# Patient Record
Sex: Female | Born: 1965 | Race: White | Hispanic: No | Smoking: Never smoker
Health system: Southern US, Community
[De-identification: ages and names within clinical notes are randomized; demographics above are authoritative.]

## PROBLEM LIST (undated history)

## (undated) DIAGNOSIS — D509 Iron deficiency anemia, unspecified: Secondary | ICD-10-CM

## (undated) DIAGNOSIS — N184 Chronic kidney disease, stage 4 (severe): Secondary | ICD-10-CM

## (undated) DIAGNOSIS — T8859XA Other complications of anesthesia, initial encounter: Secondary | ICD-10-CM

## (undated) DIAGNOSIS — Z79899 Other long term (current) drug therapy: Secondary | ICD-10-CM

## (undated) DIAGNOSIS — E1169 Type 2 diabetes mellitus with other specified complication: Secondary | ICD-10-CM

## (undated) DIAGNOSIS — IMO0002 Reserved for concepts with insufficient information to code with codable children: Secondary | ICD-10-CM

## (undated) DIAGNOSIS — Z9289 Personal history of other medical treatment: Secondary | ICD-10-CM

## (undated) DIAGNOSIS — I501 Left ventricular failure: Secondary | ICD-10-CM

## (undated) DIAGNOSIS — K589 Irritable bowel syndrome without diarrhea: Secondary | ICD-10-CM

## (undated) DIAGNOSIS — R509 Fever, unspecified: Secondary | ICD-10-CM

## (undated) DIAGNOSIS — L03119 Cellulitis of unspecified part of limb: Secondary | ICD-10-CM

## (undated) DIAGNOSIS — N3000 Acute cystitis without hematuria: Secondary | ICD-10-CM

## (undated) DIAGNOSIS — R011 Cardiac murmur, unspecified: Secondary | ICD-10-CM

## (undated) DIAGNOSIS — G909 Disorder of the autonomic nervous system, unspecified: Secondary | ICD-10-CM

## (undated) DIAGNOSIS — F411 Generalized anxiety disorder: Secondary | ICD-10-CM

## (undated) DIAGNOSIS — R609 Edema, unspecified: Secondary | ICD-10-CM

## (undated) DIAGNOSIS — L02619 Cutaneous abscess of unspecified foot: Secondary | ICD-10-CM

## (undated) DIAGNOSIS — E119 Type 2 diabetes mellitus without complications: Secondary | ICD-10-CM

## (undated) DIAGNOSIS — G6181 Chronic inflammatory demyelinating polyneuritis: Secondary | ICD-10-CM

## (undated) DIAGNOSIS — K219 Gastro-esophageal reflux disease without esophagitis: Secondary | ICD-10-CM

## (undated) DIAGNOSIS — F502 Bulimia nervosa, unspecified: Secondary | ICD-10-CM

## (undated) DIAGNOSIS — E785 Hyperlipidemia, unspecified: Secondary | ICD-10-CM

## (undated) DIAGNOSIS — T4145XA Adverse effect of unspecified anesthetic, initial encounter: Secondary | ICD-10-CM

## (undated) DIAGNOSIS — R197 Diarrhea, unspecified: Secondary | ICD-10-CM

## (undated) DIAGNOSIS — I270 Primary pulmonary hypertension: Secondary | ICD-10-CM

## (undated) DIAGNOSIS — J189 Pneumonia, unspecified organism: Secondary | ICD-10-CM

## (undated) DIAGNOSIS — R300 Dysuria: Secondary | ICD-10-CM

## (undated) DIAGNOSIS — G2581 Restless legs syndrome: Secondary | ICD-10-CM

## (undated) DIAGNOSIS — M6281 Muscle weakness (generalized): Secondary | ICD-10-CM

## (undated) DIAGNOSIS — F341 Dysthymic disorder: Secondary | ICD-10-CM

## (undated) DIAGNOSIS — E11319 Type 2 diabetes mellitus with unspecified diabetic retinopathy without macular edema: Secondary | ICD-10-CM

## (undated) DIAGNOSIS — N200 Calculus of kidney: Secondary | ICD-10-CM

## (undated) DIAGNOSIS — R002 Palpitations: Secondary | ICD-10-CM

## (undated) DIAGNOSIS — I10 Essential (primary) hypertension: Secondary | ICD-10-CM

## (undated) HISTORY — DX: Left ventricular failure, unspecified: I50.1

## (undated) HISTORY — DX: Primary pulmonary hypertension: I27.0

## (undated) HISTORY — DX: Gastro-esophageal reflux disease without esophagitis: K21.9

## (undated) HISTORY — DX: Restless legs syndrome: G25.81

## (undated) HISTORY — DX: Type 2 diabetes mellitus with other specified complication: E11.69

## (undated) HISTORY — DX: Dysuria: R30.0

## (undated) HISTORY — DX: Reserved for concepts with insufficient information to code with codable children: IMO0002

## (undated) HISTORY — DX: Dysthymic disorder: F34.1

## (undated) HISTORY — DX: Hyperlipidemia, unspecified: E78.5

## (undated) HISTORY — DX: Disorders of magnesium metabolism, unspecified: E83.40

## (undated) HISTORY — DX: Other long term (current) drug therapy: Z79.899

## (undated) HISTORY — DX: Muscle weakness (generalized): M62.81

## (undated) HISTORY — DX: Disorder of the autonomic nervous system, unspecified: G90.9

## (undated) HISTORY — DX: Type 2 diabetes mellitus without complications: E11.9

## (undated) HISTORY — DX: Palpitations: R00.2

## (undated) HISTORY — PX: DILATION AND CURETTAGE OF UTERUS: SHX78

## (undated) HISTORY — DX: Irritable bowel syndrome, unspecified: K58.9

## (undated) HISTORY — PX: TONSILLECTOMY AND ADENOIDECTOMY: SUR1326

## (undated) HISTORY — DX: Bulimia nervosa, unspecified: F50.20

## (undated) HISTORY — DX: Generalized anxiety disorder: F41.1

## (undated) HISTORY — PX: APPENDECTOMY: SHX54

## (undated) HISTORY — PX: EYE SURGERY: SHX253

## (undated) HISTORY — DX: Calculus of kidney: N20.0

## (undated) HISTORY — DX: Edema, unspecified: R60.9

## (undated) HISTORY — DX: Diarrhea, unspecified: R19.7

## (undated) HISTORY — DX: Acute cystitis without hematuria: N30.00

## (undated) HISTORY — DX: Type 2 diabetes mellitus with unspecified diabetic retinopathy without macular edema: E11.319

## (undated) HISTORY — DX: Essential (primary) hypertension: I10

## (undated) HISTORY — DX: Chronic inflammatory demyelinating polyneuritis: G61.81

## (undated) HISTORY — DX: Bulimia nervosa: F50.2

---
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
## 1997-04-09 ENCOUNTER — Inpatient Hospital Stay (HOSPITAL_COMMUNITY): Admission: AD | Admit: 1997-04-09 | Discharge: 1997-04-13 | Payer: Self-pay | Admitting: Obstetrics and Gynecology

## 1997-04-13 ENCOUNTER — Encounter (HOSPITAL_COMMUNITY): Admission: RE | Admit: 1997-04-13 | Discharge: 1997-07-12 | Payer: Self-pay | Admitting: Obstetrics and Gynecology

## 1997-08-20 ENCOUNTER — Inpatient Hospital Stay (HOSPITAL_COMMUNITY): Admission: AD | Admit: 1997-08-20 | Discharge: 1997-08-20 | Payer: Self-pay | Admitting: Obstetrics and Gynecology

## 1997-09-01 ENCOUNTER — Inpatient Hospital Stay (HOSPITAL_COMMUNITY): Admission: AD | Admit: 1997-09-01 | Discharge: 1997-09-01 | Payer: Self-pay | Admitting: *Deleted

## 1997-12-01 ENCOUNTER — Ambulatory Visit (HOSPITAL_COMMUNITY): Admission: RE | Admit: 1997-12-01 | Discharge: 1997-12-01 | Payer: Self-pay | Admitting: Obstetrics & Gynecology

## 1998-01-25 ENCOUNTER — Emergency Department (HOSPITAL_COMMUNITY): Admission: EM | Admit: 1998-01-25 | Discharge: 1998-01-25 | Payer: Self-pay | Admitting: Emergency Medicine

## 1998-07-22 ENCOUNTER — Observation Stay (HOSPITAL_COMMUNITY): Admission: AD | Admit: 1998-07-22 | Discharge: 1998-07-23 | Payer: Self-pay | Admitting: Family Medicine

## 1998-11-08 ENCOUNTER — Ambulatory Visit (HOSPITAL_COMMUNITY): Admission: AD | Admit: 1998-11-08 | Discharge: 1998-11-08 | Payer: Self-pay | Admitting: *Deleted

## 1998-11-08 ENCOUNTER — Encounter: Payer: Self-pay | Admitting: Obstetrics and Gynecology

## 1998-11-08 ENCOUNTER — Encounter (INDEPENDENT_AMBULATORY_CARE_PROVIDER_SITE_OTHER): Payer: Self-pay

## 1999-05-28 ENCOUNTER — Other Ambulatory Visit: Admission: RE | Admit: 1999-05-28 | Discharge: 1999-05-28 | Payer: Self-pay | Admitting: Obstetrics and Gynecology

## 1999-06-09 ENCOUNTER — Ambulatory Visit (HOSPITAL_COMMUNITY): Admission: RE | Admit: 1999-06-09 | Discharge: 1999-06-09 | Payer: Self-pay | Admitting: Obstetrics and Gynecology

## 1999-06-26 ENCOUNTER — Ambulatory Visit (HOSPITAL_COMMUNITY): Admission: RE | Admit: 1999-06-26 | Discharge: 1999-06-26 | Payer: Self-pay | Admitting: *Deleted

## 1999-06-26 ENCOUNTER — Encounter: Payer: Self-pay | Admitting: *Deleted

## 1999-08-13 ENCOUNTER — Encounter: Payer: Self-pay | Admitting: Obstetrics and Gynecology

## 1999-08-13 ENCOUNTER — Ambulatory Visit (HOSPITAL_COMMUNITY): Admission: RE | Admit: 1999-08-13 | Discharge: 1999-08-13 | Payer: Self-pay | Admitting: Obstetrics and Gynecology

## 1999-09-18 ENCOUNTER — Ambulatory Visit (HOSPITAL_COMMUNITY): Admission: RE | Admit: 1999-09-18 | Discharge: 1999-09-18 | Payer: Self-pay | Admitting: *Deleted

## 1999-09-18 ENCOUNTER — Encounter: Admission: RE | Admit: 1999-09-18 | Discharge: 1999-09-18 | Payer: Self-pay | Admitting: Obstetrics

## 1999-09-19 ENCOUNTER — Encounter: Admission: RE | Admit: 1999-09-19 | Discharge: 1999-12-18 | Payer: Self-pay | Admitting: Obstetrics

## 1999-09-23 ENCOUNTER — Inpatient Hospital Stay (HOSPITAL_COMMUNITY): Admission: RE | Admit: 1999-09-23 | Discharge: 1999-09-23 | Payer: Self-pay | Admitting: *Deleted

## 1999-09-24 ENCOUNTER — Inpatient Hospital Stay (HOSPITAL_COMMUNITY): Admission: AD | Admit: 1999-09-24 | Discharge: 1999-09-24 | Payer: Self-pay | Admitting: Obstetrics

## 1999-10-07 ENCOUNTER — Encounter (HOSPITAL_COMMUNITY): Admission: RE | Admit: 1999-10-07 | Discharge: 1999-11-10 | Payer: Self-pay | Admitting: *Deleted

## 1999-10-10 ENCOUNTER — Encounter: Payer: Self-pay | Admitting: *Deleted

## 1999-10-31 ENCOUNTER — Inpatient Hospital Stay (HOSPITAL_COMMUNITY): Admission: AD | Admit: 1999-10-31 | Discharge: 1999-11-03 | Payer: Self-pay | Admitting: *Deleted

## 1999-10-31 ENCOUNTER — Encounter: Payer: Self-pay | Admitting: *Deleted

## 1999-11-03 ENCOUNTER — Encounter: Payer: Self-pay | Admitting: *Deleted

## 1999-11-08 ENCOUNTER — Encounter (INDEPENDENT_AMBULATORY_CARE_PROVIDER_SITE_OTHER): Payer: Self-pay | Admitting: Specialist

## 1999-11-08 ENCOUNTER — Inpatient Hospital Stay (HOSPITAL_COMMUNITY): Admission: AD | Admit: 1999-11-08 | Discharge: 1999-11-12 | Payer: Self-pay | Admitting: *Deleted

## 1999-11-13 ENCOUNTER — Encounter (HOSPITAL_COMMUNITY): Admission: RE | Admit: 1999-11-13 | Discharge: 1999-12-09 | Payer: Self-pay | Admitting: *Deleted

## 1999-11-17 ENCOUNTER — Inpatient Hospital Stay (HOSPITAL_COMMUNITY): Admission: AD | Admit: 1999-11-17 | Discharge: 1999-11-17 | Payer: Self-pay | Admitting: Obstetrics & Gynecology

## 1999-12-30 ENCOUNTER — Encounter (INDEPENDENT_AMBULATORY_CARE_PROVIDER_SITE_OTHER): Payer: Self-pay

## 1999-12-30 ENCOUNTER — Inpatient Hospital Stay (HOSPITAL_COMMUNITY): Admission: AD | Admit: 1999-12-30 | Discharge: 1999-12-30 | Payer: Self-pay | Admitting: *Deleted

## 2000-03-09 ENCOUNTER — Encounter (INDEPENDENT_AMBULATORY_CARE_PROVIDER_SITE_OTHER): Payer: Self-pay | Admitting: Specialist

## 2000-03-09 ENCOUNTER — Inpatient Hospital Stay (HOSPITAL_COMMUNITY): Admission: AD | Admit: 2000-03-09 | Discharge: 2000-03-09 | Payer: Self-pay | Admitting: *Deleted

## 2000-03-11 ENCOUNTER — Inpatient Hospital Stay (HOSPITAL_COMMUNITY): Admission: AD | Admit: 2000-03-11 | Discharge: 2000-03-11 | Payer: Self-pay | Admitting: Obstetrics & Gynecology

## 2000-03-11 ENCOUNTER — Encounter: Payer: Self-pay | Admitting: Obstetrics & Gynecology

## 2002-11-02 ENCOUNTER — Encounter: Payer: Self-pay | Admitting: Emergency Medicine

## 2002-11-02 ENCOUNTER — Emergency Department (HOSPITAL_COMMUNITY): Admission: EM | Admit: 2002-11-02 | Discharge: 2002-11-02 | Payer: Self-pay | Admitting: Emergency Medicine

## 2003-03-16 ENCOUNTER — Ambulatory Visit (HOSPITAL_COMMUNITY): Admission: RE | Admit: 2003-03-16 | Discharge: 2003-03-16 | Payer: Self-pay | Admitting: Ophthalmology

## 2003-03-16 ENCOUNTER — Ambulatory Visit (HOSPITAL_BASED_OUTPATIENT_CLINIC_OR_DEPARTMENT_OTHER): Admission: RE | Admit: 2003-03-16 | Discharge: 2003-03-16 | Payer: Self-pay | Admitting: Ophthalmology

## 2003-05-18 ENCOUNTER — Ambulatory Visit (HOSPITAL_BASED_OUTPATIENT_CLINIC_OR_DEPARTMENT_OTHER): Admission: RE | Admit: 2003-05-18 | Discharge: 2003-05-18 | Payer: Self-pay | Admitting: Ophthalmology

## 2003-08-14 ENCOUNTER — Other Ambulatory Visit: Admission: RE | Admit: 2003-08-14 | Discharge: 2003-08-14 | Payer: Self-pay | Admitting: Family Medicine

## 2003-08-27 ENCOUNTER — Encounter: Admission: RE | Admit: 2003-08-27 | Discharge: 2003-08-27 | Payer: Self-pay | Admitting: Family Medicine

## 2003-11-22 ENCOUNTER — Encounter (INDEPENDENT_AMBULATORY_CARE_PROVIDER_SITE_OTHER): Payer: Self-pay | Admitting: Specialist

## 2003-11-22 ENCOUNTER — Inpatient Hospital Stay (HOSPITAL_COMMUNITY): Admission: AD | Admit: 2003-11-22 | Discharge: 2003-11-24 | Payer: Self-pay | Admitting: Obstetrics & Gynecology

## 2004-04-02 ENCOUNTER — Emergency Department (HOSPITAL_COMMUNITY): Admission: EM | Admit: 2004-04-02 | Discharge: 2004-04-02 | Payer: Self-pay | Admitting: Family Medicine

## 2005-05-21 ENCOUNTER — Ambulatory Visit: Payer: Self-pay | Admitting: Internal Medicine

## 2005-05-22 ENCOUNTER — Ambulatory Visit: Payer: Self-pay | Admitting: Endocrinology

## 2005-06-01 ENCOUNTER — Ambulatory Visit: Payer: Self-pay | Admitting: Internal Medicine

## 2005-06-10 ENCOUNTER — Ambulatory Visit: Payer: Self-pay | Admitting: Internal Medicine

## 2005-06-12 ENCOUNTER — Ambulatory Visit: Payer: Self-pay | Admitting: Cardiology

## 2005-09-03 ENCOUNTER — Ambulatory Visit (HOSPITAL_COMMUNITY): Admission: RE | Admit: 2005-09-03 | Discharge: 2005-09-03 | Payer: Self-pay | Admitting: Family Medicine

## 2005-09-09 ENCOUNTER — Ambulatory Visit: Payer: Self-pay | Admitting: Endocrinology

## 2005-09-09 ENCOUNTER — Ambulatory Visit: Payer: Self-pay | Admitting: Internal Medicine

## 2005-10-06 ENCOUNTER — Ambulatory Visit: Payer: Self-pay | Admitting: Internal Medicine

## 2005-10-08 ENCOUNTER — Ambulatory Visit: Payer: Self-pay | Admitting: Internal Medicine

## 2005-12-16 ENCOUNTER — Emergency Department (HOSPITAL_COMMUNITY): Admission: EM | Admit: 2005-12-16 | Discharge: 2005-12-16 | Payer: Self-pay | Admitting: Family Medicine

## 2005-12-17 ENCOUNTER — Emergency Department (HOSPITAL_COMMUNITY): Admission: EM | Admit: 2005-12-17 | Discharge: 2005-12-17 | Payer: Self-pay | Admitting: Family Medicine

## 2006-08-08 ENCOUNTER — Emergency Department (HOSPITAL_COMMUNITY): Admission: EM | Admit: 2006-08-08 | Discharge: 2006-08-08 | Payer: Self-pay | Admitting: Emergency Medicine

## 2006-09-08 ENCOUNTER — Encounter: Payer: Self-pay | Admitting: *Deleted

## 2006-09-08 DIAGNOSIS — F3289 Other specified depressive episodes: Secondary | ICD-10-CM | POA: Insufficient documentation

## 2006-09-08 DIAGNOSIS — F32A Depression, unspecified: Secondary | ICD-10-CM | POA: Insufficient documentation

## 2006-09-08 DIAGNOSIS — K8020 Calculus of gallbladder without cholecystitis without obstruction: Secondary | ICD-10-CM | POA: Insufficient documentation

## 2006-09-08 DIAGNOSIS — F329 Major depressive disorder, single episode, unspecified: Secondary | ICD-10-CM | POA: Insufficient documentation

## 2006-09-08 DIAGNOSIS — G629 Polyneuropathy, unspecified: Secondary | ICD-10-CM | POA: Insufficient documentation

## 2006-12-02 ENCOUNTER — Ambulatory Visit: Payer: Self-pay | Admitting: Vascular Surgery

## 2006-12-02 ENCOUNTER — Encounter (INDEPENDENT_AMBULATORY_CARE_PROVIDER_SITE_OTHER): Payer: Self-pay | Admitting: Family Medicine

## 2006-12-02 ENCOUNTER — Ambulatory Visit (HOSPITAL_COMMUNITY): Admission: RE | Admit: 2006-12-02 | Discharge: 2006-12-02 | Payer: Self-pay | Admitting: Family Medicine

## 2007-04-04 ENCOUNTER — Ambulatory Visit: Payer: Self-pay | Admitting: Internal Medicine

## 2007-04-04 ENCOUNTER — Telehealth: Payer: Self-pay | Admitting: Internal Medicine

## 2007-04-04 DIAGNOSIS — R609 Edema, unspecified: Secondary | ICD-10-CM | POA: Insufficient documentation

## 2007-04-04 DIAGNOSIS — E1165 Type 2 diabetes mellitus with hyperglycemia: Secondary | ICD-10-CM | POA: Insufficient documentation

## 2007-04-04 DIAGNOSIS — IMO0002 Reserved for concepts with insufficient information to code with codable children: Secondary | ICD-10-CM | POA: Insufficient documentation

## 2007-04-04 DIAGNOSIS — I10 Essential (primary) hypertension: Secondary | ICD-10-CM | POA: Insufficient documentation

## 2007-04-12 LAB — CONVERTED CEMR LAB
ALT: 24 units/L (ref 0–35)
Albumin: 3.7 g/dL (ref 3.5–5.2)
Alkaline Phosphatase: 86 units/L (ref 39–117)
Basophils Absolute: 0.1 10*3/uL (ref 0.0–0.1)
Basophils Relative: 1.1 % — ABNORMAL HIGH (ref 0.0–1.0)
Calcium: 9 mg/dL (ref 8.4–10.5)
Chloride: 95 meq/L — ABNORMAL LOW (ref 96–112)
GFR calc Af Amer: 173 mL/min
Glucose, Bld: 410 mg/dL — ABNORMAL HIGH (ref 70–99)
HCT: 38.6 % (ref 36.0–46.0)
Hemoglobin: 12.2 g/dL (ref 12.0–15.0)
Hgb A1c MFr Bld: 17.4 % — ABNORMAL HIGH (ref 4.6–6.0)
MCHC: 31.7 g/dL (ref 30.0–36.0)
MCV: 79.5 fL (ref 78.0–100.0)
Microalb Creat Ratio: 240.2 mg/g — ABNORMAL HIGH (ref 0.0–30.0)
Monocytes Absolute: 0.4 10*3/uL (ref 0.2–0.7)
Neutro Abs: 4.6 10*3/uL (ref 1.4–7.7)
Neutrophils Relative %: 64.8 % (ref 43.0–77.0)
Platelets: 210 10*3/uL (ref 150–400)
Potassium: 4.6 meq/L (ref 3.5–5.1)
RDW: 16.4 % — ABNORMAL HIGH (ref 11.5–14.6)
Sodium: 133 meq/L — ABNORMAL LOW (ref 135–145)
WBC: 7 10*3/uL (ref 4.5–10.5)

## 2007-07-05 ENCOUNTER — Other Ambulatory Visit: Admission: RE | Admit: 2007-07-05 | Discharge: 2007-07-05 | Payer: Self-pay | Admitting: Gynecology

## 2007-07-25 ENCOUNTER — Encounter: Admission: RE | Admit: 2007-07-25 | Discharge: 2007-07-25 | Payer: Self-pay | Admitting: Gynecology

## 2007-09-22 ENCOUNTER — Emergency Department (HOSPITAL_COMMUNITY): Admission: EM | Admit: 2007-09-22 | Discharge: 2007-09-22 | Payer: Self-pay | Admitting: Family Medicine

## 2007-12-24 ENCOUNTER — Emergency Department (HOSPITAL_COMMUNITY): Admission: EM | Admit: 2007-12-24 | Discharge: 2007-12-24 | Payer: Self-pay | Admitting: Emergency Medicine

## 2008-01-27 DIAGNOSIS — J189 Pneumonia, unspecified organism: Secondary | ICD-10-CM

## 2008-01-27 HISTORY — DX: Pneumonia, unspecified organism: J18.9

## 2008-01-27 HISTORY — PX: PARS PLANA VITRECTOMY: SHX2166

## 2008-01-27 HISTORY — PX: INCISION AND DRAINAGE OF WOUND: SHX1803

## 2008-02-03 ENCOUNTER — Encounter (HOSPITAL_BASED_OUTPATIENT_CLINIC_OR_DEPARTMENT_OTHER): Admission: RE | Admit: 2008-02-03 | Discharge: 2008-04-30 | Payer: Self-pay | Admitting: Internal Medicine

## 2008-02-11 ENCOUNTER — Ambulatory Visit (HOSPITAL_COMMUNITY): Admission: RE | Admit: 2008-02-11 | Discharge: 2008-02-11 | Payer: Self-pay | Admitting: Internal Medicine

## 2008-03-27 ENCOUNTER — Ambulatory Visit: Payer: Self-pay | Admitting: Infectious Disease

## 2008-03-27 ENCOUNTER — Inpatient Hospital Stay (HOSPITAL_COMMUNITY): Admission: EM | Admit: 2008-03-27 | Discharge: 2008-03-29 | Payer: Self-pay | Admitting: Emergency Medicine

## 2008-04-04 ENCOUNTER — Emergency Department (HOSPITAL_COMMUNITY): Admission: EM | Admit: 2008-04-04 | Discharge: 2008-04-04 | Payer: Self-pay | Admitting: Emergency Medicine

## 2008-04-14 ENCOUNTER — Emergency Department (HOSPITAL_COMMUNITY): Admission: EM | Admit: 2008-04-14 | Discharge: 2008-04-15 | Payer: Self-pay | Admitting: Emergency Medicine

## 2008-04-15 ENCOUNTER — Emergency Department (HOSPITAL_COMMUNITY): Admission: EM | Admit: 2008-04-15 | Discharge: 2008-04-15 | Payer: Self-pay | Admitting: Emergency Medicine

## 2008-05-01 ENCOUNTER — Emergency Department (HOSPITAL_COMMUNITY): Admission: EM | Admit: 2008-05-01 | Discharge: 2008-05-01 | Payer: Self-pay | Admitting: *Deleted

## 2008-05-04 ENCOUNTER — Emergency Department (HOSPITAL_COMMUNITY): Admission: EM | Admit: 2008-05-04 | Discharge: 2008-05-04 | Payer: Self-pay | Admitting: Emergency Medicine

## 2008-06-06 ENCOUNTER — Encounter: Admission: RE | Admit: 2008-06-06 | Discharge: 2008-06-06 | Payer: Self-pay | Admitting: Orthopedic Surgery

## 2008-10-30 ENCOUNTER — Emergency Department (HOSPITAL_COMMUNITY): Admission: EM | Admit: 2008-10-30 | Discharge: 2008-10-30 | Payer: Self-pay | Admitting: Emergency Medicine

## 2009-01-28 ENCOUNTER — Inpatient Hospital Stay (HOSPITAL_COMMUNITY): Admission: EM | Admit: 2009-01-28 | Discharge: 2009-01-30 | Payer: Self-pay | Admitting: Emergency Medicine

## 2009-03-26 ENCOUNTER — Emergency Department (HOSPITAL_COMMUNITY): Admission: EM | Admit: 2009-03-26 | Discharge: 2009-03-26 | Payer: Self-pay | Admitting: Emergency Medicine

## 2009-05-26 IMAGING — CT CT ANGIO CHEST
2 of 6 series · 18 of 36 positions shown · IV contrast (agent unspecified)
Comparison: Chest radiograph 05/04/2008

CLINICAL DATA: Shortness of breath

CT ANGIOGRAPHY CHEST
TECHNIQUE: Multidetector CT imaging of the chest was performed
using the standard protocol during bolus administration of
intravenous contrast. Multiplanar CT image reconstructions
including MIPs were obtained to evaluate the vascular anatomy.
Contrast: 80 ml [id number]

[Series 2: pe · axial · 0.64mm/px · z∈[-251,-51]mm · 17 of 181 slices shown]
[im 11/181  lung]
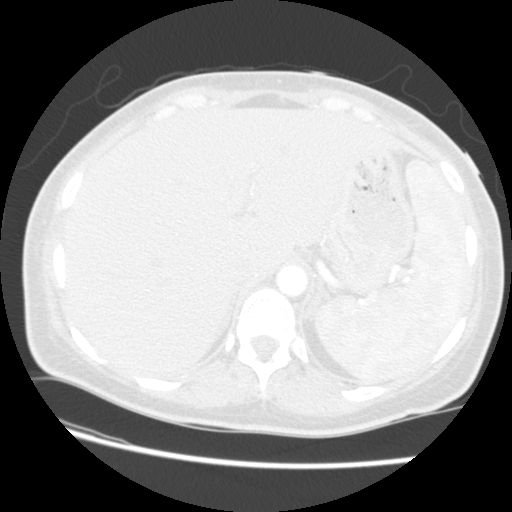
[im 21/181  mediastinal]
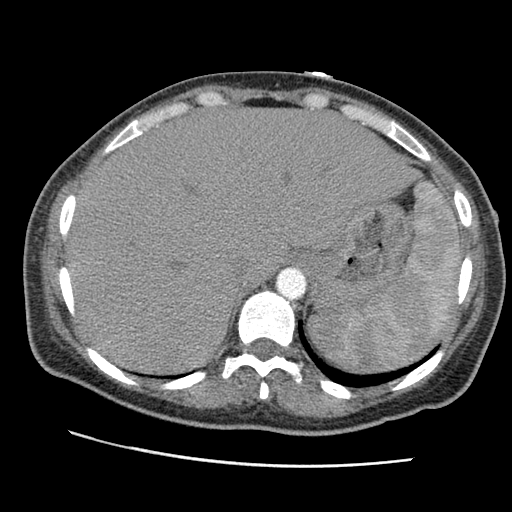
[im 31/181  lung]
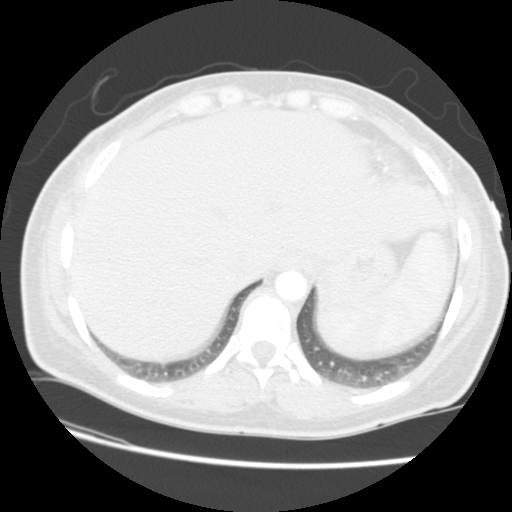
[im 41/181  mediastinal]
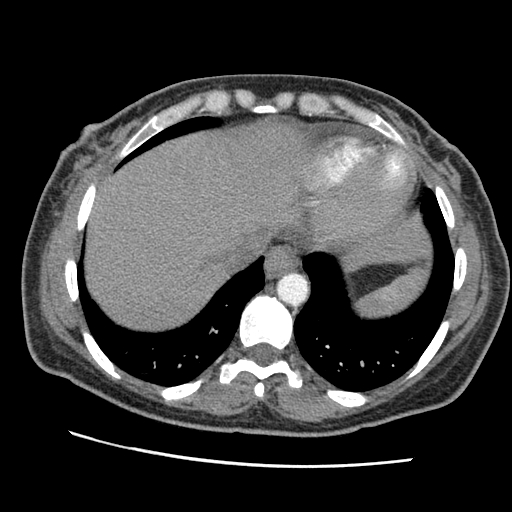
[im 51/181  lung]
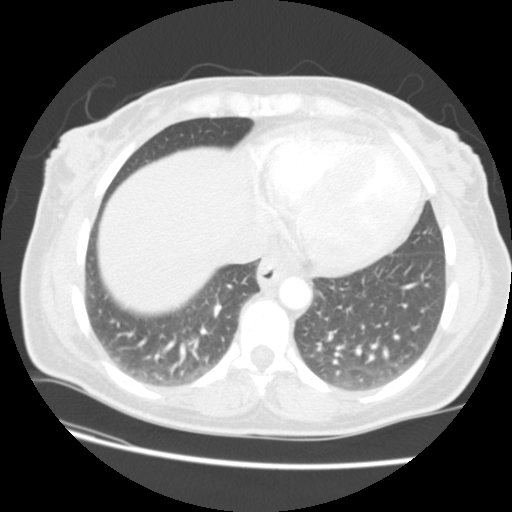
[im 61/181  mediastinal]
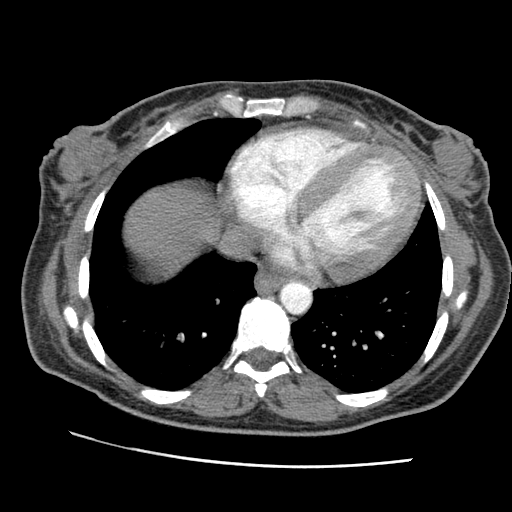
[im 71/181  lung]
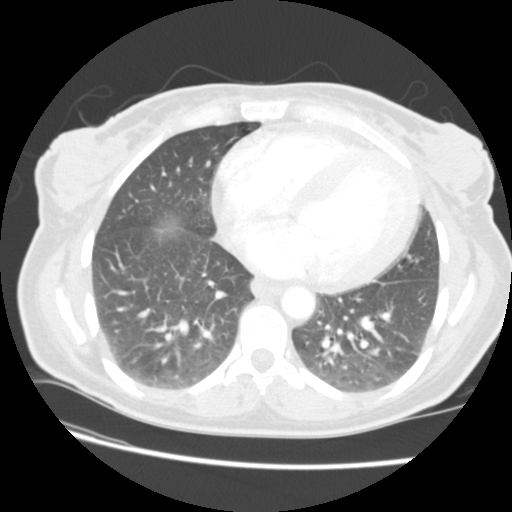
[im 81/181  mediastinal]
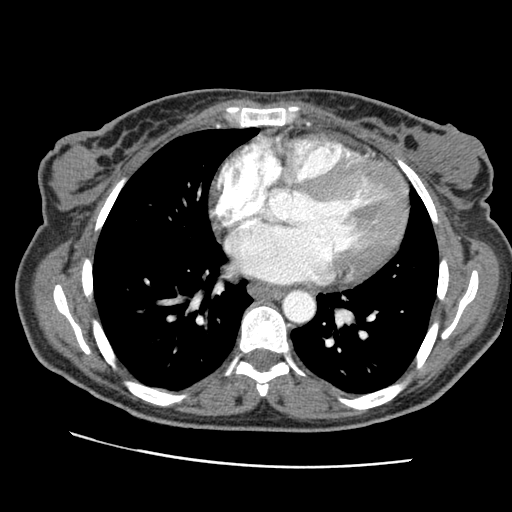
[im 91/181  lung]
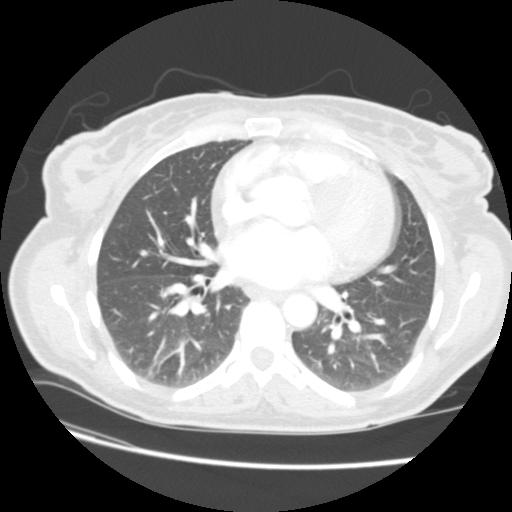
[im 101/181  mediastinal]
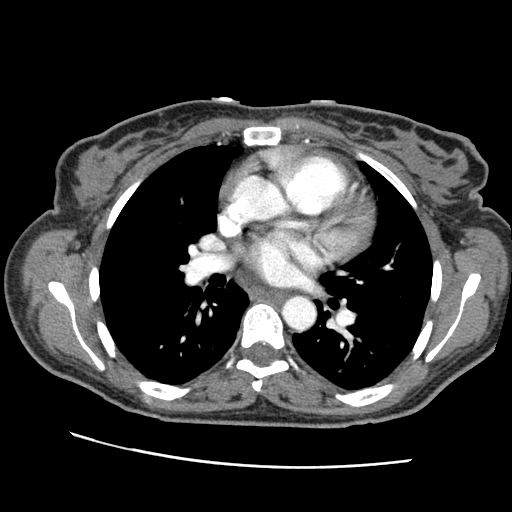
[im 111/181  lung]
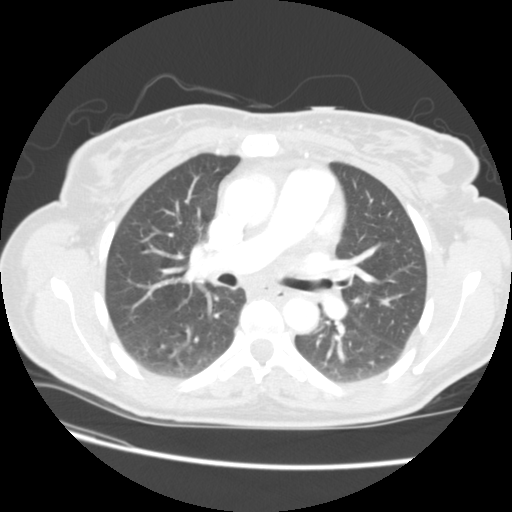
[im 121/181  mediastinal]
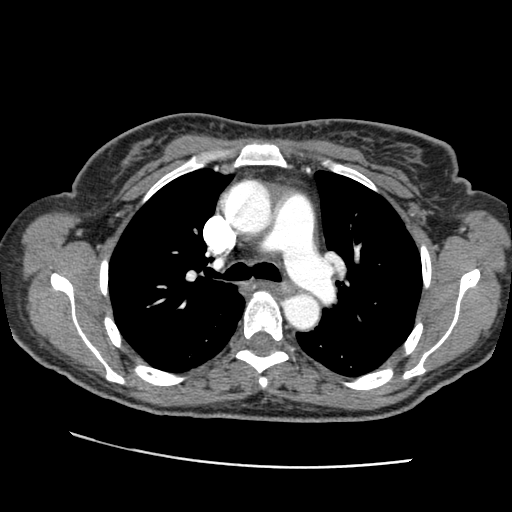
[im 131/181  lung]
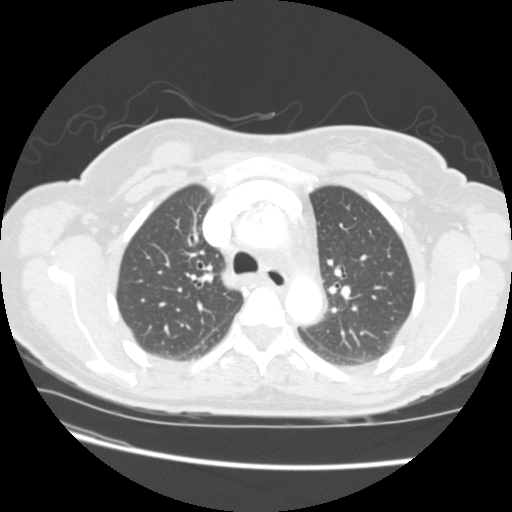
[im 141/181  mediastinal]
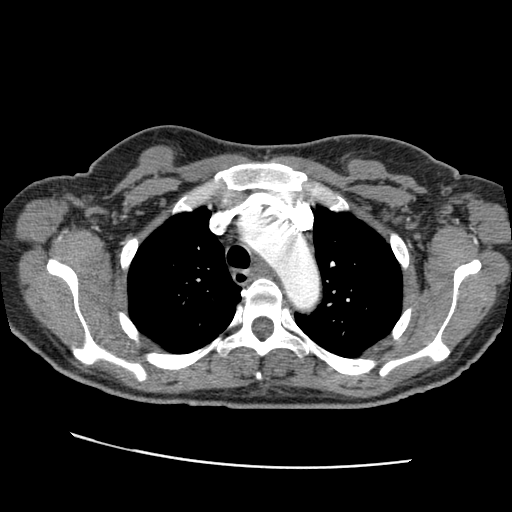
[im 151/181  lung]
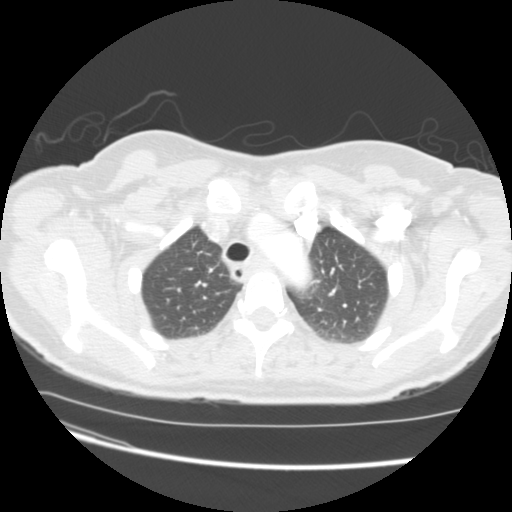
[im 161/181  mediastinal]
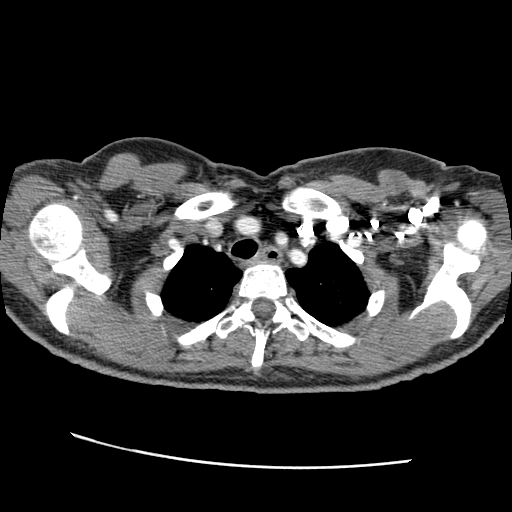
[im 171/181  lung]
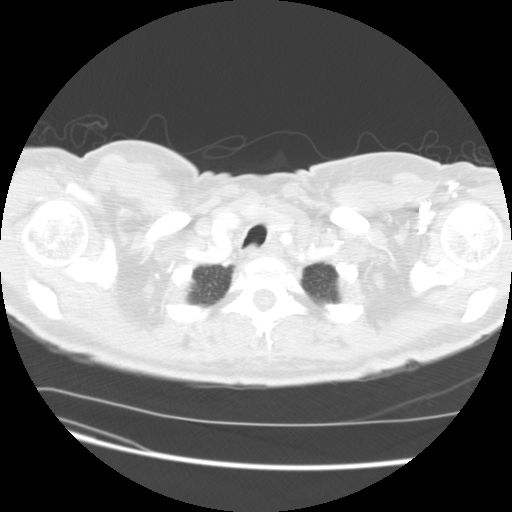

[Series 202: cor · coronal · 0.64mm/px · 1 of 111 slices shown]
[im 56/111  mediastinal]
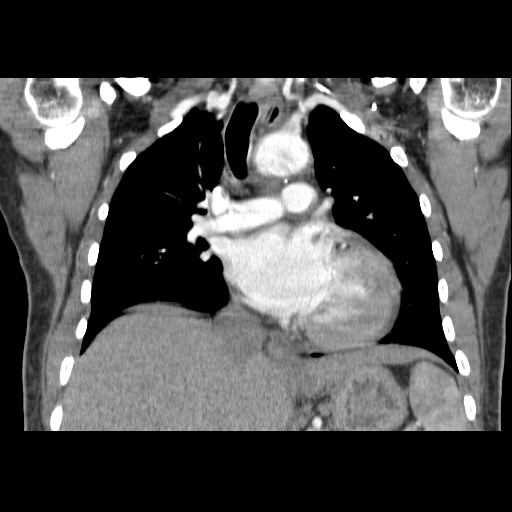

[18 of 36 positions shown; findings below may reference images not displayed]

FINDINGS: Dependent basilar subsegmental atelectasis.  Negative
for edema, infiltrates or effusions.  No pericardial effusions
identified.  The coronary arteries are patent.

The pulmonary vasculature enhances normally without filling defects
or truncation to suggest embolus.  The thoracic aorta has a normal
appearance without dissection.

 Review of the MIP images confirms the above findings.
IMPRESSION: 1.  Negative for pulmonary embolus or aortic dissection.
2.  No acute pulmonary process.

## 2009-12-16 ENCOUNTER — Emergency Department (HOSPITAL_COMMUNITY): Admission: EM | Admit: 2009-12-16 | Discharge: 2009-12-16 | Payer: Self-pay | Admitting: Emergency Medicine

## 2009-12-29 ENCOUNTER — Inpatient Hospital Stay (HOSPITAL_COMMUNITY)
Admission: EM | Admit: 2009-12-29 | Discharge: 2009-12-30 | Payer: Self-pay | Source: Home / Self Care | Attending: Internal Medicine | Admitting: Internal Medicine

## 2009-12-29 ENCOUNTER — Encounter (INDEPENDENT_AMBULATORY_CARE_PROVIDER_SITE_OTHER): Payer: Self-pay | Admitting: Internal Medicine

## 2010-02-16 ENCOUNTER — Encounter: Payer: Self-pay | Admitting: Gynecology

## 2010-02-16 ENCOUNTER — Encounter: Payer: Self-pay | Admitting: Orthopedic Surgery

## 2010-02-17 ENCOUNTER — Encounter: Payer: Self-pay | Admitting: Orthopedic Surgery

## 2010-03-29 ENCOUNTER — Encounter: Payer: Self-pay | Admitting: Cardiovascular Disease

## 2010-04-07 LAB — COMPREHENSIVE METABOLIC PANEL
ALT: 16 U/L (ref 0–35)
AST: 16 U/L (ref 0–37)
Albumin: 2.9 g/dL — ABNORMAL LOW (ref 3.5–5.2)
BUN: 32 mg/dL — ABNORMAL HIGH (ref 6–23)
CO2: 26 mEq/L (ref 19–32)
CO2: 29 mEq/L (ref 19–32)
Calcium: 8.6 mg/dL (ref 8.4–10.5)
Calcium: 8.6 mg/dL (ref 8.4–10.5)
Chloride: 101 mEq/L (ref 96–112)
Creatinine, Ser: 1 mg/dL (ref 0.4–1.2)
Creatinine, Ser: 1.08 mg/dL (ref 0.4–1.2)
GFR calc Af Amer: >60 mL/min (ref >60)
GFR calc Af Amer: >60 mL/min (ref >60)
GFR calc non Af Amer: 55 mL/min — ABNORMAL LOW (ref >60)
GFR calc non Af Amer: 60 mL/min — ABNORMAL LOW (ref >60)
Glucose, Bld: 155 mg/dL — ABNORMAL HIGH (ref 70–99)
Sodium: 137 mEq/L (ref 135–145)
Total Bilirubin: 0.3 mg/dL (ref 0.3–1.2)

## 2010-04-07 LAB — CROSSMATCH
ABO/RH(D): O POS
Donor AG Type: NEGATIVE
PT AG Type: NEGATIVE
Unit division: 0

## 2010-04-07 LAB — GLUCOSE, CAPILLARY
Glucose-Capillary: 168 mg/dL — ABNORMAL HIGH (ref 70–99)
Glucose-Capillary: 176 mg/dL — ABNORMAL HIGH (ref 70–99)
Glucose-Capillary: 211 mg/dL — ABNORMAL HIGH (ref 70–99)
Glucose-Capillary: 82 mg/dL (ref 70–99)

## 2010-04-07 LAB — HEPATIC FUNCTION PANEL
Alkaline Phosphatase: 93 U/L (ref 39–117)
Total Protein: 6.3 g/dL (ref 6.0–8.3)

## 2010-04-07 LAB — CBC
HCT: 21.6 % — ABNORMAL LOW (ref 36.0–46.0)
Hemoglobin: 6.6 g/dL — CL (ref 12.0–15.0)
Hemoglobin: 8.4 g/dL — ABNORMAL LOW (ref 12.0–15.0)
MCH: 24.9 pg — ABNORMAL LOW (ref 26.0–34.0)
MCHC: 30.6 g/dL (ref 30.0–36.0)
MCHC: 31.7 g/dL (ref 30.0–36.0)
MCV: 77.7 fL — ABNORMAL LOW (ref 78.0–100.0)
Platelets: 183 10*3/uL (ref 150–400)
RBC: 2.78 MIL/uL — ABNORMAL LOW (ref 3.87–5.11)
RBC: 3.38 MIL/uL — ABNORMAL LOW (ref 3.87–5.11)
RDW: 13.9 % (ref 11.5–15.5)

## 2010-04-07 LAB — RETICULOCYTES
Retic Count, Absolute: 45.4 10*3/uL (ref 19.0–186.0)
Retic Ct Pct: 1.6 % (ref 0.4–3.1)

## 2010-04-07 LAB — DIFFERENTIAL
Basophils Relative: 0 % (ref 0–1)
Eosinophils Absolute: 0.4 10*3/uL (ref 0.0–0.7)
Eosinophils Absolute: 0.5 10*3/uL (ref 0.0–0.7)
Eosinophils Relative: 6 % — ABNORMAL HIGH (ref 0–5)
Lymphocytes Relative: 25 % (ref 12–46)
Lymphs Abs: 1.9 10*3/uL (ref 0.7–4.0)
Monocytes Absolute: 0.5 10*3/uL (ref 0.1–1.0)
Monocytes Absolute: 0.7 10*3/uL (ref 0.1–1.0)
Neutro Abs: 4.2 10*3/uL (ref 1.7–7.7)

## 2010-04-07 LAB — HEMOGLOBIN A1C
Hgb A1c MFr Bld: 6.7 % — ABNORMAL HIGH (ref <5.7)
Mean Plasma Glucose: 146 mg/dL — ABNORMAL HIGH (ref <117)

## 2010-04-07 LAB — CK TOTAL AND CKMB (NOT AT ARMC): Total CK: 159 U/L (ref 7–177)

## 2010-04-07 LAB — FOLATE: Folate: >20 ng/mL

## 2010-04-07 LAB — MRSA PCR SCREENING: MRSA by PCR: POSITIVE — AB

## 2010-04-07 LAB — TSH: TSH: 4.207 u[IU]/mL (ref 0.350–4.500)

## 2010-04-07 LAB — LIPID PANEL
Cholesterol: 213 mg/dL — ABNORMAL HIGH (ref 0–200)
LDL Cholesterol: 140 mg/dL — ABNORMAL HIGH (ref 0–99)

## 2010-04-07 LAB — POCT I-STAT, CHEM 8
BUN: 37 mg/dL — ABNORMAL HIGH (ref 6–23)
Calcium, Ion: 1.09 mmol/L — ABNORMAL LOW (ref 1.12–1.32)
Creatinine, Ser: 0.9 mg/dL (ref 0.4–1.2)
TCO2: 23 mmol/L (ref 0–100)

## 2010-04-07 LAB — CARDIAC PANEL(CRET KIN+CKTOT+MB+TROPI): Troponin I: 0.02 ng/mL (ref 0.00–0.06)

## 2010-04-07 LAB — IRON AND TIBC
Saturation Ratios: 2 % — ABNORMAL LOW (ref 20–55)
UIBC: 398 ug/dL

## 2010-04-13 LAB — GLUCOSE, CAPILLARY
Glucose-Capillary: 115 mg/dL — ABNORMAL HIGH (ref 70–99)
Glucose-Capillary: 131 mg/dL — ABNORMAL HIGH (ref 70–99)
Glucose-Capillary: 155 mg/dL — ABNORMAL HIGH (ref 70–99)
Glucose-Capillary: 173 mg/dL — ABNORMAL HIGH (ref 70–99)
Glucose-Capillary: 233 mg/dL — ABNORMAL HIGH (ref 70–99)
Glucose-Capillary: 262 mg/dL — ABNORMAL HIGH (ref 70–99)
Glucose-Capillary: 293 mg/dL — ABNORMAL HIGH (ref 70–99)
Glucose-Capillary: 39 mg/dL — CL (ref 70–99)

## 2010-04-13 LAB — BASIC METABOLIC PANEL
CO2: 28 mEq/L (ref 19–32)
Calcium: 8.8 mg/dL (ref 8.4–10.5)
Chloride: 103 mEq/L (ref 96–112)
Creatinine, Ser: 1.07 mg/dL (ref 0.4–1.2)
Creatinine, Ser: 1.11 mg/dL (ref 0.4–1.2)
GFR calc Af Amer: >60 mL/min (ref >60)
GFR calc Af Amer: >60 mL/min (ref >60)
GFR calc non Af Amer: 55 mL/min — ABNORMAL LOW (ref >60)
Potassium: 3.4 mEq/L — ABNORMAL LOW (ref 3.5–5.1)
Sodium: 137 mEq/L (ref 135–145)

## 2010-04-13 LAB — CBC
HCT: 25.5 % — ABNORMAL LOW (ref 36.0–46.0)
MCHC: 31.5 g/dL (ref 30.0–36.0)
MCV: 78.7 fL (ref 78.0–100.0)
RBC: 3.24 MIL/uL — ABNORMAL LOW (ref 3.87–5.11)
RBC: 3.82 MIL/uL — ABNORMAL LOW (ref 3.87–5.11)
WBC: 10.1 10*3/uL (ref 4.0–10.5)

## 2010-04-13 LAB — RAPID URINE DRUG SCREEN, HOSP PERFORMED
Amphetamines: NOT DETECTED
Barbiturates: NOT DETECTED
Benzodiazepines: NOT DETECTED
Tetrahydrocannabinol: NOT DETECTED

## 2010-04-13 LAB — SALICYLATE LEVEL: Salicylate Lvl: <4 mg/dL (ref 2.8–20.0)

## 2010-04-13 LAB — DIFFERENTIAL
Basophils Absolute: 0.1 10*3/uL (ref 0.0–0.1)
Basophils Relative: 1 % (ref 0–1)
Eosinophils Absolute: 0.1 10*3/uL (ref 0.0–0.7)
Lymphocytes Relative: 8 % — ABNORMAL LOW (ref 12–46)
Monocytes Relative: 4 % (ref 3–12)
Neutrophils Relative %: 86 % — ABNORMAL HIGH (ref 43–77)

## 2010-04-13 LAB — ACETAMINOPHEN LEVEL: Acetaminophen (Tylenol), Serum: <10 ug/mL — ABNORMAL LOW (ref 10–30)

## 2010-04-13 LAB — URINALYSIS, ROUTINE W REFLEX MICROSCOPIC
Bilirubin Urine: NEGATIVE
Hgb urine dipstick: NEGATIVE
Nitrite: NEGATIVE
Protein, ur: 30 mg/dL — AB
Urobilinogen, UA: 0.2 mg/dL (ref 0.0–1.0)

## 2010-04-13 LAB — CULTURE, BLOOD (ROUTINE X 2)
Culture: NO GROWTH
Culture: NO GROWTH

## 2010-04-21 LAB — DIFFERENTIAL
Eosinophils Absolute: 0.2 10*3/uL (ref 0.0–0.7)
Eosinophils Relative: 3 % (ref 0–5)
Lymphocytes Relative: 14 % (ref 12–46)
Lymphs Abs: 1.1 10*3/uL (ref 0.7–4.0)
Monocytes Absolute: 0.4 10*3/uL (ref 0.1–1.0)
Monocytes Relative: 5 % (ref 3–12)

## 2010-04-21 LAB — POCT I-STAT, CHEM 8
BUN: 24 mg/dL — ABNORMAL HIGH (ref 6–23)
Creatinine, Ser: 0.4 mg/dL (ref 0.4–1.2)
Glucose, Bld: 80 mg/dL (ref 70–99)
Hemoglobin: 11.2 g/dL — ABNORMAL LOW (ref 12.0–15.0)
TCO2: 26 mmol/L (ref 0–100)

## 2010-04-21 LAB — URINALYSIS, ROUTINE W REFLEX MICROSCOPIC
Ketones, ur: NEGATIVE mg/dL
Leukocytes, UA: NEGATIVE
Nitrite: NEGATIVE
Protein, ur: 100 mg/dL — AB
Urobilinogen, UA: 0.2 mg/dL (ref 0.0–1.0)
pH: 7 (ref 5.0–8.0)

## 2010-04-21 LAB — GLUCOSE, CAPILLARY

## 2010-04-21 LAB — CBC
HCT: 32.9 % — ABNORMAL LOW (ref 36.0–46.0)
Hemoglobin: 11.2 g/dL — ABNORMAL LOW (ref 12.0–15.0)
MCV: 82.9 fL (ref 78.0–100.0)
RBC: 3.97 MIL/uL (ref 3.87–5.11)
WBC: 7.7 10*3/uL (ref 4.0–10.5)

## 2010-04-21 LAB — URINE MICROSCOPIC-ADD ON

## 2010-04-21 LAB — POCT PREGNANCY, URINE: Preg Test, Ur: NEGATIVE

## 2010-05-01 LAB — POCT CARDIAC MARKERS
CKMB, poc: 1.2 ng/mL (ref 1.0–8.0)
Myoglobin, poc: 34.7 ng/mL (ref 12–200)
Troponin i, poc: <0.05 ng/mL (ref 0.00–0.09)
Troponin i, poc: <0.05 ng/mL (ref 0.00–0.09)

## 2010-05-01 LAB — POCT I-STAT, CHEM 8
BUN: 22 mg/dL (ref 6–23)
Calcium, Ion: 1.15 mmol/L (ref 1.12–1.32)
Chloride: 97 mEq/L (ref 96–112)
Creatinine, Ser: 0.3 mg/dL — ABNORMAL LOW (ref 0.4–1.2)
Glucose, Bld: 271 mg/dL — ABNORMAL HIGH (ref 70–99)
HCT: 28 % — ABNORMAL LOW (ref 36.0–46.0)
Hemoglobin: 9.5 g/dL — ABNORMAL LOW (ref 12.0–15.0)
Potassium: 4.6 mEq/L (ref 3.5–5.1)
Sodium: 136 mEq/L (ref 135–145)
TCO2: 29 mmol/L (ref 0–100)

## 2010-05-07 LAB — DIFFERENTIAL
Basophils Absolute: 0.1 10*3/uL (ref 0.0–0.1)
Eosinophils Relative: 4 % (ref 0–5)
Eosinophils Relative: 5 % (ref 0–5)
Lymphocytes Relative: 18 % (ref 12–46)
Lymphs Abs: 1 10*3/uL (ref 0.7–4.0)
Lymphs Abs: 1.4 10*3/uL (ref 0.7–4.0)
Monocytes Absolute: 0.4 10*3/uL (ref 0.1–1.0)
Neutro Abs: 4.4 10*3/uL (ref 1.7–7.7)

## 2010-05-07 LAB — URINALYSIS, ROUTINE W REFLEX MICROSCOPIC
Ketones, ur: NEGATIVE mg/dL
Leukocytes, UA: NEGATIVE
Nitrite: NEGATIVE
Protein, ur: 100 mg/dL — AB
pH: 7 (ref 5.0–8.0)

## 2010-05-07 LAB — POCT I-STAT, CHEM 8
BUN: 12 mg/dL (ref 6–23)
BUN: 18 mg/dL (ref 6–23)
Calcium, Ion: 1.05 mmol/L — ABNORMAL LOW (ref 1.12–1.32)
Chloride: 87 mEq/L — ABNORMAL LOW (ref 96–112)
Chloride: 89 mEq/L — ABNORMAL LOW (ref 96–112)
Creatinine, Ser: 0.6 mg/dL (ref 0.4–1.2)
Creatinine, Ser: 0.6 mg/dL (ref 0.4–1.2)
Glucose, Bld: 481 mg/dL — ABNORMAL HIGH (ref 70–99)
Sodium: 132 mEq/L — ABNORMAL LOW (ref 135–145)
TCO2: 35 mmol/L (ref 0–100)

## 2010-05-07 LAB — POCT CARDIAC MARKERS
CKMB, poc: 1.4 ng/mL (ref 1.0–8.0)
Myoglobin, poc: 40.3 ng/mL (ref 12–200)
Troponin i, poc: <0.05 ng/mL (ref 0.00–0.09)
Troponin i, poc: <0.05 ng/mL (ref 0.00–0.09)
Troponin i, poc: <0.05 ng/mL (ref 0.00–0.09)

## 2010-05-07 LAB — COMPREHENSIVE METABOLIC PANEL
AST: 25 U/L (ref 0–37)
Albumin: 3.2 g/dL — ABNORMAL LOW (ref 3.5–5.2)
CO2: 32 mEq/L (ref 19–32)
Calcium: 9 mg/dL (ref 8.4–10.5)
Creatinine, Ser: 0.55 mg/dL (ref 0.4–1.2)
GFR calc Af Amer: >60 mL/min (ref >60)
GFR calc non Af Amer: >60 mL/min (ref >60)
Total Protein: 6.4 g/dL (ref 6.0–8.3)

## 2010-05-07 LAB — CBC
HCT: 32.3 % — ABNORMAL LOW (ref 36.0–46.0)
MCHC: 32 g/dL (ref 30.0–36.0)
MCV: 69.2 fL — ABNORMAL LOW (ref 78.0–100.0)
MCV: 69.3 fL — ABNORMAL LOW (ref 78.0–100.0)
Platelets: 210 10*3/uL (ref 150–400)
Platelets: 268 10*3/uL (ref 150–400)
RDW: 19.6 % — ABNORMAL HIGH (ref 11.5–15.5)
RDW: 19.7 % — ABNORMAL HIGH (ref 11.5–15.5)

## 2010-05-07 LAB — LIPASE, BLOOD: Lipase: 24 U/L (ref 11–59)

## 2010-05-07 LAB — PROTIME-INR: Prothrombin Time: 14 seconds (ref 11.6–15.2)

## 2010-05-07 LAB — APTT: aPTT: 29 seconds (ref 24–37)

## 2010-05-08 LAB — CROSSMATCH
ABO/RH(D): O POS
Antibody Screen: POSITIVE

## 2010-05-08 LAB — CBC
HCT: 23.4 % — ABNORMAL LOW (ref 36.0–46.0)
HCT: 23.6 % — ABNORMAL LOW (ref 36.0–46.0)
Hemoglobin: 7.5 g/dL — CL (ref 12.0–15.0)
MCHC: 30.9 g/dL (ref 30.0–36.0)
MCHC: 32.3 g/dL (ref 30.0–36.0)
MCV: 69.3 fL — ABNORMAL LOW (ref 78.0–100.0)
MCV: 70.1 fL — ABNORMAL LOW (ref 78.0–100.0)
Platelets: 176 10*3/uL (ref 150–400)
RBC: 4.03 MIL/uL (ref 3.87–5.11)
RBC: 3.37 MIL/uL — ABNORMAL LOW (ref 3.87–5.11)
RBC: 3.7 MIL/uL — ABNORMAL LOW (ref 3.87–5.11)
RDW: 18 % — ABNORMAL HIGH (ref 11.5–15.5)
RDW: 18 % — ABNORMAL HIGH (ref 11.5–15.5)
RDW: 18 % — ABNORMAL HIGH (ref 11.5–15.5)
WBC: 5.1 10*3/uL (ref 4.0–10.5)

## 2010-05-08 LAB — URINE CULTURE
Colony Count: >=100000
Colony Count: NO GROWTH
Culture: NO GROWTH

## 2010-05-08 LAB — URINE MICROSCOPIC-ADD ON

## 2010-05-08 LAB — URINALYSIS, ROUTINE W REFLEX MICROSCOPIC
Bilirubin Urine: NEGATIVE
Glucose, UA: 250 mg/dL — AB
Glucose, UA: 100 mg/dL — AB
Hgb urine dipstick: NEGATIVE
Ketones, ur: NEGATIVE mg/dL
Ketones, ur: NEGATIVE mg/dL
Ketones, ur: NEGATIVE mg/dL
Nitrite: POSITIVE — AB
Nitrite: NEGATIVE
Protein, ur: 100 mg/dL — AB
Protein, ur: 30 mg/dL — AB
Protein, ur: >300 mg/dL — AB
Protein, ur: NEGATIVE mg/dL
Specific Gravity, Urine: 1.013 (ref 1.005–1.030)
Urobilinogen, UA: 0.2 mg/dL (ref 0.0–1.0)
Urobilinogen, UA: 0.2 mg/dL (ref 0.0–1.0)

## 2010-05-08 LAB — LIPASE, BLOOD
Lipase: 13 U/L (ref 11–59)
Lipase: 16 U/L (ref 11–59)

## 2010-05-08 LAB — POCT CARDIAC MARKERS
Troponin i, poc: 0.11 ng/mL — ABNORMAL HIGH (ref 0.00–0.09)
Troponin i, poc: <0.05 ng/mL (ref 0.00–0.09)

## 2010-05-08 LAB — COMPREHENSIVE METABOLIC PANEL
ALT: 11 U/L (ref 0–35)
ALT: 24 U/L (ref 0–35)
AST: 20 U/L (ref 0–37)
AST: 40 U/L — ABNORMAL HIGH (ref 0–37)
Alkaline Phosphatase: 128 U/L — ABNORMAL HIGH (ref 39–117)
BUN: 6 mg/dL (ref 6–23)
CO2: 34 mEq/L — ABNORMAL HIGH (ref 19–32)
Calcium: 9.1 mg/dL (ref 8.4–10.5)
Calcium: 9.4 mg/dL (ref 8.4–10.5)
Chloride: 96 mEq/L (ref 96–112)
Creatinine, Ser: 0.52 mg/dL (ref 0.4–1.2)
GFR calc Af Amer: >60 mL/min (ref >60)
GFR calc Af Amer: >60 mL/min (ref >60)
GFR calc non Af Amer: >60 mL/min (ref >60)
Glucose, Bld: 191 mg/dL — ABNORMAL HIGH (ref 70–99)
Potassium: 4 mEq/L (ref 3.5–5.1)
Potassium: 4.2 mEq/L (ref 3.5–5.1)
Sodium: 136 mEq/L (ref 135–145)
Sodium: 134 mEq/L — ABNORMAL LOW (ref 135–145)
Total Bilirubin: 0.4 mg/dL (ref 0.3–1.2)
Total Protein: 6.5 g/dL (ref 6.0–8.3)
Total Protein: 5.8 g/dL — ABNORMAL LOW (ref 6.0–8.3)

## 2010-05-08 LAB — TSH: TSH: 2.165 u[IU]/mL (ref 0.350–4.500)

## 2010-05-08 LAB — FERRITIN: Ferritin: 13 ng/mL (ref 10–291)

## 2010-05-08 LAB — GLUCOSE, CAPILLARY
Glucose-Capillary: 115 mg/dL — ABNORMAL HIGH (ref 70–99)
Glucose-Capillary: 120 mg/dL — ABNORMAL HIGH (ref 70–99)
Glucose-Capillary: 156 mg/dL — ABNORMAL HIGH (ref 70–99)
Glucose-Capillary: 160 mg/dL — ABNORMAL HIGH (ref 70–99)
Glucose-Capillary: 166 mg/dL — ABNORMAL HIGH (ref 70–99)
Glucose-Capillary: 196 mg/dL — ABNORMAL HIGH (ref 70–99)
Glucose-Capillary: 358 mg/dL — ABNORMAL HIGH (ref 70–99)
Glucose-Capillary: 48 mg/dL — ABNORMAL LOW (ref 70–99)

## 2010-05-08 LAB — CARDIAC PANEL(CRET KIN+CKTOT+MB+TROPI)
CK, MB: 1.3 ng/mL (ref 0.3–4.0)
Relative Index: INVALID (ref 0.0–2.5)
Relative Index: INVALID (ref 0.0–2.5)
Total CK: 24 U/L (ref 7–177)
Troponin I: 0.01 ng/mL (ref 0.00–0.06)
Troponin I: <0.01 ng/mL (ref 0.00–0.06)

## 2010-05-08 LAB — DIFFERENTIAL
Basophils Relative: 0 % (ref 0–1)
Eosinophils Relative: 3 % (ref 0–5)
Eosinophils Relative: 6 % — ABNORMAL HIGH (ref 0–5)
Lymphocytes Relative: 17 % (ref 12–46)
Lymphs Abs: 1.2 10*3/uL (ref 0.7–4.0)
Monocytes Absolute: 0.6 10*3/uL (ref 0.1–1.0)
Monocytes Relative: 10 % (ref 3–12)
Neutro Abs: 4.5 10*3/uL (ref 1.7–7.7)
Neutrophils Relative %: 66 % (ref 43–77)

## 2010-05-08 LAB — CK TOTAL AND CKMB (NOT AT ARMC)
CK, MB: 1.3 ng/mL (ref 0.3–4.0)
Relative Index: INVALID (ref 0.0–2.5)
Total CK: 25 U/L (ref 7–177)

## 2010-05-08 LAB — POCT I-STAT, CHEM 8
BUN: 12 mg/dL (ref 6–23)
Calcium, Ion: 1.12 mmol/L (ref 1.12–1.32)
Creatinine, Ser: 0.7 mg/dL (ref 0.4–1.2)
HCT: 31 % — ABNORMAL LOW (ref 36.0–46.0)
Hemoglobin: 10.5 g/dL — ABNORMAL LOW (ref 12.0–15.0)
Potassium: 4 mEq/L (ref 3.5–5.1)
Sodium: 135 mEq/L (ref 135–145)
Sodium: 137 mEq/L (ref 135–145)
TCO2: 35 mmol/L (ref 0–100)

## 2010-05-08 LAB — MAGNESIUM: Magnesium: 1.8 mg/dL (ref 1.5–2.5)

## 2010-05-08 LAB — HEMOCCULT GUIAC POC 1CARD (OFFICE)
Fecal Occult Bld: NEGATIVE
Fecal Occult Bld: NEGATIVE

## 2010-05-08 LAB — FOLATE: Folate: 12 ng/mL

## 2010-05-08 LAB — POCT PREGNANCY, URINE
Preg Test, Ur: NEGATIVE
Preg Test, Ur: NEGATIVE

## 2010-05-08 LAB — CULTURE, BLOOD (ROUTINE X 2)
Culture: NO GROWTH
Culture: NO GROWTH

## 2010-05-08 LAB — IRON AND TIBC
Iron: 21 ug/dL — ABNORMAL LOW (ref 42–135)
Saturation Ratios: 5 % — ABNORMAL LOW (ref 20–55)
TIBC: 446 ug/dL (ref 250–470)

## 2010-05-08 LAB — PROTIME-INR
INR: 1 (ref 0.00–1.49)
Prothrombin Time: 13.3 seconds (ref 11.6–15.2)

## 2010-05-08 LAB — HEPATIC FUNCTION PANEL
AST: 25 U/L (ref 0–37)
Albumin: 3.2 g/dL — ABNORMAL LOW (ref 3.5–5.2)
Total Protein: 7.8 g/dL (ref 6.0–8.3)

## 2010-05-08 LAB — RETICULOCYTES
RBC.: 3.74 MIL/uL — ABNORMAL LOW (ref 3.87–5.11)
Retic Ct Pct: 1.3 % (ref 0.4–3.1)

## 2010-05-08 LAB — KETONES, QUALITATIVE: Acetone, Bld: NEGATIVE

## 2010-05-08 LAB — LDL CHOLESTEROL, DIRECT: Direct LDL: 96 mg/dL

## 2010-06-10 NOTE — Assessment & Plan Note (Signed)
[facility]   NAME:  Holly [name], Holly [name]               ACCOUNT NO.:  [id number]   MEDICAL RECORD NO.:  [id number]      DATE OF BIRTH:  1965-06-09   PHYSICIAN:  Orlando Penner. [name], M.D.  VISIT DATE:  02/15/2008                                   OFFICE VISIT   HISTORY:  This 45 year old white female with type 2 diabetes is being  followed for a diabetic foot ulcer through-and-through on the right  foot, beginning on the plantar aspect at approximately between the  second and third metatarsal heads.   This has been surgically drained, both dorsally and on the plantar  aspect of the foot, and she has been because of MRSA infection on  treatment with Invanz and then to this vancomycin was added because of  her development C. difficile.   When she was seen here for initial visit last week, it was my feeling  that she probably needed HBO therapy with continuation of wound VAC  therapy as an adjunct.   In addition, she was certainly to have continued her antibiotics.   The patient was reluctant to undertake HBO therapy and had also been  less enthusiastic about her wound VACs because of the noise they made  when she was at work.   Not surprisingly, the patient discontinued her wound VACs 3 days ago  saying that they were not working properly.   At the time she was seen last week, she was sent for an MRI of that  foot, which she had not previously had.  She was also given a request to  her referring physician to send all the appropriate lab work that he  had, as well as a copy of recent EKG, chest x-ray if those were  available and if not, we asked him to have those done in anticipation of  hyperbaric oxygen therapy.  Again to no surprise, those things have not  happened.   The MRI has returned showing changes compatible with early osteo in the  proximal phalanges of the third and fourth toes and in the heads of the  third and fourth metatarsals.  There is no abscess  apparent, but there  is still draining from both of these cavities, that was apparent by x-  ray.  There was also a possibility of septic arthritis of the third and  formatted metatarsophalangeal joints.  There was considerable  surrounding cellulitis and myositis of the plantar musculature of the  foot.   The patient returns today for further evaluation.   EXAMINATION:  Blood pressure is 190/119, pulse 103, respirations 18,  temperature 98.3, blood glucose 175 mg/dL, last done several days ago.   The wound on the plantar aspect of the foot measures 5.5 x 2.4 cm and is  0.5 cm in depth penetrating down to periosteum.   The wound on the dorsal aspect of the foot is 2.2 x 0.2 x 0.3 cm, which  is slightly smaller than before.   Both wounds are still actively draining.  There is considerable slough  in the dorsal wound, and there is considerable callus rim on the plantar  wound.   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   DISPOSITION:  The results of the MRI were reviewed with the patient and  again she understands that our position is uncompromising about the need  for hyperbaric oxygen therapy to try to get this foot healed before more  aggressive surgery and possible foot loss becomes necessary.   The wounds are debrided of the slough and callus as described above, and  this is accomplished without incident.   The wounds were redressed with an application of wound VAC, and we have  made arrangements for [facility] following the patient at home to change  these VACs  initially on a 3 times weekly basis.  They will use the  silver sponge for its antimicrobial effect.   The patient is once again encouraged to proceed with getting the  necessary lab work, so that we can began hyperbaric oxygen as soon as  possible.   Followup visit will be here in 1 week.           ______________________________  Orlando Penner. London Pepper, M.D.     RES/MEDQ  D:  02/15/2008  T:  02/15/2008  Job:  [id number]

## 2010-06-10 NOTE — Consult Note (Signed)
Holly [name], HEFLIN               ACCOUNT NO.:  [id number]   MEDICAL RECORD NO.:  [id number]           PATIENT TYPE:   LOCATION:                                 FACILITY:   PHYSICIAN:  Holly [name]. [name], M.D. DATE OF BIRTH:  01-Sep-1965   DATE OF CONSULTATION:  02/08/2008  DATE OF DISCHARGE:                                 CONSULTATION   This 45 year old white female is seen at the courtesy of Dr. Ferrel [name] for  assistance with management of a chronic wound of the right foot.   The patient has had diabetes mellitus for 6 years and is treated both  with metformin and Levemir at this point.  Her control has been less  than satisfactory with fasting sugars running in the 170 range and with  her having no awareness of recent hemoglobin A1c.   She has not had prior difficulties with her foot but apparently  developed an ulceration underlying the third and fourth toe interspace  (question some initial traumatic basis for this), and this quickly  developed into a deep foot ulcer.  This led to her having surgery with  drainage on December 30, 2007.  This required incision through-and-  through, leaving her with surgical wounds on both the dorsum of the foot  and also on the plantar aspect.  She was returned to surgery several  weeks later for cauterization to stop excess bleeding.   She was found to have staph and MRSA at that time and was treated with  Invanz intravenously, eventually on home therapy.  This unfortunately  had induced C. difficile infection and so vancomycin has been added to  that.  She now uses both with in-home therapy.   She has also been placed on a wound VAC, and this has been of limited  satisfaction because with her return to work, she does not use it for  the 4-day hours a week when she is at work but uses only in the off-duty  hours.  It has created some degree of maceration and so forth.   The patient does not recall whether she had any bone removed at the time  of  surgery, and she does not recall having had a CT scan or other scan  to determine whether or not her foot is free of any further deep  infection at this point.   Apparently, she was sent here primarily to have [place] assist in wound VAC  changes.   PAST MEDICAL HISTORY:  Operations include 3 previous C-sections, a  tonsillectomy as a child, eye surgery for amblyopia as a child, and 2  surgeries mentioned in the present illness.  Other hospitalizations have  been several in connection with this problem and her diabetes.   She has no known medicinal allergies.   Her regular medications include:  1. Levemir 20 units nightly.  2. Metformin 1000 mg b.i.d.  3. Benazepril 10 mg daily.  4. Fluoxetine 20 mg daily.  5. Furosemide 20-40 mg daily.  6. Vancomycin, uncertain dose intravenously b.i.d.  7. Invanz 1 g daily IV.  8. Metronidazole 250  mg p.o. t.i.d.  9. Nu-Iron 150 mg b.i.d.   Her family history is not obtained in any detail at this point.   Her review of systems shows that she has no known difficulties with her  ears.  She has never had known heart disease.  Her edema has been  attributed to poor venous circulation.  She has had some diabetic  neuropathy but denies any awareness of diabetic troubles or diabetic  kidney troubles.  She is hypertensive and that is controlled on  medication.  She had no chronic gastrointestinal problems but obviously  has a C.  difficile as previously indicated.  She is unaware of any  renal disease.  She has never had the seizure disorder.  She has been  depressed and is on fluoxetine 20 mg nightly at bedtime.   PHYSICAL EXAMINATION:  VITAL SIGNS:  Blood pressure is 166/102, pulse is  99, respirations 16, temperature 98.0.  Her blood glucose this morning,  self determined, was 170 mg/dL.  HEENT:  The patient has amblyopia, and this is reflected in her  appearance.  She has clear tympanic membranes.  Her oral cavity is  unremarkable.  NECK:  She has  no obvious thyroid enlargement.  CHEST:  Grossly clear.  HEART:  Rapid but regular with an S4 but no evidence of S3.  EXTREMITIES:  A 2-3+ plus edema bilateral.  Her pulses are palpable and  seem essentially normal.  Detailed neurologic exam of the feet is not  carried out at this time.  On the right foot, there are surgical wounds  unhealed, on the dorsum of the foot overlying the third and fourth  metatarsal interspace, wound measuring 3 cm x 0.2 cm x 0.3 cm in depth.  On the plantar aspect of the foot, there is reflection of the same  original surgical wound, which measures there 4 x 2.3 cm x approximately  1.5 cm in depth down to beefy tissue with similar rolled callus at the  wound margins.  There is evidence of maceration and probable wound VAC  and first-degree skin injury surrounding the third and fourth toes.   IMPRESSION:  1. Diabetic foot ulcer secondary to drained abscess and with question      of persisting deep infection in the foot.  2. Hypertension, poorly controlled.  3. Diabetes mellitus, type 2, poorly controlled.  4. Depression, on treatment.   DISPOSITION:  The wounds are debrided of the dorsal wound of a small  amount of fibrinous, almost membranous, slough throughout the length of  the wound.  This is done with selectively using scalpel and forceps with  no difficulty with no bleeding.  The some of the callus and some slough  in the plantar wound is also selectively debrided using a scalpel with  minimal bleeding there and with silver nitrate used for hemostasis.  The  wound, otherwise, looks reasonably clean.   The plan today is:  1. To move in the direction of hyperbaric oxygen therapy to try to      hasten the healing of this wound.  In preparation for that, we will      obtain an MRI to see the extent of what we are dealing with in      terms of any osteomyelitis, etc.  We will also obtain the      appropriate lab work and chest x-ray and EKG.  2. We will  in the meanwhile continue her on wound VAC but recommended  to be 2 separate applications of the pads, 1 on the plantar aspect      of the foot, 1 on the dorsal aspect of the foot, connected with a      wide tube.  We have strongly recommended that she continue this 24      hours a day rather than omitting it when she is at work as we have      indicated.  3. She is to continue on her current antibiotics.  4. We will attempt to assemble appropriate lab work from what has been      done previously or to obtain at ourselves as we begin clearance and      move toward hyperbaric therapy.  A Followup visit here will be in 1      week.           ______________________________  Holly [name]. London Pepper, M.D.     RES/MEDQ  D:  02/08/2008  T:  02/08/2008  Job:  [id number]   cc:   Dr. Jeralene Huff  Dr. Ferrel [name]

## 2010-06-10 NOTE — Assessment & Plan Note (Signed)
[facility]   NAME:  Holly [name], Holly [name]               ACCOUNT NO.:  [id number]   MEDICAL RECORD NO.:  [id number]      DATE OF BIRTH:  Jun 23, 1965   PHYSICIAN:  Kathrin Penner, M.D.    VISIT DATE:  02/29/2008                                   OFFICE VISIT   PROBLEM:  The patient is a 45 year old female with type 2 diabetes being  treated for a Wagner 3 diabetic ulcer from the plantar surfaces of the  foot onto the dorsum of the foot emerging between the third and fourth  interdigital spaces.  The patient has been on a wound VAC treatment for  this, but has not been particularly compliant in that.  She takes the  wound VAC off leaving the vacuum pump off because while she is at work,  the sound is embarrassing and disturbing to her coworkers.  The patient  has been treated with Invanz for an MRSA infection.  Her recent MRIs  suggested some early osteomyelitis with some edema in the region of the  interdigital spaces and around the joint surfaces, but without any  actual evidence of cortical bony destruction.  There is also a question  of whether or not she had some septic arthritis within the third and  fourth MP joints.  The patient is considered a candidate for hyperbaric  oxygen therapy given her diabetes and the possibility of her  osteomyelitis, however, her laboratory results in working up and  qualifying her for hyperbaric oxygen is not yet returned.  There is some  question as to whether or not she has any significant renal  insufficiency.   On examination today, the patient is afebrile with a pulse of 106, blood  pressure is mildly elevated at 161/95.  The plantar foot wound measured  2.7 x 2.3 x 0.2 cm.  I do not see a sinus tract leading from this to the  dorsum of the foot today.  The posterior foot wound, which measures 2.0  by approximately 0.2 cm is almost completely closed showing only some  minimal amount of slough.   I went ahead and debrided the  plantar surface of the foot and I debrided  some of the callus surrounding the ulcer.  I have put her back on a  wound VAC and we will go ahead and await the results of her laboratory  workup with respect to her HBO therapy.  I had a long talk with her  asking her to please keep the [facility] in place at all times despite the  apparent embarrassment, but to be important for [place] to get this wound  closed as soon as possible.  We will see her back again in approximately  1 week.      Kathrin Penner, M.D.  Electronically Signed     PB/MEDQ  D:  02/29/2008  T:  03/01/2008  Job:  [id number]

## 2010-06-10 NOTE — Assessment & Plan Note (Signed)
[facility]   NAME:  Holly [name], Holly [name]               ACCOUNT NO.:  [id number]   MEDICAL RECORD NO.:  [id number]      DATE OF BIRTH:  1965/03/08   PHYSICIAN:  Orlando Penner. [name], M.D.  VISIT DATE:  02/22/2008                                   OFFICE VISIT   This 45 year old white female with type 2 diabetes is seen for a  diabetic foot ulcer, Wagner 3, running through and through on her right  foot in the 3 and 4 digital interspace.  She has been on 2 separate  wound VACs for the dorsal and plantar wounds and also is on Invanz for  MRSA infection.  A recent MRI has suggested an early osteomyelitis in  the proximal phalanges of the third and fourth toes and the heads of the  third and fourth metatarsals.  There is also possibility of septic  arthritis of the third and fourth MP joints.   We have wanted to get her into hyperbaric oxygen therapy but because of  her employment and difficulty in gathering the necessary records from  the appropriate physicians, this has been delayed.   She is here today with a complaint of some increase in her generalized  edema but beyond that feels she is doing satisfactorily.   EXAMINATION:  Blood pressure is 163/112, pulse 98, respirations  unrecorded.  Capillary blood glucose 183 mg/dL.  The wound on the  plantar aspect of her foot measures 2.4 x 6.4 x 0.5 cm and has  considerable callus at its margin and some slough in its base.  The  dorsal wound measures 2.2 x 0.2 x 0.4 cm and again has a considerable  slough within the wound itself.   IMPRESSION:  Diabetic foot ulcer, Wagner 3, status quo on heating,  ventilating, and air conditioning therapy and antibiotics; hyperbaric  oxygen pending.   DISPOSITION:  Both wounds are today debrided to get rid of as much of  the slough as I can possibly do and also to reduce some of the callus at  the periphery of the plantar wound.  Both wounds bled vigorously and  silver nitrate and  compression wrap are used to obtain hemostasis.   Once these are completed, she is returned to her wound VACs.  We will  proceed with obtaining the appropriate lab work since it is not  available from her primary physician, but we will request from him that  will be given a cardiac clearance for this patient based on the fact  that she is grossly edematous.  It is unclear to [place] whether she has had  nephrotic syndrome and if diabetic base is excluded, etc., and we will  ask these questions as well.   To me, there is some urgency in getting her into hyperbaric therapy if  we are to expect a satisfactory long-range outcome.  Meanwhile, followup  visit will be here in 2 days for a change of the wound VACs again.          ______________________________  Orlando Penner. London Pepper, M.D.    RES/MEDQ  D:  02/22/2008  T:  02/23/2008  Job:  [id number]   cc:   Dr. Jefm Petty
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
## 2010-06-11 ENCOUNTER — Encounter: Payer: Self-pay | Admitting: Cardiovascular Disease

## 2010-06-13 NOTE — Discharge Summary (Signed)
[facility]  Patient:    DEVONYA, NEES                     MRN: [id number] Adm. Date:  [id number] Disc. Date: 11/12/99 Attending:  Edmonia Caprio Dictator:   Charlcie Cradle, M.D. CC:         Mamie Laurel, M.D.   Discharge Summary  DISCHARGE DIAGNOSES:          1. Type 2 insulin-dependent diabetes.                               2. Status post low transverse cesarean section                                  for scheduled repeat.                               3. Question of pregnancy-induced hypertension.                               4. Depression.  DISCHARGE MEDICATIONS:        1. Ibuprofen 600 mg q.6h. p.r.n.                               2. Percocet one to two q.6h. p.r.n.                               3. Prozac 20 mg p.o. q.d.                               4. Ortho-Cyclen one p.o. q.d. beginning on                                  November 30, 1999.                               5. Prenatal vitamins one p.o. q.d.  BRIEF ADMISSION HISTORY AND PHYSICAL:                 This patient is a 45 year old, G9, P2-2-5-4, admitted at 37 weeks, complaining of right-sided and mid abdominal pain.  The patient reported that she was having contractions, however, they were irregular.  Her blood pressure was checked that day and was found to be in the 160s/100s, which concerned her and caused her to come into the emergency room at maternity admissions.  She denied any headache, dizziness, visual changes, shortness of breath, nausea, or vomiting.  She reported that her pregnancy was complicated by poorly controlled diabetes and possibly PIH.  PAST OBSTETRICAL HISTORY:     Significant for four SABs and one TAB.  She had a term spontaneous vaginal delivery in 1994.  She had a C-section for breech at 37 weeks in 1999.  She had a repeat C-section at approximately 36 weeks and a pregnancy complicated by gestational diabetes and PIH.  LABORATORY DATA:  On admission, her prenatal labs were significant for positive group B streptococcus.  The patients Christie labs on admission were negative, including a uric acid of 4.0, an AST of 15, an ALT of 9, and an HDL of 176.  Her UA was negative for protein.  Her platelet count was 151.  PHYSICAL EXAMINATION:         Afebrile with a blood pressure of 149/103 and 130/86 on repeat.  The fetal heart rate was 150s-160s with positive acceleration and mild variable deceleration.  Irregular uterine contractions, approximately one to two an hour.  The patients cervix was found to be 1, thick, and high.  The baby was found to be vertex.  IMPRESSION:                   The patient was admitted secondary to abdominal pain and an increased blood pressure, but with normal PIH labs, and nonreassuring fetal heart tones secondary to mild variable decelerations.  She was monitored overnight.  HOSPITAL COURSE: #1 - STATUS POST LOW TRANSVERSE CESAREAN SECTION FOR SCHEDULED REPEAT:  On November 09, 1999, the patient continued to have moderate recurrent variables. Therefore, the patient was taken for a low transverse cesarean section as scheduled repeat.  The patient had previously been scheduled for a repeat C-section on November 11, 1999, but secondary to nonreassuring fetal heart tones, this was done on November 09, 1999.  Please see the operative note for complete dictation.  Postoperatively, the patient had routine postoperative care.  She did, however, have a facial and truncal rash which was erythematous, maculopapular, and pruritic, which was thought to be possibly secondary to the patients pain medications and her PCA.  The patient was therefore given Narcan and switched to Percocet p.o. for pain relief.  The rash resolved without any further incidents.  The patient received routine postoperative care.  She will return on November 14, 1999, to have her staples removed.  #2 - QUESTIONABLE  PREGNANCY-INDUCED HYPERTENSION:  The patients vitals remained normal throughout her hospitalization.  Upon discharge, her blood pressure was in the 130s-140s/70s-80s.  The patient was asymptomatic.  The patient will likely need to be followed long term for possible chronic hypertension.  #3 - TYPE 2 DIABETES:  The patient was on insulin throughout the hospitalization, however, postpartum the patients sugars remained in the high normal range.  The patients sugars prior to discharge were no higher than 151.  Therefore, she was noted started on any insulin or any hyperglycemic agent.  The patient was instructed to check for sugars every morning before each meal and to keep a log of these sugars.  She will be following up with Mamie Laurel, M.D., in her office where she may need to be started on an oral agent versus insulin in the future.  #4 - DEPRESSION:  The patient has a history of depression in the past and was previously on Prozac.  During her postoperative course, the patient was questioned about this.  She stated that she is currently not depressed, however, was interested on going back on Prozac as she felt that she may have a component of postpartum depression.  The patient was started on Prozac 20 mg a day prior to discharge, which she will continue on and be seen in follow-up with Mamie Laurel, M.D.  DISPOSITION:                  The patient is discharged in stable condition. DD:  11/12/99 TD:  11/12/99 Job: [id number]

## 2010-06-13 NOTE — Discharge Summary (Signed)
[facility]  Patient:    Holly [name], Holly [name]                     MRN: [id number] Adm. Date:  [id number] Disc. Date: [id number] Attending:  Edmonia Caprio Dictator:   Charlcie Cradle, M.D.                           Discharge Summary  DISCHARGE DIAGNOSES:          1. Intrauterine pregnancy at 36-2/7 weeks.                               2. Insulin-dependent diabetes mellitus.                               3. Questionable evolving preeclampsia.  DISCHARGE MEDICATIONS:        1. NPH insulin 20 units in the morning and 15                                  units at night.                               2. Humulin insulin 10 units in the morning and                                  10 units at dinner.                               Of note, these insulin doses are almost half                               of what the patient was taking prior to hospital                               admission.  BRIEF ADMISSION H&P:          The patient is a 45 year old G9, P1-2-5-3 admitted at 41 and 0 weeks by LMP, confirmed by an eight week scan.  The patient had been followed for insulin dependent diabetes and evolving preeclampsia and came to maternity admission for prenatal visit and nonstress test.  The patient reported on admission that she was having decreased fetal movement and new onset hand edema.  The patient also reported history of headache in the past but no headache on admission.  The patient also reported that her CBGs had improved to a desirable range over the past few days.  The patient had been on modified bed rest and an ADA diet and reported that she had been fairly compliant with this.  Of note, the patient is an insulin dependent diabetic and her glycohemoglobin was greater than 10 at 13 weeks, although a fetal echo was within normal limits.  The patient also possibly has chronic hypertension but no evidence of severe preeclamptic disease.   The patient also had two previous C-sections in the past and desires a repeat  C-section and tubal ligation.  Of note, the patient had a 24-hour urine collection done on October 30, 1999, which showed 1400 cc of urine and 264 mg of protein.  Previously the patient had 2400 cc of urine was 294 mg of protein.  The patient was admitted for following of her insulin dependent diabetes and following of her possible evolving preeclampsia and for possible amniocentesis.  HOSPITAL COURSE BY DISCHARGE DIAGNOSES:                                #1 - INSULIN DEPENDENT DIABETES:  The patient was admitted and placed on an ADA diet.  Throughout the hospital admission the patients blood sugars were in excellent control with the highest blood sugar during the admission being 122.  The patient was routinely receiving approximately half of her outpatient insulin requirements while in the hospital.  Therefore, before discharge the patients insulin requirements were changed to that of which she required in the hospital.  It is unclear as to whether she had just had better diet control in this controlled setting in the hospital or whether the patient was possibly having decreased placental functioning; however, the patients placenta on ultrasound looks within normal limits so suspect it is largely compliance issues and the controlled environment in the hospital.                                #2 - POSSIBLE EVOLVING PREECLAMPSIA:  The patients blood pressure before discharge was 120/80.  She had one blood pressure that was 150/102, but in general the patients blood pressures remained within reasonable range.  LABS ON ADMISSION:            UA with 30 mg/dl of protein.  The patients prior preeclampsia labs which were done on October 17, 1999, showed a uric acid of 3.7, an LDH of 174, LFTs which were within normal limits and a platelet count of 172.                                #3 - INTRAUTERINE PREGNANCY  AT 36-2/7 WEEKS ON DISCHARGE:  The patient had NST testing done throughout the hospitalization. The patients strips remained reactive.  The patient otherwise received routine prenatal care.  The patient was initially scheduled for an amniocentesis on November 03, 1999; however, given the fact that the patient was only 36-2/7 weeks and the patient was highly concerned about the amniocentesis, it was decided that the patient would wait for the amniocentesis until November 10, 1999, at which point the patient will be 37-2/7 and have a much higher likelihood of fetal lung maturity. At that time if the patient is found to have fetal lung maturity, then she may undergo repeat elective C-section at that time.  In the meantime, the patient will be discharged home for biweekly NSTs and is instructed to continue taking her blood sugars, to continue to strive for tight control of her diabetes and to return if she has any signs of labor or headache, right upper quadrant pain or visual changes.  CONDITION ON DISCHARGE:       The patient is discharged home in stable condition. DD:  11/03/99 TD:  11/04/99 Job: [id number]

## 2010-06-13 NOTE — Op Note (Signed)
[facility]  Patient:    Holly [name], Holly [name]                     MRN: [id number] Proc. Date: 11/09/99 Adm. Date:  [id number] Attending:  Edmonia Caprio                           Operative Report  PREOPERATIVE DIAGNOSES:       1. Intrauterine pregnancy at 37+ weeks                                  gestation.                               2. Insulin-dependent diabetes mellitus.                               3. Hypertension.                               4. - component.                               5. Probable fetal tachycardia.  POSTOPERATIVE DIAGNOSES:      1. Intrauterine pregnancy at 37+ weeks                                  gestation.                               2. Insulin-dependent diabetes mellitus.                               3. Hypertension.                               4. - component.                               5. Probable fetal tachycardia.  OPERATION/PROCEDURE:          Low transverse cesarean section.  SURGEON:                      Cranston Neighbor, M.D.  ASSISTANT:                    Verdell Carmine, M.D.  ANESTHESIA:                   Spinal.  OPERATIVE FINDINGS:           Infant weighing 7 pounds 11 ounces, Apgar scores of 8 at one minute and 9 at five minutes.  Cord pH 7.23.  Placenta sent to pathology.  Significant scar tissue both in the fascial as well as the peritoneal tissue.  DESCRIPTION OF PROCEDURE:     After spinal anesthesia was obtained the patient was placed in the supine position  with left tilt.  The patient was receiving oxygen.  The abdomen was prepped and draped in usual sterile fashion and a low transverse Pfannenstiel incision made and taken down through the skin and subcutaneous tissue to the fascia.  The peritoneal cavity was entered and a bladder flap created.  A low transverse uterine incision was made and the infant delivered from vertex presentation with assistance of a vacuum.   The remainder of the body of the infant was delivered and the cord clamped and cut and the infant handed off to the neonatologist in attendance.  The placenta did not deliver spontaneously and it felt there was some degree of accreta upon manual removal.  The uterus, bladder flap, peritoneum were inspected. The incision was closed in routine fashion.  Estimated blood loss was less than 800 cc.  Sponge, needle, and instrument counts were correct. DD:  11/09/99 TD:  11/10/99 Job: [id number]

## 2010-06-19 ENCOUNTER — Encounter: Payer: Self-pay | Admitting: Cardiovascular Disease

## 2010-06-26 ENCOUNTER — Ambulatory Visit: Payer: Self-pay | Admitting: Cardiovascular Disease

## 2010-07-09 ENCOUNTER — Encounter: Payer: Self-pay | Admitting: Cardiovascular Disease

## 2010-07-10 ENCOUNTER — Encounter: Payer: Self-pay | Admitting: *Deleted

## 2010-07-10 ENCOUNTER — Encounter: Payer: Self-pay | Admitting: Cardiovascular Disease

## 2010-07-10 ENCOUNTER — Ambulatory Visit (INDEPENDENT_AMBULATORY_CARE_PROVIDER_SITE_OTHER): Payer: Self-pay | Admitting: Cardiovascular Disease

## 2010-07-10 DIAGNOSIS — I5032 Chronic diastolic (congestive) heart failure: Secondary | ICD-10-CM | POA: Insufficient documentation

## 2010-07-10 DIAGNOSIS — I10 Essential (primary) hypertension: Secondary | ICD-10-CM

## 2010-07-10 DIAGNOSIS — Z79899 Other long term (current) drug therapy: Secondary | ICD-10-CM

## 2010-07-10 DIAGNOSIS — I509 Heart failure, unspecified: Secondary | ICD-10-CM

## 2010-07-10 DIAGNOSIS — IMO0001 Reserved for inherently not codable concepts without codable children: Secondary | ICD-10-CM

## 2010-07-10 DIAGNOSIS — I5030 Unspecified diastolic (congestive) heart failure: Secondary | ICD-10-CM

## 2010-07-10 LAB — BASIC METABOLIC PANEL
BUN: 31 mg/dL — ABNORMAL HIGH (ref 6–23)
Creatinine, Ser: 1 mg/dL (ref 0.4–1.2)
GFR: 63.85 mL/min (ref >60.00)

## 2010-07-10 LAB — BRAIN NATRIURETIC PEPTIDE: Pro B Natriuretic peptide (BNP): 167 pg/mL — ABNORMAL HIGH (ref 0.0–100.0)

## 2010-07-10 NOTE — Assessment & Plan Note (Signed)
Improved Target A1c 6.5  F/U opthamologist for retinopathy

## 2010-07-10 NOTE — Assessment & Plan Note (Signed)
Well controlled.  Continue current medications and low sodium Dash type diet.    

## 2010-07-10 NOTE — Progress Notes (Signed)
Complicated 45 yo referred by Dr Jeralene Huff.  She has extensive cardiac history at [facility] and I do not have these records.  Long standing DM only recently compliant with meds and better control with A1c gong from 9 to 6.5 range.  She has had significant retinopathy which got her to take her DM seriously.  No SSCP.  ? History of CHF.  Has had myovue and cath at [facility] in last two years and she indicates they didn't have any blockages.  Hosp. At [facility] 12/11 for ? Diastolic CHF. Reviewed records.  Echo showed EF 65% with significant TR and estimated PA pressure of 59 mmHg.  Currently on 3 different diuretics and has chronic LE edema.  Was on hydralazine in addition to current meds but felt fatigued and thought her BP was too low.  She is confused about the diagnosis of CHF and I explained the difference between diastolic and systolic.  I will try to get her records from [facility] as the right sided findings are more worrisome to me.  Encouraged her to continue to have tight BS control.  Will check BMET and try to simplify her diuretics.  Mild exertional dyspnea, no SSCP, edema at baseline.  Now compliant with meds.  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  All other systems reviewed and negative  History of significant anemia ? From [place] with Hb 9.7   General: Affect appropriate Healthy:  appears stated age HEENT: normal Neck supple with no adenopathy JVP normal no bruits no thyromegaly Lungs clear with no wheezing and good diaphragmatic motion Heart:  S1/S2 SEM  murmur,rub, gallop or click PMI normal Abdomen: benighn, BS positve, no tenderness, no AAA no bruit.  No HSM or HJR Distal pulses intact with no bruits Trace LLE edema Neuro non-focal Skin warm  and dry No muscular weakness  Medications Current Outpatient Prescriptions  Medication Sig Dispense Refill  . benazepril (LOTENSIN) 40 MG tablet Take 20 mg by mouth daily.        . carvedilol (COREG) 12.5 MG tablet Take 12.5 mg by mouth 2 (two) times daily with a meal.        . FLUoxetine (PROZAC) 20 MG capsule Take 20 mg by mouth daily.        . furosemide (LASIX) 40 MG tablet Take 20 mg by mouth as needed.       . hydrochlorothiazide (HYDRODIURIL) 12.5 MG tablet Take 12.5 mg by mouth daily.        . insulin detemir (LEVEMIR) 100 UNIT/ML injection Inject into the skin as directed.        . metFORMIN (GLUCOPHAGE) 1000 MG tablet Take 1,000 mg by mouth 2 (two) times daily with a meal.        . spironolactone (ALDACTONE) 25 MG tablet Take 25 mg by mouth daily.        Marland Kitchen zolpidem (AMBIEN) 10 MG tablet Take 10 mg by mouth at bedtime as needed.        Marland Kitchen DISCONTD: benazepril (LOTENSIN) 5 MG tablet Take 5 mg by mouth daily.        Marland Kitchen DISCONTD: metFORMIN (GLUCOPHAGE-XR) 500 MG 24 hr tablet Take 500 mg by mouth 2 (two) times daily.          Allergies Bactrim and Tekturna  Family History: Family History  Problem Relation Age of Onset  . Hypertension Mother   . Myelodysplastic syndrome Father   . Pancreatic cancer      Social History: History   Social History  . Marital Status: Single    Spouse Name: N/A    Number of Children: 4  . Years of Education: N/A   Occupational History  . collections    Social History Main Topics  . Smoking status: Never Smoker   . Smokeless tobacco: Never Used  . Alcohol Use: No  . Drug Use: No  . Sexually Active: Not on file   Other Topics Concern  . Not on file   Social History Narrative  . No narrative on file    Electrocardiogram:  [facility] normal ECG 12/29/09  Assessment and Plan

## 2010-07-10 NOTE — Patient Instructions (Signed)
Your physician wants you to follow-up in: 6 MONTHS You will receive a reminder letter in the mail two months in advance. If you don't receive a letter, please call our office to schedule the follow-up appointment. 

## 2010-07-10 NOTE — Assessment & Plan Note (Signed)
Not clear that she has had this or not.  On plenty of diuretics..  Check BMET and BNP today.  Review records from [facility].  Depending on labs will try to simplify diuretics  At some point will repeat echo and may need right heart cath in future if estimated PA pressures still high.

## 2010-07-11 ENCOUNTER — Telehealth: Payer: Self-pay | Admitting: Cardiovascular Disease

## 2010-07-11 NOTE — Telephone Encounter (Addendum)
ROi faxed to [facility]) @ [phone]  07/11/10/km  Records received from [facility] gave to [facility]  07/11/10/km

## 2010-07-21 ENCOUNTER — Telehealth: Payer: Self-pay | Admitting: Cardiovascular Disease

## 2010-07-21 NOTE — Telephone Encounter (Signed)
Returning call back from last week.

## 2010-07-21 NOTE — Telephone Encounter (Signed)
Pt rtn call requesting call back today re meds

## 2010-07-22 ENCOUNTER — Ambulatory Visit: Payer: Self-pay | Admitting: Gastroenterology

## 2010-07-24 ENCOUNTER — Encounter: Payer: Self-pay | Admitting: *Deleted

## 2010-07-24 NOTE — Telephone Encounter (Signed)
Unable to reach pt or leave a message, Letter of results sent to pt with instructions Holly [name]

## 2010-07-29 ENCOUNTER — Telehealth: Payer: Self-pay | Admitting: Cardiovascular Disease

## 2010-07-29 NOTE — Telephone Encounter (Signed)
Pt calling complaining high blood pressure after Dr. Johnsie Cancel took her off spironolactone and hydrochlorothiazide. Pt said Dr. Johnsie Cancel took her off this medications about 1 week ago.   Pt took BP yesterday 180/ 98.  Between 160-190/high 90's  Pt expressed ringing in "head"   Pt would like to be advised what to do.

## 2010-08-01 MED ORDER — HYDROCHLOROTHIAZIDE 12.5 MG PO TABS: 12.5000 mg | ORAL_TABLET | Freq: Every day | ORAL | Status: DC

## 2010-08-01 NOTE — Telephone Encounter (Signed)
Discussed with dr Malon Kindle), left message for pt to restart HCTZ as before. She will track her bp and let us know how it is running [employer]

## 2010-08-01 NOTE — Telephone Encounter (Signed)
You can reach pt after 2pm at number listed above she is rtning call from Tuesday and she is very concerned because her pressure is really high and she wants to talk to someone today

## 2010-08-01 NOTE — Telephone Encounter (Signed)
Spoke with pt, since her aldactone and HCTZ was stopped her bp is running 200-180/90's. She also reports the lasix made her dizzy and she has stopped taking it. She wants to know what to do. Will forward for dr Johnsie Cancel review Fredia Beets

## 2010-08-19 ENCOUNTER — Telehealth: Payer: Self-pay | Admitting: Cardiovascular Disease

## 2010-08-19 NOTE — Telephone Encounter (Signed)
Pt needs refill of 12.5 mg carvedilol, [facility]

## 2010-08-20 ENCOUNTER — Other Ambulatory Visit: Payer: Self-pay | Admitting: *Deleted

## 2010-08-20 ENCOUNTER — Encounter: Payer: Self-pay | Admitting: Physician Assistant

## 2010-08-20 ENCOUNTER — Telehealth: Payer: Self-pay | Admitting: Gastroenterology

## 2010-08-20 ENCOUNTER — Ambulatory Visit (INDEPENDENT_AMBULATORY_CARE_PROVIDER_SITE_OTHER): Payer: 59 | Admitting: Physician Assistant

## 2010-08-20 ENCOUNTER — Telehealth: Payer: Self-pay | Admitting: *Deleted

## 2010-08-20 VITALS — BP 119/72 | HR 84 | Ht 67.0 in | Wt 166.0 lb

## 2010-08-20 DIAGNOSIS — G909 Disorder of the autonomic nervous system, unspecified: Secondary | ICD-10-CM

## 2010-08-20 DIAGNOSIS — G901 Familial dysautonomia [Riley-Day]: Secondary | ICD-10-CM | POA: Insufficient documentation

## 2010-08-20 DIAGNOSIS — I951 Orthostatic hypotension: Secondary | ICD-10-CM

## 2010-08-20 LAB — CBC WITH DIFFERENTIAL/PLATELET
Basophils Absolute: 0.1 10*3/uL (ref 0.0–0.1)
Eosinophils Absolute: 0.4 10*3/uL (ref 0.0–0.7)
HCT: 23.1 % — CL (ref 36.0–46.0)
Lymphs Abs: 1.5 10*3/uL (ref 0.7–4.0)
MCHC: 33.9 g/dL (ref 30.0–36.0)
Monocytes Relative: 5.8 % (ref 3.0–12.0)
Platelets: 184 10*3/uL (ref 150.0–400.0)
RDW: 14.6 % (ref 11.5–14.6)

## 2010-08-20 LAB — BASIC METABOLIC PANEL
Calcium: 8.8 mg/dL (ref 8.4–10.5)
Creatinine, Ser: 1.5 mg/dL — ABNORMAL HIGH (ref 0.4–1.2)
GFR: 40.12 mL/min — ABNORMAL LOW (ref >60.00)
Sodium: 142 mEq/L (ref 135–145)

## 2010-08-20 LAB — HEPATIC FUNCTION PANEL
AST: 12 U/L (ref 0–37)
Total Bilirubin: 0.5 mg/dL (ref 0.3–1.2)

## 2010-08-20 LAB — TSH: TSH: 1.56 u[IU]/mL (ref 0.35–5.50)

## 2010-08-20 MED ORDER — CARVEDILOL 12.5 MG PO TABS: 12.5000 mg | ORAL_TABLET | Freq: Two times a day (BID) | ORAL | Status: DC

## 2010-08-20 MED ORDER — MIDODRINE HCL 10 MG PO TABS: 10.0000 mg | ORAL_TABLET | Freq: Three times a day (TID) | ORAL | Status: AC

## 2010-08-20 NOTE — Patient Instructions (Addendum)
You have been referred to Dr Caryl Comes 2-4 weeks  Your physician has recommended you make the following change in your medication: start Midodrine 10mg  one three times daily  Your physician recommends that you return for lab work today  BMP/CBC/Liver Panel/TSH

## 2010-08-20 NOTE — Progress Notes (Signed)
History of Present Illness: Primary Cardiologist:  Dr. Kennedy Bucker Naeem is a 45 y.o. female seen back for dizziness.  She saw Dr. Johnsie Cancel for the first time in June 2012.  There was a question of whether or not the patient has diastolic heart failure.  Echocardiogram done 12/11: EF 60-65%, severe LVH, mild BAE, grade 3 diastolic dysfunction, severe tricuspid regurgitation, PASP 59, small pericardial effusion, moderate pulmonary hypertension.  She has diabetes with diabetic retinopathy and hypertension as well as anemia secondary to menorrhagia.  She has chronic lower extremity edema.  Records were obtained from [facility] in [facility].  She had a cardiac catheterization 07/26/08 that demonstrated normal coronary arteries.  She also had a right heart catheterization (PA 35/18, wedge pressure mean 9, cardiac output 6.2).  LV function was normal.    She has had problems with dizziness with standing for a month or two.  She feels lightheaded with standing and near syncopal.  She denies syncope.  When she saw Dr. Johnsie Cancel, her Spironolactone was discontinued.  She was also asked to take Lasix as needed.  She has not really been taking it that much.  She is still on HCTZ.  She denies chest pain or dyspnea.  She denies any recent illnesses. She does have some nausea and vomiting.  Also notes some abdominal pain.  She sees gastroenterology next week.    Past Medical History  Diagnosis Date  . DM2 (diabetes mellitus, type 2)   . Hypercholesteremia   . Chest pain   . Bulimia   . ADD (attention deficit disorder)   . Acute cystitis   . Anemia, unspecified   . Anxiety state, unspecified   . Essential hypertension, benign   . Chronic ulcer of unspecified site   . Chronic inflammatory demyelinating polyneuritis   . Dysthymic disorder   . Peripheral autonomic neuropathy in disorders classified elsewhere   . Type II or unspecified type diabetes mellitus with other specified manifestations, not  stated as uncontrolled   . Background diabetic retinopathy   . Diarrhea     symptomatic  . Edema     moderate  . Esophageal reflux   . HLD (hyperlipidemia)     HDL goal >50, LDL goal <100  . Unspecified essential hypertension   . Disorders of magnesium metabolism   . Insomnia, unspecified   . Irritable bowel syndrome   . Left heart failure   . Muscle weakness (generalized)   . Calculus of kidney   . Dysuria   . Palpitations   . Primary pulmonary hypertension     not seen at [facility]  (55mm Hg)  . Neuralgia, neuritis, and radiculitis, unspecified   . Restless legs syndrome (RLS)   . Encounter for long-term (current) use of other medications     Current Outpatient Prescriptions  Medication Sig Dispense Refill  . benazepril (LOTENSIN) 40 MG tablet Take 40 mg by mouth daily.       . calcium carbonate (OS-CAL) 600 MG TABS Take 600 mg by mouth 2 (two) times daily with a meal.        . carvedilol (COREG) 12.5 MG tablet Take 12.5 mg by mouth 2 (two) times daily with a meal.        . ferrous sulfate 325 (65 FE) MG tablet Take 325 mg by mouth daily with breakfast.        . FLUoxetine (PROZAC) 20 MG capsule Take 20 mg by mouth daily.        Marland Kitchen  hydrochlorothiazide (HYDRODIURIL) 12.5 MG tablet Take 1 tablet (12.5 mg total) by mouth daily.  30 tablet  12  . insulin detemir (LEVEMIR) 100 UNIT/ML injection Inject into the skin as directed.       . metFORMIN (GLUCOPHAGE) 1000 MG tablet Take 1,000 mg by mouth 2 (two) times daily with a meal.        . zolpidem (AMBIEN) 10 MG tablet Take 10 mg by mouth at bedtime as needed.          Allergies: Allergies  Allergen Reactions  . Bactrim   . Tekturna (Aliskiren Fumarate)     Social Hx:  Non smoker  ROS:  See HPI.  No diarrhea.  No melena.   No hematochezia.  She has lost about 8 pounds in the last few months.  All other systems reviewed and negative.  Vital Signs: BP 178/102  Pulse 82  Ht 5\' 7"  (1.702 m)  Wt 166 lb (75.297 kg)  BMI 26.00  kg/m2   Filed Vitals:   08/20/10 1205 08/20/10 1206 08/20/10 1207 08/20/10 1208  BP: 153/89 111/67 109/73 119/72  Pulse: 81 82 83 84  Height:      Weight:       PHYSICAL EXAM: Well nourished, well developed, in no acute distress HEENT: normal Neck: no JVD Endocrine:  No thyromegaly Cardiac:  normal S1, S2; RRR; no murmur Lungs:  clear to auscultation bilaterally, no wheezing, rhonchi or rales Abd: soft, nontender, no hepatomegaly Ext: no edema Skin: warm and dry Neuro:  CNs 2-12 intact, no focal abnormalities noted Psych: normal affect  EKG:  Normal sinus rhythm, heart rate 82, normal axis, poor R-wave progression, nonspecific ST-T wave changes, prolonged QT with a QTC of 493 ms  ASSESSMENT AND PLAN:

## 2010-08-20 NOTE — Assessment & Plan Note (Addendum)
I suspect she has autonomic insufficiency from her diabetes.  I discussed her case with Dr. Johnsie Cancel.  He suggested we put her on Midodrine 10 mg TID and have her see Dr. Caryl Comes for further recommendations.  I will also get labs:  BMET, LFTs, TSH, CBC.  I also spoke with Dr. Caryl Comes after the patient left the office.  After further discussion with Dr. Caryl Comes, we will start her on 5 mg TID and make sure she takes it when she is up and every 4 hours.  So, I will contact her and suggest she take the Midodrine at 7A, 11A and 3 P so she does not have significant supine HTN.  Dr. Caryl Comes also suggested she use compression stockings to her groin on bilateral legs and that she raise the head of her bed 6 inches.  I discussed with the patient hygiene when she was in the office.  She was advised to stand slowly, etc.  Of note, her Hgb came back at 7.8.  I contacted her PCP and the patient.  I advised the patient to not take Midodrine for now as she needs her anemia treated first.  I will get her a prescription for compression stockings.  Her creatinine was also 1.5.  I advised her to hold her HCTZ for 2 days and to drink plenty of fluids.  We will have her repeat her BMET in a week.  She will keep the appt with Dr. Caryl Comes.  If her orthostasis remains as significant at that time after her anemia is addressed, she could certainly try the Midodrine.

## 2010-08-20 NOTE — Telephone Encounter (Signed)
Left message for pt to call back re critical labs from today hgb 7.8 and hct 23.1    PT AWARE OF LAB RESULTS SEE NOTES ON LAB REPORT.  ALSO NEEDS REPEAT BMET  IN 1 WEEK PT TO CALL BACK LATER  TO SCHEDULE .Adonis Housekeeper

## 2010-08-20 NOTE — Telephone Encounter (Signed)
Holly [name] ar Dr Royal Piedra ofc stated they sent pt to a cardiologist because when she stood up, she got dizzy. Reason: hgb 7/1. Dr Jeralene Huff wants her seen immediately. Holly [name] reports pt has always been anemic, just not this low. In May she was 9.5. She recently started her period again after several months of no menses. She is supposed to be on Vit C and Iron, but sometimes she in non compliant. Pt has a hx with Dr Mitchell Heir in [place]. ECL 2010 that was normal.  Explained to Holly [name] that we have no openings tomorrow, pt is established with a GI md. Once her hgb is restored, if she wants to change her GI md to [place], we will see her. Holly [name] will inform the pt.

## 2010-08-21 MED ORDER — CARVEDILOL 12.5 MG PO TABS: 12.5000 mg | ORAL_TABLET | Freq: Two times a day (BID) | ORAL | Status: DC

## 2010-08-28 ENCOUNTER — Ambulatory Visit: Payer: Self-pay | Admitting: Gastroenterology

## 2010-09-03 ENCOUNTER — Encounter: Payer: Self-pay | Admitting: Physician Assistant

## 2010-09-11 ENCOUNTER — Telehealth: Payer: Self-pay | Admitting: Internal Medicine

## 2010-09-11 NOTE — Telephone Encounter (Signed)
unsuccessfully attempts to reach patient through phone number provided. To make appt to see Dr. Caryl Comes.  Call 8/3, 8/8. 8/16. Nurse Alvis Lemmings  is aware .

## 2010-09-22 ENCOUNTER — Telehealth: Payer: Self-pay | Admitting: Internal Medicine

## 2010-10-30 ENCOUNTER — Ambulatory Visit (INDEPENDENT_AMBULATORY_CARE_PROVIDER_SITE_OTHER): Payer: 59 | Admitting: Internal Medicine

## 2010-10-30 ENCOUNTER — Encounter: Payer: Self-pay | Admitting: Internal Medicine

## 2010-10-30 DIAGNOSIS — I071 Rheumatic tricuspid insufficiency: Secondary | ICD-10-CM

## 2010-10-30 DIAGNOSIS — I951 Orthostatic hypotension: Secondary | ICD-10-CM

## 2010-10-30 DIAGNOSIS — D649 Anemia, unspecified: Secondary | ICD-10-CM

## 2010-10-30 DIAGNOSIS — D631 Anemia in chronic kidney disease: Secondary | ICD-10-CM | POA: Insufficient documentation

## 2010-10-30 DIAGNOSIS — I517 Cardiomegaly: Secondary | ICD-10-CM | POA: Insufficient documentation

## 2010-10-30 DIAGNOSIS — I10 Essential (primary) hypertension: Secondary | ICD-10-CM

## 2010-10-30 DIAGNOSIS — I079 Rheumatic tricuspid valve disease, unspecified: Secondary | ICD-10-CM

## 2010-10-30 MED ORDER — PYRIDOSTIGMINE BROMIDE 60 MG PO TABS: ORAL_TABLET | ORAL | Status: DC

## 2010-10-30 MED ORDER — CLONIDINE HCL 0.1 MG PO TABS: 0.1000 mg | ORAL_TABLET | Freq: Once | ORAL | Status: DC

## 2010-10-30 MED ORDER — CARVEDILOL 25 MG PO TABS: 25.0000 mg | ORAL_TABLET | Freq: Two times a day (BID) | ORAL | Status: DC

## 2010-10-30 NOTE — Assessment & Plan Note (Addendum)
His severe and uncontrolled hypertension. We gave her clonidine today to decrease it in the office. I have discussed this with Dr. Lauris Poag doing with [facility]. We'll plan to discontinue her hydrochlorothiazide. Will increase her carvedilol from 12.5-25 mg twice daily. She is to get a 24-hour urine catecholamine collection and will undertake renal Dopplers or vascular hypertension.  Her creatinine was 1.5 in July. We will repeat today

## 2010-10-30 NOTE — Assessment & Plan Note (Signed)
The cause of this is unclear. I would recommend referral to hematology as this is a persistent issue without explanation and a negative GI evaluation.

## 2010-10-30 NOTE — Progress Notes (Signed)
History of Present Illness: Primary Cardiologist:  Holly [name] is a 45 y.o. female seen back for dizziness.  She saw Dr. Johnsie Cancel for the first time in June 2012.  There was a question of whether or not the patient has diastolic heart failure.  Echocardiogram done 12/11:   She has had problems with dizziness with standing for a month or two.  She feels lightheaded with standing and near syncopal.  She denies syncope.  When she saw Dr. Johnsie Cancel, her Spironolactone was discontinued.  She was also asked to take Lasix as needed.  She has not really been taking it that much.  She is still on HCTZ.  She denies chest pain or dyspnea.  She denies any recent illnesses. She does have some nausea and vomiting.  Also notes some abdominal pain.  She sees gastroenterology next week.    HPI: Holly [name] is a 45 y.o. female At the request of Dr. Mariana Single because of dizziness in the setting of severe hypertension and long-standing diabetes.  Her diabetes history goes back about 10 years. She is taken care of at [facility] but in [place] was discharged   for her noncompliance. A couple of years ago she was noted to have significant hypertension and this has been a progressive problem. She has been having problems with fluid overload and limitations of activity which she ascribes to weakness but not dyspnea. She has significant peripheral edema management with diuretics has been complicated by orthostatic intolerance. She did wear support stockings. She is also significant anemia and has been iron deficient. Her GI evaluation was negative the patient is not aware of an alternative explanation. She has a history of exertional neck discomfort  Cardiac evaluation thus far  demonstrates by echo December 2011 EF 60-65%, severe LVH, mild BAE, grade 3 diastolic dysfunction, severe tricuspid regurgitation, PASP 59, small pericardial effusion, moderate pulmonary hypertension.   Records were obtained from  [facility] in [facility].  She had a cardiac catheterization 07/26/08 that demonstrated normal coronary arteries.  She also had a right heart catheterization (PA 35/18, wedge pressure mean 9, cardiac output 6.2).  LV function was normal.    He denies a history of snoring. He has not had any evaluation of which she is aware looking for secondary causes of hypertension.  Blood work from July demonstrated a hemoglobin of 7.8 prompting.transfusion; creatinine of 1.5   Current Outpatient Prescriptions  Medication Sig Dispense Refill  . benazepril (LOTENSIN) 40 MG tablet Take 40 mg by mouth daily.       . calcium carbonate (OS-CAL) 600 MG TABS Take 600 mg by mouth 2 (two) times daily with a meal.        . ferrous sulfate 325 (65 FE) MG tablet Take 325 mg by mouth daily with breakfast.        . FLUoxetine (PROZAC) 20 MG capsule Take 20 mg by mouth daily.        . furosemide (LASIX) 20 MG tablet Take 10 mg by mouth daily.        . insulin detemir (LEVEMIR) 100 UNIT/ML injection Inject into the skin as directed.       . metFORMIN (GLUCOPHAGE) 1000 MG tablet Take 1,000 mg by mouth 2 (two) times daily with a meal.        . zolpidem (AMBIEN) 10 MG tablet Take 10 mg by mouth at bedtime as needed.        . carvedilol (COREG)  25 MG tablet Take 1 tablet (25 mg total) by mouth 2 (two) times daily with a meal.  60 tablet  11  . pyridostigmine (MESTINON) 60 MG tablet Take 1/2 tablet by mouth twice daily.  30 tablet  6   Current Facility-Administered Medications  Medication Dose Route Frequency Provider Last Rate Last Dose  . cloNIDine (CATAPRES) tablet 0.1 mg  0.1 mg Oral Once Deboraha Sprang, MD        Allergies  Allergen Reactions  . Bactrim   . Tekturna (Aliskiren Fumarate)     Past Medical History  Diagnosis Date  . DM2 (diabetes mellitus, type 2)   . Hypercholesteremia   . Chest pain   . Bulimia   . ADD (attention deficit disorder)   . Acute cystitis   . Anemia, unspecified   .  Anxiety state, unspecified   . Essential hypertension, benign   . Chronic ulcer of unspecified site   . Chronic inflammatory demyelinating polyneuritis   . Dysthymic disorder   . Peripheral autonomic neuropathy in disorders classified elsewhere   . Type II or unspecified type diabetes mellitus with other specified manifestations, not stated as uncontrolled     Cardiac catheterization 7/10 at [facility] cardiology in [facility]: Normal coronary arteries  . Background diabetic retinopathy   . Diarrhea     symptomatic  . Edema     moderate  . Esophageal reflux   . HLD (hyperlipidemia)     HDL goal >50, LDL goal <100  . Unspecified essential hypertension     Renal Dopplers 12/23/09 at [facility]ology in [facility]: No significant renal artery stenosis bilaterally  . Disorders of magnesium metabolism   . Insomnia, unspecified   . Irritable bowel syndrome   . Left heart failure   . Muscle weakness (generalized)   . Calculus of kidney   . Dysuria   . Palpitations   . Primary pulmonary hypertension     not seen at [facility]  (19mm Hg)  . Neuralgia, neuritis, and radiculitis, unspecified   . Restless legs syndrome (RLS)   . Encounter for long-term (current) use of other medications     Past Surgical History  Procedure Date  . Tonsillectomy and adenoidectomy   . Cesarean section     Family History  Problem Relation Age of Onset  . Hypertension Mother   . Myelodysplastic syndrome Father   . Pancreatic cancer      History   Social History  . Marital Status: Single    Spouse Name: N/A    Number of Children: 4  . Years of Education: N/A   Occupational History  . collections    Social History Main Topics  . Smoking status: Never Smoker   . Smokeless tobacco: Never Used  . Alcohol Use: No  . Drug Use: No  . Sexually Active: Not on file   Other Topics Concern  . Not on file   Social History Narrative  . No narrative on file    Fourteen point review of systems was  negative except as noted in HPI and PMH   PHYSICAL EXAMINATION  Blood pressure 175/94, pulse 84, height 5\' 7"  (1.702 m), weight 164 lb (74.39 kg).   Well developed and nourished in no acute distress HENT normal except for abnormal oral structures Neck supple with JVP-flat Carotids brisk and full without bruits Back without scoliosis or kyphosis Clear Regular rate and rhythm, no murmurs or gallops Abd-soft with active BS without hepatomegaly or midline  pulsation Femoral pulses 2+ distal pulses intact No Clubbing cyanosisthe necrosis 2+ peripheral edema and her legs are wrapped Skin-warm and dry LN-neg submandibular and supraclavicular A & Oriented CN 3-12 normal  Grossly normal sensory and motor function Affect engaging . Electrocardiogram demonstrates sinus rhythm at 83 Intervals 0.15 5.08/24 2 The axis is 12 Atrial enlargement

## 2010-10-30 NOTE — Patient Instructions (Addendum)
Your physician has recommended you make the following change in your medication:  1) Stop hydrochlorothiazide (HCTZ). 2) Start mestinon 30mg  one tablet by mouth twice daily. 3) Increase coreg (carvedilol) to 25mg  one tablet by mouth twice daily.  Your physician recommends that you have lab work: 24 hour urine for catecholamines./bmp/cbc  Your physician has requested that you have a renal artery duplex. During this test, an ultrasound is used to evaluate blood flow to the kidneys. Allow one hour for this exam. Do not eat after midnight the day before and avoid carbonated beverages. Take your medications as you usually do.  We will refer you to Dr. Jeneen Rinks Deterding (kidney doctor) for hypertension.  Your physician wants you to follow-up in: 4 months with Dr. Johnsie Cancel. You will receive a reminder letter in the mail two months in advance. If you don't receive a letter, please call our office to schedule the follow-up appointment.  Talk to your primary care doctor about setting you up for a referral to hematology.

## 2010-10-30 NOTE — Assessment & Plan Note (Signed)
She is severe left ventricular hypertrophy by echo but does not have also on electrocardiogram consistent with that. This is raises the possibility that the hypertrophy is pseudo-hypertrophy and biatrial larger raises the possibility of a restrictive pattern having said that however her blood pressures are so high that likely is reactive hypertrophy. This is something that disease be kept in mind. I'm not sure what why she has asuch significant tricuspid regurgitation given the normal size of her right ventricle.

## 2010-10-30 NOTE — Assessment & Plan Note (Addendum)
Will follow for right now. This may be partly why she has problems with significant peripheral edema.  I don't know whether the tricuspid regurgitation is sufficient to explain her exercise intolerance independent of her hypertrophic heart disease  With her pulmonary hypertension as suggested by Dr. Mariana Single, right heart catheterization may be in order.

## 2010-10-30 NOTE — Assessment & Plan Note (Signed)
She has ongoing problems with orthostatic hypotension. Even today her blood pressure dropped 50 points from .210-160 associated with some dizziness. However given her anemia, I would treat this first before we had another medication like Mestinon. I've asked her to follow up with her primary care physician and request referral to hematology for evaluation of her anemia. A barely her GI evaluation was negative

## 2010-10-30 NOTE — Assessment & Plan Note (Signed)
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

## 2010-10-31 LAB — CBC WITH DIFFERENTIAL/PLATELET
Basophils Absolute: 0.1 10*3/uL (ref 0.0–0.1)
Eosinophils Absolute: 0.8 10*3/uL — ABNORMAL HIGH (ref 0.0–0.7)
Hemoglobin: 9.7 g/dL — ABNORMAL LOW (ref 12.0–15.0)
Lymphocytes Relative: 20.1 % (ref 12.0–46.0)
MCHC: 33.8 g/dL (ref 30.0–36.0)
MCV: 88.7 fl (ref 78.0–100.0)
Monocytes Absolute: 0.3 10*3/uL (ref 0.1–1.0)
Neutro Abs: 4.4 10*3/uL (ref 1.4–7.7)
RDW: 13 % (ref 11.5–14.6)

## 2010-10-31 LAB — BASIC METABOLIC PANEL
CO2: 25 mEq/L (ref 19–32)
Calcium: 8.9 mg/dL (ref 8.4–10.5)
Chloride: 104 mEq/L (ref 96–112)
Creatinine, Ser: 1.5 mg/dL — ABNORMAL HIGH (ref 0.4–1.2)
Glucose, Bld: 80 mg/dL (ref 70–99)
Sodium: 137 mEq/L (ref 135–145)

## 2010-11-02 ENCOUNTER — Emergency Department (HOSPITAL_COMMUNITY): Payer: 59

## 2010-11-02 ENCOUNTER — Emergency Department (HOSPITAL_COMMUNITY)
Admission: EM | Admit: 2010-11-02 | Discharge: 2010-11-03 | Disposition: A | Discharge status: Home / Self Care | Payer: 59 | Attending: Emergency Medicine | Admitting: Emergency Medicine

## 2010-11-02 DIAGNOSIS — H8110 Benign paroxysmal vertigo, unspecified ear: Secondary | ICD-10-CM | POA: Insufficient documentation

## 2010-11-02 DIAGNOSIS — R9431 Abnormal electrocardiogram [ECG] [EKG]: Secondary | ICD-10-CM | POA: Insufficient documentation

## 2010-11-02 LAB — CBC
HCT: 31.6 % — ABNORMAL LOW (ref 36.0–46.0)
MCH: 29.5 pg (ref 26.0–34.0)
MCHC: 35.1 g/dL (ref 30.0–36.0)
RDW: 12.7 % (ref 11.5–15.5)

## 2010-11-02 LAB — URINALYSIS, ROUTINE W REFLEX MICROSCOPIC
Glucose, UA: NEGATIVE mg/dL
Ketones, ur: NEGATIVE mg/dL
Protein, ur: 100 mg/dL — AB
Urobilinogen, UA: 0.2 mg/dL (ref 0.0–1.0)

## 2010-11-02 LAB — COMPREHENSIVE METABOLIC PANEL
ALT: 9 U/L (ref 0–35)
AST: 12 U/L (ref 0–37)
Albumin: 3.9 g/dL (ref 3.5–5.2)
Alkaline Phosphatase: 52 U/L (ref 39–117)
Calcium: 10 mg/dL (ref 8.4–10.5)
Glucose, Bld: 88 mg/dL (ref 70–99)
Potassium: 4.3 mEq/L (ref 3.5–5.1)
Sodium: 141 mEq/L (ref 135–145)
Total Protein: 7.4 g/dL (ref 6.0–8.3)

## 2010-11-02 LAB — DIFFERENTIAL
Basophils Absolute: 0 10*3/uL (ref 0.0–0.1)
Basophils Relative: 1 % (ref 0–1)
Eosinophils Relative: 12 % — ABNORMAL HIGH (ref 0–5)
Monocytes Absolute: 0.5 10*3/uL (ref 0.1–1.0)
Monocytes Relative: 6 % (ref 3–12)

## 2010-11-02 LAB — CK TOTAL AND CKMB (NOT AT ARMC)
CK, MB: 2.8 ng/mL (ref 0.3–4.0)
Relative Index: INVALID (ref 0.0–2.5)
Total CK: 71 U/L (ref 7–177)

## 2010-11-02 LAB — POCT I-STAT TROPONIN I: Troponin i, poc: 0 ng/mL (ref 0.00–0.08)

## 2010-11-02 LAB — URINE MICROSCOPIC-ADD ON

## 2010-11-02 LAB — PREGNANCY, URINE: Preg Test, Ur: NEGATIVE

## 2010-11-03 LAB — URINE CULTURE
Colony Count: >=100000
Culture  Setup Time: 201210080222

## 2010-11-05 ENCOUNTER — Encounter: Payer: 59 | Admitting: Cardiology

## 2010-11-05 ENCOUNTER — Other Ambulatory Visit: Payer: Self-pay | Admitting: *Deleted

## 2010-11-05 ENCOUNTER — Encounter (INDEPENDENT_AMBULATORY_CARE_PROVIDER_SITE_OTHER): Payer: 59 | Admitting: Cardiology

## 2010-11-05 ENCOUNTER — Telehealth: Payer: Self-pay | Admitting: Internal Medicine

## 2010-11-05 ENCOUNTER — Other Ambulatory Visit: Payer: Self-pay | Admitting: Internal Medicine

## 2010-11-05 DIAGNOSIS — R109 Unspecified abdominal pain: Secondary | ICD-10-CM

## 2010-11-05 DIAGNOSIS — I10 Essential (primary) hypertension: Secondary | ICD-10-CM

## 2010-11-05 NOTE — Telephone Encounter (Signed)
Pt calling regarding referral for blood pressure specialists. Pt was told that Dr. Caryl Comes would refer her and set her up an appt, yet pt has not heard back. Please return pt call to discuss further.

## 2010-11-05 NOTE — Telephone Encounter (Signed)
I talked with pt. Dr Caryl Comes referred pt to Dr Deterding and pt has not heard about this appt yet. I will send a message to [facility] to follow-up on this for pt.

## 2010-11-07 ENCOUNTER — Telehealth: Payer: Self-pay | Admitting: Internal Medicine

## 2010-11-07 NOTE — Telephone Encounter (Signed)
Appt with Dr. Jimmy Footman on 11/10/10. The patient is aware per [facility] )

## 2010-11-07 NOTE — Telephone Encounter (Signed)
Pt needs to know if referral has been made to kidney doctor.  Please call back with info.  Ok to leave info with son if she is not at the number.

## 2010-11-10 LAB — CATECHOLAMINES, FRACTIONATED, URINE, 24 HOUR
Calculated Total (E+NE): 11 mcg/24 h — ABNORMAL LOW (ref 26–121)
Total Volume - CF 24Hr U: 2000 mL

## 2010-11-11 LAB — POCT RAPID STREP A: Streptococcus, Group A Screen (Direct): NEGATIVE

## 2010-11-25 ENCOUNTER — Telehealth: Payer: Self-pay | Admitting: Cardiovascular Disease

## 2010-11-25 NOTE — Telephone Encounter (Signed)
Spoke with pt, she would like to see a primary care dr at [address] location. Will give them a call tomorrow [name]

## 2010-11-25 NOTE — Telephone Encounter (Signed)
Pt calling wanting to know if we can talk to someone in [facility] to get her an appt, not necessarily with Dr. Shawna Orleans. Please return pt call to discuss.

## 2010-11-26 NOTE — Telephone Encounter (Signed)
Spoke with scheduler at the [place] office, according to the pt electronic chart the pt was discharged from that practice in oct, therefore they will not schedule any appt for the pt. Left message of above for pt and told her to call me back if she wanted to go to a different location [facility]

## 2010-11-28 ENCOUNTER — Encounter: Payer: Self-pay | Admitting: *Deleted

## 2010-11-28 ENCOUNTER — Other Ambulatory Visit: Payer: Self-pay | Admitting: Cardiovascular Disease

## 2010-11-28 MED ORDER — FUROSEMIDE 20 MG PO TABS: 10.0000 mg | ORAL_TABLET | Freq: Every day | ORAL | Status: DC

## 2010-11-28 MED ORDER — BENAZEPRIL HCL 40 MG PO TABS: 40.0000 mg | ORAL_TABLET | Freq: Every day | ORAL | Status: DC

## 2010-12-15 ENCOUNTER — Telehealth: Payer: Self-pay | Admitting: Cardiovascular Disease

## 2010-12-15 DIAGNOSIS — I10 Essential (primary) hypertension: Secondary | ICD-10-CM

## 2010-12-15 NOTE — Telephone Encounter (Signed)
New problem Pt said furosemide not working at this dose. She doesn't have number to call her back. She will call back in morning

## 2010-12-16 NOTE — Telephone Encounter (Signed)
Fu call Pt has phone now please call about furosemide dosage

## 2010-12-16 NOTE — Telephone Encounter (Signed)
Patient called no answer,lmtc

## 2010-12-16 NOTE — Telephone Encounter (Signed)
No answer lmtc

## 2010-12-16 NOTE — Telephone Encounter (Signed)
Patient called ok per Dr. Johnsie Cancel to take lasix 20 mgs twice daily and have bmet in 3 weeks.

## 2010-12-16 NOTE — Telephone Encounter (Signed)
That's ok  Check bmet in 3 weeks

## 2010-12-16 NOTE — Telephone Encounter (Signed)
Patient called, stated she increased her lasix back to 20mg  twice daily, due to increase swelling on 10mg s.She wanted to make sure this ok with Dr. Johnsie Cancel.Call her back on phone number [phone].

## 2011-06-11 ENCOUNTER — Other Ambulatory Visit: Payer: Self-pay | Admitting: Family Medicine

## 2011-06-11 DIAGNOSIS — N6459 Other signs and symptoms in breast: Secondary | ICD-10-CM

## 2011-06-11 DIAGNOSIS — N6452 Nipple discharge: Secondary | ICD-10-CM

## 2011-07-19 ENCOUNTER — Emergency Department (INDEPENDENT_AMBULATORY_CARE_PROVIDER_SITE_OTHER)
Admission: EM | Admit: 2011-07-19 | Discharge: 2011-07-19 | Disposition: A | Discharge status: Nursing Home (Includes ICF) | Payer: BC Managed Care – PPO | Source: Home / Self Care

## 2011-07-19 ENCOUNTER — Encounter (HOSPITAL_COMMUNITY): Payer: Self-pay | Admitting: Emergency Medicine

## 2011-07-19 ENCOUNTER — Encounter (HOSPITAL_COMMUNITY): Payer: Self-pay | Admitting: *Deleted

## 2011-07-19 ENCOUNTER — Inpatient Hospital Stay (HOSPITAL_COMMUNITY)
Admission: EM | Admit: 2011-07-19 | Discharge: 2011-07-20 | DRG: 369 | Disposition: A | Discharge status: Home / Self Care | Payer: BC Managed Care – PPO | Attending: Obstetrics & Gynecology | Admitting: Obstetrics & Gynecology

## 2011-07-19 DIAGNOSIS — N179 Acute kidney failure, unspecified: Secondary | ICD-10-CM

## 2011-07-19 DIAGNOSIS — E7800 Pure hypercholesterolemia, unspecified: Secondary | ICD-10-CM | POA: Diagnosis present

## 2011-07-19 DIAGNOSIS — N920 Excessive and frequent menstruation with regular cycle: Principal | ICD-10-CM | POA: Diagnosis present

## 2011-07-19 DIAGNOSIS — D649 Anemia, unspecified: Secondary | ICD-10-CM

## 2011-07-19 DIAGNOSIS — N289 Disorder of kidney and ureter, unspecified: Secondary | ICD-10-CM

## 2011-07-19 DIAGNOSIS — I951 Orthostatic hypotension: Secondary | ICD-10-CM

## 2011-07-19 DIAGNOSIS — N939 Abnormal uterine and vaginal bleeding, unspecified: Secondary | ICD-10-CM

## 2011-07-19 DIAGNOSIS — E119 Type 2 diabetes mellitus without complications: Secondary | ICD-10-CM | POA: Diagnosis present

## 2011-07-19 DIAGNOSIS — I10 Essential (primary) hypertension: Secondary | ICD-10-CM

## 2011-07-19 LAB — DIFFERENTIAL
Basophils Absolute: 0 10*3/uL (ref 0.0–0.1)
Lymphocytes Relative: 15 % (ref 12–46)
Lymphs Abs: 1.2 10*3/uL (ref 0.7–4.0)
Monocytes Absolute: 0.4 10*3/uL (ref 0.1–1.0)
Neutro Abs: 5.6 10*3/uL (ref 1.7–7.7)

## 2011-07-19 LAB — CBC
HCT: 18.1 % — ABNORMAL LOW (ref 36.0–46.0)
MCV: 84.2 fL (ref 78.0–100.0)
Platelets: 176 10*3/uL (ref 150–400)
RBC: 2.15 MIL/uL — ABNORMAL LOW (ref 3.87–5.11)
RDW: 13.6 % (ref 11.5–15.5)
WBC: 7.8 10*3/uL (ref 4.0–10.5)

## 2011-07-19 LAB — POCT I-STAT, CHEM 8
BUN: 66 mg/dL — ABNORMAL HIGH (ref 6–23)
Creatinine, Ser: 1.9 mg/dL — ABNORMAL HIGH (ref 0.50–1.10)
Potassium: 4.8 mEq/L (ref 3.5–5.1)
Sodium: 138 mEq/L (ref 135–145)
TCO2: 22 mmol/L (ref 0–100)

## 2011-07-19 LAB — URINE MICROSCOPIC-ADD ON

## 2011-07-19 LAB — BASIC METABOLIC PANEL
BUN: 60 mg/dL — ABNORMAL HIGH (ref 6–23)
CO2: 24 mEq/L (ref 19–32)
Calcium: 8.5 mg/dL (ref 8.4–10.5)
Creatinine, Ser: 1.95 mg/dL — ABNORMAL HIGH (ref 0.50–1.10)
GFR calc Af Amer: 34 mL/min — ABNORMAL LOW (ref >90)

## 2011-07-19 LAB — URINALYSIS, ROUTINE W REFLEX MICROSCOPIC
Bilirubin Urine: NEGATIVE
Ketones, ur: NEGATIVE mg/dL
Nitrite: NEGATIVE
pH: 6.5 (ref 5.0–8.0)

## 2011-07-19 LAB — PREPARE RBC (CROSSMATCH)

## 2011-07-19 MED ORDER — CIPROFLOXACIN IN D5W 400 MG/200ML IV SOLN
400.0000 mg | Freq: Once | INTRAVENOUS | Status: AC
  Administered 2011-07-19: 400 mg via INTRAVENOUS
  Filled 2011-07-19: qty 200

## 2011-07-19 MED ORDER — FLUOXETINE HCL 20 MG PO CAPS
20.0000 mg | ORAL_CAPSULE | Freq: Every morning | ORAL | Status: DC
  Administered 2011-07-20: 20 mg via ORAL
  Filled 2011-07-19 (×2): qty 1

## 2011-07-19 MED ORDER — NORETHINDRONE ACETATE 5 MG PO TABS
5.0000 mg | ORAL_TABLET | Freq: Two times a day (BID) | ORAL | Status: DC
  Administered 2011-07-19 – 2011-07-20 (×2): 5 mg via ORAL
  Filled 2011-07-19 (×4): qty 1

## 2011-07-19 MED ORDER — DIPHENHYDRAMINE HCL 25 MG PO CAPS
25.0000 mg | ORAL_CAPSULE | Freq: Once | ORAL | Status: AC
  Administered 2011-07-19: 25 mg via ORAL
  Filled 2011-07-19: qty 1

## 2011-07-19 MED ORDER — FUROSEMIDE 20 MG PO TABS
10.0000 mg | ORAL_TABLET | Freq: Every day | ORAL | Status: DC
  Administered 2011-07-20: 12:00:00 via ORAL
  Filled 2011-07-19 (×2): qty 0.5

## 2011-07-19 MED ORDER — BENAZEPRIL HCL 10 MG PO TABS
40.0000 mg | ORAL_TABLET | Freq: Every day | ORAL | Status: DC
Start: 2011-07-20 — End: 2011-07-20
  Administered 2011-07-20: 40 mg via ORAL
  Filled 2011-07-19 (×2): qty 4

## 2011-07-19 MED ORDER — METFORMIN HCL 500 MG PO TABS
1000.0000 mg | ORAL_TABLET | Freq: Two times a day (BID) | ORAL | Status: DC
  Filled 2011-07-19 (×3): qty 2

## 2011-07-19 MED ORDER — ZOLPIDEM TARTRATE 5 MG PO TABS
5.0000 mg | ORAL_TABLET | Freq: Every evening | ORAL | Status: DC | PRN
  Filled 2011-07-19: qty 1

## 2011-07-19 MED ORDER — SODIUM CHLORIDE 0.9 % IV SOLN: Freq: Once | INTRAVENOUS | Status: DC

## 2011-07-19 MED ORDER — BENAZEPRIL HCL 40 MG PO TABS: 40.0000 mg | ORAL_TABLET | Freq: Every day | ORAL | Status: DC

## 2011-07-19 MED ORDER — CARVEDILOL 25 MG PO TABS
25.0000 mg | ORAL_TABLET | Freq: Two times a day (BID) | ORAL | Status: DC
  Filled 2011-07-19 (×3): qty 1

## 2011-07-19 MED ORDER — CLONIDINE HCL 0.1 MG PO TABS
0.1000 mg | ORAL_TABLET | Freq: Once | ORAL | Status: DC
  Filled 2011-07-19: qty 1

## 2011-07-19 MED ORDER — CARVEDILOL 25 MG PO TABS
25.0000 mg | ORAL_TABLET | Freq: Once | ORAL | Status: AC
  Administered 2011-07-20: 25 mg via ORAL
  Filled 2011-07-19: qty 1

## 2011-07-19 MED ORDER — METFORMIN HCL 500 MG PO TABS
1000.0000 mg | ORAL_TABLET | Freq: Once | ORAL | Status: AC
  Administered 2011-07-20: 1000 mg via ORAL
  Filled 2011-07-19: qty 2

## 2011-07-19 MED ORDER — INSULIN DETEMIR 100 UNIT/ML ~~LOC~~ SOLN
10.0000 [IU] | Freq: Two times a day (BID) | SUBCUTANEOUS | Status: DC
  Filled 2011-07-19: qty 10

## 2011-07-19 MED ORDER — SODIUM CHLORIDE 0.9 % IV SOLN
INTRAVENOUS | Status: DC
  Administered 2011-07-19: via INTRAVENOUS

## 2011-07-19 MED ORDER — SODIUM CHLORIDE 0.9 % IV BOLUS (SEPSIS)
1000.0000 mL | Freq: Once | INTRAVENOUS | Status: AC
  Administered 2011-07-19 (×2): 1000 mL via INTRAVENOUS

## 2011-07-19 MED ORDER — AMLODIPINE BESYLATE 5 MG PO TABS
5.0000 mg | ORAL_TABLET | Freq: Every day | ORAL | Status: DC
  Filled 2011-07-19 (×2): qty 1

## 2011-07-19 MED ORDER — ACETAMINOPHEN 325 MG PO TABS
650.0000 mg | ORAL_TABLET | Freq: Once | ORAL | Status: AC
  Administered 2011-07-19: 650 mg via ORAL
  Filled 2011-07-19: qty 2

## 2011-07-19 NOTE — ED Notes (Signed)
Pt with onset of vaginal bleeding Friday increased bleeding Saturday with large clots - pt with history of anemia prior to the vaginal bleeding - per pt periods had stopped x 8 months - period in may heavy lasted approx 6 days - this episode much worse - per pt has used approx 4 - 5 pads today - per pt has appt hematologist at [facility] to investigate anemia cause - per pt last hgb 8 - takes otc iron

## 2011-07-19 NOTE — ED Notes (Signed)
Patient advised that when she stands up when she gets up she gets dizzy and feels like she is going to black out.

## 2011-07-19 NOTE — ED Notes (Signed)
[facility] called and truck was 30 minutes out.  [facility] called instead

## 2011-07-19 NOTE — ED Notes (Signed)
1000 cc of NS has infused and the second 1000cc bag of NS was hung.

## 2011-07-19 NOTE — ED Notes (Signed)
Patient has has a total of 4 attempts for a second IV. The IV team attempted times two without success. A second IV tech was call to attempt.

## 2011-07-19 NOTE — ED Provider Notes (Signed)
Pt seen with [facility] for vaginal bleeding and anemia She does not think she is pregnant As soon as urine preg is resulted will need urgent gyn consultation  Sharyon Cable, MD 07/19/11 1644

## 2011-07-19 NOTE — ED Notes (Signed)
Pt placed on 02 at 2lpm nasal canula  Per RN Santiago Glad

## 2011-07-19 NOTE — ED Notes (Signed)
Patient brought in via [facility] with complaints of vaginal bleeding since Friday. She was seen at the urgent care facility and transferred here for further evaluation and treatment. EMS reports a HGB of 6.1 and patient has a 20 gauge in the right [facility], placed by Urgent Care staff.

## 2011-07-19 NOTE — ED Notes (Signed)
[facility] [place] [facility] called for transport with reports of being 2 hours for pt to be transported. Charge RN made aware and [facility] with be called for pt transfer.

## 2011-07-19 NOTE — ED Notes (Signed)
Pt presented [facility] with c/o dizziness/weakness /vaginal bleeding - per pt has history of anemia last hgb 8 - per pt had not had a period 8 mos - had one last month and again this month started on Friday - heavy bleeding with clots - denies pain - used 4 pads today - skin pale - orthostatic - cm nsr without ectopy skin warm dry pale

## 2011-07-19 NOTE — ED Notes (Signed)
Placed Pt on heart monitor

## 2011-07-19 NOTE — ED Notes (Signed)
Pt resting quietly no s/s of any pain or distress observed. Pt denies any pain, shortness of breath, itching or any s/s of allergic reaction. Pt is receiving blood with no s/s of allergic reaction observed, infusing with no s/s of any infiltration. Plan of care is updated with verbal understanding and will continue to monitor pt.

## 2011-07-19 NOTE — ED Provider Notes (Signed)
History     CSN: [id number]  Arrival date & time 07/19/11  1538   First MD Initiated Contact with Patient 07/19/11 1542      Chief Complaint  Patient presents with  . Vaginal Bleeding    (Consider location/radiation/quality/duration/timing/severity/associated sxs/prior treatment) HPI  Patient who has known anemia that has required transfusion in the past, approximately a year ago, and who has had initial anemia workup started by her primary care provider and at prior hospitalizations without any clear cause of her anemia presents to emergency department complaining of a few day onset of dizziness, and return of vaginal bleeding. Patient states she's scheduled to see a hematologist at [facility] on July 7 for further workup of her anemia of unclear origin. Patient states that she went 8-9 months without having any menstruation but last month had a week long of a heavy cycle. 3 days ago patient states she began bleeding once again with heavy menstruation requiring 4 soaked through pads today. Patient went to the urgent care for symptoms of gradual onset but worsening lightheadedness especially with changing of positions and heavy vaginal bleeding. Urgent care found patient to be orthostatic with a hemoglobin of 6 and transported her to the emergency department for further evaluation and management. Patient states that at baseline she's had a hemoglobin of 8 for many months. Patient denies fevers, chills, chest pain, shortness of breath, rectal bleeding, or bleeding of any other origin other than vaginal. Patient states that her during her last hospitalization she did have a gastroenterology workup and had GI bleed ruled out.  Past Medical History  Diagnosis Date  . DM2 (diabetes mellitus, type 2)   . Hypercholesteremia   . Chest pain   . Bulimia   . ADD (attention deficit disorder)   . Acute cystitis   . Anemia, unspecified   . Anxiety state, unspecified   . Essential hypertension,  benign   . Chronic ulcer of unspecified site   . Chronic inflammatory demyelinating polyneuritis   . Dysthymic disorder   . Peripheral autonomic neuropathy in disorders classified elsewhere   . Type II or unspecified type diabetes mellitus with other specified manifestations, not stated as uncontrolled     Cardiac catheterization 7/10 at [facility]ology in [facility]: Normal coronary arteries  . Background diabetic retinopathy   . Diarrhea     symptomatic  . Edema     moderate  . Esophageal reflux   . HLD (hyperlipidemia)     HDL goal >50, LDL goal <100  . Unspecified essential hypertension     Renal Dopplers 12/23/09 at [facility] cardiology in [facility]: No significant renal artery stenosis bilaterally  . Disorders of magnesium metabolism   . Insomnia, unspecified   . Irritable bowel syndrome   . Left heart failure   . Muscle weakness (generalized)   . Calculus of kidney   . Dysuria   . Palpitations   . Primary pulmonary hypertension     not seen at CATH  (45mm Hg)  . Neuralgia, neuritis, and radiculitis, unspecified   . Restless legs syndrome (RLS)   . Encounter for long-term (current) use of other medications     Past Surgical History  Procedure Date  . Tonsillectomy and adenoidectomy   . Cesarean section     Family History  Problem Relation Age of Onset  . Hypertension Mother   . Myelodysplastic syndrome Father   . Pancreatic cancer      History  Substance Use Topics  .  Smoking status: Never Smoker   . Smokeless tobacco: Never Used  . Alcohol Use: No    OB History    Grav Para Term Preterm Abortions TAB SAB Ect Mult Living                  Review of Systems  All other systems reviewed and are negative.    Allergies  Bactrim and Tekturna  Home Medications   Current Outpatient Rx  Name Route Sig Dispense Refill  . BENAZEPRIL HCL 40 MG PO TABS Oral Take 1 tablet (40 mg total) by mouth daily. 30 tablet 12  . CALCIUM CARBONATE 600 MG PO TABS  Oral Take 600 mg by mouth 2 (two) times daily with a meal.      . CARVEDILOL 25 MG PO TABS Oral Take 1 tablet (25 mg total) by mouth 2 (two) times daily with a meal. 60 tablet 11  . FERROUS SULFATE 325 (65 FE) MG PO TABS Oral Take 325 mg by mouth daily with breakfast.      . FLUOXETINE HCL 20 MG PO CAPS Oral Take 20 mg by mouth daily.      . FUROSEMIDE 20 MG PO TABS Oral Take 0.5 tablets (10 mg total) by mouth daily. 30 tablet 12  . INSULIN DETEMIR 100 UNIT/ML [place] SOLN Subcutaneous Inject into the skin as directed.     Marland Kitchen METFORMIN HCL 1000 MG PO TABS Oral Take 1,000 mg by mouth 2 (two) times daily with a meal.      . PRESCRIPTION MEDICATION  5 mg 1 day or 1 dose. Amlodipine 5mg     . PYRIDOSTIGMINE BROMIDE 60 MG PO TABS  Take 1/2 tablet by mouth twice daily. 30 tablet 6  . ZOLPIDEM TARTRATE 10 MG PO TABS Oral Take 10 mg by mouth at bedtime as needed.        BP 149/79  Pulse 71  Temp 97.8 F (36.6 C) (Oral)  Resp 16  SpO2 100%  LMP 07/17/2011  Physical Exam  Nursing note and vitals reviewed. Constitutional: She is oriented to person, place, and time. She appears well-developed and well-nourished. No distress.       Pale appearing.   HENT:  Head: Normocephalic and atraumatic.  Eyes:       Pale conjunctiva  Neck: Normal range of motion. Neck supple.  Cardiovascular: Normal rate, regular rhythm, normal heart sounds and intact distal pulses.  Exam reveals no gallop and no friction rub.   No murmur heard. Pulmonary/Chest: Effort normal and breath sounds normal. No respiratory distress. She has no wheezes. She has no rales. She exhibits no tenderness.  Abdominal: Soft. Bowel sounds are normal. She exhibits no distension and no mass. There is no tenderness. There is no rebound and no guarding.  Genitourinary:       Copious thin bright red blood in vaginal vault obstructing view of cervix even after attempts to remove blood from vault. Cervix palpated with os closed. No CMT or adnexal TTP.     Musculoskeletal: Normal range of motion. She exhibits no edema and no tenderness.  Neurological: She is alert and oriented to person, place, and time.  Skin: Skin is warm and dry. No rash noted. She is not diaphoretic. No erythema.  Psychiatric: She has a normal mood and affect.    ED Course  Procedures (including critical care time)  Patient evaluated by Dr. Christy Gentles. 2 large bore IVs to be started with bolus fluids while waiting on 2 units of  blood from blood bank who state there will be an hour delay due to antibodies in patient's blood. VSS while lying in bed.   Labs Reviewed  CBC - Abnormal; Notable for the following:    RBC 2.15 (*)     Hemoglobin 6.3 (*)     HCT 18.1 (*)     All other components within normal limits  DIFFERENTIAL - Abnormal; Notable for the following:    Eosinophils Relative 8 (*)     All other components within normal limits  BASIC METABOLIC PANEL - Abnormal; Notable for the following:    Glucose, Bld 189 (*)     BUN 60 (*)     Creatinine, Ser 1.95 (*)     GFR calc non Af Amer 29 (*)     GFR calc Af Amer 34 (*)     All other components within normal limits  URINALYSIS, ROUTINE W REFLEX MICROSCOPIC - Abnormal; Notable for the following:    Color, Urine AMBER (*)  BIOCHEMICALS MAY BE AFFECTED BY COLOR   APPearance CLOUDY (*)     Glucose, UA 100 (*)     Hgb urine dipstick LARGE (*)     Protein, ur >300 (*)     Leukocytes, UA LARGE (*)     All other components within normal limits  URINE MICROSCOPIC-ADD ON - Abnormal; Notable for the following:    Squamous Epithelial / LPF FEW (*)     All other components within normal limits  TYPE AND SCREEN  PREPARE RBC (CROSSMATCH)  PREGNANCY, URINE  URINE CULTURE   No results found.   1. Anemia   2. Vaginal bleeding   3. Renal insufficiency     MDM  Patient is to be transferred to [facility] hospital with the excepting physician, Dr. Benjie Karvonen. Dr. Christy Gentles is agreeable to assessment and plan. Patient has heavy  vaginal bleeding with a worsening of her baseline anemia. Transfusion has been started in ER and vital signs have been monitored. Patient's vital signs are stable. She is stable for transfer.        Anne Ng, [place] 07/19/11 [place], [place] 07/19/11 [place], [place] 07/19/11 2122

## 2011-07-19 NOTE — ED Notes (Signed)
Evaluation of patient requested by registration staff. Female pt reports dizziness x2 days along with heavy menstrual bleeding with clots. Pt vitals obtained, and she is hypotensive compared to vitals hx.  Bringing pt to exam room for expedited triage by RN.

## 2011-07-19 NOTE — ED Provider Notes (Signed)
History     CSN: [id number]  Arrival date & time 07/19/11  1351   First MD Initiated Contact with Patient 07/19/11 1418      Chief Complaint  Patient presents with  . Vaginal Bleeding  . Dizziness    (Consider location/radiation/quality/duration/timing/severity/associated sxs/prior treatment) HPI 46 year old Caucasian female with history of hypertension, diabetes type 2, hypercholesterolemia, anemia who presents with the above complaints.  She reported that for about 8 months she has not had her menstrual cycle.  About a month ago she had her menstrual cycle.  Which we started 2 days ago.  Since her menstrual cycle started again she has been feeling dizzy and lightheaded.  She felt like she was pass out at times.  As a result she presented to the urgent care for further evaluation.  In the urgent care she was found to be orthostatic with a blood pressure of 81/50 while standing, laying blood pressure was 113/63.  Hemoglobin was checked and was 6.1.  Past Medical History  Diagnosis Date  . DM2 (diabetes mellitus, type 2)   . Hypercholesteremia   . Chest pain   . Bulimia   . ADD (attention deficit disorder)   . Acute cystitis   . Anemia, unspecified   . Anxiety state, unspecified   . Essential hypertension, benign   . Chronic ulcer of unspecified site   . Chronic inflammatory demyelinating polyneuritis   . Dysthymic disorder   . Peripheral autonomic neuropathy in disorders classified elsewhere   . Type II or unspecified type diabetes mellitus with other specified manifestations, not stated as uncontrolled     Cardiac catheterization 7/10 at [facility] cardiology in [facility]: Normal coronary arteries  . Background diabetic retinopathy   . Diarrhea     symptomatic  . Edema     moderate  . Esophageal reflux   . HLD (hyperlipidemia)     HDL goal >50, LDL goal <100  . Unspecified essential hypertension     Renal Dopplers 12/23/09 at [facility] cardiology in [facility]: No  significant renal artery stenosis bilaterally  . Disorders of magnesium metabolism   . Insomnia, unspecified   . Irritable bowel syndrome   . Left heart failure   . Muscle weakness (generalized)   . Calculus of kidney   . Dysuria   . Palpitations   . Primary pulmonary hypertension     not seen at CATH  (64mm Hg)  . Neuralgia, neuritis, and radiculitis, unspecified   . Restless legs syndrome (RLS)   . Encounter for long-term (current) use of other medications     Past Surgical History  Procedure Date  . Tonsillectomy and adenoidectomy   . Cesarean section     Family History  Problem Relation Age of Onset  . Hypertension Mother   . Myelodysplastic syndrome Father   . Pancreatic cancer      History  Substance Use Topics  . Smoking status: Never Smoker   . Smokeless tobacco: Never Used  . Alcohol Use: No    OB History    Grav Para Term Preterm Abortions TAB SAB Ect Mult Living                  Review of Systems  Constitutional: Positive for fatigue.  HENT: Negative.   Eyes: Negative.   Respiratory: Negative.   Cardiovascular:       Reported having chest palpitations last night which she initially denied to me.  Gastrointestinal: Negative.   Genitourinary: Negative.   Musculoskeletal:  Negative.   Skin: Positive for pallor. Negative for color change, rash and wound.  Neurological: Positive for dizziness and light-headedness.  Hematological: Negative.   Psychiatric/Behavioral: Negative.     Allergies  Bactrim and Tekturna  Home Medications   Current Outpatient Rx  Name Route Sig Dispense Refill  . BENAZEPRIL HCL 40 MG PO TABS Oral Take 1 tablet (40 mg total) by mouth daily. 30 tablet 12  . CARVEDILOL 25 MG PO TABS Oral Take 1 tablet (25 mg total) by mouth 2 (two) times daily with a meal. 60 tablet 11  . FERROUS SULFATE 325 (65 FE) MG PO TABS Oral Take 325 mg by mouth daily with breakfast.      . FLUOXETINE HCL 20 MG PO CAPS Oral Take 20 mg by mouth daily.       . FUROSEMIDE 20 MG PO TABS Oral Take 0.5 tablets (10 mg total) by mouth daily. 30 tablet 12  . INSULIN DETEMIR 100 UNIT/ML [place] SOLN Subcutaneous Inject into the skin as directed.     Marland Kitchen METFORMIN HCL 1000 MG PO TABS Oral Take 1,000 mg by mouth 2 (two) times daily with a meal.      . PRESCRIPTION MEDICATION  5 mg 1 day or 1 dose. Amlodipine 5mg     . CALCIUM CARBONATE 600 MG PO TABS Oral Take 600 mg by mouth 2 (two) times daily with a meal.      . PYRIDOSTIGMINE BROMIDE 60 MG PO TABS  Take 1/2 tablet by mouth twice daily. 30 tablet 6  . ZOLPIDEM TARTRATE 10 MG PO TABS Oral Take 10 mg by mouth at bedtime as needed.        BP 81/50  Pulse 73  Temp 97.9 F (36.6 C) (Oral)  Resp 16  SpO2 100%  LMP 07/17/2011  Physical Exam  Vitals reviewed. Constitutional: She is oriented to person, place, and time. She appears well-developed and well-nourished.  HENT:  Head: Normocephalic and atraumatic.  Eyes: Conjunctivae are normal. Pupils are equal, round, and reactive to light. No scleral icterus.  Neck: Normal range of motion.  Cardiovascular: Normal rate and regular rhythm.   Pulmonary/Chest: Effort normal.  Abdominal: Soft. Bowel sounds are normal.  Musculoskeletal: Normal range of motion.  Neurological: She is alert and oriented to person, place, and time.  Skin: Skin is warm.  Psychiatric: She has a normal mood and affect.    ED Course  Procedures (including critical care time)  Labs Reviewed  POCT I-STAT, CHEM 8 - Abnormal; Notable for the following:    BUN 66 (*)     Creatinine, Ser 1.90 (*)     Glucose, Bld 180 (*)     Hemoglobin 6.1 (*)     HCT 18.0 (*)     All other components within normal limits   No results found.   1. Anemia   2. Orthostatic hypotension   3. ARF (acute renal failure)     MDM  46 year old Caucasian female with history of anemia with a recent referral to [facility] hematology for anemia workup.  Presents with heavy menses with anemia  with a hemoglobin of 6.1 with orthostatic hypotension.  Patient also has acute renal failure on chronic kidney disease stage III. Started the patient on fluids wide open.  We'll transfer the patient to emergency department by [facility].  Suspect patient will need blood transfusion for symptomatic anemia.  Patient has been taking ferrous sulfate for anemia for the last 2 days, consider checking anemia  panel before blood transfusion.  Further management at the emergency department.  Patient and son notified of the plan.  Bynum Bellows, MD 07/19/11 [phone]

## 2011-07-19 NOTE — H&P (Signed)
Holly [name] is an 46 y.o. female. Admitted to Gyn floor for menorrhagia following transfer from [facility] ED where she presented for menorrhagia, dizziness and was noted to be severely anemic.  Menses generally heavy but no periods in 8 months, got normal period last month, but heavy this cycle since 07/17/11, passing large clots and is dizzy.  G4P4 (1 SVD, 3 c/s- last 46 yo), currently sexually active with BF, no contraception (UPT neg in ED) Not seen a Gyn in several yrs due to financial reasons. No known fibroids. No abnormal Paps per her.  Normal mammograms, left breast inverted nipple, was advised f/up imaging in 2010, but has not returned.  Known Anemia, sees FP at [facility], Colonoscopy neg 2 yrs back, is awaiting Heme consult at [facility]. No Gynec work up for anemia since no menses in 8 months.  DM II, CHTN, DM retinopathy (saw Ophtho recently), DM kidney disease (sees Nephrology at [facility]) Medical hx reviewed (below)  Past Medical History  Diagnosis Date  . DM2 (diabetes mellitus, type 2)   . Hypercholesteremia   . Chest pain   . Bulimia   . ADD (attention deficit disorder)   . Acute cystitis   . Anemia, unspecified   . Anxiety state, unspecified   . Essential hypertension, benign   . Chronic ulcer of unspecified site   . Chronic inflammatory demyelinating polyneuritis   . Dysthymic disorder   . Peripheral autonomic neuropathy in disorders classified elsewhere   . Type II or unspecified type diabetes mellitus with other specified manifestations, not stated as uncontrolled     Cardiac catheterization 7/10 at [facility] cardiology in [facility]: Normal coronary arteries  . Background diabetic retinopathy   . Diarrhea     symptomatic  . Edema     moderate  . Esophageal reflux   . HLD (hyperlipidemia)     HDL goal >50, LDL goal <100  . Unspecified essential hypertension     Renal Dopplers 12/23/09 at [facility] cardiology in [facility]: No significant renal artery stenosis bilaterally    . Disorders of magnesium metabolism   . Insomnia, unspecified   . Irritable bowel syndrome   . Left heart failure   . Muscle weakness (generalized)   . Calculus of kidney   . Dysuria   . Palpitations   . Primary pulmonary hypertension     not seen at CATH  (56mm Hg)  . Neuralgia, neuritis, and radiculitis, unspecified   . Restless legs syndrome (RLS)   . Encounter for long-term (current) use of other medications     Past Surgical History  Procedure Date  . Tonsillectomy and adenoidectomy   . Cesarean section     Family History  Problem Relation Age of Onset  . Hypertension Mother   . Myelodysplastic syndrome Father   . Pancreatic cancer      Social History:  reports that she has never smoked. She has never used smokeless tobacco. She reports that she does not drink alcohol or use illicit drugs.  Allergies:  Allergies  Allergen Reactions  . Bactrim Nausea And Vomiting  . Tekturna (Aliskiren Fumarate) Rash    Prescriptions prior to admission  Medication Sig Dispense Refill  . amLODipine (NORVASC) 5 MG tablet Take 5 mg by mouth daily.      . benazepril (LOTENSIN) 40 MG tablet Take 40 mg by mouth daily.      Marland Kitchen Carbonyl Iron (PERFECT IRON PO) Take 1 tablet by mouth daily.      Marland Kitchen  carvedilol (COREG) 25 MG tablet Take 25 mg by mouth 2 (two) times daily with a meal.      . FLUoxetine (PROZAC) 20 MG capsule Take 20 mg by mouth every morning.       . furosemide (LASIX) 20 MG tablet Take 40 mg by mouth daily.      . insulin detemir (LEVEMIR) 100 UNIT/ML injection Inject 10 Units into the skin 2 (two) times daily.       . metFORMIN (GLUCOPHAGE) 1000 MG tablet Take 1,000 mg by mouth 2 (two) times daily with a meal.          Review of Systems  Constitutional: Negative for fever.  Respiratory: Negative for cough and shortness of breath.   Cardiovascular: Negative for chest pain.  Gastrointestinal: Negative for heartburn.  Neurological: Positive for dizziness and weakness.  Negative for headaches.  Endo/Heme/Allergies: Does not bruise/bleed easily.    Physical Exam Blood pressure 165/93, pulse 75, temperature 98.1 F (36.7 C), temperature source Oral, resp. rate 18, height 5\' 7"  (1.702 m), weight 77.111 kg (170 lb), last menstrual period 07/17/2011, SpO2 99.00% A&O x 3, no acute distress. Pleasant HEENT neg, no thyromegaly Lungs CTA bilat CV RRR, S1S2 normal Abdo soft, non tender, non acute Extr no edema/ tenderness Pelvic Deferred to AM after bleeding somewhat controlled.   Results for orders placed during the hospital encounter of 07/19/11 (from the past 24 hour(s))  CBC     Status: Abnormal   Collection Time   07/19/11  3:49 PM      Component Value Range   WBC 7.8  4.0 - 10.5 K/uL   RBC 2.15 (*) 3.87 - 5.11 MIL/uL   Hemoglobin 6.3 (*) 12.0 - 15.0 g/dL   HCT 18.1 (*) 36.0 - 46.0 %   MCV 84.2  78.0 - 100.0 fL   MCH 29.3  26.0 - 34.0 pg   MCHC 34.8  30.0 - 36.0 g/dL   RDW 13.6  11.5 - 15.5 %   Platelets 176  150 - 400 K/uL  DIFFERENTIAL     Status: Abnormal   Collection Time   07/19/11  3:49 PM      Component Value Range   Neutrophils Relative 72  43 - 77 %   Neutro Abs 5.6  1.7 - 7.7 K/uL   Lymphocytes Relative 15  12 - 46 %   Lymphs Abs 1.2  0.7 - 4.0 K/uL   Monocytes Relative 5  3 - 12 %   Monocytes Absolute 0.4  0.1 - 1.0 K/uL   Eosinophils Relative 8 (*) 0 - 5 %   Eosinophils Absolute 0.6  0.0 - 0.7 K/uL   Basophils Relative 0  0 - 1 %   Basophils Absolute 0.0  0.0 - 0.1 K/uL  BASIC METABOLIC PANEL     Status: Abnormal   Collection Time   07/19/11  3:49 PM      Component Value Range   Sodium 135  135 - 145 mEq/L   Potassium 5.1  3.5 - 5.1 mEq/L   Chloride 103  96 - 112 mEq/L   CO2 24  19 - 32 mEq/L   Glucose, Bld 189 (*) 70 - 99 mg/dL   BUN 60 (*) 6 - 23 mg/dL   Creatinine, Ser 1.95 (*) 0.50 - 1.10 mg/dL   Calcium 8.5  8.4 - 10.5 mg/dL   GFR calc non Af Amer 29 (*) >90 mL/min   GFR calc Af Amer 34 (*) >90 mL/min  TYPE AND  SCREEN     Status: Normal (Preliminary result)   Collection Time   07/19/11  3:57 PM      Component Value Range   ABO/RH(D) O POS     Antibody Screen POS     Sample Expiration 07/22/2011     DAT, IgG NEG     Antibody Identification ANTI-E     Unit Number [id number]     Blood Component Type RED CELLS,LR     Unit division 00     Status of Unit ISSUED     Donor AG Type NEGATIVE FOR E ANTIGEN NEGATIVE FOR KELL ANTIGEN     Transfusion Status OK TO TRANSFUSE     Crossmatch Result COMPATIBLE    PREPARE RBC (CROSSMATCH)     Status: Normal   Collection Time   07/19/11  3:57 PM      Component Value Range   Order Confirmation ORDER PROCESSED BY BLOOD BANK    URINALYSIS, ROUTINE W REFLEX MICROSCOPIC     Status: Abnormal   Collection Time   07/19/11  4:54 PM      Component Value Range   Color, Urine AMBER (*) YELLOW   APPearance CLOUDY (*) CLEAR   Specific Gravity, Urine 1.017  1.005 - 1.030   pH 6.5  5.0 - 8.0   Glucose, UA 100 (*) NEGATIVE mg/dL   Hgb urine dipstick LARGE (*) NEGATIVE   Bilirubin Urine NEGATIVE  NEGATIVE   Ketones, ur NEGATIVE  NEGATIVE mg/dL   Protein, ur >300 (*) NEGATIVE mg/dL   Urobilinogen, UA 0.2  0.0 - 1.0 mg/dL   Nitrite NEGATIVE  NEGATIVE   Leukocytes, UA LARGE (*) NEGATIVE  URINE MICROSCOPIC-ADD ON     Status: Abnormal   Collection Time   07/19/11  4:54 PM      Component Value Range   Squamous Epithelial / LPF FEW (*) RARE   WBC, UA 21-50  <3 WBC/hpf   RBC / HPF TOO NUMEROUS TO COUNT  <3 RBC/hpf  PREGNANCY, URINE     Status: Normal   Collection Time   07/19/11  5:30 PM      Component Value Range   Preg Test, Ur NEGATIVE  NEGATIVE   Assessment/Plan: 46 yo, non smoker, multiple medical problems with menorrhagia and anemia.  D/D- perimenopausal bleeding from hormonal changes/ anatomical problems incl fibroids/ hyperplasia/ cancer/ adenomyosis.  Transfuse total 3 units pRBCs overnight and stabilize pt, keep NPO pending evaluation in AM in case D&C needed.    Plan: Outpatient Pelvic sono and endometrial biopsy if bleeding better o/w plan pelvic sono and D&C while in hospital.  Will notify PCP in AM of her admission.  Patient counseled, understands and agrees to follow with plan.    Caniyah Murley R 07/19/2011, 10:32 PM

## 2011-07-20 ENCOUNTER — Inpatient Hospital Stay (HOSPITAL_COMMUNITY): Payer: BC Managed Care – PPO

## 2011-07-20 LAB — CBC
MCH: 28.1 pg (ref 26.0–34.0)
MCHC: 34.3 g/dL (ref 30.0–36.0)
MCV: 81.9 fL (ref 78.0–100.0)
Platelets: 141 10*3/uL — ABNORMAL LOW (ref 150–400)

## 2011-07-20 LAB — TYPE AND SCREEN
ABO/RH(D): O POS
Unit division: 0

## 2011-07-20 LAB — OCCULT BLOOD X 1 CARD TO LAB, STOOL: Fecal Occult Bld: POSITIVE

## 2011-07-20 MED ORDER — NORETHINDRONE ACETATE 5 MG PO TABS: 5.0000 mg | ORAL_TABLET | Freq: Two times a day (BID) | ORAL | Status: DC

## 2011-07-20 NOTE — Progress Notes (Signed)
Patient had a formed stool that was floating in bright red blood.Urine pan has only a haze of blood noted.Patient has changed her pads twice once pad had only a small amt of blood on it 2nd pad had nothing on it.Patient was asked did she have a stool that felt like diarrhea and she said no.? Large amt of blood in stool or vaginal?

## 2011-07-20 NOTE — Progress Notes (Signed)
Pt ambulated out  Teaching complete

## 2011-07-20 NOTE — ED Provider Notes (Signed)
Medical screening examination/treatment/procedure(s) were conducted as a shared visit with non-physician practitioner(s) and myself.  I personally evaluated the patient during the encounter  Pt with heavy vag bleeding, anemic, orthostatic hypotension, stabilized in ED, blood products ordered and transferred to [facility] Performed by: Sharyon Cable   Total critical care time: 40  Critical care time was exclusive of separately billable procedures and treating other patients.  Critical care was necessary to treat or prevent imminent or life-threatening deterioration.  Critical care was time spent personally by me on the following activities: development of treatment plan with patient and/or surrogate as well as nursing, discussions with consultants, evaluation of patient's response to treatment, examination of patient, obtaining history from patient or surrogate, ordering and performing treatments and interventions, ordering and review of laboratory studies, ordering and review of radiographic studies, pulse oximetry and re-evaluation of patient's condition.   Sharyon Cable, MD 07/20/11 [phone]

## 2011-07-20 NOTE — Progress Notes (Signed)
Ur chart review completed.  

## 2011-07-20 NOTE — Progress Notes (Signed)
Patient ID: Holly [name], female   DOB: February 26, 1965, 46 y.o.   MRN: [id number] Subjective: Dizziness better, s/p 3 units pRBCs. No CP/SOB. Bleeding improved a lot, but not entirely stopped. No pelvic pain. Feels a lot better and agrees to get discharged with follow-up in 1 wk in office.   Objective: Vital signs in last 24 hours: Temp:  [97.8 F (36.6 C)-98.7 F (37.1 C)] 98.4 F (36.9 C) (06/24 1000) Pulse Rate:  [70-79] 78  (06/24 1000) Resp:  [8-20] 18  (06/24 1000) BP: (81-185)/(50-106) 181/90 mmHg (06/24 1000) SpO2:  [96 %-100 %] 96 % (06/24 1000) Weight:  [77.111 kg (170 lb)] 77.111 kg (170 lb) (06/23 2126) Weight change:  Last BM Date: 07/20/11  Intake/Output from previous day: 06/23 0701 - 06/24 0700 In: 2400.8 [P.O.:220; I.V.:1268.3; Blood:912.5] Out: 600 [Urine:600] I  Physical exam:  A&O x 3, no acute distress. Pleasant HEENT neg, no LAD Lungs CTA bilat CV RRR, S1S2 normal Abdo soft, non tender, non acute Extr no edema/ tenderness Pelvic Uterus AV, 8-10 wks, mobile, smooth. Cervix appears normal, no lesions. Some bleeding from os but no active bleeding or pooling of blood.    Lab Results:  [facility] 07/20/11 1054 07/19/11 1549  WBC 10.5 7.8  HGB 9.6* 6.3*  HCT 28.0* 18.1*  PLT 141* 176   BMET  Basename 07/19/11 1549 07/19/11 1511  NA 135 138  K 5.1 4.8  CL 103 104  CO2 24 --  GLUCOSE 189* 180*  BUN 60* 66*  CREATININE 1.95* 1.90*  CALCIUM 8.5 --    Studies/Results: US Transvaginal Non-ob  07/20/2011  *RADIOLOGY REPORT*  Clinical Data: Vaginal bleeding.  Anemia. LMP 07/17/2011.  TRANSABDOMINAL AND TRANSVAGINAL ULTRASOUND OF PELVIS  Technique:  Both transabdominal and transvaginal ultrasound examinations of the pelvis were performed.  Transabdominal technique was performed for global imaging of the pelvis including uterus, ovaries, adnexal regions, and pelvic cul-de-sac.  It was necessary to proceed with endovaginal exam following the transabdominal exam  to visualize the endometrium and cystic lesion in left adnexa.  Comparison:  CT on 06/06/2008  Findings: Uterus:  10.9 x 5.8 x 7.3 cm.  Heterogeneous echogenicity of the uterine myometrium noted.  Prior C-section scar noted in lower uterine segment.  No distinct fibroids identified.  Endometrium: A focal fluid collection is visualized in the fundal portion of the endometrial cavity.  Double layer endometrial thickness measures 10 mm transvaginally, and with a possible focal polypoid lesion in the lower uterine segment.  Note that the patient is having active vaginal bleeding at time of this exam.  Right ovary: Normal appearance/no adnexal mass  Left ovary: Not well visualized.  A tubular appearing cystic lesion is seen in the left adnexa which measures 2.1 x 4.9 cm.  This is suspicious for hydrosalpinx although a complex cyst of the left ovary cannot be excluded as the ovary is not visualized separate from this lesion.  Other Findings:  A small amount of free fluid in the cul-de-sac appear  IMPRESSION:  1.  Abnormal appearance of endometrium with focal fluid collection in the fundal region and possible polypoid lesion in the lower uterine segment.  Note this patient is having active vaginal bleeding at the time of this exam, which may explain these findings. Suggest follow-up transvaginal pelvic ultrasound in 6-12 weeks, during the first week following menses.  Sonohysterogram should also be considered if the bleeding remains unresponsive to hormonal or medical therapy.  2. Indeterminate but probably benign cystic lesion in  the left adnexa, suspicious for hydrosalpinx although a cystic ovarian lesion cannot be excluded. This should also be reevaluated by ultrasound in 6-12 weeks, during the 1st week immediately following menses.  This recommendation follows the consensus statement:  Management of Asymptomatic Ovarian and Other Adnexal Cysts Imaged at [place]:  Society of Radiologists in [place]. Radiology 2010; [phone].  Original Report Authenticated By: Marlaine Hind, M.D.   US Pelvis Complete  07/20/2011  *RADIOLOGY REPORT*  Clinical Data: Vaginal bleeding.  Anemia. LMP 07/17/2011.  TRANSABDOMINAL AND TRANSVAGINAL ULTRASOUND OF PELVIS  Technique:  Both transabdominal and transvaginal ultrasound examinations of the pelvis were performed.  Transabdominal technique was performed for global imaging of the pelvis including uterus, ovaries, adnexal regions, and pelvic cul-de-sac.  It was necessary to proceed with endovaginal exam following the transabdominal exam to visualize the endometrium and cystic lesion in left adnexa.  Comparison:  CT on 06/06/2008  Findings: Uterus:  10.9 x 5.8 x 7.3 cm.  Heterogeneous echogenicity of the uterine myometrium noted.  Prior C-section scar noted in lower uterine segment.  No distinct fibroids identified.  Endometrium: A focal fluid collection is visualized in the fundal portion of the endometrial cavity.  Double layer endometrial thickness measures 10 mm transvaginally, and with a possible focal polypoid lesion in the lower uterine segment.  Note that the patient is having active vaginal bleeding at time of this exam.  Right ovary: Normal appearance/no adnexal mass  Left ovary: Not well visualized.  A tubular appearing cystic lesion is seen in the left adnexa which measures 2.1 x 4.9 cm.  This is suspicious for hydrosalpinx although a complex cyst of the left ovary cannot be excluded as the ovary is not visualized separate from this lesion.  Other Findings:  A small amount of free fluid in the cul-de-sac appear  IMPRESSION:  1.  Abnormal appearance of endometrium with focal fluid collection in the fundal region and possible polypoid lesion in the lower uterine segment.  Note this patient is having active vaginal bleeding at the time of this exam, which may explain these findings. Suggest follow-up transvaginal pelvic ultrasound in 6-12 weeks, during the  first week following menses.  Sonohysterogram should also be considered if the bleeding remains unresponsive to hormonal or medical therapy.  2. Indeterminate but probably benign cystic lesion in the left adnexa, suspicious for hydrosalpinx although a cystic ovarian lesion cannot be excluded. This should also be reevaluated by ultrasound in 6-12 weeks, during the 1st week immediately following menses.  This recommendation follows the consensus statement:  Management of Asymptomatic Ovarian and Other Adnexal Cysts Imaged at [place]:  Society of Radiologists in [place]. Radiology 2010; [phone].  Original Report Authenticated By: Marlaine Hind, M.D.     Assessment/Plan: Perimenopausal menorrhagia, symptomatic anemia, improved with blood transfusion, H.H at 9.6. VS stable and dizziness resolved. NO active bleeding. Sono -no fibroids, but possible small polyp and some blood, ET 7mm.  Plan D/c home. F/up in office in 1 wk, will also do Pap, office Endometrial biopsy. Compliance reviewed including need for pathologic evaluation to r/o uterus cancer/ hyperplasia. Patient voiced understands and agrees to follow up.  D/c home with Norethindrone 5 mg bid until bleeding stops and then 1 daily. Continue Iron, continue all other medications.    Tori Cupps R 07/20/2011, 11:20 AM

## 2011-07-20 NOTE — Discharge Instructions (Signed)
Abnormal Uterine Bleeding Abnormal uterine bleeding can have many causes. Some cases are simply treated, while others are more serious. There are several kinds of bleeding that is considered abnormal, including:  Bleeding between periods.   Bleeding after sexual intercourse.   Spotting anytime in the menstrual cycle.   Bleeding heavier or more than normal.   Bleeding after menopause.  CAUSES  There are many causes of abnormal uterine bleeding. It can be present in teenagers, pregnant women, women during their reproductive years, and women who have reached menopause. Your caregiver will look for the more common causes depending on your age, signs, symptoms and your particular circumstance. Most cases are not serious and can be treated. Even the more serious causes, like cancer of the female organs, can be treated adequately if found in the early stages. That is why all types of bleeding should be evaluated and treated as soon as possible. DIAGNOSIS  Diagnosing the cause may take several kinds of tests. Your caregiver may:  Take a complete history of the type of bleeding.   Perform a complete physical exam and Pap smear.   Take an ultrasound on the abdomen showing a picture of the female organs and the pelvis.   Inject dye into the uterus and Fallopian tubes and X-ray them (hysterosalpingogram).   Place fluid in the uterus and do an ultrasound (sonohysterogrqphy).   Take a CT scan to examine the female organs and pelvis.   Take an MRI to examine the female organs and pelvis. There is no X-ray involved with this procedure.   Look inside the uterus with a telescope that has a light at the end (hysteroscopy).   Scrap the inside of the uterus to get tissue to examine (Dilatation and Curettage, D&C).   Look into the pelvis with a telescope that has a light at the end (laparoscopy). This is done through a very small cut (incision) in the abdomen.  TREATMENT  Treatment will depend on the  cause of the abnormal bleeding. It can include:  Doing nothing to allow the problem to take care of itself over time.   Hormone treatment.   Birth control pills.   Treating the medical condition causing the problem.   Laparoscopy.   Major or minor surgery   Destroying the lining of the uterus with electrical currant, laser, freezing or heat (uterine ablation).  HOME CARE INSTRUCTIONS   Follow your caregiver's recommendation on how to treat your problem.   See your caregiver if you missed a menstrual period and think you may be pregnant.   If you are bleeding heavily, count the number of pads/tampons you use and how often you have to change them. Tell this to your caregiver.   Avoid sexual intercourse until the problem is controlled.  SEEK MEDICAL CARE IF:   You have any kind of abnormal bleeding mentioned above.   You feel dizzy at times.   You are 46 years old and have not had a menstrual period yet.  SEEK IMMEDIATE MEDICAL CARE IF:   You pass out.   You are changing pads/tampons every 15 to 30 minutes.   You have belly (abdominal) pain.   You have a temperature of 100 F (37.8 C) or higher.   You become sweaty or weak.   You are passing large blood clots from the vagina.   You start to feel sick to your stomach (nauseous) and throw up (vomit).  Document Released: 01/12/2005 Document Revised: 01/01/2011 Document Reviewed: 06/07/2008 [employer]   Patient Information 2012 [employer]. 

## 2011-07-20 NOTE — Discharge Summary (Signed)
Physician Discharge Summary  Patient ID: Holly [name] MRN: [id number] DOB/AGE: 03-Dec-1965 46 y.o.  Admit date: 07/19/2011 Discharge date: 07/20/2011  Admission Diagnoses:  Menorrhagia, symptomatic anemia. Discharge Diagnoses: Same, improved, s/p 3 units packed RBC transfusion. Discharged Condition: Improved. Hospital Course: 46 yo, perimenopausal woman, transferred from [facility] ED, admitted to [facility] service for symptomatic anemia with fatigue, dizziness and was orthostatic in ED.  No menses for 8 months, normal period last month but very heavy with clots starting 07/17/11. VS signs stable (with elevated but stable BP) and after blood transfusion (3 units), Hgb improved to 9.6 from 6.1 in ED.  Pelvic ultrasound noted slightly enlarged uterus with no specific fibroids, a possible endometrial polyp and small ovarian cysts, likely physiologic.   Discharge Exam: Blood pressure 181/90, pulse 78, temperature 98.4 F (36.9 C), temperature source Oral, resp. rate 18, height 5\' 7"  (1.702 m), weight 77.111 kg (170 lb), last menstrual period 07/17/2011, SpO2 96.00%. Improved bleeding, stable exam.   Disposition: Home with family  Discharge Orders    Future Orders Please Complete By Expires   Diet - low sodium heart healthy      Increase activity slowly      Call MD for:  temperature >100.4      Call MD for:  persistant nausea and vomiting      Call MD for:  extreme fatigue      Call MD for:  persistant dizziness or light-headedness      Call MD for:      Comments:   Heavy vaginal bleeding changing a pad every 2-3 hours     Medication List  As of 07/20/2011 11:34 AM   TAKE these medications         amLODipine 5 MG tablet   Commonly known as: NORVASC   Take 5 mg by mouth daily.      benazepril 40 MG tablet   Commonly known as: LOTENSIN   Take 40 mg by mouth daily.      carvedilol 25 MG tablet   Commonly known as: COREG   Take 25 mg by mouth 2 (two) times daily with a meal.     FLUoxetine 20 MG capsule   Commonly known as: PROZAC   Take 20 mg by mouth every morning.      furosemide 20 MG tablet   Commonly known as: LASIX   Take 40 mg by mouth daily.      insulin detemir 100 UNIT/ML injection   Commonly known as: LEVEMIR   Inject 10 Units into the skin 2 (two) times daily.      metFORMIN 1000 MG tablet   Commonly known as: GLUCOPHAGE   Take 1,000 mg by mouth 2 (two) times daily with a meal.      norethindrone 5 MG tablet   Commonly known as: AYGESTIN   Take 1 tablet (5 mg total) by mouth 2 (two) times daily.      PERFECT IRON PO   Take 1 tablet by mouth daily.           Follow-up Information    Follow up with Wasil Wolke R, MD. Call in 1 week.   Contact information:   [place] [phone]        Patient advised to see PCP if BP or DM not well controlled. She also has appointment to see Urologist at [facility] in 1 wk and Nephrologist in [place].  Signed: Orlinda Slomski R 07/20/2011, 11:34 AM

## 2011-07-21 LAB — URINE CULTURE

## 2011-07-21 LAB — TYPE AND SCREEN
ABO/RH(D): O POS
DAT, IgG: NEGATIVE
Donor AG Type: NEGATIVE
PT AG Type: NEGATIVE
Unit division: 0
Unit division: 0

## 2011-12-21 ENCOUNTER — Ambulatory Visit: Payer: BC Managed Care – PPO | Admitting: Internal Medicine

## 2011-12-22 ENCOUNTER — Ambulatory Visit: Payer: BC Managed Care – PPO | Admitting: Internal Medicine

## 2012-01-07 ENCOUNTER — Other Ambulatory Visit: Payer: Self-pay | Admitting: *Deleted

## 2012-01-07 MED ORDER — BENAZEPRIL HCL 40 MG PO TABS: 40.0000 mg | ORAL_TABLET | Freq: Every day | ORAL | Status: DC

## 2012-05-03 ENCOUNTER — Other Ambulatory Visit: Payer: Self-pay | Admitting: Family Medicine

## 2012-05-03 DIAGNOSIS — R319 Hematuria, unspecified: Secondary | ICD-10-CM

## 2012-05-03 DIAGNOSIS — R109 Unspecified abdominal pain: Secondary | ICD-10-CM

## 2012-05-09 ENCOUNTER — Ambulatory Visit
Admission: RE | Admit: 2012-05-09 | Discharge: 2012-05-09 | Disposition: A | Discharge status: Home / Self Care | Payer: BC Managed Care – PPO | Source: Ambulatory Visit | Attending: Family Medicine | Admitting: Family Medicine

## 2012-05-09 DIAGNOSIS — R109 Unspecified abdominal pain: Secondary | ICD-10-CM

## 2012-05-09 DIAGNOSIS — R319 Hematuria, unspecified: Secondary | ICD-10-CM

## 2012-05-11 ENCOUNTER — Other Ambulatory Visit: Payer: Self-pay | Admitting: Family Medicine

## 2012-05-11 DIAGNOSIS — N6320 Unspecified lump in the left breast, unspecified quadrant: Secondary | ICD-10-CM

## 2012-05-18 ENCOUNTER — Ambulatory Visit
Admission: RE | Admit: 2012-05-18 | Discharge: 2012-05-18 | Disposition: A | Discharge status: Home / Self Care | Payer: BC Managed Care – PPO | Source: Ambulatory Visit | Attending: Family Medicine | Admitting: Family Medicine

## 2012-05-18 DIAGNOSIS — N6320 Unspecified lump in the left breast, unspecified quadrant: Secondary | ICD-10-CM

## 2012-05-19 ENCOUNTER — Telehealth: Payer: Self-pay | Admitting: Oncology

## 2012-05-19 ENCOUNTER — Ambulatory Visit: Payer: BC Managed Care – PPO | Admitting: Endocrinology

## 2012-05-19 NOTE — Telephone Encounter (Signed)
Called pt was not available will try back.

## 2012-05-23 ENCOUNTER — Telehealth: Payer: Self-pay | Admitting: Oncology

## 2012-05-23 NOTE — Telephone Encounter (Signed)
Called pt not available will try back

## 2012-05-25 ENCOUNTER — Encounter: Payer: Self-pay | Admitting: Endocrinology

## 2012-05-25 ENCOUNTER — Ambulatory Visit (INDEPENDENT_AMBULATORY_CARE_PROVIDER_SITE_OTHER): Payer: BC Managed Care – PPO | Admitting: Endocrinology

## 2012-05-25 VITALS — BP 122/70 | HR 64 | Wt 167.0 lb

## 2012-05-25 DIAGNOSIS — IMO0001 Reserved for inherently not codable concepts without codable children: Secondary | ICD-10-CM

## 2012-05-25 MED ORDER — GLUCOSE BLOOD VI STRP: 1.0000 | ORAL_STRIP | Freq: Two times a day (BID) | Status: DC

## 2012-05-25 MED ORDER — NATEGLINIDE 120 MG PO TABS: 120.0000 mg | ORAL_TABLET | Freq: Three times a day (TID) | ORAL | Status: DC

## 2012-05-25 NOTE — Progress Notes (Signed)
Subjective:    Patient ID: Holly [name], female    DOB: 11/04/1965, 47 y.o.   MRN: [id number]  HPI pt states 11 years h/o dm; complicated by proliferative retinopathy, peripheral sensory neuropathy, nephropathy, and autonomic neuropathy.  he has been on insulin since dx.  pt says her diet is good, but exercise is limited by health probs. She reports of severe numbness of the feet, but no assoc pain She does not check cbg's. Pt says a1c was 5 a few mos ago, but metformin was stopped due to renal insufficiency. Past Medical History  Diagnosis Date  . DM2 (diabetes mellitus, type 2)   . Hypercholesteremia   . Chest pain   . Bulimia   . ADD (attention deficit disorder)   . Acute cystitis   . Anemia, unspecified   . Anxiety state, unspecified   . Essential hypertension, benign   . Chronic ulcer of unspecified site   . Chronic inflammatory demyelinating polyneuritis   . Dysthymic disorder   . Peripheral autonomic neuropathy in disorders classified elsewhere   . Type II or unspecified type diabetes mellitus with other specified manifestations, not stated as uncontrolled     Cardiac catheterization 7/10 at [facility] cardiology in [facility]: Normal coronary arteries  . Background diabetic retinopathy(362.01)   . Diarrhea     symptomatic  . Edema     moderate  . Esophageal reflux   . HLD (hyperlipidemia)     HDL goal >50, LDL goal <100  . Unspecified essential hypertension     Renal Dopplers 12/23/09 at [facility] cardiology in [facility]: No significant renal artery stenosis bilaterally  . Disorders of magnesium metabolism   . Insomnia, unspecified   . Irritable bowel syndrome   . Left heart failure   . Muscle weakness (generalized)   . Calculus of kidney   . Dysuria   . Palpitations   . Primary pulmonary hypertension     not seen at [facility]  (2mm Hg)  . Neuralgia, neuritis, and radiculitis, unspecified   . Restless legs syndrome (RLS)   . Encounter for long-term (current) use of  other medications     Past Surgical History  Procedure Laterality Date  . Tonsillectomy and adenoidectomy    . Cesarean section      History   Social History  . Marital Status: Single    Spouse Name: N/A    Number of Children: 4  . Years of Education: N/A   Occupational History  . collections    Social History Main Topics  . Smoking status: Never Smoker   . Smokeless tobacco: Never Used  . Alcohol Use: No  . Drug Use: No  . Sexually Active: Not on file   Other Topics Concern  . Not on file   Social History Narrative  . No narrative on file    Current Outpatient Prescriptions on File Prior to Visit  Medication Sig Dispense Refill  . amLODipine (NORVASC) 5 MG tablet Take 5 mg by mouth daily.      . benazepril (LOTENSIN) 40 MG tablet Take 1 tablet (40 mg total) by mouth daily.  30 tablet  12  . Carbonyl Iron (PERFECT IRON PO) Take 1 tablet by mouth daily.      . carvedilol (COREG) 25 MG tablet Take 25 mg by mouth 2 (two) times daily with a meal.      . FLUoxetine (PROZAC) 20 MG capsule Take 20 mg by mouth every morning.       Marland Kitchen  furosemide (LASIX) 20 MG tablet Take 40 mg by mouth daily.      . insulin detemir (LEVEMIR) 100 UNIT/ML injection Inject 10 Units into the skin 2 (two) times daily.       . norethindrone (AYGESTIN) 5 MG tablet Take 1 tablet (5 mg total) by mouth 2 (two) times daily.  30 tablet  1   Current Facility-Administered Medications on File Prior to Visit  Medication Dose Route Frequency Provider Last Rate Last Dose  . cloNIDine (CATAPRES) tablet 0.1 mg  0.1 mg Oral Once Deboraha Sprang, MD        Allergies  Allergen Reactions  . Bactrim Nausea And Vomiting  . Tekturna (Aliskiren Fumarate) Rash    Family History  Problem Relation Age of Onset  . Hypertension Mother   . Myelodysplastic syndrome Father   . Pancreatic cancer      BP 122/70  Pulse 64  Wt 167 lb (75.751 kg)  BMI 26.15 kg/m2  SpO2 98%   Review of Systems denies blurry vision,  headache, chest pain, sob, n/v, urinary frequency, cramps, excessive diaphoresis, memory loss, rhinorrhea, and easy bruising.  She has lost weight.  She has depression.  She has no menses due to norethindrone.    Objective:   Physical Exam VS: see vs page GEN: no distress HEAD: head: no deformity eyes: no periorbital swelling, no proptosis external nose and ears are normal mouth: no lesion seen NECK: supple, thyroid is not enlarged CHEST WALL: no deformity LUNGS:  Clear to auscultation CV: reg rate and rhythm, no murmur ABD: abdomen is soft, nontender.  no hepatosplenomegaly.  not distended.  no hernia MUSCULOSKELETAL: muscle bulk and strength are grossly normal.  no obvious joint swelling.  gait is normal and steady.   PULSES: no carotid bruit.   NEURO:  cn 2-12 grossly intact.   readily moves all 4's.  SKIN:  Normal texture and temperature.  No rash or suspicious lesion is visible.   NODES:  None palpable at the neck PSYCH: alert, oriented x3.  Does not appear anxious nor depressed.  (pt says a1c was 7.5, 1 month ago)    Assessment & Plan:  DM: therapy limited by noncompliance with cbg checking.  i'll do the best i can.  pt says she can be managed with orals, so i'll see if that can be done.  If so, we'll need to do so in stages, for her safety. Renal insufficiency.  In view of this, she can't take metformin. Neuropathy, prob due to DM Depression.  This complicates the rx of DM

## 2012-05-25 NOTE — Patient Instructions (Addendum)
good diet and exercise habits significanly improve the control of your diabetes.  please let me know if you wish to be referred to a dietician.  high blood sugar is very risky to your health.  you should see an eye doctor every year.  You are at higher than average risk for pneumonia and hepatitis-B.  You should be vaccinated against both.   controlling your blood pressure and cholesterol drastically reduces the damage diabetes does to your body.  this also applies to quitting smoking.  please discuss these with your doctor.  you should take an aspirin every day, unless you have been advised by a doctor not to.   check your blood sugar 2 times a day.  vary the time of day when you check, between before the 3 meals, and at bedtime.  also check if you have symptoms of your blood sugar being too high or too low.  please keep a record of the readings and bring it to your next appointment here.  please call us sooner if your blood sugar goes below 70, or if you have a lot of readings over 200.   Please continue the tradjenta.  Please add "nateglinide."  i have sent a prescription to your pharmacy. Reduce the levemir to 10 units twice a day.  Please come back for a follow-up appointment in 2 weeks.

## 2012-05-26 ENCOUNTER — Telehealth: Payer: Self-pay | Admitting: Oncology

## 2012-05-26 NOTE — Telephone Encounter (Signed)
LVOM SON TO RETURN CALL IN RE TO NP APPT FOR MOTHER.

## 2012-06-08 ENCOUNTER — Ambulatory Visit: Payer: BC Managed Care – PPO | Admitting: Endocrinology

## 2012-06-08 DIAGNOSIS — Z0289 Encounter for other administrative examinations: Secondary | ICD-10-CM

## 2013-01-31 ENCOUNTER — Ambulatory Visit: Payer: BC Managed Care – PPO | Admitting: Podiatry

## 2013-04-27 ENCOUNTER — Ambulatory Visit: Payer: BC Managed Care – PPO | Admitting: Internal Medicine

## 2013-05-02 ENCOUNTER — Encounter: Payer: Self-pay | Admitting: Internal Medicine

## 2013-06-29 ENCOUNTER — Other Ambulatory Visit (HOSPITAL_COMMUNITY): Payer: Self-pay | Admitting: *Deleted

## 2013-06-30 ENCOUNTER — Inpatient Hospital Stay (HOSPITAL_COMMUNITY): Admission: RE | Admit: 2013-06-30 | Payer: BC Managed Care – PPO | Source: Ambulatory Visit

## 2013-07-03 ENCOUNTER — Encounter (HOSPITAL_COMMUNITY)
Admission: RE | Admit: 2013-07-03 | Discharge: 2013-07-03 | Disposition: A | Discharge status: Home / Self Care | Payer: Commercial Managed Care - PPO | Source: Ambulatory Visit | Attending: Nephrology | Admitting: Nephrology

## 2013-07-03 DIAGNOSIS — N184 Chronic kidney disease, stage 4 (severe): Secondary | ICD-10-CM | POA: Insufficient documentation

## 2013-07-03 DIAGNOSIS — D638 Anemia in other chronic diseases classified elsewhere: Secondary | ICD-10-CM | POA: Diagnosis present

## 2013-07-03 LAB — POCT HEMOGLOBIN-HEMACUE: Hemoglobin: 9.2 g/dL — ABNORMAL LOW (ref 12.0–15.0)

## 2013-07-03 MED ORDER — EPOETIN ALFA 40000 UNIT/ML IJ SOLN: 30000.0000 [IU] | INTRAMUSCULAR | Status: DC

## 2013-07-03 MED ORDER — EPOETIN ALFA 10000 UNIT/ML IJ SOLN
INTRAMUSCULAR | Status: AC
  Administered 2013-07-03: 10000 [IU] via SUBCUTANEOUS
  Filled 2013-07-03: qty 1

## 2013-07-03 MED ORDER — EPOETIN ALFA 20000 UNIT/ML IJ SOLN
INTRAMUSCULAR | Status: AC
  Administered 2013-07-03: 20000 [IU] via SUBCUTANEOUS
  Filled 2013-07-03: qty 1

## 2013-07-03 NOTE — Discharge Instructions (Signed)
Epoetin Alfa injection What is this medicine? EPOETIN ALFA (e POE e tin AL fa) helps your body make more red blood cells. This medicine is used to treat anemia caused by chronic kidney failure, cancer chemotherapy, or HIV-therapy. It may also be used before surgery if you have anemia. This medicine may be used for other purposes; ask your health care provider or pharmacist if you have questions. COMMON BRAND NAME(S): Epogen, Procrit What should I tell my health care provider before I take this medicine? They need to know if you have any of these conditions: -blood clotting disorders -cancer patient not on chemotherapy -cystic fibrosis -heart disease, such as angina or heart failure -hemoglobin level of 12 g/dL or greater -high blood pressure -low levels of folate, iron, or vitamin B12 -seizures -an unusual or allergic reaction to erythropoietin, albumin, benzyl alcohol, hamster proteins, other medicines, foods, dyes, or preservatives -pregnant or trying to get pregnant -breast-feeding How should I use this medicine? This medicine is for injection into a vein or under the skin. It is usually given by a health care professional in a hospital or clinic setting. If you get this medicine at home, you will be taught how to prepare and give this medicine. Use exactly as directed. Take your medicine at regular intervals. Do not take your medicine more often than directed. It is important that you put your used needles and syringes in a special sharps container. Do not put them in a trash can. If you do not have a sharps container, call your pharmacist or healthcare provider to get one. Talk to your pediatrician regarding the use of this medicine in children. While this drug may be prescribed for selected conditions, precautions do apply. Overdosage: If you think you have taken too much of this medicine contact a poison control center or emergency room at once. NOTE: This medicine is only for you. Do  not share this medicine with others. What if I miss a dose? If you miss a dose, take it as soon as you can. If it is almost time for your next dose, take only that dose. Do not take double or extra doses. What may interact with this medicine? Do not take this medicine with any of the following medications: -darbepoetin alfa This list may not describe all possible interactions. Give your health care provider a list of all the medicines, herbs, non-prescription drugs, or dietary supplements you use. Also tell them if you smoke, drink alcohol, or use illegal drugs. Some items may interact with your medicine. What should I watch for while using this medicine? Visit your prescriber or health care professional for regular checks on your progress and for the needed blood tests and blood pressure measurements. It is especially important for the doctor to make sure your hemoglobin level is in the desired range, to limit the risk of potential side effects and to give you the best benefit. Keep all appointments for any recommended tests. Check your blood pressure as directed. Ask your doctor what your blood pressure should be and when you should contact him or her. As your body makes more red blood cells, you may need to take iron, folic acid, or vitamin B supplements. Ask your doctor or health care provider which products are right for you. If you have kidney disease continue dietary restrictions, even though this medication can make you feel better. Talk with your doctor or health care professional about the foods you eat and the vitamins that you take. What   side effects may I notice from receiving this medicine? Side effects that you should report to your doctor or health care professional as soon as possible: -allergic reactions like skin rash, itching or hives, swelling of the face, lips, or tongue -breathing problems -changes in vision -chest pain -confusion, trouble speaking or understanding -feeling  faint or lightheaded, falls -high blood pressure -muscle aches or pains -pain, swelling, warmth in the leg -rapid weight gain -severe headaches -sudden numbness or weakness of the face, arm or leg -trouble walking, dizziness, loss of balance or coordination -seizures (convulsions) -swelling of the ankles, feet, hands -unusually weak or tired Side effects that usually do not require medical attention (report to your doctor or health care professional if they continue or are bothersome): -diarrhea -fever, chills (flu-like symptoms) -headaches -nausea, vomiting -redness, stinging, or swelling at site where injected This list may not describe all possible side effects. Call your doctor for medical advice about side effects. You may report side effects to FDA at 1-800-FDA-1088. Where should I keep my medicine? Keep out of the reach of children. Store in a refrigerator between 2 and 8 degrees C (36 and 46 degrees F). Do not freeze or shake. Throw away any unused portion if using a single-dose vial. Multi-dose vials can be kept in the refrigerator for up to 21 days after the initial dose. Throw away unused medicine. NOTE: This sheet is a summary. It may not cover all possible information. If you have questions about this medicine, talk to your doctor, pharmacist, or health care provider.  2014, Elsevier/Gold Standard. (2007-12-27 10:25:44)  

## 2013-07-19 ENCOUNTER — Encounter: Payer: Self-pay | Admitting: Cardiology

## 2013-07-23 ENCOUNTER — Ambulatory Visit (INDEPENDENT_AMBULATORY_CARE_PROVIDER_SITE_OTHER): Payer: Commercial Managed Care - PPO | Admitting: Emergency Medicine

## 2013-07-23 VITALS — BP 130/82 | HR 87 | Temp 97.9°F | Resp 16 | Ht 66.0 in | Wt 163.0 lb

## 2013-07-23 DIAGNOSIS — Z0000 Encounter for general adult medical examination without abnormal findings: Secondary | ICD-10-CM

## 2013-07-23 LAB — COMPREHENSIVE METABOLIC PANEL
ALT: 11 U/L (ref 0–35)
AST: 12 U/L (ref 0–37)
Albumin: 3.9 g/dL (ref 3.5–5.2)
Alkaline Phosphatase: 56 U/L (ref 39–117)
BILIRUBIN TOTAL: 0.4 mg/dL (ref 0.2–1.2)
BUN: 56 mg/dL — AB (ref 6–23)
CALCIUM: 9.3 mg/dL (ref 8.4–10.5)
CHLORIDE: 104 meq/L (ref 96–112)
CO2: 24 meq/L (ref 19–32)
CREATININE: 2.89 mg/dL — AB (ref 0.50–1.10)
GLUCOSE: 178 mg/dL — AB (ref 70–99)
Potassium: 4.9 mEq/L (ref 3.5–5.3)
Sodium: 138 mEq/L (ref 135–145)
Total Protein: 6.8 g/dL (ref 6.0–8.3)

## 2013-07-23 LAB — CBC WITH DIFFERENTIAL/PLATELET
Basophils Absolute: 0.1 10*3/uL (ref 0.0–0.1)
Basophils Relative: 1 % (ref 0–1)
EOS ABS: 0.4 10*3/uL (ref 0.0–0.7)
EOS PCT: 6 % — AB (ref 0–5)
HEMATOCRIT: 34.7 % — AB (ref 36.0–46.0)
Hemoglobin: 11.2 g/dL — ABNORMAL LOW (ref 12.0–15.0)
LYMPHS ABS: 1.2 10*3/uL (ref 0.7–4.0)
LYMPHS PCT: 20 % (ref 12–46)
MCH: 28.4 pg (ref 26.0–34.0)
MCHC: 32.3 g/dL (ref 30.0–36.0)
MCV: 88.1 fL (ref 78.0–100.0)
MONO ABS: 0.4 10*3/uL (ref 0.1–1.0)
MONOS PCT: 7 % (ref 3–12)
Neutro Abs: 4.1 10*3/uL (ref 1.7–7.7)
Neutrophils Relative %: 66 % (ref 43–77)
PLATELETS: 249 10*3/uL (ref 150–400)
RBC: 3.94 MIL/uL (ref 3.87–5.11)
RDW: 14.2 % (ref 11.5–15.5)
WBC: 6.2 10*3/uL (ref 4.0–10.5)

## 2013-07-23 LAB — LIPID PANEL
CHOL/HDL RATIO: 7 ratio
CHOLESTEROL: 238 mg/dL — AB (ref 0–200)
HDL: 34 mg/dL — AB (ref >39)
LDL Cholesterol: 170 mg/dL — ABNORMAL HIGH (ref 0–99)
TRIGLYCERIDES: 169 mg/dL — AB (ref <150)
VLDL: 34 mg/dL (ref 0–40)

## 2013-07-23 LAB — TSH: TSH: 3.022 u[IU]/mL (ref 0.350–4.500)

## 2013-07-23 NOTE — Patient Instructions (Signed)
Type 2 Diabetes Mellitus, Adult Type 2 diabetes mellitus, often simply referred to as type 2 diabetes, is a long-lasting (chronic) disease. In type 2 diabetes, the pancreas does not make enough insulin (a hormone), the cells are less responsive to the insulin that is made (insulin resistance), or both. Normally, insulin moves sugars from food into the tissue cells. The tissue cells use the sugars for energy. The lack of insulin or the lack of normal response to insulin causes excess sugars to build up in the blood instead of going into the tissue cells. As a result, high blood sugar (hyperglycemia) develops. The effect of high sugar (glucose) levels can cause many complications. Type 2 diabetes was also previously called adult-onset diabetes but it can occur at any age.  RISK FACTORS  A person is predisposed to developing type 2 diabetes if someone in the family has the disease and also has one or more of the following primary risk factors:  Overweight.  An inactive lifestyle.  A history of consistently eating high-calorie foods. Maintaining a normal weight and regular physical activity can reduce the chance of developing type 2 diabetes. SYMPTOMS  A person with type 2 diabetes may not show symptoms initially. The symptoms of type 2 diabetes appear slowly. The symptoms include:  Increased thirst (polydipsia).  Increased urination (polyuria).  Increased urination during the night (nocturia).  Weight loss. This weight loss may be rapid.  Frequent, recurring infections.  Tiredness (fatigue).  Weakness.  Vision changes, such as blurred vision.  Fruity smell to your breath.  Abdominal pain.  Nausea or vomiting.  Cuts or bruises which are slow to heal.  Tingling or numbness in the hands or feet. DIAGNOSIS Type 2 diabetes is frequently not diagnosed until complications of diabetes are present. Type 2 diabetes is diagnosed when symptoms or complications are present and when blood  glucose levels are increased. Your blood glucose level may be checked by one or more of the following blood tests:  A fasting blood glucose test. You will not be allowed to eat for at least 8 hours before a blood sample is taken.  A random blood glucose test. Your blood glucose is checked at any time of the day regardless of when you ate.  A hemoglobin A1c blood glucose test. A hemoglobin A1c test provides information about blood glucose control over the previous 3 months.  An oral glucose tolerance test (OGTT). Your blood glucose is measured after you have not eaten (fasted) for 2 hours and then after you drink a glucose-containing beverage. TREATMENT   You may need to take insulin or diabetes medicine daily to keep blood glucose levels in the desired range.  If you use insulin, you may need to adjust the dosage depending on the carbohydrates that you eat with each meal or snack. The treatment goal is to maintain the before meal blood sugar (preprandial glucose) level at 70-130 mg/dL. HOME CARE INSTRUCTIONS   Have your hemoglobin A1c level checked twice a year.  Perform daily blood glucose monitoring as directed by your health care provider.  Monitor urine ketones when you are ill and as directed by your health care provider.  Take your diabetes medicine or insulin as directed by your health care provider to maintain your blood glucose levels in the desired range.  Never run out of diabetes medicine or insulin. It is needed every day.  If you are using insulin, you may need to adjust the amount of insulin given based on your intake   of carbohydrates. Carbohydrates can raise blood glucose levels but need to be included in your diet. Carbohydrates provide vitamins, minerals, and fiber which are an essential part of a healthy diet. Carbohydrates are found in fruits, vegetables, whole grains, dairy products, legumes, and foods containing added sugars.  Eat healthy foods. You should make an  appointment to see a registered dietitian to help you create an eating plan that is right for you.  Lose weight if overweight.  Carry a medical alert card or wear your medical alert jewelry.  Carry a 15 gram carbohydrate snack with you at all times to treat low blood glucose (hypoglycemia). Some examples of 15 gram carbohydrate snacks include:  Glucose tablets, 3 or 4  Raisins, 2 tablespoons (24 grams)  Jelly beans, 6  Animal crackers, 8  Regular pop, 4 ounces (120 mL)  Gummy treats, 9  Recognize hypoglycemia. Hypoglycemia occurs with blood glucose levels of 70 mg/dL and below. The risk for hypoglycemia increases when fasting or skipping meals, during or after intense exercise, and during sleep. Hypoglycemia symptoms can include:  Tremors or shakes.  Decreased ability to concentrate.  Sweating.  Increased heart rate.  Headache.  Dry mouth.  Hunger.  Irritability.  Anxiety.  Restless sleep.  Altered speech or coordination.  Confusion.  Treat hypoglycemia promptly. If you are alert and able to safely swallow, follow the 15:15 rule:  Take 15-20 grams of rapid-acting glucose or carbohydrate. Rapid-acting options include glucose gel, glucose tablets, or 4 ounces (120 mL) of fruit juice, regular soda, or low fat milk.  Check your blood glucose level 15 minutes after taking the glucose.  Take 15-20 grams more of glucose if the repeat blood glucose level is still 70 mg/dL or below.  Eat a meal or snack within 1 hour once blood glucose levels return to normal.  Be alert to feeling very thirsty and urinating more frequently than usual, which are early signs of hyperglycemia. An early awareness of hyperglycemia allows for prompt treatment. Treat hyperglycemia as directed by your health care provider.  Engage in at least 150 minutes of moderate-intensity physical activity a week, spread over at least 3 days of the week or as directed by your health care provider. In  addition, you should engage in resistance exercise at least 2 times a week or as directed by your health care provider.  Adjust your medicine and food intake as needed if you start a new exercise or sport.  Follow your sick day plan at any time you are unable to eat or drink as usual.  Avoid tobacco use.  Limit alcohol intake to no more than 1 drink per day for nonpregnant women and 2 drinks per day for men. You should drink alcohol only when you are also eating food. Talk with your health care provider whether alcohol is safe for you. Tell your health care provider if you drink alcohol several times a week.  Follow up with your health care provider regularly.  Schedule an eye exam soon after the diagnosis of type 2 diabetes and then annually.  Perform daily skin and foot care. Examine your skin and feet daily for cuts, bruises, redness, nail problems, bleeding, blisters, or sores. A foot exam by a health care provider should be done annually.  Brush your teeth and gums at least twice a day and floss at least once a day. Follow up with your dentist regularly.  Share your diabetes management plan with your workplace or school.  Stay up-to-date with   immunizations.  Learn to manage stress.  Obtain ongoing diabetes education and support as needed.  Participate in, or seek rehabilitation as needed to maintain or improve independence and quality of life. Request a physical or occupational therapy referral if you are having foot or hand numbness or difficulties with grooming, dressing, eating, or physical activity. SEEK MEDICAL CARE IF:   You are unable to eat food or drink fluids for more than 6 hours.  You have nausea and vomiting for more than 6 hours.  Your blood glucose level is over 240 mg/dL.  There is a change in mental status.  You develop an additional serious illness.  You have diarrhea for more than 6 hours.  You have been sick or have had a fever for a couple of days  and are not getting better.  You have pain during any physical activity.  SEEK IMMEDIATE MEDICAL CARE IF:  You have difficulty breathing.  You have moderate to large ketone levels. MAKE SURE YOU:  Understand these instructions.  Will watch your condition.  Will get help right away if you are not doing well or get worse. Document Released: 01/12/2005 Document Revised: 01/17/2013 Document Reviewed: 08/11/2011 [employer] Patient Information 2015 [employer]. This information is not intended to replace advice given to you by your health care provider. Make sure you discuss any questions you have with your health care provider.  

## 2013-07-23 NOTE — Progress Notes (Signed)
[facility] [address], [place] [address] [phone]  Date:  07/23/2013   Name:  Holly [name]   DOB:  July 31, 1965   MRN:  [id number]  PCP:  Hulen Shouts, MD    Chief Complaint: CPE   History of Present Illness:  Holly [name] is a 48 y.o. very pleasant female patient who presents with the following:  Wellness examination.  Under treatment for stage 4 kidney disease and NIDDM.  Denies other complaint or health concern today.   Patient Active Problem List   Diagnosis Date Noted  . Menorrhagia 07/19/2011  . Tricuspid regurgitation 10/30/2010  . Anemia 10/30/2010  . Left ventricular hypertrophy 10/30/2010  . Orthostatic hypotension 08/20/2010  . Dysautonomia 08/20/2010  . Diastolic congestive heart failure 07/10/2010  . DIABETES MELLITUS, TYPE II, UNCONTROLLED 04/04/2007  . Severe uncontrolled hypertension 04/04/2007  . EDEMA 04/04/2007  . DEPRESSION 09/08/2006  . PERIPHERAL NEUROPATHY 09/08/2006  . CHOLELITHIASIS 09/08/2006    Past Medical History  Diagnosis Date  . DM2 (diabetes mellitus, type 2)   . Hypercholesteremia   . Chest pain   . Bulimia   . ADD (attention deficit disorder)   . Acute cystitis   . Anemia, unspecified   . Anxiety state, unspecified   . Essential hypertension, benign   . Chronic ulcer of unspecified site   . Chronic inflammatory demyelinating polyneuritis   . Dysthymic disorder   . Peripheral autonomic neuropathy in disorders classified elsewhere(337.1)   . Type II or unspecified type diabetes mellitus with other specified manifestations, not stated as uncontrolled     Cardiac catheterization 7/10 at [facility] cardiology in [facility]: Normal coronary arteries  . Background diabetic retinopathy(362.01)   . Diarrhea     symptomatic  . Edema     moderate  . Esophageal reflux   . HLD (hyperlipidemia)     HDL goal >50, LDL goal <100  . Unspecified essential hypertension     Renal Dopplers 12/23/09 at  [facility] cardiology in [facility]: No significant renal artery stenosis bilaterally  . Disorders of magnesium metabolism   . Insomnia, unspecified   . Irritable bowel syndrome   . Left heart failure   . Muscle weakness (generalized)   . Calculus of kidney   . Dysuria   . Palpitations   . Primary pulmonary hypertension     not seen at CATH  (20mm Hg)  . Neuralgia, neuritis, and radiculitis, unspecified   . Restless legs syndrome (RLS)   . Encounter for long-term (current) use of other medications     Past Surgical History  Procedure Laterality Date  . Tonsillectomy and adenoidectomy    . Cesarean section      History  Substance Use Topics  . Smoking status: Never Smoker   . Smokeless tobacco: Never Used  . Alcohol Use: No    Family History  Problem Relation Age of Onset  . Hypertension Mother   . Myelodysplastic syndrome Father   . Pancreatic cancer      Allergies  Allergen Reactions  . Bactrim Nausea And Vomiting  . Byetta 10 Mcg Pen [Exenatide] Nausea And Vomiting    N and V  . Hctz [Hydrochlorothiazide]     dizziness  . Spironolactone     Dizziness   . Sulfa Antibiotics   . Tekturna [Aliskiren Fumarate] Rash    Patient has no recollection of taking this medicine or of a medication giving her a rash    Medication  list has been reviewed and updated.  Current Outpatient Prescriptions on File Prior to Visit  Medication Sig Dispense Refill  . amLODipine (NORVASC) 5 MG tablet Take 5 mg by mouth daily.      . benazepril (LOTENSIN) 40 MG tablet Take 1 tablet (40 mg total) by mouth daily.  30 tablet  12  . FLUoxetine (PROZAC) 20 MG capsule Take 20 mg by mouth every morning.       . furosemide (LASIX) 20 MG tablet Take 20 mg by mouth daily.       Marland Kitchen glucose blood (ONETOUCH VERIO) test strip 1 each by Other route 2 (two) times daily. And lancets 2/day 250/.61  100 each  12  . insulin detemir (LEVEMIR) 100 UNIT/ML injection Inject 13 Units into the skin 2 (two) times  daily.       Marland Kitchen Carbonyl Iron (PERFECT IRON PO) Take 1 tablet by mouth daily.      . carvedilol (COREG) 25 MG tablet Take 25 mg by mouth 2 (two) times daily with a meal.      . linagliptin (TRADJENTA) 5 MG TABS tablet Take 5 mg by mouth daily.      . nateglinide (STARLIX) 120 MG tablet Take 1 tablet (120 mg total) by mouth 3 (three) times daily before meals.  90 tablet  11  . norethindrone (AYGESTIN) 5 MG tablet Take 1 tablet (5 mg total) by mouth 2 (two) times daily.  30 tablet  1   Current Facility-Administered Medications on File Prior to Visit  Medication Dose Route Frequency Provider Last Rate Last Dose  . cloNIDine (CATAPRES) tablet 0.1 mg  0.1 mg Oral Once Deboraha Sprang, MD        Review of Systems:  As per HPI, otherwise negative.    Physical Examination: Filed Vitals:   07/23/13 0825  BP: 130/82  Pulse: 87  Temp: 97.9 F (36.6 C)  Resp: 16   Filed Vitals:   07/23/13 0825  Height: 5\' 6"  (1.676 m)  Weight: 163 lb (73.936 kg)   Body mass index is 26.32 kg/(m^2). Ideal Body Weight: Weight in (lb) to have BMI = 25: 154.6  GEN: WDWN, NAD, Non-toxic, A & O x 3 HEENT: Atraumatic, Normocephalic. Neck supple. No masses, No LAD. Ears and Nose: No external deformity. CV: RRR, No M/G/R. No JVD. No thrill. No extra heart sounds. PULM: CTA B, no wheezes, crackles, rhonchi. No retractions. No resp. distress. No accessory muscle use. ABD: S, NT, ND, +BS. No rebound. No HSM. EXTR: No c/c/e NEURO Normal gait.  PSYCH: Normally interactive. Conversant. Not depressed or anxious appearing.  Calm demeanor.    Assessment and Plan: Wellness examination Labs pending  Signed,  Ellison Carwin, MD

## 2013-07-25 ENCOUNTER — Encounter (HOSPITAL_COMMUNITY): Payer: BC Managed Care – PPO

## 2013-07-26 ENCOUNTER — Encounter (HOSPITAL_COMMUNITY)
Admission: RE | Admit: 2013-07-26 | Discharge: 2013-07-26 | Disposition: A | Discharge status: Home / Self Care | Payer: Commercial Managed Care - PPO | Source: Ambulatory Visit | Attending: Nephrology | Admitting: Nephrology

## 2013-07-26 DIAGNOSIS — D638 Anemia in other chronic diseases classified elsewhere: Secondary | ICD-10-CM | POA: Insufficient documentation

## 2013-07-26 DIAGNOSIS — N184 Chronic kidney disease, stage 4 (severe): Secondary | ICD-10-CM | POA: Insufficient documentation

## 2013-07-26 NOTE — Progress Notes (Signed)
Patient was here on the wrong day. Her appointment was yesterday. She forgot to take her bp meds today so she opted to go home and take her meds and reschedule appointment

## 2013-08-03 ENCOUNTER — Encounter (HOSPITAL_COMMUNITY)
Admission: RE | Admit: 2013-08-03 | Discharge: 2013-08-03 | Disposition: A | Discharge status: Home / Self Care | Payer: Commercial Managed Care - PPO | Source: Ambulatory Visit | Attending: Nephrology | Admitting: Nephrology

## 2013-08-03 DIAGNOSIS — D638 Anemia in other chronic diseases classified elsewhere: Secondary | ICD-10-CM | POA: Diagnosis not present

## 2013-08-03 DIAGNOSIS — N184 Chronic kidney disease, stage 4 (severe): Secondary | ICD-10-CM | POA: Diagnosis not present

## 2013-08-03 LAB — IRON AND TIBC
IRON: 75 ug/dL (ref 42–135)
Saturation Ratios: 27 % (ref 20–55)
TIBC: 279 ug/dL (ref 250–470)
UIBC: 204 ug/dL (ref 125–400)

## 2013-08-03 LAB — FERRITIN: FERRITIN: 182 ng/mL (ref 10–291)

## 2013-08-03 LAB — POCT HEMOGLOBIN-HEMACUE: Hemoglobin: 10.2 g/dL — ABNORMAL LOW (ref 12.0–15.0)

## 2013-08-03 MED ORDER — EPOETIN ALFA 20000 UNIT/ML IJ SOLN
INTRAMUSCULAR | Status: AC
  Administered 2013-08-03: 20000 [IU] via SUBCUTANEOUS
  Filled 2013-08-03: qty 1

## 2013-08-03 MED ORDER — EPOETIN ALFA 40000 UNIT/ML IJ SOLN: 30000.0000 [IU] | INTRAMUSCULAR | Status: DC

## 2013-08-03 MED ORDER — EPOETIN ALFA 10000 UNIT/ML IJ SOLN
INTRAMUSCULAR | Status: AC
  Administered 2013-08-03: 10000 [IU] via SUBCUTANEOUS
  Filled 2013-08-03: qty 1

## 2013-08-10 NOTE — Telephone Encounter (Signed)
Close Encounter 

## 2013-08-24 ENCOUNTER — Encounter (HOSPITAL_COMMUNITY): Payer: BC Managed Care – PPO

## 2013-08-29 ENCOUNTER — Encounter (HOSPITAL_COMMUNITY)
Admission: RE | Admit: 2013-08-29 | Discharge: 2013-08-29 | Disposition: A | Discharge status: Home / Self Care | Payer: Commercial Managed Care - PPO | Source: Ambulatory Visit | Attending: Nephrology | Admitting: Nephrology

## 2013-08-29 DIAGNOSIS — N184 Chronic kidney disease, stage 4 (severe): Secondary | ICD-10-CM | POA: Insufficient documentation

## 2013-08-29 DIAGNOSIS — D638 Anemia in other chronic diseases classified elsewhere: Secondary | ICD-10-CM | POA: Diagnosis present

## 2013-08-29 LAB — FERRITIN: FERRITIN: 157 ng/mL (ref 10–291)

## 2013-08-29 LAB — IRON AND TIBC
IRON: 86 ug/dL (ref 42–135)
SATURATION RATIOS: 28 % (ref 20–55)
TIBC: 310 ug/dL (ref 250–470)
UIBC: 224 ug/dL (ref 125–400)

## 2013-08-29 LAB — POCT HEMOGLOBIN-HEMACUE: Hemoglobin: 11.6 g/dL — ABNORMAL LOW (ref 12.0–15.0)

## 2013-08-29 MED ORDER — EPOETIN ALFA 10000 UNIT/ML IJ SOLN
INTRAMUSCULAR | Status: AC
  Administered 2013-08-29: 10000 [IU] via SUBCUTANEOUS
  Filled 2013-08-29: qty 1

## 2013-08-29 MED ORDER — EPOETIN ALFA 20000 UNIT/ML IJ SOLN
INTRAMUSCULAR | Status: AC
  Administered 2013-08-29: 20000 [IU] via SUBCUTANEOUS
  Filled 2013-08-29: qty 1

## 2013-08-29 MED ORDER — EPOETIN ALFA 40000 UNIT/ML IJ SOLN: 30000.0000 [IU] | INTRAMUSCULAR | Status: DC

## 2013-09-19 ENCOUNTER — Encounter (HOSPITAL_COMMUNITY): Payer: BC Managed Care – PPO

## 2013-10-05 ENCOUNTER — Encounter (HOSPITAL_COMMUNITY)
Admission: RE | Admit: 2013-10-05 | Discharge: 2013-10-05 | Disposition: A | Discharge status: Home / Self Care | Payer: Commercial Managed Care - PPO | Source: Ambulatory Visit | Attending: Nephrology | Admitting: Nephrology

## 2013-10-05 DIAGNOSIS — D638 Anemia in other chronic diseases classified elsewhere: Secondary | ICD-10-CM | POA: Insufficient documentation

## 2013-10-05 DIAGNOSIS — N184 Chronic kidney disease, stage 4 (severe): Secondary | ICD-10-CM | POA: Insufficient documentation

## 2013-10-05 LAB — IRON AND TIBC
Iron: 104 ug/dL (ref 42–135)
SATURATION RATIOS: 36 % (ref 20–55)
TIBC: 286 ug/dL (ref 250–470)
UIBC: 182 ug/dL (ref 125–400)

## 2013-10-05 LAB — POCT HEMOGLOBIN-HEMACUE: HEMOGLOBIN: 11.3 g/dL — AB (ref 12.0–15.0)

## 2013-10-05 LAB — FERRITIN: Ferritin: 179 ng/mL (ref 10–291)

## 2013-10-05 MED ORDER — EPOETIN ALFA 10000 UNIT/ML IJ SOLN
INTRAMUSCULAR | Status: AC
  Filled 2013-10-05: qty 1

## 2013-10-05 MED ORDER — EPOETIN ALFA 40000 UNIT/ML IJ SOLN: 30000.0000 [IU] | INTRAMUSCULAR | Status: DC

## 2013-10-05 MED ORDER — EPOETIN ALFA 20000 UNIT/ML IJ SOLN
INTRAMUSCULAR | Status: AC
  Administered 2013-10-05: 20000 [IU] via SUBCUTANEOUS
  Filled 2013-10-05: qty 1

## 2013-10-26 ENCOUNTER — Encounter (HOSPITAL_COMMUNITY): Payer: BC Managed Care – PPO

## 2013-11-09 ENCOUNTER — Encounter (HOSPITAL_COMMUNITY): Payer: BC Managed Care – PPO

## 2013-11-22 ENCOUNTER — Encounter (HOSPITAL_COMMUNITY)
Admission: RE | Admit: 2013-11-22 | Discharge: 2013-11-22 | Disposition: A | Discharge status: Home / Self Care | Payer: Commercial Managed Care - PPO | Source: Ambulatory Visit | Attending: Nephrology | Admitting: Nephrology

## 2013-11-22 DIAGNOSIS — N184 Chronic kidney disease, stage 4 (severe): Secondary | ICD-10-CM | POA: Insufficient documentation

## 2013-11-22 DIAGNOSIS — D631 Anemia in chronic kidney disease: Secondary | ICD-10-CM | POA: Insufficient documentation

## 2013-11-22 LAB — FERRITIN: Ferritin: 250 ng/mL (ref 10–291)

## 2013-11-22 LAB — IRON AND TIBC
IRON: 103 ug/dL (ref 42–135)
Saturation Ratios: 35 % (ref 20–55)
TIBC: 294 ug/dL (ref 250–470)
UIBC: 191 ug/dL (ref 125–400)

## 2013-11-22 MED ORDER — EPOETIN ALFA 40000 UNIT/ML IJ SOLN: 30000.0000 [IU] | INTRAMUSCULAR | Status: DC

## 2013-11-22 MED ORDER — EPOETIN ALFA 10000 UNIT/ML IJ SOLN
INTRAMUSCULAR | Status: AC
  Administered 2013-11-22: 10000 [IU] via SUBCUTANEOUS
  Filled 2013-11-22: qty 1

## 2013-11-22 MED ORDER — EPOETIN ALFA 20000 UNIT/ML IJ SOLN
INTRAMUSCULAR | Status: AC
  Administered 2013-11-22: 20000 [IU] via SUBCUTANEOUS
  Filled 2013-11-22: qty 1

## 2013-11-23 LAB — POCT HEMOGLOBIN-HEMACUE: Hemoglobin: 10 g/dL — ABNORMAL LOW (ref 12.0–15.0)

## 2013-12-13 ENCOUNTER — Encounter (HOSPITAL_COMMUNITY)
Admission: RE | Admit: 2013-12-13 | Discharge: 2013-12-13 | Disposition: A | Discharge status: Home / Self Care | Payer: Commercial Managed Care - PPO | Source: Ambulatory Visit | Attending: Nephrology | Admitting: Nephrology

## 2013-12-13 DIAGNOSIS — D638 Anemia in other chronic diseases classified elsewhere: Secondary | ICD-10-CM | POA: Insufficient documentation

## 2013-12-13 DIAGNOSIS — N184 Chronic kidney disease, stage 4 (severe): Secondary | ICD-10-CM | POA: Diagnosis not present

## 2013-12-13 LAB — IRON AND TIBC
IRON: 97 ug/dL (ref 42–135)
Saturation Ratios: 32 % (ref 20–55)
TIBC: 300 ug/dL (ref 250–470)
UIBC: 203 ug/dL (ref 125–400)

## 2013-12-13 LAB — POCT HEMOGLOBIN-HEMACUE: HEMOGLOBIN: 11.3 g/dL — AB (ref 12.0–15.0)

## 2013-12-13 LAB — FERRITIN: Ferritin: 189 ng/mL (ref 10–291)

## 2013-12-13 MED ORDER — EPOETIN ALFA 10000 UNIT/ML IJ SOLN
INTRAMUSCULAR | Status: AC
  Administered 2013-12-13: 10000 [IU] via SUBCUTANEOUS
  Filled 2013-12-13: qty 1

## 2013-12-13 MED ORDER — EPOETIN ALFA 40000 UNIT/ML IJ SOLN: 30000.0000 [IU] | INTRAMUSCULAR | Status: DC

## 2013-12-13 MED ORDER — EPOETIN ALFA 20000 UNIT/ML IJ SOLN
INTRAMUSCULAR | Status: AC
  Administered 2013-12-13: 20000 [IU] via SUBCUTANEOUS
  Filled 2013-12-13: qty 1

## 2014-01-03 ENCOUNTER — Inpatient Hospital Stay (HOSPITAL_COMMUNITY): Admission: RE | Admit: 2014-01-03 | Payer: BC Managed Care – PPO | Source: Ambulatory Visit

## 2014-02-22 ENCOUNTER — Encounter (HOSPITAL_COMMUNITY)
Admission: RE | Admit: 2014-02-22 | Discharge: 2014-02-22 | Disposition: A | Discharge status: Home / Self Care | Payer: Commercial Managed Care - PPO | Source: Ambulatory Visit | Attending: Nephrology | Admitting: Nephrology

## 2014-02-22 DIAGNOSIS — N184 Chronic kidney disease, stage 4 (severe): Secondary | ICD-10-CM | POA: Diagnosis not present

## 2014-02-22 DIAGNOSIS — D631 Anemia in chronic kidney disease: Secondary | ICD-10-CM | POA: Insufficient documentation

## 2014-02-22 LAB — IRON AND TIBC
Iron: 70 ug/dL (ref 42–145)
SATURATION RATIOS: 25 % (ref 20–55)
TIBC: 276 ug/dL (ref 250–470)
UIBC: 206 ug/dL (ref 125–400)

## 2014-02-22 LAB — FERRITIN: Ferritin: 193 ng/mL (ref 10–291)

## 2014-02-22 MED ORDER — EPOETIN ALFA 20000 UNIT/ML IJ SOLN
INTRAMUSCULAR | Status: AC
  Administered 2014-02-22: 20000 [IU]
  Filled 2014-02-22: qty 1

## 2014-02-22 MED ORDER — EPOETIN ALFA 40000 UNIT/ML IJ SOLN: 30000.0000 [IU] | INTRAMUSCULAR | Status: DC

## 2014-02-22 MED ORDER — EPOETIN ALFA 10000 UNIT/ML IJ SOLN
INTRAMUSCULAR | Status: AC
  Administered 2014-02-22: 10000 [IU]
  Filled 2014-02-22: qty 1

## 2014-02-23 LAB — POCT HEMOGLOBIN-HEMACUE: Hemoglobin: 10.6 g/dL — ABNORMAL LOW (ref 12.0–15.0)

## 2014-03-15 ENCOUNTER — Encounter (HOSPITAL_COMMUNITY): Payer: Commercial Managed Care - PPO

## 2014-03-29 ENCOUNTER — Inpatient Hospital Stay (HOSPITAL_COMMUNITY)
Admission: EM | Admit: 2014-03-29 | Discharge: 2014-03-31 | DRG: 690 | Disposition: A | Discharge status: Home / Self Care | Payer: Commercial Managed Care - PPO | Attending: Internal Medicine | Admitting: Internal Medicine

## 2014-03-29 ENCOUNTER — Emergency Department (HOSPITAL_COMMUNITY): Payer: Commercial Managed Care - PPO

## 2014-03-29 ENCOUNTER — Encounter (HOSPITAL_COMMUNITY): Payer: Self-pay

## 2014-03-29 DIAGNOSIS — G6181 Chronic inflammatory demyelinating polyneuritis: Secondary | ICD-10-CM | POA: Diagnosis present

## 2014-03-29 DIAGNOSIS — Z882 Allergy status to sulfonamides status: Secondary | ICD-10-CM

## 2014-03-29 DIAGNOSIS — F341 Dysthymic disorder: Secondary | ICD-10-CM | POA: Diagnosis present

## 2014-03-29 DIAGNOSIS — Z794 Long term (current) use of insulin: Secondary | ICD-10-CM | POA: Diagnosis not present

## 2014-03-29 DIAGNOSIS — D649 Anemia, unspecified: Secondary | ICD-10-CM | POA: Diagnosis present

## 2014-03-29 DIAGNOSIS — I152 Hypertension secondary to endocrine disorders: Secondary | ICD-10-CM | POA: Diagnosis present

## 2014-03-29 DIAGNOSIS — N179 Acute kidney failure, unspecified: Secondary | ICD-10-CM | POA: Diagnosis present

## 2014-03-29 DIAGNOSIS — G2581 Restless legs syndrome: Secondary | ICD-10-CM | POA: Diagnosis present

## 2014-03-29 DIAGNOSIS — N183 Chronic kidney disease, stage 3 (moderate): Secondary | ICD-10-CM | POA: Diagnosis present

## 2014-03-29 DIAGNOSIS — L97529 Non-pressure chronic ulcer of other part of left foot with unspecified severity: Secondary | ICD-10-CM | POA: Diagnosis present

## 2014-03-29 DIAGNOSIS — R109 Unspecified abdominal pain: Secondary | ICD-10-CM

## 2014-03-29 DIAGNOSIS — E780 Pure hypercholesterolemia: Secondary | ICD-10-CM | POA: Diagnosis present

## 2014-03-29 DIAGNOSIS — G629 Polyneuropathy, unspecified: Secondary | ICD-10-CM

## 2014-03-29 DIAGNOSIS — D631 Anemia in chronic kidney disease: Secondary | ICD-10-CM | POA: Diagnosis present

## 2014-03-29 DIAGNOSIS — F411 Generalized anxiety disorder: Secondary | ICD-10-CM | POA: Diagnosis present

## 2014-03-29 DIAGNOSIS — E1159 Type 2 diabetes mellitus with other circulatory complications: Secondary | ICD-10-CM | POA: Diagnosis present

## 2014-03-29 DIAGNOSIS — Z888 Allergy status to other drugs, medicaments and biological substances status: Secondary | ICD-10-CM | POA: Diagnosis not present

## 2014-03-29 DIAGNOSIS — I517 Cardiomegaly: Secondary | ICD-10-CM | POA: Diagnosis present

## 2014-03-29 DIAGNOSIS — E611 Iron deficiency: Secondary | ICD-10-CM | POA: Diagnosis present

## 2014-03-29 DIAGNOSIS — N189 Chronic kidney disease, unspecified: Secondary | ICD-10-CM

## 2014-03-29 DIAGNOSIS — Z881 Allergy status to other antibiotic agents status: Secondary | ICD-10-CM

## 2014-03-29 DIAGNOSIS — E785 Hyperlipidemia, unspecified: Secondary | ICD-10-CM | POA: Diagnosis present

## 2014-03-29 DIAGNOSIS — E11621 Type 2 diabetes mellitus with foot ulcer: Secondary | ICD-10-CM | POA: Diagnosis present

## 2014-03-29 DIAGNOSIS — I5032 Chronic diastolic (congestive) heart failure: Secondary | ICD-10-CM | POA: Diagnosis present

## 2014-03-29 DIAGNOSIS — K219 Gastro-esophageal reflux disease without esophagitis: Secondary | ICD-10-CM | POA: Diagnosis present

## 2014-03-29 DIAGNOSIS — N3080 Other cystitis without hematuria: Principal | ICD-10-CM | POA: Diagnosis present

## 2014-03-29 DIAGNOSIS — E1165 Type 2 diabetes mellitus with hyperglycemia: Secondary | ICD-10-CM | POA: Diagnosis present

## 2014-03-29 DIAGNOSIS — I129 Hypertensive chronic kidney disease with stage 1 through stage 4 chronic kidney disease, or unspecified chronic kidney disease: Secondary | ICD-10-CM | POA: Diagnosis present

## 2014-03-29 DIAGNOSIS — E11319 Type 2 diabetes mellitus with unspecified diabetic retinopathy without macular edema: Secondary | ICD-10-CM | POA: Diagnosis present

## 2014-03-29 DIAGNOSIS — N319 Neuromuscular dysfunction of bladder, unspecified: Secondary | ICD-10-CM | POA: Diagnosis present

## 2014-03-29 DIAGNOSIS — I10 Essential (primary) hypertension: Secondary | ICD-10-CM | POA: Diagnosis present

## 2014-03-29 DIAGNOSIS — IMO0002 Reserved for concepts with insufficient information to code with codable children: Secondary | ICD-10-CM | POA: Diagnosis present

## 2014-03-29 HISTORY — DX: Personal history of other medical treatment: Z92.89

## 2014-03-29 HISTORY — DX: Iron deficiency anemia, unspecified: D50.9

## 2014-03-29 HISTORY — DX: Pneumonia, unspecified organism: J18.9

## 2014-03-29 HISTORY — DX: Chronic kidney disease, stage 4 (severe): N18.4

## 2014-03-29 LAB — CBC WITH DIFFERENTIAL/PLATELET
Basophils Absolute: 0 10*3/uL (ref 0.0–0.1)
Basophils Relative: 0 % (ref 0–1)
Eosinophils Absolute: 0.3 10*3/uL (ref 0.0–0.7)
Eosinophils Relative: 3 % (ref 0–5)
HCT: 31.4 % — ABNORMAL LOW (ref 36.0–46.0)
HEMOGLOBIN: 10.6 g/dL — AB (ref 12.0–15.0)
LYMPHS ABS: 1.5 10*3/uL (ref 0.7–4.0)
Lymphocytes Relative: 12 % (ref 12–46)
MCH: 28.4 pg (ref 26.0–34.0)
MCHC: 33.8 g/dL (ref 30.0–36.0)
MCV: 84.2 fL (ref 78.0–100.0)
MONO ABS: 0.4 10*3/uL (ref 0.1–1.0)
MONOS PCT: 4 % (ref 3–12)
NEUTROS ABS: 9.7 10*3/uL — AB (ref 1.7–7.7)
Neutrophils Relative %: 81 % — ABNORMAL HIGH (ref 43–77)
Platelets: 301 10*3/uL (ref 150–400)
RBC: 3.73 MIL/uL — AB (ref 3.87–5.11)
RDW: 12.9 % (ref 11.5–15.5)
WBC: 12 10*3/uL — AB (ref 4.0–10.5)

## 2014-03-29 LAB — URINALYSIS, ROUTINE W REFLEX MICROSCOPIC
Bilirubin Urine: NEGATIVE
Bilirubin Urine: NEGATIVE
Glucose, UA: >1000 mg/dL — AB
Glucose, UA: >1000 mg/dL — AB
KETONES UR: NEGATIVE mg/dL
Ketones, ur: NEGATIVE mg/dL
NITRITE: NEGATIVE
NITRITE: POSITIVE — AB
PH: 5.5 (ref 5.0–8.0)
PH: 6 (ref 5.0–8.0)
Protein, ur: >300 mg/dL — AB
SPECIFIC GRAVITY, URINE: 1.015 (ref 1.005–1.030)
SPECIFIC GRAVITY, URINE: 1.016 (ref 1.005–1.030)
Urobilinogen, UA: 0.2 mg/dL (ref 0.0–1.0)
Urobilinogen, UA: 0.2 mg/dL (ref 0.0–1.0)

## 2014-03-29 LAB — BASIC METABOLIC PANEL
ANION GAP: 12 (ref 5–15)
BUN: 79 mg/dL — AB (ref 6–23)
CHLORIDE: 98 mmol/L (ref 96–112)
CO2: 22 mmol/L (ref 19–32)
CREATININE: 3.5 mg/dL — AB (ref 0.50–1.10)
Calcium: 9.4 mg/dL (ref 8.4–10.5)
GFR, EST AFRICAN AMERICAN: 16 mL/min — AB (ref >90)
GFR, EST NON AFRICAN AMERICAN: 14 mL/min — AB (ref >90)
Glucose, Bld: 413 mg/dL — ABNORMAL HIGH (ref 70–99)
Potassium: 4.4 mmol/L (ref 3.5–5.1)
Sodium: 132 mmol/L — ABNORMAL LOW (ref 135–145)

## 2014-03-29 LAB — GLUCOSE, CAPILLARY
GLUCOSE-CAPILLARY: 165 mg/dL — AB (ref 70–99)
Glucose-Capillary: 136 mg/dL — ABNORMAL HIGH (ref 70–99)
Glucose-Capillary: 148 mg/dL — ABNORMAL HIGH (ref 70–99)

## 2014-03-29 LAB — I-STAT CG4 LACTIC ACID, ED
LACTIC ACID, VENOUS: 0.51 mmol/L (ref 0.5–2.0)
Lactic Acid, Venous: 0.62 mmol/L (ref 0.5–2.0)

## 2014-03-29 LAB — URINE MICROSCOPIC-ADD ON

## 2014-03-29 LAB — MRSA PCR SCREENING: MRSA BY PCR: NEGATIVE

## 2014-03-29 LAB — CBG MONITORING, ED: GLUCOSE-CAPILLARY: 327 mg/dL — AB (ref 70–99)

## 2014-03-29 LAB — POC URINE PREG, ED: Preg Test, Ur: NEGATIVE

## 2014-03-29 MED ORDER — CLONIDINE HCL 0.1 MG PO TABS
0.1000 mg | ORAL_TABLET | Freq: Once | ORAL | Status: AC
  Administered 2014-03-29: 0.1 mg via ORAL
  Filled 2014-03-29: qty 1

## 2014-03-29 MED ORDER — HEPARIN SODIUM (PORCINE) 5000 UNIT/ML IJ SOLN
5000.0000 [IU] | Freq: Three times a day (TID) | INTRAMUSCULAR | Status: DC
  Administered 2014-03-29 – 2014-03-30 (×2): 5000 [IU] via SUBCUTANEOUS
  Filled 2014-03-29 (×7): qty 1

## 2014-03-29 MED ORDER — OXYCODONE HCL 5 MG PO TABS: 5.0000 mg | ORAL_TABLET | ORAL | Status: DC | PRN

## 2014-03-29 MED ORDER — LINAGLIPTIN 5 MG PO TABS
5.0000 mg | ORAL_TABLET | Freq: Every day | ORAL | Status: DC
  Administered 2014-03-29 – 2014-03-31 (×3): 5 mg via ORAL
  Filled 2014-03-29 (×4): qty 1

## 2014-03-29 MED ORDER — CEFEPIME HCL 2 G IJ SOLR
2.0000 g | Freq: Two times a day (BID) | INTRAMUSCULAR | Status: DC
  Administered 2014-03-29 – 2014-03-31 (×4): 2 g via INTRAVENOUS
  Filled 2014-03-29 (×5): qty 2

## 2014-03-29 MED ORDER — ACETAMINOPHEN 325 MG PO TABS
650.0000 mg | ORAL_TABLET | Freq: Four times a day (QID) | ORAL | Status: DC | PRN
Start: 2014-03-29 — End: 2014-03-31

## 2014-03-29 MED ORDER — CALCITRIOL 0.25 MCG PO CAPS
0.2500 ug | ORAL_CAPSULE | ORAL | Status: DC
  Administered 2014-03-30: 0.25 ug via ORAL
  Filled 2014-03-29: qty 1

## 2014-03-29 MED ORDER — POLYETHYLENE GLYCOL 3350 17 G PO PACK
17.0000 g | PACK | Freq: Every day | ORAL | Status: DC | PRN
  Filled 2014-03-29: qty 1

## 2014-03-29 MED ORDER — ONDANSETRON HCL 4 MG/2ML IJ SOLN: 4.0000 mg | Freq: Four times a day (QID) | INTRAMUSCULAR | Status: DC | PRN

## 2014-03-29 MED ORDER — ONDANSETRON HCL 4 MG PO TABS: 4.0000 mg | ORAL_TABLET | Freq: Four times a day (QID) | ORAL | Status: DC | PRN

## 2014-03-29 MED ORDER — CARVEDILOL 25 MG PO TABS
25.0000 mg | ORAL_TABLET | Freq: Two times a day (BID) | ORAL | Status: DC
  Filled 2014-03-29 (×6): qty 1

## 2014-03-29 MED ORDER — MORPHINE SULFATE 2 MG/ML IJ SOLN
1.0000 mg | INTRAMUSCULAR | Status: DC | PRN
  Administered 2014-03-29: 2 mg via INTRAVENOUS
  Filled 2014-03-29: qty 1

## 2014-03-29 MED ORDER — INSULIN ASPART 100 UNIT/ML ~~LOC~~ SOLN
0.0000 [IU] | Freq: Every day | SUBCUTANEOUS | Status: DC
  Administered 2014-03-30: 2 [IU] via SUBCUTANEOUS

## 2014-03-29 MED ORDER — BENAZEPRIL HCL 40 MG PO TABS
40.0000 mg | ORAL_TABLET | Freq: Every day | ORAL | Status: DC
  Administered 2014-03-29 – 2014-03-31 (×3): 40 mg via ORAL
  Filled 2014-03-29 (×3): qty 1

## 2014-03-29 MED ORDER — ACETAMINOPHEN 650 MG RE SUPP: 650.0000 mg | Freq: Four times a day (QID) | RECTAL | Status: DC | PRN

## 2014-03-29 MED ORDER — FLUOXETINE HCL 20 MG PO CAPS
20.0000 mg | ORAL_CAPSULE | Freq: Every morning | ORAL | Status: DC
  Administered 2014-03-30 – 2014-03-31 (×2): 20 mg via ORAL
  Filled 2014-03-29 (×4): qty 1

## 2014-03-29 MED ORDER — CALCIUM ACETATE (PHOS BINDER) 667 MG PO CAPS
667.0000 mg | ORAL_CAPSULE | Freq: Two times a day (BID) | ORAL | Status: DC
  Administered 2014-03-30 – 2014-03-31 (×3): 667 mg via ORAL
  Filled 2014-03-29 (×7): qty 1

## 2014-03-29 MED ORDER — DEXTROSE 5 % IV SOLN
1.0000 g | Freq: Once | INTRAVENOUS | Status: AC
  Administered 2014-03-29: 1 g via INTRAVENOUS
  Filled 2014-03-29: qty 10

## 2014-03-29 MED ORDER — ZOLPIDEM TARTRATE 5 MG PO TABS
5.0000 mg | ORAL_TABLET | Freq: Every evening | ORAL | Status: DC | PRN
  Administered 2014-03-31: 5 mg via ORAL
  Filled 2014-03-29: qty 1

## 2014-03-29 MED ORDER — INSULIN ASPART 100 UNIT/ML ~~LOC~~ SOLN
0.0000 [IU] | Freq: Three times a day (TID) | SUBCUTANEOUS | Status: DC
  Administered 2014-03-29: 7 [IU] via SUBCUTANEOUS
  Administered 2014-03-30: 2 [IU] via SUBCUTANEOUS
  Administered 2014-03-30: 1 [IU] via SUBCUTANEOUS
  Administered 2014-03-30: 5 [IU] via SUBCUTANEOUS
  Filled 2014-03-29: qty 1

## 2014-03-29 MED ORDER — SODIUM BICARBONATE 650 MG PO TABS
1300.0000 mg | ORAL_TABLET | Freq: Two times a day (BID) | ORAL | Status: DC
  Administered 2014-03-29 – 2014-03-31 (×5): 1300 mg via ORAL
  Filled 2014-03-29 (×8): qty 2

## 2014-03-29 MED ORDER — SODIUM CHLORIDE 0.9 % IV SOLN
INTRAVENOUS | Status: DC
  Administered 2014-03-29 – 2014-03-30 (×3): via INTRAVENOUS

## 2014-03-29 MED ORDER — IOHEXOL 300 MG/ML  SOLN
25.0000 mL | INTRAMUSCULAR | Status: AC
  Administered 2014-03-29: 25 mL via ORAL

## 2014-03-29 MED ORDER — INSULIN DETEMIR 100 UNIT/ML ~~LOC~~ SOLN
13.0000 [IU] | Freq: Two times a day (BID) | SUBCUTANEOUS | Status: DC
  Administered 2014-03-29 – 2014-03-31 (×4): 13 [IU] via SUBCUTANEOUS
  Filled 2014-03-29 (×7): qty 0.13

## 2014-03-29 MED ORDER — MORPHINE SULFATE 4 MG/ML IJ SOLN
4.0000 mg | Freq: Once | INTRAMUSCULAR | Status: AC
  Administered 2014-03-29: 4 mg via INTRAVENOUS
  Filled 2014-03-29: qty 1

## 2014-03-29 MED ORDER — AMLODIPINE BESYLATE 5 MG PO TABS
5.0000 mg | ORAL_TABLET | Freq: Every day | ORAL | Status: DC
  Administered 2014-03-29: 5 mg via ORAL
  Filled 2014-03-29 (×3): qty 1

## 2014-03-29 MED ORDER — DOCUSATE SODIUM 100 MG PO CAPS
100.0000 mg | ORAL_CAPSULE | Freq: Two times a day (BID) | ORAL | Status: DC
  Administered 2014-03-29 – 2014-03-30 (×3): 100 mg via ORAL
  Filled 2014-03-29 (×4): qty 1

## 2014-03-29 NOTE — ED Notes (Signed)
Per MD Pfeiffer, wants to unwrap left foot. Wound/ulcer present on left foot pad, draining yellow/green drainage. Pt states she was seen at PCP this passed Tuesday and had wound assessed and wrapped with gauze bandage, was supposed to be seen this coming Tuesday March 8 for re-assessment and "dressing change." Per pt, dressing has not been changed since Tuesday.

## 2014-03-29 NOTE — ED Notes (Signed)
First set of [facility] done by this RN. Philippa Chester with phlebotomy to get second set then antibiotics to be given.

## 2014-03-29 NOTE — ED Provider Notes (Signed)
CSN: [id number]     Arrival date & time 03/29/14  [id number] History   First MD Initiated Contact with Patient 03/29/14 [phone]     Chief Complaint  Patient presents with  . Abdominal Pain    Patient is a 49 y.o. female presenting with abdominal pain. The history is provided by the patient.  Abdominal Pain Pain location:  LLQ Pain quality: aching   Pain radiates to:  Back Pain severity:  Moderate Onset quality:  Gradual Duration:  1 week Timing:  Intermittent Progression:  Worsening Chronicity:  New Relieved by:  Nothing Worsened by:  Palpation Associated symptoms: cough, nausea and vomiting   Associated symptoms: no chest pain, no constipation, no diarrhea, no dysuria, no fever and no vaginal bleeding   Patient reports she has had LLQ pain for one week It worsened tonight She reports recent evaluation by her nephrologist and had negative urine studies (did not have pain at the time) She reports nonbloody vomiting She has never had this pain  She has h/o chronic kidney disease, not on dialysis  Also - pt reports she has wound to left foot that is bandaged but reports recently completed antibiotics and reports followed by wound center with recent evaluation.   Past Medical History  Diagnosis Date  . DM2 (diabetes mellitus, type 2)   . Hypercholesteremia   . Chest pain   . Bulimia   . ADD (attention deficit disorder)   . Acute cystitis   . Anemia, unspecified   . Anxiety state, unspecified   . Essential hypertension, benign   . Chronic ulcer of unspecified site   . Chronic inflammatory demyelinating polyneuritis   . Dysthymic disorder   . Peripheral autonomic neuropathy in disorders classified elsewhere(337.1)   . Type II or unspecified type diabetes mellitus with other specified manifestations, not stated as uncontrolled     Cardiac catheterization 7/10 at [facility] cardiology in [facility]: Normal coronary arteries  . Background diabetic retinopathy(362.01)   . Diarrhea    symptomatic  . Edema     moderate  . Esophageal reflux   . HLD (hyperlipidemia)     HDL goal >50, LDL goal <100  . Unspecified essential hypertension     Renal Dopplers 12/23/09 at [facility] cardiology in [facility]: No significant renal artery stenosis bilaterally  . Disorders of magnesium metabolism   . Insomnia, unspecified   . Irritable bowel syndrome   . Left heart failure   . Muscle weakness (generalized)   . Calculus of kidney   . Dysuria   . Palpitations   . Primary pulmonary hypertension     not seen at [facility]  (61mm Hg)  . Neuralgia, neuritis, and radiculitis, unspecified   . Restless legs syndrome (RLS)   . Encounter for long-term (current) use of other medications    Past Surgical History  Procedure Laterality Date  . Tonsillectomy and adenoidectomy    . Cesarean section     Family History  Problem Relation Age of Onset  . Hypertension Mother   . Myelodysplastic syndrome Father   . Pancreatic cancer     History  Substance Use Topics  . Smoking status: Never Smoker   . Smokeless tobacco: Never Used  . Alcohol Use: No   OB History    No data available     Review of Systems  Constitutional: Negative for fever.  Respiratory: Positive for cough.   Cardiovascular: Negative for chest pain.  Gastrointestinal: Positive for nausea, vomiting and abdominal pain. Negative  for diarrhea and constipation.  Genitourinary: Negative for dysuria and vaginal bleeding.  All other systems reviewed and are negative.     Allergies  Bactrim; Byetta 10 mcg pen; Hctz; Spironolactone; Sulfa antibiotics; and Tekturna  Home Medications   Prior to Admission medications   Medication Sig Start Date End Date Taking? Authorizing Provider  benazepril (LOTENSIN) 40 MG tablet Take 1 tablet (40 mg total) by mouth daily. 01/07/12  Yes Josue Hector, MD  calcitRIOL (ROCALTROL) 0.25 MCG capsule Take 0.25 mcg by mouth every other day.   Yes Historical Provider, MD  calcium acetate  (PHOSLO) 667 MG capsule Take 667 mg by mouth 2 (two) times daily.   Yes Historical Provider, MD  FLUoxetine (PROZAC) 20 MG capsule Take 20 mg by mouth every morning.    Yes Historical Provider, MD  furosemide (LASIX) 20 MG tablet Take 40 mg by mouth daily.  11/28/10  Yes Josue Hector, MD  glucose blood (ONETOUCH VERIO) test strip 1 each by Other route 2 (two) times daily. And lancets 2/day 250/.61 05/25/12  Yes Renato Shin, MD  insulin detemir (LEVEMIR) 100 UNIT/ML injection Inject 13 Units into the skin 2 (two) times daily.    Yes Historical Provider, MD  linagliptin (TRADJENTA) 5 MG TABS tablet Take 5 mg by mouth daily.   Yes Historical Provider, MD  sodium bicarbonate 650 MG tablet Take 1,300 mg by mouth 2 (two) times daily.   Yes Historical Provider, MD  amLODipine (NORVASC) 5 MG tablet Take 5 mg by mouth daily.    Historical Provider, MD  Carbonyl Iron (PERFECT IRON PO) Take 1 tablet by mouth daily.    Historical Provider, MD  carvedilol (COREG) 25 MG tablet Take 25 mg by mouth 2 (two) times daily with a meal.    Historical Provider, MD  nateglinide (STARLIX) 120 MG tablet Take 1 tablet (120 mg total) by mouth 3 (three) times daily before meals. Patient not taking: Reported on 03/29/2014 05/25/12   Renato Shin, MD  norethindrone (AYGESTIN) 5 MG tablet Take 1 tablet (5 mg total) by mouth 2 (two) times daily. Patient not taking: Reported on 03/29/2014 07/20/11 07/19/12  Elveria Royals, MD   BP 193/97 mmHg  Pulse 92  Temp(Src) 98 F (36.7 C) (Oral)  Resp 16  Ht 5\' 7"  (1.702 m)  Wt 172 lb (78.019 kg)  BMI 26.93 kg/m2  SpO2 100% Physical Exam CONSTITUTIONAL: Well developed/well nourished HEAD: Normocephalic/atraumatic EYES: EOMI/PERRL ENMT: Mucous membranes moist NECK: supple no meningeal signs SPINE/BACK:entire spine nontender CV: S1/S2 noted, no murmurs/rubs/gallops noted LUNGS: Lungs are clear to auscultation bilaterally, no apparent distress ABDOMEN: soft, moderate LLQ tenderness, no  hernia noted, no rebound or guarding, bowel sounds noted throughout abdomen GU:no cva tenderness NEURO: Pt is awake/alert/appropriate, moves all extremitiesx4.  No facial droop.   EXTREMITIES: pulses normal/equal, full ROM.  Left foot is bandaged  SKIN: warm, color normal PSYCH: no abnormalities of mood noted, alert and oriented to situation  ED Course  Procedures   7:10 AM Pt with h/o chronic kidney disease who presents with worsening LLQ pain for 1 week Will obtain CT imaging without IV contrast Labs/imaging pending at this time Signed out to Dr. Vallery Ridge to f/u on imaging/labs  Labs Review Labs Reviewed  BASIC METABOLIC PANEL  CBC WITH DIFFERENTIAL/PLATELET  URINALYSIS, ROUTINE W REFLEX MICROSCOPIC  POC URINE PREG, ED     Medications  iohexol (OMNIPAQUE) 300 MG/ML solution 25 mL (25 mLs Oral Contrast Given 03/29/14 0642)  morphine 4 MG/ML injection 4 mg (4 mg Intravenous Given 03/29/14 [id number])    MDM   Final diagnoses:  None    Nursing notes including past medical history and social history reviewed and considered in documentation Labs/vital reviewed myself and considered during evaluation     Sharyon Cable, MD 03/29/14 [id number]

## 2014-03-29 NOTE — H&P (Signed)
[facility] History and Physical                                                                                    Holly [name], is a 49 y.o. female  MRN: [id number]   DOB - 02/16/1965  Admit Date - 03/29/2014  Outpatient Primary MD for the patient is [name], MD  With History of -  Past Medical History  Diagnosis Date  . DM2 (diabetes mellitus, type 2)   . Hypercholesteremia   . Chest pain   . Bulimia   . ADD (attention deficit disorder)   . Acute cystitis   . Anemia, unspecified   . Anxiety state, unspecified   . Essential hypertension, benign   . Chronic ulcer of unspecified site   . Chronic inflammatory demyelinating polyneuritis   . Dysthymic disorder   . Peripheral autonomic neuropathy in disorders classified elsewhere(337.1)   . Type II or unspecified type diabetes mellitus with other specified manifestations, not stated as uncontrolled     Cardiac catheterization 7/10 at [facility] cardiology in [facility]: Normal coronary arteries  . Background diabetic retinopathy(362.01)   . Diarrhea     symptomatic  . Edema     moderate  . Esophageal reflux   . HLD (hyperlipidemia)     HDL goal >50, LDL goal <100  . Unspecified essential hypertension     Renal Dopplers 12/23/09 at [facility] cardiology in [facility]: No significant renal artery stenosis bilaterally  . Disorders of magnesium metabolism   . Insomnia, unspecified   . Irritable bowel syndrome   . Left heart failure   . Muscle weakness (generalized)   . Calculus of kidney   . Dysuria   . Palpitations   . Primary pulmonary hypertension     not seen at CATH  (72mm Hg)  . Neuralgia, neuritis, and radiculitis, unspecified   . Restless legs syndrome (RLS)   . Encounter for long-term (current) use of other medications       Past Surgical History  Procedure Laterality Date  . Tonsillectomy and adenoidectomy    . Cesarean section      in for   Chief Complaint  Patient presents with  .  Abdominal Pain     HPI Holly [name]  is a 49 y.o. female, underlying history of diabetes mellitus on insulin, dyslipidemia, attention deficit disorder, recurrent cystitis and reported neurogenic bladder, chronic anemia, , hypertension, and peripheral neuropathy.she presents to the hospital after awakening at 1 AM with severe left lower quadrant abdominal pain. For several days prior to this she had been having intermittent vague left lower quadrant abdominal pain. She has not noticed any specific urinary changes and may of had some vague burning and malodorous urine.  In the ER she was found to be afebrile and hypertensive with O2 saturations 100% on room air. She has had worsening of her chronic kidney disease with a BUN of 79 and a creatinine of 3.5with most recent renal function from June 2015 BUN 56 and creatinine 2.89. Her lactic acid was normal at 0.62. She did have leukocytosis of 12,000 with a mild left shift neutrophils 81. Her sugar was  elevated at 413 with concurrent pseudohyponatremia sodium 132. CT of the abdomen and pelvis without contrast obtained in the ER revealed gas in the anterior urinary bladder with a distended urinary bladder and mild hydroureteronephrosis insetting of bladder distention and findings consistent with emphysematous cystitis in a diabetic patient. There was also noted to be a large amount of stool burden present in the colon.  Review of Systems   In addition to the HPI above,  No Fever-chills, myalgias or other constitutional symptoms No Headache, changes with Vision or hearing, new weakness, tingling, numbness in any extremity, No problems swallowing food or Liquids, indigestion/reflux No Chest pain, Cough or Shortness of Breath, palpitations, orthopnea or DOE No nausea vomiting, melena, or hematochezia Mild tissue area and malodor sure but denies frank hematuria No new skin rashes, lesions, masses or bruises, No new joints pains-aches No recent weight gain or  loss No polyuria, polydypsia or polyphagia,  *A full 10 point Review of Systems was done, except as stated above, all other Review of Systems were negative.  Social History History  Substance Use Topics  . Smoking status: Never Smoker   . Smokeless tobacco: Never Used  . Alcohol Use: No    Family History Family History  Problem Relation Age of Onset  . Hypertension Mother   . Myelodysplastic syndrome Father   . Pancreatic cancer      Prior to Admission medications   Medication Sig Start Date End Date Taking? Authorizing Provider  benazepril (LOTENSIN) 40 MG tablet Take 1 tablet (40 mg total) by mouth daily. 01/07/12  Yes Josue Hector, MD  calcitRIOL (ROCALTROL) 0.25 MCG capsule Take 0.25 mcg by mouth every other day.   Yes Historical Provider, MD  calcium acetate (PHOSLO) 667 MG capsule Take 667 mg by mouth 2 (two) times daily.   Yes Historical Provider, MD  FLUoxetine (PROZAC) 20 MG capsule Take 20 mg by mouth every morning.    Yes Historical Provider, MD  furosemide (LASIX) 20 MG tablet Take 40 mg by mouth daily.  11/28/10  Yes Josue Hector, MD  glucose blood (ONETOUCH VERIO) test strip 1 each by Other route 2 (two) times daily. And lancets 2/day 250/.61 05/25/12  Yes Renato Shin, MD  insulin detemir (LEVEMIR) 100 UNIT/ML injection Inject 13 Units into the skin 2 (two) times daily.    Yes Historical Provider, MD  linagliptin (TRADJENTA) 5 MG TABS tablet Take 5 mg by mouth daily.   Yes Historical Provider, MD  sodium bicarbonate 650 MG tablet Take 1,300 mg by mouth 2 (two) times daily.   Yes Historical Provider, MD  amLODipine (NORVASC) 5 MG tablet Take 5 mg by mouth daily.    Historical Provider, MD  Carbonyl Iron (PERFECT IRON PO) Take 1 tablet by mouth daily.    Historical Provider, MD  carvedilol (COREG) 25 MG tablet Take 25 mg by mouth 2 (two) times daily with a meal.    Historical Provider, MD  nateglinide (STARLIX) 120 MG tablet Take 1 tablet (120 mg total) by mouth 3  (three) times daily before meals. Patient not taking: Reported on 03/29/2014 05/25/12   Renato Shin, MD  norethindrone (AYGESTIN) 5 MG tablet Take 1 tablet (5 mg total) by mouth 2 (two) times daily. Patient not taking: Reported on 03/29/2014 07/20/11 07/19/12  Elveria Royals, MD    Allergies  Allergen Reactions  . Bactrim Nausea And Vomiting  . Byetta 10 Mcg Pen [Exenatide] Nausea And Vomiting    N and V  .  Hctz [Hydrochlorothiazide]     dizziness  . Spironolactone     Dizziness   . Sulfa Antibiotics Nausea And Vomiting  . Tekturna [Aliskiren Fumarate] Rash    Patient has no recollection of taking this medicine or of a medication giving her a rash    Physical Exam  Vitals  Blood pressure 178/94, pulse 79, temperature 98 F (36.7 C), temperature source Oral, resp. rate 16, height 5\' 7"  (1.702 m), weight 172 lb (78.019 kg), SpO2 100 %.   General:  In no acute distress, appears healthy and well nourished  Psych:  Normal affect, Denies Suicidal or Homicidal ideations, Awake Alert, Oriented X 3. Speech and thought patterns are clear and appropriate, no apparent short term memory deficits  Neuro:   No focal neurological deficits, CN II through XII intact, Strength 5/5 all 4 extremities, Sensation intact all 4 extremities.  ENT:  Ears and Eyes appear Normal, Conjunctivae clear, PER. Moist oral mucosa without erythema or exudates.  Neck:  Supple, No lymphadenopathy appreciated  Respiratory:  Symmetrical chest wall movement, Good air movement bilaterally, CTAB. Room Air  Cardiac:  RRR, No Murmurs, no LE edema noted, no JVD, No carotid bruits, peripheral pulses palpable at 2+  Abdomen:  Positive bowel sounds, Soft, focally tender over left lower quadrant without guarding or rebounding, Non distended,  No masses appreciated, no obvious hepatosplenomegaly  Skin:  No Cyanosis, Normal Skin Turgor, No Skin Rash or Bruise.  Extremities: Symmetrical without obvious trauma or injury,  no  effusions.  Data Review  CBC  Recent Labs Lab 03/29/14 0630  WBC 12.0*  HGB 10.6*  HCT 31.4*  PLT 301  MCV 84.2  MCH 28.4  MCHC 33.8  RDW 12.9  LYMPHSABS 1.5  MONOABS 0.4  EOSABS 0.3  BASOSABS 0.0    Chemistries   Recent Labs Lab 03/29/14 0630  NA 132*  K 4.4  CL 98  CO2 22  GLUCOSE 413*  BUN 79*  CREATININE 3.50*  CALCIUM 9.4    estimated creatinine clearance is 20.7 mL/min (by C-G formula based on Cr of 3.5).  No results for input(s): TSH, T4TOTAL, T3FREE, THYROIDAB in the last 72 hours.  Invalid input(s): FREET3  Coagulation profile No results for input(s): INR, PROTIME in the last 168 hours.  No results for input(s): DDIMER in the last 72 hours.  Cardiac Enzymes No results for input(s): CKMB, TROPONINI, MYOGLOBIN in the last 168 hours.  Invalid input(s): CK  Invalid input(s): POCBNP  Urinalysis    Component Value Date/Time   COLORURINE YELLOW 03/29/2014 0637   APPEARANCEUR TURBID* 03/29/2014 0637   LABSPEC 1.015 03/29/2014 0637   PHURINE 5.5 03/29/2014 0637   GLUCOSEU >1000* 03/29/2014 0637   HGBUR SMALL* 03/29/2014 0637   BILIRUBINUR NEGATIVE 03/29/2014 0637   KETONESUR NEGATIVE 03/29/2014 0637   PROTEINUR >300* 03/29/2014 0637   UROBILINOGEN 0.2 03/29/2014 0637   NITRITE NEGATIVE 03/29/2014 0637   LEUKOCYTESUR SMALL* 03/29/2014 0637    Imaging results:   Ct Abdomen Pelvis Wo Contrast  03/29/2014   CLINICAL DATA:  LEFT lower quadrant pain and LEFT pelvic pain for 1 week. Difficulty urinating. Urinary tract infection. History of appendectomy. Renal calculus. Stage IV kidney failure.  EXAM: CT ABDOMEN AND PELVIS WITHOUT CONTRAST  TECHNIQUE: Multidetector CT imaging of the abdomen and pelvis was performed following the standard protocol without IV contrast.  COMPARISON:  05/09/2012.  FINDINGS: Musculoskeletal: No aggressive osseous lesions. Negative for fracture no AVN.  Lung Bases: Clear.  Dense mitral annular  calcification noted.  Liver:  Unenhanced CT was performed per clinician order. Lack of IV contrast limits sensitivity and specificity, especially for evaluation of abdominal/pelvic solid viscera. Grossly normal.  Spleen:  Normal.  Gallbladder: Phrygian cap configuration with calcifications in the cap, probably representing small calcified stones.  Common bile duct:  Within normal limits.  Pancreas:  Normal.  Adrenal glands:  Normal.  Kidneys: Punctate nonobstructing LEFT renal collecting system calculi are present. Mild hydroureteronephrosis is present, greater on the LEFT when compared to the RIGHT. LEFT ureter is ectatic extending into the anatomic pelvis. No obstructing ureteral calculi.  Stomach:  Within normal limits.  Small bowel: Duodenum is collapsed. No small bowel obstruction. No small bowel inflammatory changes are identified.  Colon:   Normal appendix.  Large stool burden is present.  Pelvic Genitourinary: Small amount of free fluid in the anatomic pelvis. Edema is present anterior to the sacrum, likely reflecting high volume status. This stranding extends to the region of the aortic bifurcation. There is gas in the anterior bladder wall compatible with emphysematous cystitis. There is also gas within the pelvic veins, likely tracking from emphysematous cystitis.  Peritoneum: No intraperitoneal free air. The emphysematous cystitis and gas tracking in the pelvic veins gives an artifactual appearance of intra-abdominal free air anterior to the bladder. When viewed on sagittal images, this is most compatible with vascular gas. Free air would be expected to extend more extensively along the anterior abdomen.  Vasculature: Atherosclerosis. No gross acute noncontrast vascular abnormality.  Body Wall: Small fat containing periumbilical hernia.  No anasarca.  IMPRESSION: 1. Gas in the anterior urinary bladder wall. The patient has not been catheterized recently and the appearance is compatible with emphysematous cystitis in this diabetic  patient. A gas in the pelvic vasculature is probably venous and due to cystitis. 2. Distended urinary bladder with mild hydroureteronephrosis, probably secondary to bladder distention. 3. Punctate nonobstructing LEFT inferior pole renal collecting system calculus. 4. These results were called by telephone at the time of interpretation on 03/29/2014 at 9:57 am to Dr. Johnney Killian , who verbally acknowledged these results.   Electronically Signed   By: Dereck Ligas M.D.   On: 03/29/2014 09:58     Assessment & Plan  Active Problems:    Emphysematous cystitis/neurogenic bladder -Admit to floor -empiric Cefepime -supportive care with IV fluids and narcotic analgesics -Unable to utilize Toradol in setting of acute on chronic kidney disease -Insert Foley catheter since seems to have a degree of urinary retention and documented hydroureter -Patient previously told it was suspected she had neurogenic bladder but was unable to afford the follow-up testing in the outpatient setting; may benefit from urological consultation this admission    Acute renal failure superimposed on stage 3 chronic kidney disease -Baseline renal function slightly worsened since June 2015 but also had noticed trend of progressive dysfunction over the several years -Unclear if acute component this time related to current acute illness or is more reflective of progressive chronic kidney disease -continue home medications including PhosLo and sodium bicarbonate -appears dehydrated and patient reports poor oral intake so we'll hold Lasix and give IV fluids for now    Diabetes mellitus type 2, uncontrolled -blood glucose at presentation 413 -Continue home Levemir and linagliptin -Add sliding scale insulin -Suspect hyperglycemia related to acute illness -Check hemoglobin A1c    Chronic diastolic heart failure -Appears compensated at the present time    HTN (hypertension) -Blood pressure poorly controlled in setting of ongoing  abdominal pain -  Continue Lotensin, Norvasc, Coreg and Catapres    Peripheral neuropathy    Anemia -current hemoglobin stable and greater than 9.0 -Was on iron replacement prior to admission    DVT Prophylaxis: subcutaneous heparin  Family Communication:   Son at bedside with patient's permission  Code Status: full code    Condition:  stable  Time spent in minutes : 60   ELLIS,ALLISON L. ANP on 03/29/2014 at 12:00 PM  Between 7am to 7pm - Pager - [phone]  After 7pm go to [email or web address] - password [email or web address]  And look for the night coverage person covering me after hours  [facility]

## 2014-03-29 NOTE — Progress Notes (Signed)
Holly [name] [id number] Admitted to 5W 20: 03/29/2014 8:10 PM Attending Provider: Samuella Cota, MD    Holly [name] is a 49 y.o. female patient admitted from ED awake, alert  & orientated  X 3,  Full Code, VSS - Blood pressure 104/61, pulse 75, temperature 98.4 F (36.9 C), temperature source Oral, resp. rate 18, height 5\' 7"  (1.702 m), weight 79.833 kg (176 lb), SpO2 97 %., R/A, no c/o shortness of breath, no c/o chest pain, no distress noted. Non-Tele placed and pt is currently running:normal sinus rhythm.   IV site WDL:  antecubital right, condition patent and no redness with a transparent dsg that's clean dry and intact.  Allergies:   Allergies  Allergen Reactions  . Bactrim Nausea And Vomiting  . Byetta 10 Mcg Pen [Exenatide] Nausea And Vomiting    N and V  . Hctz [Hydrochlorothiazide]     dizziness  . Spironolactone     Dizziness   . Sulfa Antibiotics Nausea And Vomiting  . Tekturna [Aliskiren Fumarate] Rash    Patient has no recollection of taking this medicine or of a medication giving her a rash     Past Medical History  Diagnosis Date  . Bulimia   . Acute cystitis   . Anxiety state, unspecified   . Essential hypertension, benign   . Chronic inflammatory demyelinating polyneuritis   . Dysthymic disorder   . Peripheral autonomic neuropathy in disorders classified elsewhere(337.1)   . Diarrhea     symptomatic  . Edema     moderate  . Esophageal reflux   . HLD (hyperlipidemia)     HDL goal >50, LDL goal <100  . Unspecified essential hypertension     Renal Dopplers 12/23/09 at [facility] cardiology in [facility]: No significant renal artery stenosis bilaterally  . Disorders of magnesium metabolism   . Irritable bowel syndrome   . Left heart failure   . Muscle weakness (generalized)   . Dysuria   . Palpitations   . Primary pulmonary hypertension     not seen at CATH  (9mm Hg)  . Neuralgia, neuritis, and radiculitis, unspecified   . Encounter for  long-term (current) use of other medications   . Pneumonia 2010  . DM2 (diabetes mellitus, type 2)   . Type II or unspecified type diabetes mellitus with other specified manifestations, not stated as uncontrolled     Cardiac catheterization 7/10 at [facility] cardiology in [facility]: Normal coronary arteries  . Background diabetic retinopathy(362.01)   . History of blood transfusion X 6-7; 2010 - present (03/29/2014)    "related to OR; kidney issues"  . Iron deficiency anemia     "suppose to get procrit injections q 3 wks; usually don't" do it" (03/29/2014)  . Calculus of kidney   . Chronic kidney disease, stage IV (severe)     History:  obtained from the patient by admission nurse.  Pt orientation to unit, room and routine. Information packet given to patient/family.  Admission INP armband ID verified with patient/family, and in place. SR up x 2. Pt verbalizes an understanding of how to use the call bell and to call for help before getting out of bed.  Skin, clean-dry- with diabetic ulcer to left sole of foot and under great toe, to be measured by night RN.  Pt. States it was a blister and she pulled the skin off.    Will cont to monitor and assist as needed.  Lindalou Hose, RN 03/29/2014 8:10  PM

## 2014-03-29 NOTE — ED Notes (Signed)
Patient placed in C 29

## 2014-03-29 NOTE — ED Notes (Signed)
Pt bladder scanned pre void 148 ml present, post void scan showed 27 ml urine. Pt states she has to force herself to empty bladder completely.

## 2014-03-29 NOTE — ED Notes (Signed)
Spoke with Dr. Johnney Killian regarding Istat lactic. Will place order for this.

## 2014-03-29 NOTE — ED Notes (Signed)
Ordered carb-modified tray.

## 2014-03-29 NOTE — ED Notes (Signed)
Pt back from CT at this time 

## 2014-03-29 NOTE — ED Notes (Signed)
Pt c/o lower abdominal/groin pain x1 week. Pt reports pain gradually worsening with no relief from Aleve. Pt has some difficulty w/ urination. Hx stage 4 kidney failure - went to urologist last week with no UTI or kidney changes.

## 2014-03-29 NOTE — ED Notes (Signed)
Tray ordered for patient.

## 2014-03-29 NOTE — ED Provider Notes (Signed)
The patient has remained nontoxic and alert throughout her stay in the emergency department. I have reassessed her and she has not required pain control. She does continue to have reproducible left lower and suprapubic pain. CT scan has identified emphysematous cystitis. Urinalysis has many white blood cells and is positive for UTI. The patient is having an exacerbation of chronic kidney failure. At this point Rocephin and fluids have been initiated. The patient will be admitted for ongoing treatment of pyelonephritis, acute on chronic kidney failure with comorbid conditions of diabetes and hypertension.  Charlesetta Shanks, MD 03/29/14 1050

## 2014-03-30 DIAGNOSIS — G629 Polyneuropathy, unspecified: Secondary | ICD-10-CM

## 2014-03-30 DIAGNOSIS — E1165 Type 2 diabetes mellitus with hyperglycemia: Secondary | ICD-10-CM

## 2014-03-30 DIAGNOSIS — I5032 Chronic diastolic (congestive) heart failure: Secondary | ICD-10-CM

## 2014-03-30 DIAGNOSIS — N319 Neuromuscular dysfunction of bladder, unspecified: Secondary | ICD-10-CM

## 2014-03-30 DIAGNOSIS — I10 Essential (primary) hypertension: Secondary | ICD-10-CM

## 2014-03-30 LAB — COMPREHENSIVE METABOLIC PANEL
ALT: 9 U/L (ref 0–35)
AST: 9 U/L (ref 0–37)
Albumin: 2.6 g/dL — ABNORMAL LOW (ref 3.5–5.2)
Alkaline Phosphatase: 50 U/L (ref 39–117)
Anion gap: 10 (ref 5–15)
BILIRUBIN TOTAL: 0.4 mg/dL (ref 0.3–1.2)
BUN: 73 mg/dL — AB (ref 6–23)
CALCIUM: 8.5 mg/dL (ref 8.4–10.5)
CHLORIDE: 108 mmol/L (ref 96–112)
CO2: 23 mmol/L (ref 19–32)
CREATININE: 3.39 mg/dL — AB (ref 0.50–1.10)
GFR, EST AFRICAN AMERICAN: 17 mL/min — AB (ref >90)
GFR, EST NON AFRICAN AMERICAN: 15 mL/min — AB (ref >90)
GLUCOSE: 184 mg/dL — AB (ref 70–99)
Potassium: 4.8 mmol/L (ref 3.5–5.1)
Sodium: 141 mmol/L (ref 135–145)
Total Protein: 6 g/dL (ref 6.0–8.3)

## 2014-03-30 LAB — URINE CULTURE: Colony Count: 35000

## 2014-03-30 LAB — CBC
HCT: 28.4 % — ABNORMAL LOW (ref 36.0–46.0)
Hemoglobin: 9.4 g/dL — ABNORMAL LOW (ref 12.0–15.0)
MCH: 28.3 pg (ref 26.0–34.0)
MCHC: 33.1 g/dL (ref 30.0–36.0)
MCV: 85.5 fL (ref 78.0–100.0)
Platelets: 212 10*3/uL (ref 150–400)
RBC: 3.32 MIL/uL — AB (ref 3.87–5.11)
RDW: 13.1 % (ref 11.5–15.5)
WBC: 9.3 10*3/uL (ref 4.0–10.5)

## 2014-03-30 LAB — GLUCOSE, CAPILLARY
GLUCOSE-CAPILLARY: 144 mg/dL — AB (ref 70–99)
GLUCOSE-CAPILLARY: 225 mg/dL — AB (ref 70–99)
Glucose-Capillary: 161 mg/dL — ABNORMAL HIGH (ref 70–99)
Glucose-Capillary: 173 mg/dL — ABNORMAL HIGH (ref 70–99)
Glucose-Capillary: 260 mg/dL — ABNORMAL HIGH (ref 70–99)

## 2014-03-30 LAB — HEMOGLOBIN A1C
Hgb A1c MFr Bld: 11.4 % — ABNORMAL HIGH (ref 4.8–5.6)
Mean Plasma Glucose: 280 mg/dL

## 2014-03-30 MED ORDER — HYDRALAZINE HCL 20 MG/ML IJ SOLN
10.0000 mg | Freq: Four times a day (QID) | INTRAMUSCULAR | Status: DC | PRN
  Administered 2014-03-31: 10 mg via INTRAVENOUS
  Filled 2014-03-30: qty 1

## 2014-03-30 MED ORDER — LIVING WELL WITH DIABETES BOOK
Freq: Once | Status: DC
Start: 2014-03-30 — End: 2014-03-31
  Filled 2014-03-30: qty 1

## 2014-03-30 NOTE — Plan of Care (Signed)
Problem: Phase I Progression Outcomes Goal: Voiding-avoid urinary catheter unless indicated Outcome: Not Met (add Reason) Pt has urinary catheter for urinary retention

## 2014-03-30 NOTE — Progress Notes (Signed)
[facility] PROGRESS NOTE  Holly [name] [id number] DOB: May 09, 1965 DOA: 03/29/2014 PCP: [name], MD  Assessment/Plan: 1. Emphysematous cystitis/neurogenic bladder 1. Suspected urinary retention on admit 2. Pt is with indwelling foley cath 3. Pt is continued on cefepime with improvement in leukocytosis 4. Will await urine culture 5. Continue IV abx for at least 24hrs, then may consider transition to PO if continues to improve 6. Consider advising timed bathroom visits 2. Acute on CKD3 1. Last Cr of 2.89 on 6/15 2. Cr is improving with hydration 3. Follow renal function 4. Foley cath as tolerated 3. DM2 1. a2c 11.4, uncontrolled 2. Currently on SSI coverage 3. Cont on current insulin for now 4. Chronic diastolic CHF 1. Compensated 2. stable 5. HTN 1. BP had been stable and controlled overnight 2. Continue current regimen as tolerated 6. Peripheral Neuropathy with diabetic foot ulcers 1. Stable 2. Continue dressing changes as tolerated 7. Anemia 1. Hx of iron deficiency 2. hgb stable 3. Continue to monitor  8. DVT prophylaxis 1. Heparin subQ  Code Status: Full Family Communication: Pt in room (indicate person spoken with, relationship, and if by phone, the number) Disposition Plan: Possible home 3/5 if stable and improved   Consultants:    Procedures:    Antibiotics:  Cefepime 3/3>>> (indicate start date, and stop date if known)  HPI/Subjective: Feels better. Wants to go home  Objective: Filed Vitals:   03/29/14 2142 03/30/14 0628 03/30/14 0955 03/30/14 1400  BP: 105/58 142/74 154/79 143/76  Pulse:  74  87  Temp: 98 F (36.7 C) 98.3 F (36.8 C)  98.4 F (36.9 C)  TempSrc: Oral Oral  Oral  Resp: 16 20  18   Height:      Weight:      SpO2: 98% 98%  100%    Intake/Output Summary (Last 24 hours) at 03/30/14 1537 Last data filed at 03/30/14 1428  Gross per 24 hour  Intake 1718.75 ml  Output   2300 ml  Net -581.25 ml   Filed  Weights   03/29/14 0546 03/29/14 1847  Weight: 78.019 kg (172 lb) 79.833 kg (176 lb)    Exam:   General:  Awake, in nad  Cardiovascular: regular, s1, s2  Respiratory: normal resp effort,no wheezing  Abdomen: soft,nondistended  Musculoskeletal: healing ulcer at bottom of L foot with dressings in place over distal foot   Data Reviewed: Basic Metabolic Panel:  Recent Labs Lab 03/29/14 0630 03/30/14 0500  NA 132* 141  K 4.4 4.8  CL 98 108  CO2 22 23  GLUCOSE 413* 184*  BUN 79* 73*  CREATININE 3.50* 3.39*  CALCIUM 9.4 8.5   Liver Function Tests:  Recent Labs Lab 03/30/14 0500  AST 9  ALT 9  ALKPHOS 50  BILITOT 0.4  PROT 6.0  ALBUMIN 2.6*   No results for input(s): LIPASE, AMYLASE in the last 168 hours. No results for input(s): AMMONIA in the last 168 hours. CBC:  Recent Labs Lab 03/29/14 0630 03/30/14 0500  WBC 12.0* 9.3  NEUTROABS 9.7*  --   HGB 10.6* 9.4*  HCT 31.4* 28.4*  MCV 84.2 85.5  PLT 301 212   Cardiac Enzymes: No results for input(s): CKTOTAL, CKMB, CKMBINDEX, TROPONINI in the last 168 hours. BNP (last 3 results) No results for input(s): BNP in the last 8760 hours.  ProBNP (last 3 results) No results for input(s): PROBNP in the last 8760 hours.  CBG:  Recent Labs Lab 03/29/14 2141 03/29/14 2250 03/30/14 0505 03/30/14  [id number] 03/30/14 1154  GLUCAP 148* 165* 161* 144* 173*    Recent Results (from the past 240 hour(s))  Urine culture     Status: None   Collection Time: 03/29/14  6:37 AM  Result Value Ref Range Status   Specimen Description URINE, RANDOM  Final   Special Requests ADDED [id number]  Final   Colony Count   Final    35,000 COLONIES/ML Performed at [facility]    Culture   Final    Multiple bacterial morphotypes present, none predominant. Suggest appropriate recollection if clinically indicated. Performed at [facility]    Report Status 03/30/2014 FINAL  Final  Culture, blood (routine x 2)      Status: None (Preliminary result)   Collection Time: 03/29/14  8:14 AM  Result Value Ref Range Status   Specimen Description BLOOD RIGHT ANTECUBITAL  Final   Special Requests BOTTLES DRAWN AEROBIC AND ANAEROBIC 5CCS  Final   Culture   Final           BLOOD CULTURE RECEIVED NO GROWTH TO DATE CULTURE WILL BE HELD FOR 5 DAYS BEFORE ISSUING A FINAL NEGATIVE REPORT Performed at [facility]    Report Status PENDING  Incomplete  MRSA PCR Screening     Status: None   Collection Time: 03/29/14  7:05 PM  Result Value Ref Range Status   MRSA by PCR NEGATIVE NEGATIVE Final    Comment:        The GeneXpert MRSA Assay (FDA approved for NASAL specimens only), is one component of a comprehensive MRSA colonization surveillance program. It is not intended to diagnose MRSA infection nor to guide or monitor treatment for MRSA infections.      Studies: Ct Abdomen Pelvis Wo Contrast  03/29/2014   CLINICAL DATA:  LEFT lower quadrant pain and LEFT pelvic pain for 1 week. Difficulty urinating. Urinary tract infection. History of appendectomy. Renal calculus. Stage IV kidney failure.  EXAM: CT ABDOMEN AND PELVIS WITHOUT CONTRAST  TECHNIQUE: Multidetector CT imaging of the abdomen and pelvis was performed following the standard protocol without IV contrast.  COMPARISON:  05/09/2012.  FINDINGS: Musculoskeletal: No aggressive osseous lesions. Negative for fracture no AVN.  Lung Bases: Clear.  Dense mitral annular calcification noted.  Liver: Unenhanced CT was performed per clinician order. Lack of IV contrast limits sensitivity and specificity, especially for evaluation of abdominal/pelvic solid viscera. Grossly normal.  Spleen:  Normal.  Gallbladder: Phrygian cap configuration with calcifications in the cap, probably representing small calcified stones.  Common bile duct:  Within normal limits.  Pancreas:  Normal.  Adrenal glands:  Normal.  Kidneys: Punctate nonobstructing LEFT renal collecting system  calculi are present. Mild hydroureteronephrosis is present, greater on the LEFT when compared to the RIGHT. LEFT ureter is ectatic extending into the anatomic pelvis. No obstructing ureteral calculi.  Stomach:  Within normal limits.  Small bowel: Duodenum is collapsed. No small bowel obstruction. No small bowel inflammatory changes are identified.  Colon:   Normal appendix.  Large stool burden is present.  Pelvic Genitourinary: Small amount of free fluid in the anatomic pelvis. Edema is present anterior to the sacrum, likely reflecting high volume status. This stranding extends to the region of the aortic bifurcation. There is gas in the anterior bladder wall compatible with emphysematous cystitis. There is also gas within the pelvic veins, likely tracking from emphysematous cystitis.  Peritoneum: No intraperitoneal free air. The emphysematous cystitis and gas tracking in the pelvic veins gives an  artifactual appearance of intra-abdominal free air anterior to the bladder. When viewed on sagittal images, this is most compatible with vascular gas. Free air would be expected to extend more extensively along the anterior abdomen.  Vasculature: Atherosclerosis. No gross acute noncontrast vascular abnormality.  Body Wall: Small fat containing periumbilical hernia.  No anasarca.  IMPRESSION: 1. Gas in the anterior urinary bladder wall. The patient has not been catheterized recently and the appearance is compatible with emphysematous cystitis in this diabetic patient. A gas in the pelvic vasculature is probably venous and due to cystitis. 2. Distended urinary bladder with mild hydroureteronephrosis, probably secondary to bladder distention. 3. Punctate nonobstructing LEFT inferior pole renal collecting system calculus. 4. These results were called by telephone at the time of interpretation on 03/29/2014 at 9:57 am to Dr. Johnney Killian , who verbally acknowledged these results.   Electronically Signed   By: Dereck Ligas M.D.    On: 03/29/2014 09:58    Scheduled Meds: . amLODipine  5 mg Oral Daily  . benazepril  40 mg Oral Daily  . calcitRIOL  0.25 mcg Oral QODAY  . calcium acetate  667 mg Oral BID WC  . carvedilol  25 mg Oral BID WC  . ceFEPime (MAXIPIME) IV  2 g Intravenous Q12H  . docusate sodium  100 mg Oral BID  . FLUoxetine  20 mg Oral q morning - 10a  . heparin  5,000 Units Subcutaneous 3 times per day  . insulin aspart  0-5 Units Subcutaneous QHS  . insulin aspart  0-9 Units Subcutaneous TID WC  . insulin detemir  13 Units Subcutaneous BID  . linagliptin  5 mg Oral Daily  . living well with diabetes book   Does not apply Once  . sodium bicarbonate  1,300 mg Oral BID   Continuous Infusions: . sodium chloride 125 mL/hr at 03/30/14 1223    Active Problems:   Diabetes mellitus type 2, uncontrolled   Peripheral neuropathy   Chronic diastolic heart failure   Anemia   Acute renal failure superimposed on stage 3 chronic kidney disease   Neurogenic bladder/suspected   Emphysematous cystitis   HTN (hypertension)   [facility], [place] [facility] Pager [phone]. If 7PM-7AM, please contact night-coverage at [email or web address], password [facility] 03/30/2014, 3:37 PM  LOS: 1 day

## 2014-03-30 NOTE — Progress Notes (Signed)
Orthopedic Tech Progress Note Patient Details:  Ann Cranson [facility] Sep 20, 1965 [id number] Unna boot applied to LLE. Patient asked about plan/medication for 1st digit toe. Unsure of what specific plan WOC has. Patient instructed to ask nurse to which she said she would. Ortho Devices Type of Ortho Device: Unna boot Ortho Device/Splint Location: LLE Ortho Device/Splint Interventions: Application   Asia R Thompson 03/30/2014, 3:09 PM

## 2014-03-30 NOTE — Progress Notes (Addendum)
Inpatient Diabetes Program Recommendations  AACE/ADA: New Consensus Statement on Inpatient Glycemic Control (2013)  Target Ranges:  Prepandial:   less than 140 mg/dL      Peak postprandial:   less than 180 mg/dL (1-2 hours)      Critically ill patients:  140 - 180 mg/dL   Results for AHNYAH, SABBATH (MRN [id number]) as of 03/30/2014 13:59  Ref. Range 03/29/2014 21:41 03/29/2014 22:50 03/30/2014 05:05 03/30/2014 07:31 03/30/2014 11:54  Glucose-Capillary Latest Range: 70-99 mg/dL 148 (H) 165 (H) 161 (H) 144 (H) 173 (H)    Reason for Visit:   Diabetes history: Type 2 Outpatient Diabetes medications: Levemir 13 units bid, Tradjenta 5mg /day- is not taking Starlix Current orders for Inpatient glycemic control: Tradjenta 5mg /day, Levemir 13 units 2x/day, Novolog 0-9 units tid with meals, Novolog 0-5 units qhs  Spoke with patient at the bedside.  She was engaged but did not want any resources (dietitian, or outpatient diabetes education)  She was open to receiving the Living Well with diabetes book. Patient tells me it has been over a year since she saw her MD but says she "want to stay healthy to see my children and a grandchild on the way". She says she knows what to do but just hasn't been able to do it.  States Starlix was d/c'd years ago.   Educated on the role of Lantus and how Novolog insulin works- she does understand that Metformin was stopped because of her failing kidney function.  Very surprised her A1C was 11.4%- last time it was checked, the A1C was 10%.   Gentry Fitz, RN, BA, MHA, CDE Diabetes Coordinator Inpatient Diabetes Program  [phone] (Team Pager) [phone] [facility]) 03/30/2014 2:14 PM

## 2014-03-30 NOTE — Consult Note (Signed)
WOC approached by bedside nursing reguarding patients POC for left foot wound.  I have contacted her primary podiatrist Dr. Ermalinda Memos and they have placed her in an Unna's boot with plans to change next Tuesday 04/03/14.  Will have orthopedic tech to replace Unna's boot on the left foot/leg while inpatient and change Q Tuesdays while admitted. Pt to follow up with Dr. Ermalinda Memos at the time of DC.  Supplies ordered and discussed with patient and bedside nurse.  Bedside nurse to page ortho tech when [place] arrives to the unit.   Discussed POC with patient and bedside nurse.  Re consult if needed, will not follow at this time. Thanks  Holly [name], [place] [phone])

## 2014-03-30 NOTE — Progress Notes (Signed)
Patient refusing Coreg and Amlodipine stating "I do not take these at home". Medications not given to patient.

## 2014-03-30 NOTE — Progress Notes (Signed)
Utilization review completed.  

## 2014-03-31 LAB — CBC
HCT: 27.8 % — ABNORMAL LOW (ref 36.0–46.0)
HEMOGLOBIN: 9.2 g/dL — AB (ref 12.0–15.0)
MCH: 28.5 pg (ref 26.0–34.0)
MCHC: 33.1 g/dL (ref 30.0–36.0)
MCV: 86.1 fL (ref 78.0–100.0)
PLATELETS: 228 10*3/uL (ref 150–400)
RBC: 3.23 MIL/uL — ABNORMAL LOW (ref 3.87–5.11)
RDW: 13.1 % (ref 11.5–15.5)
WBC: 7.9 10*3/uL (ref 4.0–10.5)

## 2014-03-31 LAB — BASIC METABOLIC PANEL
Anion gap: 8 (ref 5–15)
BUN: 66 mg/dL — AB (ref 6–23)
CHLORIDE: 107 mmol/L (ref 96–112)
CO2: 21 mmol/L (ref 19–32)
CREATININE: 3.13 mg/dL — AB (ref 0.50–1.10)
Calcium: 8.5 mg/dL (ref 8.4–10.5)
GFR calc Af Amer: 19 mL/min — ABNORMAL LOW (ref >90)
GFR calc non Af Amer: 16 mL/min — ABNORMAL LOW (ref >90)
Glucose, Bld: 107 mg/dL — ABNORMAL HIGH (ref 70–99)
Potassium: 4.4 mmol/L (ref 3.5–5.1)
Sodium: 136 mmol/L (ref 135–145)

## 2014-03-31 LAB — URINE CULTURE: Colony Count: 40000

## 2014-03-31 LAB — GLUCOSE, CAPILLARY: Glucose-Capillary: 77 mg/dL (ref 70–99)

## 2014-03-31 MED ORDER — CEFUROXIME AXETIL 250 MG PO TABS: 250.0000 mg | ORAL_TABLET | Freq: Two times a day (BID) | ORAL | Status: DC

## 2014-03-31 NOTE — Progress Notes (Signed)
Holly [name] to be D/C'd Home per MD order.  Discussed with the patient and all questions fully answered.    Medication List    STOP taking these medications        furosemide 20 MG tablet  Commonly known as:  LASIX     nateglinide 120 MG tablet  Commonly known as:  STARLIX     norethindrone 5 MG tablet  Commonly known as:  AYGESTIN      TAKE these medications        amLODipine 5 MG tablet  Commonly known as:  NORVASC  Take 5 mg by mouth daily.     benazepril 40 MG tablet  Commonly known as:  LOTENSIN  Take 1 tablet (40 mg total) by mouth daily.     calcitRIOL 0.25 MCG capsule  Commonly known as:  ROCALTROL  Take 0.25 mcg by mouth every other day.     calcium acetate 667 MG capsule  Commonly known as:  PHOSLO  Take 667 mg by mouth 2 (two) times daily.     carvedilol 25 MG tablet  Commonly known as:  COREG  Take 25 mg by mouth 2 (two) times daily with a meal.     cefUROXime 250 MG tablet  Commonly known as:  CEFTIN  Take 1 tablet (250 mg total) by mouth 2 (two) times daily with a meal.     FLUoxetine 20 MG capsule  Commonly known as:  PROZAC  Take 20 mg by mouth every morning.     glucose blood test strip  Commonly known as:  ONETOUCH VERIO  1 each by Other route 2 (two) times daily. And lancets 2/day 250/.61     insulin detemir 100 UNIT/ML injection  Commonly known as:  LEVEMIR  Inject 13 Units into the skin 2 (two) times daily.     linagliptin 5 MG Tabs tablet  Commonly known as:  TRADJENTA  Take 5 mg by mouth daily.     PERFECT IRON PO  Take 1 tablet by mouth daily.     sodium bicarbonate 650 MG tablet  Take 1,300 mg by mouth 2 (two) times daily.        VVS, Una boot and wounds with dressing clean dry and intact.  IV catheter discontinued intact. Site without signs and symptoms of complications. Dressing and pressure applied.  An After Visit Summary was printed and given to the patient.  D/c education completed with patient/family  including follow up instructions, medication list, d/c activities limitations if indicated, with other d/c instructions as indicated by MD - patient able to verbalize understanding, all questions fully answered.   Patient instructed to return to ED, call 911, or call MD for any changes in condition.   Patient escorted via [place], and D/C home via private auto.  Delman Cheadle 03/31/2014 11:01 AM

## 2014-03-31 NOTE — Discharge Summary (Signed)
Physician Discharge Summary  Holly [name] [id number] DOB: Oct 07, 1965 DOA: 03/29/2014  PCP: [name], MD  Admit date: 03/29/2014 Discharge date: 03/31/2014  Time spent: 25 minutes  Recommendations for Outpatient Follow-up:  1. Follow up with PCP in 1-2 weeks  Discharge Diagnoses:  Active Problems:   Diabetes mellitus type 2, uncontrolled   Peripheral neuropathy   Chronic diastolic heart failure   Anemia   Acute renal failure superimposed on stage 3 chronic kidney disease   Neurogenic bladder/suspected   Emphysematous cystitis   HTN (hypertension)   Discharge Condition: improved  Diet recommendation: diabetic, heart healthy  Filed Weights   03/29/14 0546 03/29/14 1847  Weight: 78.019 kg (172 lb) 79.833 kg (176 lb)    History of present illness:  Please review h and p from 3/3 for details. Briefly, pt presents with abd painfound to have ARF with UA suggestive of UTI. Abd imaging was notable for emphysematous cystitis. The patient was admitted for further work up.   Hospital Course:  1. Emphysematous cystitis/neurogenic bladder 1. Suspected urinary retention on admit 2. Pt is with indwelling foley cath 3. Pt was continued on cefepime with improvement in leukocytosis and symptoms 4. Urine culture pos for ecoli 5. Advised timed bathroom visits 6. Pt to complete abx course with ceftin on discharge 2. Acute on CKD3 1. Pre-admit Cr of 2.89 on 6/15 2. Cr is improved with hydration 3. DM2 1. a1c 11.4, uncontrolled 2. On SSI coverage while inpatient 3. Cont on current insulin for now 4. Chronic diastolic CHF 1. Compensated 2. stable 5. HTN 1. BP had been stable and controlled 2. Continue current regimen as tolerated 6. Peripheral Neuropathy with diabetic foot ulcers 1. Stable 2. Continue dressing changes as tolerated 7. Anemia 1. Hx of iron deficiency 2. hgb stable 3. Continue to monitor  8. DVT prophylaxis 1. Heparin subQ   Discharge Exam: Filed  Vitals:   03/30/14 2209 03/30/14 2314 03/31/14 0039 03/31/14 0533  BP: 170/87 174/84 127/66 115/54  Pulse: 88 83  80  Temp: 98.6 F (37 C)   98.3 F (36.8 C)  TempSrc: Oral   Oral  Resp: 19     Height:      Weight:      SpO2: 99%   98%    General: Awake, in nad Cardiovascular: regular, s1, s2 Respiratory: normal resp effort,no wheezing  Discharge Instructions     Medication List    STOP taking these medications        nateglinide 120 MG tablet  Commonly known as:  STARLIX     norethindrone 5 MG tablet  Commonly known as:  AYGESTIN      TAKE these medications        amLODipine 5 MG tablet  Commonly known as:  NORVASC  Take 5 mg by mouth daily.     benazepril 40 MG tablet  Commonly known as:  LOTENSIN  Take 1 tablet (40 mg total) by mouth daily.     calcitRIOL 0.25 MCG capsule  Commonly known as:  ROCALTROL  Take 0.25 mcg by mouth every other day.     calcium acetate 667 MG capsule  Commonly known as:  PHOSLO  Take 667 mg by mouth 2 (two) times daily.     carvedilol 25 MG tablet  Commonly known as:  COREG  Take 25 mg by mouth 2 (two) times daily with a meal.     cefUROXime 250 MG tablet  Commonly known as:  CEFTIN  Take  1 tablet (250 mg total) by mouth 2 (two) times daily with a meal.     FLUoxetine 20 MG capsule  Commonly known as:  PROZAC  Take 20 mg by mouth every morning.     glucose blood test strip  Commonly known as:  ONETOUCH VERIO  1 each by Other route 2 (two) times daily. And lancets 2/day 250/.61     insulin detemir 100 UNIT/ML injection  Commonly known as:  LEVEMIR  Inject 13 Units into the skin 2 (two) times daily.     linagliptin 5 MG Tabs tablet  Commonly known as:  TRADJENTA  Take 5 mg by mouth daily.     PERFECT IRON PO  Take 1 tablet by mouth daily.     sodium bicarbonate 650 MG tablet  Take 1,300 mg by mouth 2 (two) times daily.      ASK your doctor about these medications        furosemide 20 MG tablet  Commonly  known as:  LASIX  Take 40 mg by mouth daily.       Allergies  Allergen Reactions  . Bactrim Nausea And Vomiting  . Byetta 10 Mcg Pen [Exenatide] Nausea And Vomiting    N and V  . Hctz [Hydrochlorothiazide]     dizziness  . Spironolactone     Dizziness   . Sulfa Antibiotics Nausea And Vomiting  . Tekturna [Aliskiren Fumarate] Rash    Patient has no recollection of taking this medicine or of a medication giving her a rash      The results of significant diagnostics from this hospitalization (including imaging, microbiology, ancillary and laboratory) are listed below for reference.    Significant Diagnostic Studies: Ct Abdomen Pelvis Wo Contrast  03/29/2014   CLINICAL DATA:  LEFT lower quadrant pain and LEFT pelvic pain for 1 week. Difficulty urinating. Urinary tract infection. History of appendectomy. Renal calculus. Stage IV kidney failure.  EXAM: CT ABDOMEN AND PELVIS WITHOUT CONTRAST  TECHNIQUE: Multidetector CT imaging of the abdomen and pelvis was performed following the standard protocol without IV contrast.  COMPARISON:  05/09/2012.  FINDINGS: Musculoskeletal: No aggressive osseous lesions. Negative for fracture no AVN.  Lung Bases: Clear.  Dense mitral annular calcification noted.  Liver: Unenhanced CT was performed per clinician order. Lack of IV contrast limits sensitivity and specificity, especially for evaluation of abdominal/pelvic solid viscera. Grossly normal.  Spleen:  Normal.  Gallbladder: Phrygian cap configuration with calcifications in the cap, probably representing small calcified stones.  Common bile duct:  Within normal limits.  Pancreas:  Normal.  Adrenal glands:  Normal.  Kidneys: Punctate nonobstructing LEFT renal collecting system calculi are present. Mild hydroureteronephrosis is present, greater on the LEFT when compared to the RIGHT. LEFT ureter is ectatic extending into the anatomic pelvis. No obstructing ureteral calculi.  Stomach:  Within normal limits.  Small  bowel: Duodenum is collapsed. No small bowel obstruction. No small bowel inflammatory changes are identified.  Colon:   Normal appendix.  Large stool burden is present.  Pelvic Genitourinary: Small amount of free fluid in the anatomic pelvis. Edema is present anterior to the sacrum, likely reflecting high volume status. This stranding extends to the region of the aortic bifurcation. There is gas in the anterior bladder wall compatible with emphysematous cystitis. There is also gas within the pelvic veins, likely tracking from emphysematous cystitis.  Peritoneum: No intraperitoneal free air. The emphysematous cystitis and gas tracking in the pelvic veins gives an artifactual appearance of intra-abdominal  free air anterior to the bladder. When viewed on sagittal images, this is most compatible with vascular gas. Free air would be expected to extend more extensively along the anterior abdomen.  Vasculature: Atherosclerosis. No gross acute noncontrast vascular abnormality.  Body Wall: Small fat containing periumbilical hernia.  No anasarca.  IMPRESSION: 1. Gas in the anterior urinary bladder wall. The patient has not been catheterized recently and the appearance is compatible with emphysematous cystitis in this diabetic patient. A gas in the pelvic vasculature is probably venous and due to cystitis. 2. Distended urinary bladder with mild hydroureteronephrosis, probably secondary to bladder distention. 3. Punctate nonobstructing LEFT inferior pole renal collecting system calculus. 4. These results were called by telephone at the time of interpretation on 03/29/2014 at 9:57 am to Dr. Johnney Killian , who verbally acknowledged these results.   Electronically Signed   By: Dereck Ligas M.D.   On: 03/29/2014 09:58    Microbiology: Recent Results (from the past 240 hour(s))  Urine culture     Status: None   Collection Time: 03/29/14  6:37 AM  Result Value Ref Range Status   Specimen Description URINE, RANDOM  Final    Special Requests ADDED [id number]  Final   Colony Count   Final    35,000 COLONIES/ML Performed at [facility]    Culture   Final    Multiple bacterial morphotypes present, none predominant. Suggest appropriate recollection if clinically indicated. Performed at [facility]    Report Status 03/30/2014 FINAL  Final  Culture, blood (routine x 2)     Status: None (Preliminary result)   Collection Time: 03/29/14  8:14 AM  Result Value Ref Range Status   Specimen Description BLOOD RIGHT ANTECUBITAL  Final   Special Requests BOTTLES DRAWN AEROBIC AND ANAEROBIC 5CCS  Final   Culture   Final           BLOOD CULTURE RECEIVED NO GROWTH TO DATE CULTURE WILL BE HELD FOR 5 DAYS BEFORE ISSUING A FINAL NEGATIVE REPORT Performed at [facility]    Report Status PENDING  Incomplete  Culture, blood (routine x 2)     Status: None (Preliminary result)   Collection Time: 03/29/14  8:20 AM  Result Value Ref Range Status   Specimen Description BLOOD RIGHT HAND  Final   Special Requests BOTTLES DRAWN AEROBIC AND ANAEROBIC 10MLS  Final   Culture   Final           BLOOD CULTURE RECEIVED NO GROWTH TO DATE CULTURE WILL BE HELD FOR 5 DAYS BEFORE ISSUING A FINAL NEGATIVE REPORT Performed at [facility]    Report Status PENDING  Incomplete  Urine culture     Status: None (Preliminary result)   Collection Time: 03/29/14  1:31 PM  Result Value Ref Range Status   Specimen Description URINE, RANDOM  Final   Special Requests NONE  Final   Colony Count   Final    40,000 COLONIES/ML Performed at [facility]    Culture   Final    ESCHERICHIA COLI Performed at [facility]    Report Status PENDING  Incomplete  MRSA PCR Screening     Status: None   Collection Time: 03/29/14  7:05 PM  Result Value Ref Range Status   MRSA by PCR NEGATIVE NEGATIVE Final    Comment:        The GeneXpert MRSA Assay (FDA approved for NASAL specimens only), is one component of  a comprehensive  MRSA colonization surveillance program. It is not intended to diagnose MRSA infection nor to guide or monitor treatment for MRSA infections.      Labs: Basic Metabolic Panel:  Recent Labs Lab 03/29/14 0630 03/30/14 0500 03/31/14 0402  NA 132* 141 136  K 4.4 4.8 4.4  CL 98 108 107  CO2 22 23 21   GLUCOSE 413* 184* 107*  BUN 79* 73* 66*  CREATININE 3.50* 3.39* 3.13*  CALCIUM 9.4 8.5 8.5   Liver Function Tests:  Recent Labs Lab 03/30/14 0500  AST 9  ALT 9  ALKPHOS 50  BILITOT 0.4  PROT 6.0  ALBUMIN 2.6*   No results for input(s): LIPASE, AMYLASE in the last 168 hours. No results for input(s): AMMONIA in the last 168 hours. CBC:  Recent Labs Lab 03/29/14 0630 03/30/14 0500 03/31/14 0402  WBC 12.0* 9.3 7.9  NEUTROABS 9.7*  --   --   HGB 10.6* 9.4* 9.2*  HCT 31.4* 28.4* 27.8*  MCV 84.2 85.5 86.1  PLT 301 212 228   Cardiac Enzymes: No results for input(s): CKTOTAL, CKMB, CKMBINDEX, TROPONINI in the last 168 hours. BNP: BNP (last 3 results) No results for input(s): BNP in the last 8760 hours.  ProBNP (last 3 results) No results for input(s): PROBNP in the last 8760 hours.  CBG:  Recent Labs Lab 03/30/14 0731 03/30/14 1154 03/30/14 1711 03/30/14 2209 03/31/14 0756  GLUCAP 144* 173* 260* 225* 77    Signed:  Ardell Aaronson K  [facility] 03/31/2014, 10:48 AM

## 2014-04-04 LAB — CULTURE, BLOOD (ROUTINE X 2): CULTURE: NO GROWTH

## 2014-04-05 LAB — CULTURE, BLOOD (ROUTINE X 2): Culture: NO GROWTH

## 2014-04-24 ENCOUNTER — Encounter (HOSPITAL_COMMUNITY)
Admission: RE | Admit: 2014-04-24 | Discharge: 2014-04-24 | Disposition: A | Discharge status: Home / Self Care | Payer: Commercial Managed Care - PPO | Source: Ambulatory Visit | Attending: Nephrology | Admitting: Nephrology

## 2014-04-24 DIAGNOSIS — N184 Chronic kidney disease, stage 4 (severe): Secondary | ICD-10-CM | POA: Insufficient documentation

## 2014-04-24 DIAGNOSIS — D631 Anemia in chronic kidney disease: Secondary | ICD-10-CM | POA: Diagnosis not present

## 2014-04-24 LAB — POCT HEMOGLOBIN-HEMACUE: Hemoglobin: 9.8 g/dL — ABNORMAL LOW (ref 12.0–15.0)

## 2014-04-24 MED ORDER — EPOETIN ALFA 40000 UNIT/ML IJ SOLN: 30000.0000 [IU] | INTRAMUSCULAR | Status: DC

## 2014-04-24 MED ORDER — EPOETIN ALFA 20000 UNIT/ML IJ SOLN
INTRAMUSCULAR | Status: AC
  Administered 2014-04-24: 20000 [IU] via SUBCUTANEOUS
  Filled 2014-04-24: qty 1

## 2014-04-24 MED ORDER — EPOETIN ALFA 10000 UNIT/ML IJ SOLN
INTRAMUSCULAR | Status: AC
Start: 2014-04-24 — End: 2014-04-24
  Administered 2014-04-24: 10000 [IU] via SUBCUTANEOUS
  Filled 2014-04-24: qty 1

## 2014-04-25 LAB — FERRITIN: FERRITIN: 207 ng/mL (ref 10–291)

## 2014-04-25 LAB — IRON AND TIBC
Iron: 89 ug/dL (ref 42–145)
Saturation Ratios: 34 % (ref 20–55)
TIBC: 262 ug/dL (ref 250–470)
UIBC: 173 ug/dL (ref 125–400)

## 2014-05-15 ENCOUNTER — Inpatient Hospital Stay (HOSPITAL_COMMUNITY): Admission: RE | Admit: 2014-05-15 | Payer: Commercial Managed Care - PPO | Source: Ambulatory Visit

## 2014-06-15 ENCOUNTER — Encounter (HOSPITAL_COMMUNITY)
Admission: RE | Admit: 2014-06-15 | Discharge: 2014-06-15 | Disposition: A | Discharge status: Home / Self Care | Payer: Commercial Managed Care - PPO | Source: Ambulatory Visit | Attending: Nephrology | Admitting: Nephrology

## 2014-06-15 DIAGNOSIS — N184 Chronic kidney disease, stage 4 (severe): Secondary | ICD-10-CM | POA: Insufficient documentation

## 2014-06-15 DIAGNOSIS — D631 Anemia in chronic kidney disease: Secondary | ICD-10-CM | POA: Insufficient documentation

## 2014-06-15 LAB — IRON AND TIBC
Iron: 80 ug/dL (ref 28–170)
SATURATION RATIOS: 28 % (ref 10.4–31.8)
TIBC: 287 ug/dL (ref 250–450)
UIBC: 207 ug/dL

## 2014-06-15 LAB — POCT HEMOGLOBIN-HEMACUE: Hemoglobin: 10.5 g/dL — ABNORMAL LOW (ref 12.0–15.0)

## 2014-06-15 LAB — FERRITIN: FERRITIN: 157 ng/mL (ref 11–307)

## 2014-06-15 MED ORDER — EPOETIN ALFA 40000 UNIT/ML IJ SOLN: 30000.0000 [IU] | INTRAMUSCULAR | Status: DC

## 2014-06-15 MED ORDER — EPOETIN ALFA 10000 UNIT/ML IJ SOLN
INTRAMUSCULAR | Status: AC
  Administered 2014-06-15: 10000 [IU]
  Filled 2014-06-15: qty 1

## 2014-06-15 MED ORDER — EPOETIN ALFA 20000 UNIT/ML IJ SOLN
INTRAMUSCULAR | Status: AC
  Administered 2014-06-15: 20000 [IU]
  Filled 2014-06-15: qty 1

## 2014-06-15 NOTE — Discharge Instructions (Signed)

## 2014-07-05 ENCOUNTER — Other Ambulatory Visit (HOSPITAL_COMMUNITY): Payer: Self-pay | Admitting: *Deleted

## 2014-07-06 ENCOUNTER — Encounter (HOSPITAL_COMMUNITY): Admission: RE | Admit: 2014-07-06 | Payer: Commercial Managed Care - PPO | Source: Ambulatory Visit

## 2014-07-13 ENCOUNTER — Encounter (HOSPITAL_COMMUNITY)
Admission: RE | Admit: 2014-07-13 | Discharge: 2014-07-13 | Disposition: A | Discharge status: Home / Self Care | Payer: Commercial Managed Care - PPO | Source: Ambulatory Visit | Attending: Nephrology | Admitting: Nephrology

## 2014-07-13 DIAGNOSIS — N184 Chronic kidney disease, stage 4 (severe): Secondary | ICD-10-CM | POA: Diagnosis not present

## 2014-07-13 DIAGNOSIS — D631 Anemia in chronic kidney disease: Secondary | ICD-10-CM | POA: Diagnosis not present

## 2014-07-13 LAB — IRON AND TIBC
Iron: 60 ug/dL (ref 28–170)
Saturation Ratios: 19 % (ref 10.4–31.8)
TIBC: 315 ug/dL (ref 250–450)
UIBC: 255 ug/dL

## 2014-07-13 LAB — POCT HEMOGLOBIN-HEMACUE: Hemoglobin: 11.4 g/dL — ABNORMAL LOW (ref 12.0–15.0)

## 2014-07-13 LAB — FERRITIN: Ferritin: 121 ng/mL (ref 11–307)

## 2014-07-13 MED ORDER — EPOETIN ALFA 10000 UNIT/ML IJ SOLN
INTRAMUSCULAR | Status: AC
  Administered 2014-07-13: 10000 [IU] via SUBCUTANEOUS
  Filled 2014-07-13: qty 1

## 2014-07-13 MED ORDER — EPOETIN ALFA 20000 UNIT/ML IJ SOLN
INTRAMUSCULAR | Status: AC
  Administered 2014-07-13: 20000 [IU] via SUBCUTANEOUS
  Filled 2014-07-13: qty 1

## 2014-07-13 MED ORDER — EPOETIN ALFA 40000 UNIT/ML IJ SOLN: 30000.0000 [IU] | INTRAMUSCULAR | Status: DC

## 2014-07-20 ENCOUNTER — Encounter (HOSPITAL_COMMUNITY)
Admission: RE | Admit: 2014-07-20 | Discharge: 2014-07-20 | Disposition: A | Discharge status: Home / Self Care | Payer: Commercial Managed Care - PPO | Source: Ambulatory Visit | Attending: Nephrology | Admitting: Nephrology

## 2014-07-20 DIAGNOSIS — D509 Iron deficiency anemia, unspecified: Secondary | ICD-10-CM | POA: Insufficient documentation

## 2014-07-20 MED ORDER — SODIUM CHLORIDE 0.9 % IV SOLN
510.0000 mg | INTRAVENOUS | Status: DC
  Administered 2014-07-20: 510 mg via INTRAVENOUS
  Filled 2014-07-20: qty 17

## 2014-07-27 ENCOUNTER — Encounter (HOSPITAL_COMMUNITY)
Admission: RE | Admit: 2014-07-27 | Discharge: 2014-07-27 | Disposition: A | Discharge status: Home / Self Care | Payer: Commercial Managed Care - PPO | Source: Ambulatory Visit | Attending: Nephrology | Admitting: Nephrology

## 2014-07-27 DIAGNOSIS — D631 Anemia in chronic kidney disease: Secondary | ICD-10-CM | POA: Diagnosis not present

## 2014-07-27 DIAGNOSIS — N184 Chronic kidney disease, stage 4 (severe): Secondary | ICD-10-CM | POA: Diagnosis not present

## 2014-07-27 MED ORDER — SODIUM CHLORIDE 0.9 % IV SOLN
510.0000 mg | INTRAVENOUS | Status: AC
  Administered 2014-07-27: 510 mg via INTRAVENOUS
  Filled 2014-07-27: qty 17

## 2014-08-03 ENCOUNTER — Encounter (HOSPITAL_COMMUNITY): Admission: RE | Admit: 2014-08-03 | Payer: Commercial Managed Care - PPO | Source: Ambulatory Visit

## 2014-08-09 ENCOUNTER — Other Ambulatory Visit: Payer: Self-pay

## 2014-08-09 DIAGNOSIS — N184 Chronic kidney disease, stage 4 (severe): Secondary | ICD-10-CM

## 2014-08-09 DIAGNOSIS — Z01810 Encounter for preprocedural cardiovascular examination: Secondary | ICD-10-CM

## 2014-08-11 ENCOUNTER — Ambulatory Visit: Payer: Commercial Managed Care - PPO | Admitting: Podiatry

## 2014-08-17 ENCOUNTER — Ambulatory Visit: Payer: Commercial Managed Care - PPO | Admitting: Podiatry

## 2014-08-21 ENCOUNTER — Encounter (HOSPITAL_COMMUNITY)
Admission: RE | Admit: 2014-08-21 | Discharge: 2014-08-21 | Disposition: A | Discharge status: Home / Self Care | Payer: Commercial Managed Care - PPO | Source: Ambulatory Visit | Attending: Nephrology | Admitting: Nephrology

## 2014-08-21 DIAGNOSIS — D631 Anemia in chronic kidney disease: Secondary | ICD-10-CM | POA: Diagnosis not present

## 2014-08-21 LAB — IRON AND TIBC
Iron: 72 ug/dL (ref 28–170)
SATURATION RATIOS: 28 % (ref 10.4–31.8)
TIBC: 260 ug/dL (ref 250–450)
UIBC: 188 ug/dL

## 2014-08-21 LAB — POCT HEMOGLOBIN-HEMACUE: Hemoglobin: 12 g/dL (ref 12.0–15.0)

## 2014-08-21 LAB — FERRITIN: Ferritin: 467 ng/mL — ABNORMAL HIGH (ref 11–307)

## 2014-08-21 MED ORDER — EPOETIN ALFA 40000 UNIT/ML IJ SOLN: 30000.0000 [IU] | INTRAMUSCULAR | Status: DC

## 2014-08-31 ENCOUNTER — Ambulatory Visit: Payer: Commercial Managed Care - PPO | Admitting: Podiatry

## 2014-09-03 ENCOUNTER — Other Ambulatory Visit (HOSPITAL_COMMUNITY): Payer: Self-pay

## 2014-09-04 ENCOUNTER — Encounter (HOSPITAL_COMMUNITY)
Admission: RE | Admit: 2014-09-04 | Discharge: 2014-09-04 | Disposition: A | Discharge status: Home / Self Care | Payer: Commercial Managed Care - PPO | Source: Ambulatory Visit | Attending: Nephrology | Admitting: Nephrology

## 2014-09-04 DIAGNOSIS — N184 Chronic kidney disease, stage 4 (severe): Secondary | ICD-10-CM | POA: Insufficient documentation

## 2014-09-04 DIAGNOSIS — D631 Anemia in chronic kidney disease: Secondary | ICD-10-CM | POA: Insufficient documentation

## 2014-09-04 LAB — POCT HEMOGLOBIN-HEMACUE: Hemoglobin: 11.3 g/dL — ABNORMAL LOW (ref 12.0–15.0)

## 2014-09-04 MED ORDER — EPOETIN ALFA 10000 UNIT/ML IJ SOLN
INTRAMUSCULAR | Status: AC
  Administered 2014-09-04: 10000 [IU]
  Filled 2014-09-04: qty 1

## 2014-09-04 MED ORDER — FERUMOXYTOL INJECTION 510 MG/17 ML
510.0000 mg | Freq: Once | INTRAVENOUS | Status: AC
  Administered 2014-09-04: 510 mg via INTRAVENOUS
  Filled 2014-09-04: qty 17

## 2014-09-04 MED ORDER — EPOETIN ALFA 20000 UNIT/ML IJ SOLN
INTRAMUSCULAR | Status: AC
  Administered 2014-09-04: 20000 [IU]
  Filled 2014-09-04: qty 1

## 2014-09-04 MED ORDER — EPOETIN ALFA 40000 UNIT/ML IJ SOLN: 30000.0000 [IU] | INTRAMUSCULAR | Status: DC

## 2014-09-13 ENCOUNTER — Encounter: Payer: Self-pay | Admitting: Vascular Surgery

## 2014-09-14 ENCOUNTER — Encounter (HOSPITAL_COMMUNITY): Payer: Commercial Managed Care - PPO

## 2014-09-14 ENCOUNTER — Other Ambulatory Visit (HOSPITAL_COMMUNITY): Payer: Commercial Managed Care - PPO

## 2014-09-14 ENCOUNTER — Ambulatory Visit: Payer: Commercial Managed Care - PPO | Admitting: Vascular Surgery

## 2014-09-25 ENCOUNTER — Inpatient Hospital Stay (HOSPITAL_COMMUNITY): Admission: RE | Admit: 2014-09-25 | Payer: Commercial Managed Care - PPO | Source: Ambulatory Visit

## 2014-10-02 ENCOUNTER — Encounter (HOSPITAL_COMMUNITY)
Admission: RE | Admit: 2014-10-02 | Discharge: 2014-10-02 | Disposition: A | Discharge status: Home / Self Care | Payer: Commercial Managed Care - PPO | Source: Ambulatory Visit | Attending: Nephrology | Admitting: Nephrology

## 2014-10-02 DIAGNOSIS — D631 Anemia in chronic kidney disease: Secondary | ICD-10-CM | POA: Diagnosis present

## 2014-10-02 DIAGNOSIS — N184 Chronic kidney disease, stage 4 (severe): Secondary | ICD-10-CM | POA: Insufficient documentation

## 2014-10-02 LAB — IRON AND TIBC
Iron: 50 ug/dL (ref 28–170)
SATURATION RATIOS: 21 % (ref 10.4–31.8)
TIBC: 238 ug/dL — ABNORMAL LOW (ref 250–450)
UIBC: 188 ug/dL

## 2014-10-02 LAB — FERRITIN: Ferritin: 550 ng/mL — ABNORMAL HIGH (ref 11–307)

## 2014-10-02 LAB — POCT HEMOGLOBIN-HEMACUE: HEMOGLOBIN: 11.6 g/dL — AB (ref 12.0–15.0)

## 2014-10-02 MED ORDER — EPOETIN ALFA 40000 UNIT/ML IJ SOLN: 30000.0000 [IU] | INTRAMUSCULAR | Status: DC

## 2014-10-02 MED ORDER — EPOETIN ALFA 20000 UNIT/ML IJ SOLN
INTRAMUSCULAR | Status: AC
  Administered 2014-10-02: 20000 [IU] via SUBCUTANEOUS
  Filled 2014-10-02: qty 1

## 2014-10-02 MED ORDER — EPOETIN ALFA 10000 UNIT/ML IJ SOLN
INTRAMUSCULAR | Status: AC
  Administered 2014-10-02: 10000 [IU] via SUBCUTANEOUS
  Filled 2014-10-02: qty 1

## 2014-10-22 ENCOUNTER — Other Ambulatory Visit (HOSPITAL_COMMUNITY): Payer: Self-pay | Admitting: *Deleted

## 2014-10-23 ENCOUNTER — Encounter (HOSPITAL_COMMUNITY)
Admission: RE | Admit: 2014-10-23 | Discharge: 2014-10-23 | Disposition: A | Discharge status: Home / Self Care | Payer: Commercial Managed Care - PPO | Source: Ambulatory Visit | Attending: Nephrology | Admitting: Nephrology

## 2014-10-23 DIAGNOSIS — D631 Anemia in chronic kidney disease: Secondary | ICD-10-CM | POA: Diagnosis not present

## 2014-10-23 LAB — POCT HEMOGLOBIN-HEMACUE: Hemoglobin: 12.1 g/dL (ref 12.0–15.0)

## 2014-10-23 MED ORDER — EPOETIN ALFA 40000 UNIT/ML IJ SOLN: 30000.0000 [IU] | INTRAMUSCULAR | Status: DC

## 2014-10-23 MED ORDER — FERUMOXYTOL INJECTION 510 MG/17 ML
510.0000 mg | Freq: Once | INTRAVENOUS | Status: AC
  Administered 2014-10-23: 510 mg via INTRAVENOUS
  Filled 2014-10-23: qty 17

## 2014-11-06 ENCOUNTER — Encounter: Payer: Self-pay | Admitting: Vascular Surgery

## 2014-11-06 ENCOUNTER — Encounter (HOSPITAL_COMMUNITY): Payer: Commercial Managed Care - PPO

## 2014-11-07 ENCOUNTER — Ambulatory Visit (INDEPENDENT_AMBULATORY_CARE_PROVIDER_SITE_OTHER): Payer: Commercial Managed Care - PPO | Admitting: Vascular Surgery

## 2014-11-07 ENCOUNTER — Ambulatory Visit (HOSPITAL_COMMUNITY)
Admission: RE | Admit: 2014-11-07 | Discharge: 2014-11-07 | Disposition: A | Discharge status: Home / Self Care | Payer: Commercial Managed Care - PPO | Source: Ambulatory Visit | Attending: Vascular Surgery | Admitting: Vascular Surgery

## 2014-11-07 ENCOUNTER — Encounter: Payer: Self-pay | Admitting: Vascular Surgery

## 2014-11-07 ENCOUNTER — Other Ambulatory Visit: Payer: Self-pay

## 2014-11-07 VITALS — BP 198/99 | HR 84 | Temp 97.8°F | Resp 16 | Ht 67.0 in | Wt 172.0 lb

## 2014-11-07 DIAGNOSIS — E785 Hyperlipidemia, unspecified: Secondary | ICD-10-CM | POA: Diagnosis not present

## 2014-11-07 DIAGNOSIS — N184 Chronic kidney disease, stage 4 (severe): Secondary | ICD-10-CM | POA: Diagnosis not present

## 2014-11-07 DIAGNOSIS — E119 Type 2 diabetes mellitus without complications: Secondary | ICD-10-CM | POA: Diagnosis not present

## 2014-11-07 DIAGNOSIS — I129 Hypertensive chronic kidney disease with stage 1 through stage 4 chronic kidney disease, or unspecified chronic kidney disease: Secondary | ICD-10-CM | POA: Diagnosis not present

## 2014-11-07 DIAGNOSIS — Z01810 Encounter for preprocedural cardiovascular examination: Secondary | ICD-10-CM

## 2014-11-07 NOTE — Progress Notes (Signed)
[facility] [place]  Patient name: Holly [name] MRN: [id number] DOB: Nov 26, 1965 Sex: female  REASON FOR CONSULT: evaluate for hemodialysis access.  HPI: Holly [name] is a 49 y.o. female, who is not yet on dialysis. She has chronic kidney disease secondary to hypertension and diabetes. She has had some fatigue related to her chronic kidney disease but denies any other uremic symptoms including nausea, vomiting, anorexia, or palpitations.  She does have wounds on both feet which she has had for some time. She developed a wound on her left foot in February 2015. She developed a wound on the right foot 6 months ago. These are both on the plantar aspect of her feet. She denies any fever or chills.  I have reviewed the records from [facility]. On 08/07/2014 or creatinine was 2.93. GFR was 18.  Past Medical History  Diagnosis Date  . Bulimia   . Acute cystitis   . Anxiety state, unspecified   . Essential hypertension, benign   . 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 Dysthymic disorder   . Peripheral autonomic neuropathy in disorders classified elsewhere(337.1)   . Diarrhea     symptomatic  . Edema     moderate  . Esophageal reflux   . HLD (hyperlipidemia)     HDL goal >50, LDL goal <100  . Unspecified essential hypertension     Renal Dopplers 12/23/09 at [facility] in [facility]: No significant renal artery stenosis bilaterally  . Disorders of magnesium metabolism   . Irritable bowel syndrome   . Left heart failure ([place])   . Muscle weakness (generalized)   . Dysuria   . Palpitations   . Primary pulmonary hypertension (HCC)     not seen at [facility]  (65mm Hg)  . Neuralgia, neuritis, and radiculitis, unspecified   . Encounter for long-term (current) use of other medications   . Pneumonia 2010  . DM2 (diabetes mellitus, type 2) ([place])   . Type II or unspecified type diabetes mellitus with other specified  manifestations, not stated as uncontrolled     Cardiac catheterization 7/10 at [facility] cardiology in [facility]: Normal coronary arteries  . Background diabetic retinopathy(362.01)   . History of blood transfusion X 6-7; 2010 - present (03/29/2014)    "related to OR; kidney issues"  . Iron deficiency anemia     "suppose to get procrit injections q 3 wks; usually don't" do it" (03/29/2014)  . Calculus of kidney   . Chronic kidney disease, stage IV (severe) (HCC)    Family History  Problem Relation Age of Onset  . Hypertension Mother   . Myelodysplastic syndrome Father   . Pancreatic cancer     SOCIAL HISTORY: Social History   Social History  . Marital Status: Single    Spouse Name: N/A  . Number of Children: 4  . Years of Education: N/A   Occupational History  . collections    Social History Main Topics  . Smoking status: Never Smoker   . Smokeless tobacco: Never Used  . Alcohol Use: Yes     Comment: 03/29/2014 "might have a drink a couple times/yr"  . Drug Use: No  . Sexual Activity: Yes   Other Topics Concern  . Not on file   Social History Narrative   Allergies  Allergen Reactions  . Bactrim Nausea And Vomiting  . Byetta 10 Mcg Pen [Exenatide] Nausea And Vomiting    N and V  . Hctz [  Hydrochlorothiazide]     dizziness  . Spironolactone     Dizziness   . Sulfa Antibiotics Nausea And Vomiting  . Tekturna [Aliskiren Fumarate] Rash    Patient has no recollection of taking this medicine or of a medication giving her a rash   Current Outpatient Prescriptions  Medication Sig Dispense Refill  . amLODipine (NORVASC) 5 MG tablet Take 5 mg by mouth daily.    . benazepril (LOTENSIN) 40 MG tablet Take 1 tablet (40 mg total) by mouth daily. 30 tablet 12  . calcitRIOL (ROCALTROL) 0.25 MCG capsule Take 0.25 mcg by mouth every other day.    . calcium acetate (PHOSLO) 667 MG capsule Take 667 mg by mouth 2 (two) times daily.    Marland Kitchen Carbonyl Iron (PERFECT IRON PO) Take 1 tablet by  mouth daily.    . cloNIDine (CATAPRES) 0.1 MG tablet Take 0.1 mg by mouth 2 (two) times daily.    Marland Kitchen FLUoxetine (PROZAC) 20 MG capsule Take 20 mg by mouth every morning.     . furosemide (LASIX) 40 MG tablet Take 40 mg by mouth daily.  6  . glucose blood (ONETOUCH VERIO) test strip 1 each by Other route 2 (two) times daily. And lancets 2/day 250/.61 100 each 12  . insulin detemir (LEVEMIR) 100 UNIT/ML injection Inject 13 Units into the skin 2 (two) times daily.     Marland Kitchen linagliptin (TRADJENTA) 5 MG TABS tablet Take 5 mg by mouth daily.    . sodium bicarbonate 650 MG tablet Take 1,300 mg by mouth 2 (two) times daily.    . carvedilol (COREG) 25 MG tablet Take 25 mg by mouth 2 (two) times daily with a meal.     Current Facility-Administered Medications  Medication Dose Route Frequency Provider Last Rate Last Dose  . cloNIDine (CATAPRES) tablet 0.1 mg  0.1 mg Oral Once Deboraha Sprang, MD       REVIEW OF SYSTEMS:  [X]  denotes positive finding, [ ]  denotes negative finding Cardiac  Comments:  Chest pain or chest pressure:    Shortness of breath upon exertion:    Short of breath when lying flat:    Irregular heart rhythm:        Vascular    Pain in calf, thigh, or hip brought on by ambulation:    Pain in feet at night that wakes you up from your sleep:     Blood clot in your veins:    Leg swelling:  X       Pulmonary    Oxygen at home:    Productive cough:     Wheezing:         Neurologic    Sudden weakness in arms or legs:     Sudden numbness in arms or legs:     Sudden onset of difficulty speaking or slurred speech:    Temporary loss of vision in one eye:     Problems with dizziness:         Gastrointestinal    Blood in stool:     Vomited blood:         Genitourinary    Burning when urinating:     Blood in urine:        Psychiatric    Major depression:         Hematologic    Bleeding problems:    Problems with blood clotting too easily:        Skin    Rashes or ulcers:  X       Constitutional    Fever or chills:      PHYSICAL EXAM: Filed Vitals:   11/07/14 1241 11/07/14 1244  BP: 180/99 198/99  Pulse: 82 84  Temp: 97.8 F (36.6 C)   TempSrc: Oral   Resp: 16   Height: 5\' 7"  (1.702 m)   Weight: 172 lb (78.019 kg)   SpO2: 100%    GENERAL: The patient is a well-nourished female, in no acute distress. The vital signs are documented above. CARDIAC: There is a regular rate and rhythm.  VASCULAR: I do not detect carotid bruits. She has a palpable right radial pulse and a slightly diminished left radial pulse. She has palpable femoral, popliteal, dorsalis pedis, and posterior tibial pulses bilaterally. PULMONARY: There is good air exchange bilaterally without wheezing or rales. ABDOMEN: Soft and non-tender with normal pitched bowel sounds.  MUSCULOSKELETAL: There are no major deformities or cyanosis. NEUROLOGIC: No focal weakness or paresthesias are detected. SKIN: she has a wound on her right foot on the plantar aspect that measures 10 mm x 10 mm. This is a diabetic ulcer. She has a wound on her left foot which measures 20 mm x 15 mm. This is also a diabetic ulcer. PSYCHIATRIC: The patient has a normal affect.  DATA:  I have independently interpreted her vein mapping today. She does not appear to have adequate vein in the left arm for a fistula. On the right side the forearm cephalic vein and upper arm cephalic vein looked potentially usable for fistula. The basilic vein looks quite small.  I have independently interpreted her arterial Doppler study today which shows triphasic waveforms in the radial and ulnar positions bilaterally.  MEDICAL ISSUES:  STAGE IV CHRONIC KIDNEY DISEASE: She appears to be a candidate for an AV fistula in the right arm. Even a sheath right-handed I do not see any usable veins in the left arm. Given that she has wounds on both feet I would not recommend placing an AV graft at this time. However she's an stage IV chronic  kidney disease and we would likely not place a graft anyway unless her kidney function continued to deteriorate. She is scheduled for placement of a right AV fistula on 11/27/2014. I have explained the indications for placement of an AV fistula or AV graft. I've explained that if at all possible we will place an AV fistula.  I have reviewed the risks of placement of an AV fistula including but not limited to: failure of the fistula to mature, need for subsequent interventions, and thrombosis. In addition I have reviewed the potential complications of placement of an AV graft. These risks include, but are not limited to, graft thrombosis, graft infection, wound healing problems, bleeding, arm swelling, and steal syndrome. All the patient's questions were answered and they are agreeable to proceed with surgery.   HYPERTENSION: The patient's initial blood pressure today was elevated. We repeated this and this was still elevated. We have encouraged the patient to follow up with their primary care physician for management of their blood pressure.  Deitra Mayo [facility] [place]: [phone]

## 2014-11-27 ENCOUNTER — Encounter (HOSPITAL_COMMUNITY): Admission: RE | Payer: Self-pay | Source: Ambulatory Visit

## 2014-11-27 ENCOUNTER — Ambulatory Visit (HOSPITAL_COMMUNITY)
Admission: RE | Admit: 2014-11-27 | Payer: Commercial Managed Care - PPO | Source: Ambulatory Visit | Admitting: Vascular Surgery

## 2014-11-27 SURGERY — ARTERIOVENOUS (AV) FISTULA CREATION
Anesthesia: Monitor Anesthesia Care | Laterality: Right

## 2014-11-28 ENCOUNTER — Encounter (HOSPITAL_COMMUNITY)
Admission: RE | Admit: 2014-11-28 | Discharge: 2014-11-28 | Disposition: A | Discharge status: Home / Self Care | Payer: Commercial Managed Care - PPO | Source: Ambulatory Visit | Attending: Nephrology | Admitting: Nephrology

## 2014-11-28 DIAGNOSIS — D631 Anemia in chronic kidney disease: Secondary | ICD-10-CM | POA: Diagnosis present

## 2014-11-28 DIAGNOSIS — N184 Chronic kidney disease, stage 4 (severe): Secondary | ICD-10-CM | POA: Diagnosis not present

## 2014-11-28 LAB — POCT HEMOGLOBIN-HEMACUE: HEMOGLOBIN: 10.7 g/dL — AB (ref 12.0–15.0)

## 2014-11-28 LAB — IRON AND TIBC
Iron: 85 ug/dL (ref 28–170)
SATURATION RATIOS: 33 % — AB (ref 10.4–31.8)
TIBC: 258 ug/dL (ref 250–450)
UIBC: 173 ug/dL

## 2014-11-28 LAB — FERRITIN: FERRITIN: 713 ng/mL — AB (ref 11–307)

## 2014-11-28 MED ORDER — EPOETIN ALFA 10000 UNIT/ML IJ SOLN
INTRAMUSCULAR | Status: AC
  Administered 2014-11-28: 10000 [IU]
  Filled 2014-11-28: qty 1

## 2014-11-28 MED ORDER — EPOETIN ALFA 40000 UNIT/ML IJ SOLN: 30000.0000 [IU] | INTRAMUSCULAR | Status: DC

## 2014-11-28 MED ORDER — EPOETIN ALFA 20000 UNIT/ML IJ SOLN
INTRAMUSCULAR | Status: AC
  Administered 2014-11-28: 20000 [IU]
  Filled 2014-11-28: qty 1

## 2014-11-29 ENCOUNTER — Encounter (HOSPITAL_COMMUNITY): Payer: Commercial Managed Care - PPO

## 2014-12-10 ENCOUNTER — Telehealth: Payer: Self-pay

## 2014-12-10 MED ORDER — DEXTROSE 5 % IV SOLN: 1.5000 g | INTRAVENOUS | Status: DC

## 2014-12-10 NOTE — Telephone Encounter (Signed)
Holly [name] called to cancel surgery that is scheduled for tomorrow, 12/11/14, with Dr. Scot Dock. Patient states she is, "getting a second opinion just to make sure I'm doing the right thing". Instructed patient to call back with any further questions and/or if she decided to pursue surgery. [name] verbalized understanding.

## 2014-12-11 ENCOUNTER — Ambulatory Visit (HOSPITAL_COMMUNITY)
Admission: RE | Admit: 2014-12-11 | Payer: Commercial Managed Care - PPO | Source: Ambulatory Visit | Admitting: Vascular Surgery

## 2014-12-11 ENCOUNTER — Encounter (HOSPITAL_COMMUNITY): Admission: RE | Payer: Self-pay | Source: Ambulatory Visit

## 2014-12-11 SURGERY — ARTERIOVENOUS (AV) FISTULA CREATION
Anesthesia: Monitor Anesthesia Care | Laterality: Right

## 2014-12-18 ENCOUNTER — Encounter: Payer: Self-pay | Admitting: Podiatry

## 2014-12-18 ENCOUNTER — Ambulatory Visit (INDEPENDENT_AMBULATORY_CARE_PROVIDER_SITE_OTHER): Payer: Commercial Managed Care - PPO

## 2014-12-18 ENCOUNTER — Telehealth: Payer: Self-pay | Admitting: *Deleted

## 2014-12-18 ENCOUNTER — Ambulatory Visit (INDEPENDENT_AMBULATORY_CARE_PROVIDER_SITE_OTHER): Payer: Commercial Managed Care - PPO | Admitting: Podiatry

## 2014-12-18 VITALS — BP 131/71 | HR 84 | Temp 97.7°F | Resp 18

## 2014-12-18 DIAGNOSIS — R52 Pain, unspecified: Secondary | ICD-10-CM

## 2014-12-18 DIAGNOSIS — L97529 Non-pressure chronic ulcer of other part of left foot with unspecified severity: Secondary | ICD-10-CM

## 2014-12-18 DIAGNOSIS — L89891 Pressure ulcer of other site, stage 1: Secondary | ICD-10-CM

## 2014-12-18 DIAGNOSIS — E11621 Type 2 diabetes mellitus with foot ulcer: Secondary | ICD-10-CM

## 2014-12-18 DIAGNOSIS — E1149 Type 2 diabetes mellitus with other diabetic neurological complication: Secondary | ICD-10-CM

## 2014-12-18 DIAGNOSIS — L97519 Non-pressure chronic ulcer of other part of right foot with unspecified severity: Secondary | ICD-10-CM

## 2014-12-18 NOTE — Telephone Encounter (Signed)
Dr. Jacqualyn Posey ordered Collagen with silver to be applied daily to the 2 diabetic ulcers on pt's left sub 3rd - 2x2x0.2cm and right sub 3rd 2x2x0.3cm with low exudate.

## 2014-12-18 NOTE — Progress Notes (Signed)
   Subjective:    Patient ID: Holly [name], female    DOB: 08-22-1965, 49 y.o.   MRN: [id number]  HPI  49 year old female presents the office concerned ulcerations of both of her feet which then ongoing for several months. She states that she has previously been treated at [facility] for the wounds. She states she stopped going on her own but would like to get back to see a doctor for fear of amputation. She is diabetic and states her last A1c was 10. She denies any drainage coming from the wound denies any swelling redness or red streaks. No malodor. No other complaints at this time.   Review of Systems  All other systems reviewed and are negative.      Objective:   Physical Exam General: AAO x3, NAD  Dermatological: On bilateral submetatarsal 3 is granular ulcerations with a surrounding hyperkeratotic periwound. After debridement the wounds measured 2 x 2 x 0.2 in the left and 1.8 x 2 x 0.2 on the right. Neither the wounds probe to bone, undermining or tunneling. There is no swelling erythema, ascending cellulitis, fluctuance, crepitus, malodor, drainage/purulence. There is no conical signs of infection at this time.  Vascular: Dorsalis Pedis artery and Posterior Tibial artery pedal pulses are 2/4 bilateral with immedate capillary fill time. Pedal hair growth present. No varicosities and no lower extremity edema present bilateral. There is no pain with calf compression, swelling, warmth, erythema.   Neruologic: Sensation slightly decreased with Derrel Nip Monofilament, decreased vibratory sensation.  Musculoskeletal: There is prominent metatarsal heads plantarly with atrophy of the fat pad. No pain, crepitus, or limitation noted with foot and ankle range of motion bilateral. Muscular strength 5/5 in all groups tested bilateral.  Gait: Unassisted, Nonantalgic.      Assessment & Plan:  49 year old female with bilateral submetatarsal 3 chronic ulcerations. -Treatment  options discussed including all alternatives, risks, and complications -X-rays were obtained and reviewed with the patient.  -Treatment options discussed including all alternatives, risks, and complications -Went a debrided to healthy, granular wound base. Collagen with silver ordered through [facility].  -Offloading pads dispensed. Discussed offloading boot but at that time since she has bilateral wounds, this would likely be difficult for her. -Monitor for any clinical signs or symptoms of infection and directed to call the office immediately should any occur or go to the ER. -Follow-up in 2 weeks or sooner if any problems arise. In the meantime, encouraged to call the office with any questions, concerns, change in symptoms.   Celesta Gentile, DPM

## 2014-12-19 ENCOUNTER — Encounter (HOSPITAL_COMMUNITY): Payer: Commercial Managed Care - PPO

## 2014-12-24 DIAGNOSIS — E11621 Type 2 diabetes mellitus with foot ulcer: Secondary | ICD-10-CM | POA: Insufficient documentation

## 2014-12-24 DIAGNOSIS — L97519 Non-pressure chronic ulcer of other part of right foot with unspecified severity: Secondary | ICD-10-CM

## 2014-12-24 DIAGNOSIS — L97529 Non-pressure chronic ulcer of other part of left foot with unspecified severity: Secondary | ICD-10-CM

## 2014-12-24 DIAGNOSIS — E1149 Type 2 diabetes mellitus with other diabetic neurological complication: Secondary | ICD-10-CM | POA: Insufficient documentation

## 2014-12-28 ENCOUNTER — Encounter (HOSPITAL_COMMUNITY)
Admission: RE | Admit: 2014-12-28 | Discharge: 2014-12-28 | Disposition: A | Discharge status: Home / Self Care | Payer: Commercial Managed Care - PPO | Source: Ambulatory Visit | Attending: Nephrology | Admitting: Nephrology

## 2014-12-28 ENCOUNTER — Other Ambulatory Visit: Payer: Self-pay | Admitting: *Deleted

## 2014-12-28 DIAGNOSIS — N184 Chronic kidney disease, stage 4 (severe): Secondary | ICD-10-CM | POA: Insufficient documentation

## 2014-12-28 DIAGNOSIS — D631 Anemia in chronic kidney disease: Secondary | ICD-10-CM | POA: Insufficient documentation

## 2014-12-28 LAB — POCT HEMOGLOBIN-HEMACUE: Hemoglobin: 11.3 g/dL — ABNORMAL LOW (ref 12.0–15.0)

## 2014-12-28 MED ORDER — EPOETIN ALFA 10000 UNIT/ML IJ SOLN
INTRAMUSCULAR | Status: AC
  Administered 2014-12-28: 10000 [IU] via SUBCUTANEOUS
  Filled 2014-12-28: qty 1

## 2014-12-28 MED ORDER — EPOETIN ALFA 20000 UNIT/ML IJ SOLN
INTRAMUSCULAR | Status: AC
  Administered 2014-12-28: 20000 [IU] via SUBCUTANEOUS
  Filled 2014-12-28: qty 1

## 2014-12-28 MED ORDER — EPOETIN ALFA 40000 UNIT/ML IJ SOLN: 30000.0000 [IU] | INTRAMUSCULAR | Status: DC

## 2015-01-01 ENCOUNTER — Ambulatory Visit (INDEPENDENT_AMBULATORY_CARE_PROVIDER_SITE_OTHER): Payer: Commercial Managed Care - PPO | Admitting: Podiatry

## 2015-01-01 ENCOUNTER — Encounter: Payer: Self-pay | Admitting: Podiatry

## 2015-01-01 VITALS — BP 169/97 | HR 83 | Temp 98.3°F | Resp 16

## 2015-01-01 DIAGNOSIS — E1149 Type 2 diabetes mellitus with other diabetic neurological complication: Secondary | ICD-10-CM

## 2015-01-01 DIAGNOSIS — L89891 Pressure ulcer of other site, stage 1: Secondary | ICD-10-CM

## 2015-01-01 DIAGNOSIS — E11621 Type 2 diabetes mellitus with foot ulcer: Secondary | ICD-10-CM

## 2015-01-01 DIAGNOSIS — L97519 Non-pressure chronic ulcer of other part of right foot with unspecified severity: Secondary | ICD-10-CM

## 2015-01-01 DIAGNOSIS — L97529 Non-pressure chronic ulcer of other part of left foot with unspecified severity: Principal | ICD-10-CM

## 2015-01-02 NOTE — Progress Notes (Signed)
Patient ID: Holly [name], female   DOB: [birth year], 49 y.o.   MRN: [id number]  Subjective: 49 year old female presents the office they for follow-up evaluation of bilateral ulcerations to both of her feet. She states that she has been using the collagen with silver since last appointment she feels that they're healing nicely. She denies any drainage or pus. Denies any redness or drainage. She has continue with offloading pads around the area. She is inquiring about possible offloading shoe to help assist in healing. Denies any systemic complaints such as fevers, chills, nausea, vomiting. No calf pain, chest pain, shortness of breath. No other complaints at this time.  Objective: AAO 3, NAD DP/PT pulses 2/4, CRT less than 3 seconds Protective sensation absent Simms Weinstein monofilament On the plantar aspect of the foot there is annular hyperkeratotic lesions with ulceration in the central portion. On the left side submetatarsal 3 after debridement the wound measures 1.6 x 1.6 x 0.2 cm and on the right side measures 1.5 x 1.5 x 0.2 cm. Both wounds appear to be granular nature and clean. There is no surrounding erythema, ascending cellulitis, fluctuance, crepitus, malodor, drainage/purulence. There is prominent metatarsal heads plantarly with atrophy of the fat pad. No other open lesions or pre-ulcerative lesions. There is no pain with calf compression, swelling, warmth, erythema.    Assessment:  49 year old female bilateral submetatarsal 3  Ulcerations without signs of infection at this time.   Plan: -Treatment options discussed including all alternatives, risks, and complications - Wound sharply debrided 2 without complications to healthy, bleeding, granular wound base. Continued collagen silver dressing changes daily. Dispensed offloading shoe ( Darco shoe with heel)  To help take pressure off the foot. She can alternate the foot that she is using this on as she cannot use is  bilaterally. -Monitor for any clinical signs or symptoms of infection and directed to call the office immediately should any occur or go to the ER. -Follow-up in 4 weeks or sooner if any problems arise. In the meantime, encouraged to call the office with any questions, concerns, change in symptoms.  - I do recommend every couple weeks evaluation however should with the  Push it out to 4 weeks.  If there are any problems in meantime to call the office.  Celesta Gentile, DPM

## 2015-01-07 ENCOUNTER — Encounter (HOSPITAL_COMMUNITY): Payer: Self-pay | Admitting: *Deleted

## 2015-01-07 MED ORDER — DEXTROSE 5 % IV SOLN
1.5000 g | INTRAVENOUS | Status: AC
  Administered 2015-01-08: 1.5 g via INTRAVENOUS
  Filled 2015-01-07: qty 1.5

## 2015-01-07 NOTE — Progress Notes (Signed)
Pt denies cardiac history, chest pain or sob. States she's been told she had a slight heart murmur but it doesn't give any problems.  Pt is diabetic, states she does not check her blood sugars at home, last A1C in EPIC was 11.4 in March, 2016. Instructed pt to take 10 units of Levimir tonight and 6 units of Levimir in the AM. Pt voiced understanding.

## 2015-01-08 ENCOUNTER — Encounter (HOSPITAL_COMMUNITY)
Admission: RE | Disposition: A | Discharge status: Home / Self Care | Payer: Self-pay | Source: Ambulatory Visit | Attending: Vascular Surgery

## 2015-01-08 ENCOUNTER — Ambulatory Visit (HOSPITAL_COMMUNITY)
Admission: RE | Admit: 2015-01-08 | Discharge: 2015-01-08 | Disposition: A | Discharge status: Home / Self Care | Payer: Commercial Managed Care - PPO | Source: Ambulatory Visit | Attending: Vascular Surgery | Admitting: Vascular Surgery

## 2015-01-08 ENCOUNTER — Ambulatory Visit (HOSPITAL_COMMUNITY): Payer: Commercial Managed Care - PPO | Admitting: Anesthesiology

## 2015-01-08 ENCOUNTER — Telehealth: Payer: Self-pay | Admitting: Vascular Surgery

## 2015-01-08 ENCOUNTER — Encounter (HOSPITAL_COMMUNITY): Payer: Self-pay | Admitting: Certified Registered Nurse Anesthetist

## 2015-01-08 ENCOUNTER — Other Ambulatory Visit: Payer: Self-pay

## 2015-01-08 DIAGNOSIS — F419 Anxiety disorder, unspecified: Secondary | ICD-10-CM | POA: Diagnosis not present

## 2015-01-08 DIAGNOSIS — Z48812 Encounter for surgical aftercare following surgery on the circulatory system: Secondary | ICD-10-CM

## 2015-01-08 DIAGNOSIS — Z87442 Personal history of urinary calculi: Secondary | ICD-10-CM | POA: Diagnosis not present

## 2015-01-08 DIAGNOSIS — Z888 Allergy status to other drugs, medicaments and biological substances status: Secondary | ICD-10-CM | POA: Diagnosis not present

## 2015-01-08 DIAGNOSIS — G6181 Chronic inflammatory demyelinating polyneuritis: Secondary | ICD-10-CM | POA: Diagnosis not present

## 2015-01-08 DIAGNOSIS — E1165 Type 2 diabetes mellitus with hyperglycemia: Secondary | ICD-10-CM | POA: Insufficient documentation

## 2015-01-08 DIAGNOSIS — Z881 Allergy status to other antibiotic agents status: Secondary | ICD-10-CM | POA: Diagnosis not present

## 2015-01-08 DIAGNOSIS — E785 Hyperlipidemia, unspecified: Secondary | ICD-10-CM | POA: Insufficient documentation

## 2015-01-08 DIAGNOSIS — E1122 Type 2 diabetes mellitus with diabetic chronic kidney disease: Secondary | ICD-10-CM | POA: Insufficient documentation

## 2015-01-08 DIAGNOSIS — K219 Gastro-esophageal reflux disease without esophagitis: Secondary | ICD-10-CM | POA: Diagnosis not present

## 2015-01-08 DIAGNOSIS — I129 Hypertensive chronic kidney disease with stage 1 through stage 4 chronic kidney disease, or unspecified chronic kidney disease: Secondary | ICD-10-CM | POA: Diagnosis present

## 2015-01-08 DIAGNOSIS — F329 Major depressive disorder, single episode, unspecified: Secondary | ICD-10-CM | POA: Insufficient documentation

## 2015-01-08 DIAGNOSIS — N184 Chronic kidney disease, stage 4 (severe): Secondary | ICD-10-CM | POA: Insufficient documentation

## 2015-01-08 DIAGNOSIS — Z794 Long term (current) use of insulin: Secondary | ICD-10-CM | POA: Insufficient documentation

## 2015-01-08 DIAGNOSIS — G909 Disorder of the autonomic nervous system, unspecified: Secondary | ICD-10-CM | POA: Diagnosis not present

## 2015-01-08 DIAGNOSIS — K589 Irritable bowel syndrome without diarrhea: Secondary | ICD-10-CM | POA: Insufficient documentation

## 2015-01-08 DIAGNOSIS — Z882 Allergy status to sulfonamides status: Secondary | ICD-10-CM | POA: Insufficient documentation

## 2015-01-08 DIAGNOSIS — N186 End stage renal disease: Secondary | ICD-10-CM

## 2015-01-08 DIAGNOSIS — Z79899 Other long term (current) drug therapy: Secondary | ICD-10-CM | POA: Diagnosis not present

## 2015-01-08 HISTORY — DX: Other complications of anesthesia, initial encounter: T88.59XA

## 2015-01-08 HISTORY — DX: Adverse effect of unspecified anesthetic, initial encounter: T41.45XA

## 2015-01-08 HISTORY — DX: Cardiac murmur, unspecified: R01.1

## 2015-01-08 HISTORY — PX: AV FISTULA PLACEMENT: SHX1204

## 2015-01-08 LAB — SURGICAL PCR SCREEN
MRSA, PCR: NEGATIVE
Staphylococcus aureus: POSITIVE — AB

## 2015-01-08 LAB — POCT I-STAT 4, (NA,K, GLUC, HGB,HCT)
GLUCOSE: 202 mg/dL — AB (ref 65–99)
HCT: 37 % (ref 36.0–46.0)
HEMOGLOBIN: 12.6 g/dL (ref 12.0–15.0)
POTASSIUM: 4.6 mmol/L (ref 3.5–5.1)
SODIUM: 137 mmol/L (ref 135–145)

## 2015-01-08 LAB — GLUCOSE, CAPILLARY: Glucose-Capillary: 170 mg/dL — ABNORMAL HIGH (ref 65–99)

## 2015-01-08 SURGERY — ARTERIOVENOUS (AV) FISTULA CREATION
Anesthesia: Monitor Anesthesia Care | Site: Arm Upper | Laterality: Right

## 2015-01-08 MED ORDER — LIDOCAINE HCL (PF) 1 % IJ SOLN
INTRAMUSCULAR | Status: DC | PRN
  Administered 2015-01-08: 16 mL

## 2015-01-08 MED ORDER — MUPIROCIN 2 % EX OINT
1.0000 "application " | TOPICAL_OINTMENT | Freq: Once | CUTANEOUS | Status: AC
  Administered 2015-01-08: 1 via TOPICAL
  Filled 2015-01-08: qty 22

## 2015-01-08 MED ORDER — LIDOCAINE HCL (PF) 1 % IJ SOLN
INTRAMUSCULAR | Status: AC
  Filled 2015-01-08: qty 30

## 2015-01-08 MED ORDER — THROMBIN 20000 UNITS EX SOLR
CUTANEOUS | Status: AC
  Filled 2015-01-08: qty 20000

## 2015-01-08 MED ORDER — PROPOFOL 10 MG/ML IV BOLUS
INTRAVENOUS | Status: AC
  Filled 2015-01-08: qty 20

## 2015-01-08 MED ORDER — HEPARIN SODIUM (PORCINE) 1000 UNIT/ML IJ SOLN
INTRAMUSCULAR | Status: AC
Start: 2015-01-08 — End: 2015-01-08
  Filled 2015-01-08: qty 1

## 2015-01-08 MED ORDER — PROTAMINE SULFATE 10 MG/ML IV SOLN
INTRAVENOUS | Status: DC | PRN
  Administered 2015-01-08: 30 mg via INTRAVENOUS

## 2015-01-08 MED ORDER — PROTAMINE SULFATE 10 MG/ML IV SOLN
INTRAVENOUS | Status: AC
  Filled 2015-01-08: qty 5

## 2015-01-08 MED ORDER — 0.9 % SODIUM CHLORIDE (POUR BTL) OPTIME
TOPICAL | Status: DC | PRN
  Administered 2015-01-08: 1000 mL

## 2015-01-08 MED ORDER — HEPARIN SODIUM (PORCINE) 1000 UNIT/ML IJ SOLN
INTRAMUSCULAR | Status: DC | PRN
  Administered 2015-01-08: 6000 [IU] via INTRAVENOUS

## 2015-01-08 MED ORDER — OXYCODONE-ACETAMINOPHEN 5-325 MG PO TABS: 1.0000 | ORAL_TABLET | Freq: Four times a day (QID) | ORAL | Status: DC | PRN

## 2015-01-08 MED ORDER — SODIUM CHLORIDE 0.9 % IV SOLN
INTRAVENOUS | Status: DC | PRN
  Administered 2015-01-08: 08:00:00

## 2015-01-08 MED ORDER — SODIUM CHLORIDE 0.9 % IV SOLN
INTRAVENOUS | Status: DC | PRN
  Administered 2015-01-08: 07:00:00 via INTRAVENOUS

## 2015-01-08 MED ORDER — PROPOFOL 500 MG/50ML IV EMUL
INTRAVENOUS | Status: DC | PRN
  Administered 2015-01-08: 100 ug/kg/min via INTRAVENOUS
  Administered 2015-01-08: 08:00:00 via INTRAVENOUS

## 2015-01-08 MED ORDER — ONDANSETRON HCL 4 MG/2ML IJ SOLN: 4.0000 mg | Freq: Once | INTRAMUSCULAR | Status: DC | PRN

## 2015-01-08 MED ORDER — FENTANYL CITRATE (PF) 250 MCG/5ML IJ SOLN
INTRAMUSCULAR | Status: AC
  Filled 2015-01-08: qty 5

## 2015-01-08 MED ORDER — FENTANYL CITRATE (PF) 100 MCG/2ML IJ SOLN: 25.0000 ug | INTRAMUSCULAR | Status: DC | PRN

## 2015-01-08 MED ORDER — EPHEDRINE SULFATE 50 MG/ML IJ SOLN
INTRAMUSCULAR | Status: AC
  Filled 2015-01-08: qty 1

## 2015-01-08 MED ORDER — SODIUM CHLORIDE 0.9 % IV SOLN: INTRAVENOUS | Status: DC

## 2015-01-08 MED ORDER — CHLORHEXIDINE GLUCONATE CLOTH 2 % EX PADS: 6.0000 | MEDICATED_PAD | Freq: Once | CUTANEOUS | Status: DC

## 2015-01-08 MED ORDER — SODIUM CHLORIDE 0.9 % IJ SOLN
INTRAMUSCULAR | Status: AC
  Filled 2015-01-08: qty 10

## 2015-01-08 MED ORDER — LIDOCAINE HCL (CARDIAC) 20 MG/ML IV SOLN
INTRAVENOUS | Status: AC
  Filled 2015-01-08: qty 5

## 2015-01-08 SURGICAL SUPPLY — 31 items
ARMBAND PINK RESTRICT EXTREMIT (MISCELLANEOUS) ×2 IMPLANT
CANISTER SUCTION 2500CC (MISCELLANEOUS) ×2 IMPLANT
CANNULA VESSEL 3MM 2 BLNT TIP (CANNULA) ×2 IMPLANT
CLIP TI MEDIUM 6 (CLIP) ×2 IMPLANT
CLIP TI WIDE RED SMALL 24 (CLIP) ×2 IMPLANT
CLIP TI WIDE RED SMALL 6 (CLIP) ×2 IMPLANT
COVER PROBE W GEL 5X96 (DRAPES) IMPLANT
DECANTER SPIKE VIAL GLASS SM (MISCELLANEOUS) ×2 IMPLANT
ELECT REM PT RETURN 9FT ADLT (ELECTROSURGICAL) ×2
ELECTRODE REM PT RTRN 9FT ADLT (ELECTROSURGICAL) ×1 IMPLANT
GLOVE BIO SURGEON STRL SZ 6.5 (GLOVE) ×6 IMPLANT
GLOVE BIO SURGEON STRL SZ7.5 (GLOVE) ×2 IMPLANT
GLOVE BIOGEL PI IND STRL 6.5 (GLOVE) ×3 IMPLANT
GLOVE BIOGEL PI IND STRL 8 (GLOVE) ×2 IMPLANT
GLOVE BIOGEL PI INDICATOR 6.5 (GLOVE) ×3
GLOVE BIOGEL PI INDICATOR 8 (GLOVE) ×2
GOWN STRL REUS W/ TWL LRG LVL3 (GOWN DISPOSABLE) ×3 IMPLANT
GOWN STRL REUS W/TWL LRG LVL3 (GOWN DISPOSABLE) ×3
KIT BASIN OR (CUSTOM PROCEDURE TRAY) ×2 IMPLANT
KIT ROOM TURNOVER OR (KITS) ×2 IMPLANT
LIQUID BAND (GAUZE/BANDAGES/DRESSINGS) ×2 IMPLANT
NS IRRIG 1000ML POUR BTL (IV SOLUTION) ×2 IMPLANT
PACK CV ACCESS (CUSTOM PROCEDURE TRAY) ×2 IMPLANT
PAD ARMBOARD 7.5X6 YLW CONV (MISCELLANEOUS) ×4 IMPLANT
SPONGE SURGIFOAM ABS GEL 100 (HEMOSTASIS) IMPLANT
SUT PROLENE 6 0 BV (SUTURE) ×2 IMPLANT
SUT VIC AB 3-0 SH 27 (SUTURE) ×1
SUT VIC AB 3-0 SH 27X BRD (SUTURE) ×1 IMPLANT
SUT VICRYL 4-0 PS2 18IN ABS (SUTURE) ×2 IMPLANT
UNDERPAD 30X30 INCONTINENT (UNDERPADS AND DIAPERS) ×2 IMPLANT
WATER STERILE IRR 1000ML POUR (IV SOLUTION) ×2 IMPLANT

## 2015-01-08 NOTE — H&P (Signed)
[facility]ist [facility]  Patient name: Holly Varma DelzottoMRN: [id number] DOB: 02-15-1967Sex: female  REASON FOR CONSULT: evaluate for hemodialysis access.  HPI: Holly [name] is a 49 y.o. female, who is not yet on dialysis. She has chronic kidney disease secondary to hypertension and diabetes. She has had some fatigue related to her chronic kidney disease but denies any other uremic symptoms including nausea, vomiting, anorexia, or palpitations.  She does have wounds on both feet which she has had for some time. She developed a wound on her left foot in February 2015. She developed a wound on the right foot 6 months ago. These are both on the plantar aspect of her feet. She denies any fever or chills.  I have reviewed the records from [facility]. On 08/07/2014 or creatinine was 2.93. GFR was 18.  Past Medical History  Diagnosis Date  . Bulimia   . Acute cystitis   . Anxiety state, unspecified   . Essential hypertension, benign   . 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 Dysthymic disorder   . Peripheral autonomic neuropathy in disorders classified elsewhere(337.1)   . Diarrhea     symptomatic  . Edema     moderate  . Esophageal reflux   . HLD (hyperlipidemia)     HDL goal >50, LDL goal <100  . Unspecified essential hypertension     Renal Dopplers 12/23/09 at [facility] cardiology in [facility]: No significant renal artery stenosis bilaterally  . Disorders of magnesium metabolism   . Irritable bowel syndrome   . Left heart failure ([place])   . Muscle weakness (generalized)   . Dysuria   . Palpitations   . Primary pulmonary hypertension (HCC)     not seen at CATH (32mm Hg)  . Neuralgia, neuritis, and radiculitis, unspecified   . Encounter for long-term (current) use of other medications   . Pneumonia 2010  . DM2  (diabetes mellitus, type 2) ([place])   . Type II or unspecified type diabetes mellitus with other specified manifestations, not stated as uncontrolled     Cardiac catheterization 7/10 at [facility] cardiology in [facility]: Normal coronary arteries  . Background diabetic retinopathy(362.01)   . History of blood transfusion X 6-7; 2010 - present (03/29/2014)    "related to OR; kidney issues"  . Iron deficiency anemia     "suppose to get procrit injections q 3 wks; usually don't" do it" (03/29/2014)  . Calculus of kidney   . Chronic kidney disease, stage IV (severe) (HCC)    Family History  Problem Relation Age of Onset  . Hypertension Mother   . Myelodysplastic syndrome Father   . Pancreatic cancer     SOCIAL HISTORY: Social History   Social History  . Marital Status: Single    Spouse Name: N/A  . Number of Children: 4  . Years of Education: N/A   Occupational History  . collections    Social History Main Topics  . Smoking status: Never Smoker   . Smokeless tobacco: Never Used  . Alcohol Use: Yes     Comment: 03/29/2014 "might have a drink a couple times/yr"  . Drug Use: No  . Sexual Activity: Yes   Other Topics Concern  . Not on file   Social History Narrative   Allergies  Allergen Reactions  . Bactrim Nausea And Vomiting  . Byetta 10 Mcg Pen [Exenatide] Nausea And Vomiting    N and V  . Hctz [Hydrochlorothiazide]  dizziness  . Spironolactone     Dizziness   . Sulfa Antibiotics Nausea And Vomiting  . Tekturna [Aliskiren Fumarate] Rash    Patient has no recollection of taking this medicine or of a medication giving her a rash   Current Outpatient Prescriptions  Medication Sig Dispense Refill  . amLODipine (NORVASC) 5 MG tablet Take 5 mg by mouth daily.    . benazepril (LOTENSIN) 40 MG tablet Take 1 tablet (40 mg total) by  mouth daily. 30 tablet 12  . calcitRIOL (ROCALTROL) 0.25 MCG capsule Take 0.25 mcg by mouth every other day.    . calcium acetate (PHOSLO) 667 MG capsule Take 667 mg by mouth 2 (two) times daily.    Marland Kitchen Carbonyl Iron (PERFECT IRON PO) Take 1 tablet by mouth daily.    . cloNIDine (CATAPRES) 0.1 MG tablet Take 0.1 mg by mouth 2 (two) times daily.    Marland Kitchen FLUoxetine (PROZAC) 20 MG capsule Take 20 mg by mouth every morning.     . furosemide (LASIX) 40 MG tablet Take 40 mg by mouth daily.  6  . glucose blood (ONETOUCH VERIO) test strip 1 each by Other route 2 (two) times daily. And lancets 2/day 250/.61 100 each 12  . insulin detemir (LEVEMIR) 100 UNIT/ML injection Inject 13 Units into the skin 2 (two) times daily.     Marland Kitchen linagliptin (TRADJENTA) 5 MG TABS tablet Take 5 mg by mouth daily.    . sodium bicarbonate 650 MG tablet Take 1,300 mg by mouth 2 (two) times daily.    . carvedilol (COREG) 25 MG tablet Take 25 mg by mouth 2 (two) times daily with a meal.     Current Facility-Administered Medications  Medication Dose Route Frequency Provider Last Rate Last Dose  . cloNIDine (CATAPRES) tablet 0.1 mg 0.1 mg Oral Once Deboraha Sprang, MD     REVIEW OF SYSTEMS:  [X]  denotes positive finding, [ ]  denotes negative finding Cardiac  Comments:  Chest pain or chest pressure:    Shortness of breath upon exertion:    Short of breath when lying flat:    Irregular heart rhythm:        Vascular    Pain in calf, thigh, or hip brought on by ambulation:    Pain in feet at night that wakes you up from your sleep:     Blood clot in your veins:    Leg swelling:  X       Pulmonary    Oxygen at home:    Productive cough:     Wheezing:         Neurologic    Sudden weakness in arms or legs:     Sudden numbness in arms or legs:     Sudden onset of difficulty speaking or  slurred speech:    Temporary loss of vision in one eye:     Problems with dizziness:         Gastrointestinal    Blood in stool:     Vomited blood:         Genitourinary    Burning when urinating:     Blood in urine:        Psychiatric    Major depression:         Hematologic    Bleeding problems:    Problems with blood clotting too easily:        Skin    Rashes or ulcers: X  Constitutional    Fever or chills:      PHYSICAL EXAM: Filed Vitals:   11/07/14 1241 11/07/14 1244  BP: 180/99 198/99  Pulse: 82 84  Temp: 97.8 F (36.6 C)   TempSrc: Oral   Resp: 16   Height: 5\' 7"  (1.702 m)   Weight: 172 lb (78.019 kg)   SpO2: 100%    GENERAL: The patient is a well-nourished female, in no acute distress. The vital signs are documented above. CARDIAC: There is a regular rate and rhythm.  VASCULAR: I do not detect carotid bruits. She has a palpable right radial pulse and a slightly diminished left radial pulse. She has palpable femoral, popliteal, dorsalis pedis, and posterior tibial pulses bilaterally. PULMONARY: There is good air exchange bilaterally without wheezing or rales. ABDOMEN: Soft and non-tender with normal pitched bowel sounds.  MUSCULOSKELETAL: There are no major deformities or cyanosis. NEUROLOGIC: No focal weakness or paresthesias are detected. SKIN: she has a wound on her right foot on the plantar aspect that measures 10 mm x 10 mm. This is a diabetic ulcer. She has a wound on her left foot which measures 20 mm x 15 mm. This is also a diabetic ulcer. PSYCHIATRIC: The patient has a normal affect.  DATA:  I have independently interpreted her vein mapping today. She does not appear to have adequate vein in the left arm for a fistula. On the right side the forearm cephalic vein and upper arm cephalic vein looked potentially usable for fistula. The basilic  vein looks quite small.  I have independently interpreted her arterial Doppler study today which shows triphasic waveforms in the radial and ulnar positions bilaterally.  MEDICAL ISSUES:  STAGE IV CHRONIC KIDNEY DISEASE: She appears to be a candidate for an AV fistula in the right arm. Even a sheath right-handed I do not see any usable veins in the left arm. Given that she has wounds on both feet I would not recommend placing an AV graft at this time. However she's an stage IV chronic kidney disease and we would likely not place a graft anyway unless her kidney function continued to deteriorate. She is scheduled for placement of a right AV fistula on 11/27/2014. I have explained the indications for placement of an AV fistula or AV graft. I've explained that if at all possible we will place an AV fistula. I have reviewed the risks of placement of an AV fistula including but not limited to: failure of the fistula to mature, need for subsequent interventions, and thrombosis. In addition I have reviewed the potential complications of placement of an AV graft. These risks include, but are not limited to, graft thrombosis, graft infection, wound healing problems, bleeding, arm swelling, and steal syndrome. All the patient's questions were answered and they are agreeable to proceed with surgery.   HYPERTENSION: The patient's initial blood pressure today was elevated. We repeated this and this was still elevated. We have encouraged the patient to follow up with their primary care physician for management of their blood pressure.  Deitra Mayo [facility] [place]: [phone]

## 2015-01-08 NOTE — Op Note (Signed)
    NAME: Holly [name]   MRN: [id number] DOB: January 07, 1966    DATE OF OPERATION: 01/08/2015  PREOP DIAGNOSIS: Stage IV chronic kidney disease  POSTOP DIAGNOSIS: Same  PROCEDURE: Right brachiocephalic AV fistula  SURGEON: Judeth Cornfield. Scot Dock, MD, FACS  ASSIST: Silva Bandy, [facility]  ANESTHESIA: Local with sedation   EBL: minimal  INDICATIONS: [name] is a 49 y.o. female who presents for new access. She is not yet on dialysis.  FINDINGS: 4 mm cephalic vein.  TECHNIQUE: The patient was taken to the operating room and sedated by anesthesia. The right upper extremity was prepped and draped in the usual sterile fashion. After the skin was anesthetized with 1% lidocaine, a transverse incision was made above the antecubital level. Here the cephalic vein was dissected free and ligated distally. It irrigated nicely with heparinized saline. The brachial artery was dissected free beneath the fascia. The patient was heparinized. The brachial artery was clamped proximally and distally and a longitudinal arteriotomy was made. The vein was sewn end-to-side to the artery using continuous 6-0 Prolene suture. At the completion was a good thrill in the fistula. There was a radial and ulnar signal with the Doppler. The heparin was partially reversed with protamine. The wound was closed with a deep layer 3-0 Vicryl and the skin closed with 4-0 Vicryl. Liquid band was applied. The patient tolerated the procedure well and was transferred to the recovery room in stable condition. All needle and sponge counts were correct.  Deitra Mayo, MD, FACS [facility]  DATE OF DICTATION:   01/08/2015

## 2015-01-08 NOTE — Progress Notes (Signed)
Patient states she cannot take pain medication due to a previous addiction. She refuses to take home RX. RX shredded and witnessed by Dina Rich RN

## 2015-01-08 NOTE — Telephone Encounter (Signed)
Spoke with pt re appt, dpm  °

## 2015-01-08 NOTE — Anesthesia Procedure Notes (Signed)
Procedure Name: MAC Date/Time: 01/08/2015 7:38 AM Performed by: Willeen Cass P Pre-anesthesia Checklist: Patient identified, Timeout performed, Emergency Drugs available, Suction available and Patient being monitored Patient Re-evaluated:Patient Re-evaluated prior to inductionOxygen Delivery Method: Simple face mask Intubation Type: IV induction Dental Injury: Teeth and Oropharynx as per pre-operative assessment

## 2015-01-08 NOTE — Telephone Encounter (Signed)
-----   Message from Denman George, RN sent at 01/08/2015  9:38 AM EST ----- Regarding: needs 6 wk. f/u with CSD with access duplex   ----- Message -----    From: Angelia Mould, MD    Sent: 01/08/2015   8:54 AM      To: Vvs Charge Pool Subject: charge                                         PROCEDURE: Right brachiocephalic AV fistula  SURGEON: Judeth Cornfield. Scot Dock, MD, FACS  ASSIST: Silva Bandy, [facility]  She will need a follow up visit in 6 weeks to check on the maturation of her fistula. She will need a duplex at that time. Thank you. CD

## 2015-01-08 NOTE — Anesthesia Preprocedure Evaluation (Signed)
Anesthesia Evaluation  Patient identified by MRN, date of birth, ID band Patient awake    Reviewed: Allergy & Precautions, H&P , NPO status , Patient's Chart, lab work & pertinent test results  History of Anesthesia Complications Negative for: history of anesthetic complications  Airway Mallampati: II  TM Distance: >3 FB Neck ROM: full    Dental no notable dental hx.    Pulmonary neg pulmonary ROS,    Pulmonary exam normal breath sounds clear to auscultation       Cardiovascular hypertension, Pt. on medications Normal cardiovascular exam Rhythm:regular Rate:Normal     Neuro/Psych PSYCHIATRIC DISORDERS Anxiety Depression  Neuromuscular disease    GI/Hepatic Neg liver ROS, GERD  ,  Endo/Other  diabetes, Poorly Controlled  Renal/GU ESRF and DialysisRenal disease     Musculoskeletal   Abdominal   Peds  Hematology  (+) anemia ,   Anesthesia Other Findings   Reproductive/Obstetrics negative OB ROS                             Anesthesia Physical Anesthesia Plan  ASA: III  Anesthesia Plan: MAC   Post-op Pain Management:    Induction: Intravenous  Airway Management Planned: Simple Face Mask  Additional Equipment:   Intra-op Plan:   Post-operative Plan:   Informed Consent: I have reviewed the patients History and Physical, chart, labs and discussed the procedure including the risks, benefits and alternatives for the proposed anesthesia with the patient or authorized representative who has indicated his/her understanding and acceptance.   Dental Advisory Given  Plan Discussed with: Anesthesiologist, CRNA and Surgeon  Anesthesia Plan Comments:         Anesthesia Quick Evaluation

## 2015-01-08 NOTE — Transfer of Care (Signed)
Immediate Anesthesia Transfer of Care Note  Patient: Holly [name]  Procedure(s) Performed: Procedure(s): ARTERIOVENOUS (AV) FISTULA CREATION (Right)  Patient Location: PACU  Anesthesia Type:MAC  Level of Consciousness: awake, patient cooperative and lethargic  Airway & Oxygen Therapy: Patient Spontanous Breathing and Patient connected to face mask oxygen  Post-op Assessment: Report given to RN and Post -op Vital signs reviewed and stable  Post vital signs: Reviewed and stable  Last Vitals:  BP 124/70 HR 68 RR 12 SpO2 100 % Resting comfortably, maintains good airway, denies pain.   Complications: No apparent anesthesia complications

## 2015-01-08 NOTE — Anesthesia Postprocedure Evaluation (Signed)
Anesthesia Post Note  Patient: Holly [name]  Procedure(s) Performed: Procedure(s) (LRB): ARTERIOVENOUS (AV) FISTULA CREATION (Right)  Patient location during evaluation: PACU Anesthesia Type: MAC Level of consciousness: awake and alert Pain management: pain level controlled Vital Signs Assessment: post-procedure vital signs reviewed and stable Respiratory status: spontaneous breathing, nonlabored ventilation, respiratory function stable and patient connected to nasal cannula oxygen Cardiovascular status: stable and blood pressure returned to baseline Anesthetic complications: no    Last Vitals:  Filed Vitals:   01/08/15 0857 01/08/15 0913  BP: 124/70 150/82  Pulse: 66 72  Temp:    Resp: 13 11    Last Pain: There were no vitals filed for this visit.               Zenaida Deed

## 2015-01-08 NOTE — Discharge Instructions (Signed)
° ° °  01/08/2015 Holly [name] [id number] [birth year]  Surgeon(s): Angelia Mould, MD  Procedure(s): ARTERIOVENOUS (AV) FISTULA CREATION  x Do not stick graft for 12 weeks

## 2015-01-09 ENCOUNTER — Encounter (HOSPITAL_COMMUNITY): Payer: Self-pay | Admitting: Vascular Surgery

## 2015-01-18 ENCOUNTER — Other Ambulatory Visit (HOSPITAL_COMMUNITY): Payer: Self-pay | Admitting: *Deleted

## 2015-01-22 ENCOUNTER — Encounter (HOSPITAL_COMMUNITY)
Admission: RE | Admit: 2015-01-22 | Discharge: 2015-01-22 | Disposition: A | Discharge status: Home / Self Care | Payer: Commercial Managed Care - PPO | Source: Ambulatory Visit | Attending: Nephrology | Admitting: Nephrology

## 2015-01-22 DIAGNOSIS — D631 Anemia in chronic kidney disease: Secondary | ICD-10-CM | POA: Diagnosis not present

## 2015-01-22 LAB — FERRITIN: FERRITIN: 650 ng/mL — AB (ref 11–307)

## 2015-01-22 LAB — IRON AND TIBC
Iron: 15 ug/dL — ABNORMAL LOW (ref 28–170)
Saturation Ratios: 7 % — ABNORMAL LOW (ref 10.4–31.8)
TIBC: 206 ug/dL — AB (ref 250–450)
UIBC: 191 ug/dL

## 2015-01-22 LAB — POCT HEMOGLOBIN-HEMACUE: HEMOGLOBIN: 11.3 g/dL — AB (ref 12.0–15.0)

## 2015-01-22 MED ORDER — EPOETIN ALFA 20000 UNIT/ML IJ SOLN
INTRAMUSCULAR | Status: AC
  Administered 2015-01-22: 20000 [IU] via SUBCUTANEOUS
  Filled 2015-01-22: qty 1

## 2015-01-22 MED ORDER — EPOETIN ALFA 40000 UNIT/ML IJ SOLN: 30000.0000 [IU] | INTRAMUSCULAR | Status: DC

## 2015-01-22 MED ORDER — EPOETIN ALFA 10000 UNIT/ML IJ SOLN
INTRAMUSCULAR | Status: AC
  Administered 2015-01-22: 10000 [IU] via SUBCUTANEOUS
  Filled 2015-01-22: qty 1

## 2015-01-23 ENCOUNTER — Telehealth: Payer: Self-pay | Admitting: Podiatry

## 2015-01-23 NOTE — Telephone Encounter (Signed)
Pt. Called left foot is swollen, pt offered appt. 12/29@ 8:45am , she said she has to work. Shes just going to wait to see if it gets better., if not she will call back

## 2015-01-24 ENCOUNTER — Emergency Department (HOSPITAL_COMMUNITY): Payer: Commercial Managed Care - PPO

## 2015-01-24 ENCOUNTER — Encounter (HOSPITAL_COMMUNITY): Payer: Self-pay | Admitting: Cardiology

## 2015-01-24 ENCOUNTER — Inpatient Hospital Stay (HOSPITAL_COMMUNITY)
Admission: EM | Admit: 2015-01-24 | Discharge: 2015-02-03 | DRG: 854 | Disposition: A | Discharge status: Home Health | Payer: Commercial Managed Care - PPO | Attending: Internal Medicine | Admitting: Internal Medicine

## 2015-01-24 DIAGNOSIS — I152 Hypertension secondary to endocrine disorders: Secondary | ICD-10-CM | POA: Diagnosis present

## 2015-01-24 DIAGNOSIS — R339 Retention of urine, unspecified: Secondary | ICD-10-CM | POA: Diagnosis present

## 2015-01-24 DIAGNOSIS — N179 Acute kidney failure, unspecified: Secondary | ICD-10-CM | POA: Diagnosis present

## 2015-01-24 DIAGNOSIS — L02612 Cutaneous abscess of left foot: Secondary | ICD-10-CM

## 2015-01-24 DIAGNOSIS — I10 Essential (primary) hypertension: Secondary | ICD-10-CM

## 2015-01-24 DIAGNOSIS — E11621 Type 2 diabetes mellitus with foot ulcer: Secondary | ICD-10-CM | POA: Diagnosis not present

## 2015-01-24 DIAGNOSIS — Z79899 Other long term (current) drug therapy: Secondary | ICD-10-CM | POA: Diagnosis not present

## 2015-01-24 DIAGNOSIS — M868X7 Other osteomyelitis, ankle and foot: Secondary | ICD-10-CM | POA: Diagnosis present

## 2015-01-24 DIAGNOSIS — G6181 Chronic inflammatory demyelinating polyneuritis: Secondary | ICD-10-CM | POA: Diagnosis present

## 2015-01-24 DIAGNOSIS — L97529 Non-pressure chronic ulcer of other part of left foot with unspecified severity: Secondary | ICD-10-CM | POA: Diagnosis present

## 2015-01-24 DIAGNOSIS — N390 Urinary tract infection, site not specified: Secondary | ICD-10-CM

## 2015-01-24 DIAGNOSIS — E1159 Type 2 diabetes mellitus with other circulatory complications: Secondary | ICD-10-CM | POA: Diagnosis present

## 2015-01-24 DIAGNOSIS — M79672 Pain in left foot: Secondary | ICD-10-CM | POA: Diagnosis not present

## 2015-01-24 DIAGNOSIS — I270 Primary pulmonary hypertension: Secondary | ICD-10-CM | POA: Diagnosis present

## 2015-01-24 DIAGNOSIS — N319 Neuromuscular dysfunction of bladder, unspecified: Secondary | ICD-10-CM | POA: Diagnosis present

## 2015-01-24 DIAGNOSIS — L03116 Cellulitis of left lower limb: Secondary | ICD-10-CM | POA: Diagnosis present

## 2015-01-24 DIAGNOSIS — L97509 Non-pressure chronic ulcer of other part of unspecified foot with unspecified severity: Secondary | ICD-10-CM

## 2015-01-24 DIAGNOSIS — E11319 Type 2 diabetes mellitus with unspecified diabetic retinopathy without macular edema: Secondary | ICD-10-CM | POA: Diagnosis present

## 2015-01-24 DIAGNOSIS — E1151 Type 2 diabetes mellitus with diabetic peripheral angiopathy without gangrene: Secondary | ICD-10-CM | POA: Diagnosis present

## 2015-01-24 DIAGNOSIS — R600 Localized edema: Secondary | ICD-10-CM

## 2015-01-24 DIAGNOSIS — T383X6A Underdosing of insulin and oral hypoglycemic [antidiabetic] drugs, initial encounter: Secondary | ICD-10-CM | POA: Diagnosis present

## 2015-01-24 DIAGNOSIS — A419 Sepsis, unspecified organism: Principal | ICD-10-CM | POA: Diagnosis present

## 2015-01-24 DIAGNOSIS — E1169 Type 2 diabetes mellitus with other specified complication: Secondary | ICD-10-CM | POA: Diagnosis present

## 2015-01-24 DIAGNOSIS — N2581 Secondary hyperparathyroidism of renal origin: Secondary | ICD-10-CM | POA: Diagnosis present

## 2015-01-24 DIAGNOSIS — E872 Acidosis: Secondary | ICD-10-CM | POA: Diagnosis present

## 2015-01-24 DIAGNOSIS — I132 Hypertensive heart and chronic kidney disease with heart failure and with stage 5 chronic kidney disease, or end stage renal disease: Secondary | ICD-10-CM | POA: Diagnosis present

## 2015-01-24 DIAGNOSIS — Z794 Long term (current) use of insulin: Secondary | ICD-10-CM

## 2015-01-24 DIAGNOSIS — I5032 Chronic diastolic (congestive) heart failure: Secondary | ICD-10-CM | POA: Diagnosis present

## 2015-01-24 DIAGNOSIS — N185 Chronic kidney disease, stage 5: Secondary | ICD-10-CM | POA: Diagnosis present

## 2015-01-24 DIAGNOSIS — E1140 Type 2 diabetes mellitus with diabetic neuropathy, unspecified: Secondary | ICD-10-CM | POA: Diagnosis present

## 2015-01-24 DIAGNOSIS — E1101 Type 2 diabetes mellitus with hyperosmolarity with coma: Secondary | ICD-10-CM | POA: Diagnosis not present

## 2015-01-24 DIAGNOSIS — L298 Other pruritus: Secondary | ICD-10-CM | POA: Diagnosis not present

## 2015-01-24 DIAGNOSIS — N189 Chronic kidney disease, unspecified: Secondary | ICD-10-CM | POA: Diagnosis not present

## 2015-01-24 DIAGNOSIS — E860 Dehydration: Secondary | ICD-10-CM | POA: Diagnosis present

## 2015-01-24 DIAGNOSIS — D509 Iron deficiency anemia, unspecified: Secondary | ICD-10-CM | POA: Diagnosis present

## 2015-01-24 DIAGNOSIS — L02619 Cutaneous abscess of unspecified foot: Secondary | ICD-10-CM

## 2015-01-24 DIAGNOSIS — E1165 Type 2 diabetes mellitus with hyperglycemia: Secondary | ICD-10-CM | POA: Diagnosis present

## 2015-01-24 DIAGNOSIS — D631 Anemia in chronic kidney disease: Secondary | ICD-10-CM | POA: Diagnosis present

## 2015-01-24 DIAGNOSIS — T402X5A Adverse effect of other opioids, initial encounter: Secondary | ICD-10-CM | POA: Diagnosis not present

## 2015-01-24 DIAGNOSIS — N184 Chronic kidney disease, stage 4 (severe): Secondary | ICD-10-CM

## 2015-01-24 DIAGNOSIS — D6959 Other secondary thrombocytopenia: Secondary | ICD-10-CM | POA: Diagnosis present

## 2015-01-24 DIAGNOSIS — L03119 Cellulitis of unspecified part of limb: Secondary | ICD-10-CM

## 2015-01-24 DIAGNOSIS — Z91128 Patient's intentional underdosing of medication regimen for other reason: Secondary | ICD-10-CM | POA: Diagnosis not present

## 2015-01-24 DIAGNOSIS — IMO0002 Reserved for concepts with insufficient information to code with codable children: Secondary | ICD-10-CM | POA: Diagnosis present

## 2015-01-24 DIAGNOSIS — G629 Polyneuropathy, unspecified: Secondary | ICD-10-CM

## 2015-01-24 DIAGNOSIS — R0902 Hypoxemia: Secondary | ICD-10-CM

## 2015-01-24 DIAGNOSIS — E1122 Type 2 diabetes mellitus with diabetic chronic kidney disease: Secondary | ICD-10-CM | POA: Diagnosis present

## 2015-01-24 DIAGNOSIS — E1121 Type 2 diabetes mellitus with diabetic nephropathy: Secondary | ICD-10-CM | POA: Diagnosis not present

## 2015-01-24 DIAGNOSIS — W19XXXA Unspecified fall, initial encounter: Secondary | ICD-10-CM

## 2015-01-24 DIAGNOSIS — D696 Thrombocytopenia, unspecified: Secondary | ICD-10-CM | POA: Diagnosis present

## 2015-01-24 DIAGNOSIS — L97522 Non-pressure chronic ulcer of other part of left foot with fat layer exposed: Secondary | ICD-10-CM

## 2015-01-24 HISTORY — DX: Cellulitis of unspecified part of limb: L03.119

## 2015-01-24 HISTORY — DX: Cutaneous abscess of unspecified foot: L02.619

## 2015-01-24 LAB — GLUCOSE, CAPILLARY
GLUCOSE-CAPILLARY: 208 mg/dL — AB (ref 65–99)
Glucose-Capillary: 202 mg/dL — ABNORMAL HIGH (ref 65–99)

## 2015-01-24 LAB — CBC WITH DIFFERENTIAL/PLATELET
BASOS ABS: 0 10*3/uL (ref 0.0–0.1)
Basophils Relative: 0 %
EOS ABS: 0 10*3/uL (ref 0.0–0.7)
Eosinophils Relative: 0 %
HCT: 48.9 % — ABNORMAL HIGH (ref 36.0–46.0)
HEMOGLOBIN: 16.3 g/dL — AB (ref 12.0–15.0)
LYMPHS ABS: 0.4 10*3/uL — AB (ref 0.7–4.0)
LYMPHS PCT: 4 %
MCH: 30.3 pg (ref 26.0–34.0)
MCHC: 33.3 g/dL (ref 30.0–36.0)
MCV: 90.9 fL (ref 78.0–100.0)
Monocytes Absolute: 0.8 10*3/uL (ref 0.1–1.0)
Monocytes Relative: 7 %
NEUTROS PCT: 89 %
Neutro Abs: 9.5 10*3/uL — ABNORMAL HIGH (ref 1.7–7.7)
Platelets: 126 10*3/uL — ABNORMAL LOW (ref 150–400)
RBC: 5.38 MIL/uL — AB (ref 3.87–5.11)
RDW: 13.1 % (ref 11.5–15.5)
WBC: 10.7 10*3/uL — AB (ref 4.0–10.5)

## 2015-01-24 LAB — COMPREHENSIVE METABOLIC PANEL
ALK PHOS: 78 U/L (ref 38–126)
ALT: 12 U/L — AB (ref 14–54)
AST: 12 U/L — AB (ref 15–41)
Albumin: 2.9 g/dL — ABNORMAL LOW (ref 3.5–5.0)
Anion gap: 15 (ref 5–15)
BUN: 71 mg/dL — ABNORMAL HIGH (ref 6–20)
CO2: 22 mmol/L (ref 22–32)
Calcium: 9.3 mg/dL (ref 8.9–10.3)
Chloride: 95 mmol/L — ABNORMAL LOW (ref 101–111)
Creatinine, Ser: 4.34 mg/dL — ABNORMAL HIGH (ref 0.44–1.00)
GFR calc non Af Amer: 11 mL/min — ABNORMAL LOW (ref >60)
GFR, EST AFRICAN AMERICAN: 13 mL/min — AB (ref >60)
Glucose, Bld: 287 mg/dL — ABNORMAL HIGH (ref 65–99)
Potassium: 4.1 mmol/L (ref 3.5–5.1)
SODIUM: 132 mmol/L — AB (ref 135–145)
Total Bilirubin: 0.9 mg/dL (ref 0.3–1.2)
Total Protein: 6.8 g/dL (ref 6.5–8.1)

## 2015-01-24 LAB — CBG MONITORING, ED: GLUCOSE-CAPILLARY: 259 mg/dL — AB (ref 65–99)

## 2015-01-24 MED ORDER — ONDANSETRON HCL 4 MG/2ML IJ SOLN: 4.0000 mg | Freq: Three times a day (TID) | INTRAMUSCULAR | Status: DC | PRN

## 2015-01-24 MED ORDER — VANCOMYCIN HCL IN DEXTROSE 1-5 GM/200ML-% IV SOLN
1000.0000 mg | INTRAVENOUS | Status: DC
  Administered 2015-01-26: 1000 mg via INTRAVENOUS
  Filled 2015-01-24 (×2): qty 200

## 2015-01-24 MED ORDER — MORPHINE SULFATE (PF) 4 MG/ML IV SOLN
4.0000 mg | Freq: Once | INTRAVENOUS | Status: AC
  Administered 2015-01-24: 4 mg via INTRAVENOUS
  Filled 2015-01-24: qty 1

## 2015-01-24 MED ORDER — ACETAMINOPHEN 325 MG PO TABS
650.0000 mg | ORAL_TABLET | Freq: Four times a day (QID) | ORAL | Status: DC | PRN
  Administered 2015-01-25: 650 mg via ORAL
  Filled 2015-01-24: qty 2

## 2015-01-24 MED ORDER — ONDANSETRON HCL 4 MG/2ML IJ SOLN
4.0000 mg | Freq: Once | INTRAMUSCULAR | Status: AC
  Administered 2015-01-24: 4 mg via INTRAVENOUS
  Filled 2015-01-24: qty 2

## 2015-01-24 MED ORDER — HYDROMORPHONE HCL 1 MG/ML IJ SOLN
0.5000 mg | INTRAMUSCULAR | Status: DC | PRN
  Administered 2015-01-25 (×2): 1 mg via INTRAVENOUS
  Filled 2015-01-24 (×2): qty 1

## 2015-01-24 MED ORDER — CLONIDINE HCL 0.1 MG PO TABS
0.1000 mg | ORAL_TABLET | Freq: Once | ORAL | Status: AC
  Administered 2015-01-24: 0.1 mg via ORAL
  Filled 2015-01-24: qty 1

## 2015-01-24 MED ORDER — INSULIN ASPART 100 UNIT/ML ~~LOC~~ SOLN
0.0000 [IU] | Freq: Every day | SUBCUTANEOUS | Status: DC
  Administered 2015-01-24 – 2015-01-30 (×3): 2 [IU] via SUBCUTANEOUS

## 2015-01-24 MED ORDER — ONDANSETRON HCL 4 MG/2ML IJ SOLN: 4.0000 mg | Freq: Four times a day (QID) | INTRAMUSCULAR | Status: DC | PRN

## 2015-01-24 MED ORDER — ONDANSETRON HCL 4 MG PO TABS: 4.0000 mg | ORAL_TABLET | Freq: Four times a day (QID) | ORAL | Status: DC | PRN

## 2015-01-24 MED ORDER — SODIUM BICARBONATE 650 MG PO TABS
1300.0000 mg | ORAL_TABLET | Freq: Two times a day (BID) | ORAL | Status: DC
  Administered 2015-01-24 – 2015-02-03 (×19): 1300 mg via ORAL
  Filled 2015-01-24 (×22): qty 2

## 2015-01-24 MED ORDER — ACETAMINOPHEN 650 MG RE SUPP
650.0000 mg | Freq: Four times a day (QID) | RECTAL | Status: DC | PRN
  Administered 2015-01-25: 650 mg via RECTAL
  Filled 2015-01-24: qty 1

## 2015-01-24 MED ORDER — FELODIPINE ER 5 MG PO TB24
5.0000 mg | ORAL_TABLET | Freq: Every day | ORAL | Status: DC
  Administered 2015-01-24 – 2015-01-25 (×2): 5 mg via ORAL
  Filled 2015-01-24 (×3): qty 1

## 2015-01-24 MED ORDER — LINAGLIPTIN 5 MG PO TABS
5.0000 mg | ORAL_TABLET | Freq: Every day | ORAL | Status: DC
  Administered 2015-01-24 – 2015-01-25 (×2): 5 mg via ORAL
  Filled 2015-01-24 (×2): qty 1

## 2015-01-24 MED ORDER — FERROUS SULFATE 325 (65 FE) MG PO TABS
325.0000 mg | ORAL_TABLET | Freq: Three times a day (TID) | ORAL | Status: DC
  Administered 2015-01-24 – 2015-02-03 (×16): 325 mg via ORAL
  Filled 2015-01-24 (×17): qty 1

## 2015-01-24 MED ORDER — INSULIN DETEMIR 100 UNIT/ML ~~LOC~~ SOLN
13.0000 [IU] | Freq: Two times a day (BID) | SUBCUTANEOUS | Status: DC
  Administered 2015-01-24 – 2015-01-25 (×2): 13 [IU] via SUBCUTANEOUS
  Filled 2015-01-24 (×3): qty 0.13

## 2015-01-24 MED ORDER — FLUOXETINE HCL 20 MG PO CAPS
20.0000 mg | ORAL_CAPSULE | Freq: Every morning | ORAL | Status: DC
  Administered 2015-01-25 – 2015-02-03 (×9): 20 mg via ORAL
  Filled 2015-01-24 (×10): qty 1

## 2015-01-24 MED ORDER — OXYCODONE HCL 5 MG PO TABS
5.0000 mg | ORAL_TABLET | ORAL | Status: DC | PRN
  Administered 2015-01-24 – 2015-01-27 (×7): 5 mg via ORAL
  Filled 2015-01-24 (×7): qty 1

## 2015-01-24 MED ORDER — SODIUM CHLORIDE 0.9 % IV SOLN
INTRAVENOUS | Status: AC
  Administered 2015-01-24: 18:00:00 via INTRAVENOUS

## 2015-01-24 MED ORDER — HYDROMORPHONE HCL 1 MG/ML IJ SOLN: 1.0000 mg | INTRAMUSCULAR | Status: DC | PRN

## 2015-01-24 MED ORDER — HEPARIN SODIUM (PORCINE) 5000 UNIT/ML IJ SOLN
5000.0000 [IU] | Freq: Three times a day (TID) | INTRAMUSCULAR | Status: DC
  Administered 2015-01-24 – 2015-02-03 (×28): 5000 [IU] via SUBCUTANEOUS
  Filled 2015-01-24 (×27): qty 1

## 2015-01-24 MED ORDER — CALCIUM ACETATE (PHOS BINDER) 667 MG PO CAPS
667.0000 mg | ORAL_CAPSULE | Freq: Two times a day (BID) | ORAL | Status: DC
  Administered 2015-01-24 – 2015-02-03 (×17): 667 mg via ORAL
  Filled 2015-01-24 (×16): qty 1

## 2015-01-24 MED ORDER — INSULIN ASPART 100 UNIT/ML ~~LOC~~ SOLN
0.0000 [IU] | Freq: Three times a day (TID) | SUBCUTANEOUS | Status: DC
  Administered 2015-01-24: 5 [IU] via SUBCUTANEOUS
  Administered 2015-01-26: 8 [IU] via SUBCUTANEOUS
  Administered 2015-01-27: 3 [IU] via SUBCUTANEOUS
  Administered 2015-01-27: 2 [IU] via SUBCUTANEOUS
  Administered 2015-01-27: 11 [IU] via SUBCUTANEOUS
  Administered 2015-01-28 (×2): 3 [IU] via SUBCUTANEOUS
  Administered 2015-01-28: 5 [IU] via SUBCUTANEOUS
  Administered 2015-01-29: 3 [IU] via SUBCUTANEOUS
  Administered 2015-01-31: 5 [IU] via SUBCUTANEOUS
  Administered 2015-01-31: 2 [IU] via SUBCUTANEOUS
  Administered 2015-02-01 – 2015-02-03 (×3): 3 [IU] via SUBCUTANEOUS

## 2015-01-24 MED ORDER — CALCITRIOL 0.25 MCG PO CAPS
0.2500 ug | ORAL_CAPSULE | ORAL | Status: DC
  Administered 2015-01-24 – 2015-02-03 (×5): 0.25 ug via ORAL
  Filled 2015-01-24 (×6): qty 1

## 2015-01-24 MED ORDER — SODIUM CHLORIDE 0.9 % IV BOLUS (SEPSIS)
1000.0000 mL | Freq: Once | INTRAVENOUS | Status: AC
  Administered 2015-01-24: 1000 mL via INTRAVENOUS

## 2015-01-24 MED ORDER — DEXTROSE 5 % IV SOLN
2.0000 g | INTRAVENOUS | Status: DC
  Administered 2015-01-24 – 2015-01-28 (×6): 2 g via INTRAVENOUS
  Filled 2015-01-24 (×7): qty 2

## 2015-01-24 MED ORDER — VANCOMYCIN HCL 10 G IV SOLR
1500.0000 mg | Freq: Once | INTRAVENOUS | Status: AC
  Administered 2015-01-24: 1500 mg via INTRAVENOUS
  Filled 2015-01-24: qty 1500

## 2015-01-24 NOTE — ED Notes (Signed)
Attempted to call report

## 2015-01-24 NOTE — H&P (Signed)
[facility] History and Physical                                                                                    Holly [name], is a 49 y.o. female  MRN: [id number]   DOB - [birth year]  Admit Date - 01/24/2015  Outpatient Primary MD for the patient is [name], MD  Referring MD: Lacinda Axon / ER  Consulting MD: Erlinda Hong / Orthopedics  PMH: Past Medical History  Diagnosis Date  . Bulimia   . Acute cystitis   . Anxiety state, unspecified   . Essential hypertension, benign   . 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 Dysthymic disorder   . Peripheral autonomic neuropathy in disorders classified elsewhere(337.1)   . Diarrhea     symptomatic  . Edema     moderate  . Esophageal reflux   . HLD (hyperlipidemia)     HDL goal >50, LDL goal <100  . Unspecified essential hypertension     Renal Dopplers 12/23/09 at [facility] cardi[facility] in [facility]: No significant renal artery stenosis bilaterally  . Disorders of magnesium metabolism   . Irritable bowel syndrome   . Left heart failure ([place])   . Muscle weakness (generalized)   . Dysuria   . Palpitations   . Primary pulmonary hypertension (HCC)     not seen at [facility]  (76mm Hg), pt not aware of this  . Neuralgia, neuritis, and radiculitis, unspecified   . Encounter for long-term (current) use of other medications   . Pneumonia 2010  . DM2 (diabetes mellitus, type 2) ([place])   . Type II or unspecified type diabetes mellitus with other specified manifestations, not stated as uncontrolled     Cardiac catheterization 7/10 at [facility] cardiology in [facility]: Normal coronary arteries  . Background diabetic retinopathy(362.01)   . History of blood transfusion X 6-7; 2010 - present (03/29/2014)    "related to OR; kidney issues"  . Iron deficiency anemia     "suppose to get procrit injections q 3 wks; usually don't" do it" (03/29/2014)  . Calculus of kidney   . Chronic kidney disease, stage IV (severe) ([place])   .  Complication of anesthesia     slow to wake up, had lost a lot of blood - 2010  . Heart murmur     been told very slight, never given her any problems      PSH: Past Surgical History  Procedure Laterality Date  . Tonsillectomy and adenoidectomy    . Cesarean section  2001; 1999; 1994  . Appendectomy    . Dilation and curettage of uterus    . Pars plana vitrectomy Bilateral 2010    "related to DM"  . Eye surgery    . Incision and drainage of wound Right 2010    "serious staph infection"  . Av fistula placement Right 01/08/2015    Procedure: ARTERIOVENOUS (AV) FISTULA CREATION;  Surgeon: Angelia Mould, MD;  Location: [place];  Service: Vascular;  Laterality: Right;     CC:  Chief Complaint  Patient presents with  . Foot Pain     HPI: 49 year female patient with  known diabetes with significant peripheral neuropathy, advanced chronic kidney disease stage IV with recent placement of AV fistula on 12/13, anemia of chronic kidney disease, hypertension, chronic diastolic heart failure and diabetes on insulin. Patient presented to the ED with complaints of foot pain with recent fevers and chills associated with a foul-smell. Patient reports she has recently been utilizing a compressive type dressing to the left foot as prescribed by her podiatrist. About 3 days ago she began noticing pain in the foot and suspected she may have placed the dressing too tightly. When the dressing was removed she noticed swelling and discoloration of the left foot with redness as well as with maceration of the skin around the left pinky toe. Over the past 2 days she has noticed episodic fevers and chills with a MAXIMUM TEMPERATURE of 100.5, poor oral intake with nausea and vomiting. Because of her poor intake she did not utilize her typical insulin therapies. In the past 24 hours she's noticed increasing pain and now a foul odor. She was unable to get an appointment with her podiatrist today so she presented  to the ER for treatment.  ER Evaluation and treatment: Afebrile and hemodynamically stable in the ER without hypoxemia Complete left foot x-ray: Extensive soft tissue swelling with soft tissue air laterally primarily involving the tissue surrounding the fifth metatarsal and fifth phalanges but also with soft tissue air volar to the fourth MTP joint and lateral to the fourth proximal middle phalanges. These findings are consistent with infection and possible gas gangrene. No overt osteomyelitis was appreciated. Sodium 132 chloride 95, renal function stable and at baseline with BUN 71 and creatinine 4.34, glucose elevated at 287 White count 10,700, neutrophils 89% with absolute neutrophils 9.5% hemoglobin 16.3 with baseline between 11 and 12, platelets low at 126,000 Blood cultures have been obtained in the ER Additional labs from 12/20; iron 15 with TIBC 207, saturation 7 and ferritin 650 Additional labs from 12/13: screening PCR positive for staph aureus  Review of Systems   In addition to the HPI above,  No Headache, changes with Vision or hearing, new weakness, tingling, numbness in any extremity, No problems swallowing food or Liquids, indigestion/reflux No Chest pain, Cough or Shortness of Breath, palpitations, orthopnea or DOE No Abdominal pain, N/V; no melena or hematochezia, no dark tarry stools, Bowel movements are regular, No dysuria, hematuria or flank pain No recent weight gain or loss No polyuria, polydypsia or polyphagia,  *A full 10 point Review of Systems was done, except as stated above, all other Review of Systems were negative.  Social History Social History  Substance Use Topics  . Smoking status: Never Smoker   . Smokeless tobacco: Never Used  . Alcohol Use: Yes     Comment: 03/29/2014 "might have a drink a couple times/yr"    Resides at: Private residence  Lives with: Alone  Ambulatory status: Without assistive devices   Family History Family History    Problem Relation Age of Onset  . Hypertension Mother   . Myelodysplastic syndrome Father   . Pancreatic cancer       Prior to Admission medications   Medication Sig Start Date End Date Taking? Authorizing Provider  AURYXIA 1 GM 210 MG(FE) TABS Take 210 mg by mouth 3 (three) times daily with meals. 01/01/15  Yes Historical Provider, MD  benazepril (LOTENSIN) 40 MG tablet Take 1 tablet (40 mg total) by mouth daily. 01/07/12  Yes Josue Hector, MD  calcitRIOL (ROCALTROL) 0.25 MCG capsule  Take 0.25 mcg by mouth every other day.   Yes Historical Provider, MD  calcium acetate (PHOSLO) 667 MG capsule Take 667 mg by mouth 2 (two) times daily.   Yes Historical Provider, MD  Epoetin Alfa (PROCRIT IJ) Inject as directed every 21 ( twenty-one) days.   Yes Historical Provider, MD  felodipine (PLENDIL) 5 MG 24 hr tablet Take 5 mg by mouth at bedtime. 12/16/14  Yes Historical Provider, MD  FLUoxetine (PROZAC) 20 MG capsule Take 20 mg by mouth every morning.    Yes Historical Provider, MD  furosemide (LASIX) 40 MG tablet Take 40 mg by mouth daily. 10/14/14  Yes Historical Provider, MD  glucose blood (ONETOUCH VERIO) test strip 1 each by Other route 2 (two) times daily. And lancets 2/day 250/.61 05/25/12  Yes Renato Shin, MD  insulin detemir (LEVEMIR) 100 UNIT/ML injection Inject 13 Units into the skin 2 (two) times daily.    Yes Historical Provider, MD  linagliptin (TRADJENTA) 5 MG TABS tablet Take 5 mg by mouth daily.   Yes Historical Provider, MD  sodium bicarbonate 650 MG tablet Take 1,300 mg by mouth 2 (two) times daily.   Yes Historical Provider, MD  oxyCODONE-acetaminophen (ROXICET) 5-325 MG tablet Take 1 tablet by mouth every 6 (six) hours as needed. 01/08/15   Alvia Grove, PA-C    Allergies  Allergen Reactions  . Bactrim Nausea And Vomiting  . Byetta 10 Mcg Pen [Exenatide] Nausea And Vomiting  . Hctz [Hydrochlorothiazide] Other (See Comments)    dizziness  . Spironolactone Other (See  Comments)    Dizziness   . Sulfa Antibiotics Nausea And Vomiting    Physical Exam  Vitals  Blood pressure 123/69, pulse 88, temperature 98.5 F (36.9 C), temperature source Oral, resp. rate 18, weight 170 lb (77.111 kg), SpO2 98 %.   General:  In no acute distress, appears healthy and well nourished  Psych:  Normal affect, Denies Suicidal or Homicidal ideations, Awake Alert, Oriented X 3. Speech and thought patterns are clear and appropriate, no apparent short term memory deficits  Neuro:   No focal neurological deficits, CN II through XII intact, Strength 5/5 all 4 extremities, Sensation intact all 4 extremities.  ENT:  Ears and Eyes appear Normal, Conjunctivae clear, PER. Moist oral mucosa without erythema or exudates.  Neck:  Supple, No lymphadenopathy appreciated  Respiratory:  Symmetrical chest wall movement, Good air movement bilaterally, CTAB. Room Air  Cardiac:  RRR, No Murmurs, no LE edema noted, no JVD, No carotid bruits, peripheral pulses palpable at 2+ although due to edema left foot dorsalis pedis pulse was not palpable but brisk 2+ posterior tibialis pulse was palpable on left foot  Abdomen:  Positive bowel sounds, Soft, Non tender, Non distended,  No masses appreciated, no obvious hepatosplenomegaly  Skin:  No Cyanosis, Normal Skin Turgor except as noted below in pictures  Extremities: Asymmetric with left foot edema and cellulitic skin changes positive odor (see pictures below),  no effusions.   Chronic, stable plantar ulcer left foot   New edema with cellulitic changes dorsal surface left foot and pinky toe    Additional views cellulitic skin changes plantar surface left foot pinky toe with edema  Data Review  CBC  Recent Labs Lab 01/22/15 1240 01/24/15 1110  WBC  --  10.7*  HGB 11.3* 16.3*  HCT  --  48.9*  PLT  --  126*  MCV  --  90.9  MCH  --  30.3  MCHC  --  33.3  RDW  --  13.1  LYMPHSABS  --  0.4*  MONOABS  --  0.8  EOSABS  --  0.0    BASOSABS  --  0.0    Chemistries   Recent Labs Lab 01/24/15 1110  NA 132*  K 4.1  CL 95*  CO2 22  GLUCOSE 287*  BUN 71*  CREATININE 4.34*  CALCIUM 9.3  AST 12*  ALT 12*  ALKPHOS 78  BILITOT 0.9    estimated creatinine clearance is 16.2 mL/min (by C-G formula based on Cr of 4.34).  No results for input(s): TSH, T4TOTAL, T3FREE, THYROIDAB in the last 72 hours.  Invalid input(s): FREET3  Coagulation profile No results for input(s): INR, PROTIME in the last 168 hours.  No results for input(s): DDIMER in the last 72 hours.  Cardiac Enzymes No results for input(s): CKMB, TROPONINI, MYOGLOBIN in the last 168 hours.  Invalid input(s): CK  Invalid input(s): POCBNP  Urinalysis    Component Value Date/Time   COLORURINE YELLOW 03/29/2014 1331   APPEARANCEUR TURBID* 03/29/2014 1331   LABSPEC 1.016 03/29/2014 1331   PHURINE 6.0 03/29/2014 1331   GLUCOSEU >1000* 03/29/2014 1331   HGBUR LARGE* 03/29/2014 1331   BILIRUBINUR NEGATIVE 03/29/2014 1331   KETONESUR NEGATIVE 03/29/2014 1331   PROTEINUR >300* 03/29/2014 1331   UROBILINOGEN 0.2 03/29/2014 1331   NITRITE POSITIVE* 03/29/2014 1331   LEUKOCYTESUR LARGE* 03/29/2014 1331    Imaging results:   Dg Foot Complete Left  01/24/2015  CLINICAL DATA:  Soft tissue swelling with wounds laterally. EXAM: LEFT FOOT - COMPLETE 3+ VIEW COMPARISON:  December 18, 2014 FINDINGS: Frontal, oblique, and lateral views were obtained. There is extensive soft tissue air laterally in the regions overlying the fifth metatarsal, fifth proximal phalanx, and fifth distal phalanx. There is also soft tissue air in the lateral aspect of the proximal fourth digit. There is diffuse soft tissue swelling, primarily in the midfoot and forefoot regions. Air is seen the volar to the fourth MTP joint as well. There is no overt bony destruction. No erosive change. No fracture or dislocation. No appreciable joint space narrowing. There is a spur arising from  the inferior calcaneus. IMPRESSION: Extensive soft tissue swelling with soft tissue air laterally, primarily involving the soft tissue surrounding the fifth metatarsal and fifth phalanges, but also with soft tissue air volar to the fourth MTP joint and lateral to the fourth proximal and middle phalanges. This air is consistent with infection with possibility of gas gangrene. No overt osteomyelitis is appreciable. No fracture or dislocation. No erosive change. There is a small calcaneal spur inferiorly. Electronically Signed   By: Lowella Grip III M.D.   On: 01/24/2015 12:03     Assessment & Plan  Principal Problem:   Severe purulent Cellulitis of left foot/ Peripheral neuropathy -Inpatient admission/medical floor -No evidence of osteomyelitis on plain films but may need MRI to better clarify; defer to consulting orthopedic physician -Orthopedics team has been consulted -Broad spectrum coverage to cover both MRSA and Pseudomonas with pharmacy dosing medications especially in setting of patient's chronic kidney disease -Treat pain with oral and IV narcotics -Neurovascular checks every 4 hours  Active Problems:   Diabetes mellitus type 2, uncontrolled  -CBGs greater than 200 -She reports was noncompliant with insulin at home secondary to poor oral intake; explained to patient that since insulin-dependent needs to take a minimum of baseline insulin dosing when sick and potentially higher dose -Check hemoglobin A1c -Continue preadmission Levemir and Tradjenta -  SSI    Thrombocytopenia  -Likely related to hypoperfusion in setting of acute infection/chronic kidney disease -Follow labs -? Marker of gram-negative infection    HTN  -Blood pressure currently controlled -In setting of acute dehydration we will hold preadmission benazepril at least for first 24 hours    Dehydration, moderate/CKD, stage V  -Secondary to acute infectious process and poor oral intake prior to admission -Holding  ACE inhibitor and diuretic for 24 hours then reevaluate -Continue preadmission sodium bicarbonate -Pharmacy to dose antibiotics based on creatinine clearance -Gentle IV fluid hydration at 50 mL per hour for 24 hours only -Had AV fistula placed 01/08/15    Chronic diastolic heart failure  -Currently compensated but monitor closely during IV infusion -ACE inhibitor and diuretic on hold    Anemia in chronic kidney disease -Continue Auryxia and Procrit -Iron level was less than 20 on 12/27-once acute illness improved consider iron infusion during this hospitalization    Neurogenic bladder/suspected -Monitor for urinary retention with use of pain medications/narcotics    DVT Prophylaxis: Subcutaneous heparin  Family Communication: Family member at bedside with patient's permission     Code Status:  Full code  Condition:  Stable  Discharge disposition: Anticipate discharge back to home environment pending medical stability; discharge disposition in time he could change if patient becomes a surgical candidate based on severity of her cellulitis  Time spent in minutes : 60      ELLIS,ALLISON L. ANP on 01/24/2015 at 2:04 PM  You may contact me by going to [email or web address] - password [email or web address]  I am available from 7a-7p but please confirm I am on the schedule by going to Amion as above.   After 7p please contact night coverage person covering me after hours  [facility] I have taken an interval history, reviewed the chart and examined the patient. I agree with the Advanced Practice Provider's note, impression and recommendations. I have made any necessary editorial changes.  49 year old female with a history of diabetes mellitus, CKD stage IV, recent AV fistula placement on 1213, hypertension, chronic diastolic heart failure who came to the ED with left foot pain and swelling for about 3 days. Patient was recently seen by podiatrist and a compressive dressing was placed on the  left foot after that she began noticing swelling and discoloration of left foot with redness. Patient also had fever and chills. X-ray of the foot showed extensive soft tissue swelling with soft tissue air laterally involving the soft tissue surrounding the fifth metatarsal and fifth phalanges. Presence of air consistent with infection with possibility of gas gangrene.  On exam Left foot- warm to touch, tender to palpation, edema. Left fifth toe erythematous, foul-smelling discharge noted.  Assessment Left foot cellulitis Diabetes mellitus C KD stage IV Thrombocytopenia  Start vancomycin and Ceftazidime per pharmacy, orthopedics consultation. Sliding-scale insulin

## 2015-01-24 NOTE — ED Notes (Signed)
Wound culture obtained from left 5th toe.

## 2015-01-24 NOTE — ED Notes (Signed)
Dr. Sherrian Divers at bedside

## 2015-01-24 NOTE — ED Notes (Signed)
Reports she has had ulcers on the left foot since March and is being seen at the [facility], but the pain is worse today and could not get an appt. Odor noted.

## 2015-01-24 NOTE — ED Provider Notes (Signed)
CSN: [id number]     Arrival date & time 01/24/15  0944 History   First MD Initiated Contact with Patient 01/24/15 1026     Chief Complaint  Patient presents with  . Foot Pain     (Consider location/radiation/quality/duration/timing/severity/associated sxs/prior Treatment) HPI   Holly [name] is a 49 y.o. female, with a history of DM, left heart failure, CKD stage IV, iron deficiency anemia, pulmonary hypertension, and chronic inflammatory demyelinating polyneuritis, presenting to the ED with left foot swelling and pain for three days. Pt has had an ulcer on the ball of that foot since March. Pt has been vomiting and diarrhea for the past three days as well. Pt does not check her blood sugar often and has not used her insulin in the last three days because she has not been able to keep any food down. Pt also endorses fever of 100.5. Pt was seen for her vomiting and diarrhea by her PCP two days ago and diagnosed with a virus. Pt has been followed by foot care center, with her last appt three weeks ago. Pt contacted the foot center and was told they couldn't see her until January. Pt rates her pain at 7/10, "feels like a growing pressure inside the foot," non-radiating. Patient denies any other complaints.  Past Medical History  Diagnosis Date  . Bulimia   . Acute cystitis   . Anxiety state, unspecified   . Essential hypertension, benign   . Chronic inflammatory demyelinating polyneuritis ([place])   . Dysthymic disorder   . Peripheral autonomic neuropathy in disorders classified elsewhere(337.1)   . Diarrhea     symptomatic  . Edema     moderate  . Esophageal reflux   . HLD (hyperlipidemia)     HDL goal >50, LDL goal <100  . Unspecified essential hypertension     Renal Dopplers 12/23/09 at [facility] cardiology in [facility]: No significant renal artery stenosis bilaterally  . Disorders of magnesium metabolism   . Irritable bowel syndrome   . Left heart failure ([place])   . Muscle weakness  (generalized)   . Dysuria   . Palpitations   . Primary pulmonary hypertension (HCC)     not seen at CATH  (62mm Hg), pt not aware of this  . Neuralgia, neuritis, and radiculitis, unspecified   . Encounter for long-term (current) use of other medications   . Pneumonia 2010  . DM2 (diabetes mellitus, type 2) ([place])   . Type II or unspecified type diabetes mellitus with other specified manifestations, not stated as uncontrolled     Cardiac catheterization 7/10 at [facility] cardiology in [facility]: Normal coronary arteries  . Background diabetic retinopathy(362.01)   . History of blood transfusion X 6-7; 2010 - present (03/29/2014)    "related to OR; kidney issues"  . Iron deficiency anemia     "suppose to get procrit injections q 3 wks; usually don't" do it" (03/29/2014)  . Calculus of kidney   . Chronic kidney disease, stage IV (severe) ([place])   . Complication of anesthesia     slow to wake up, had lost a lot of blood - 2010  . Heart murmur     been told very slight, never given her any problems   Past Surgical History  Procedure Laterality Date  . Tonsillectomy and adenoidectomy    . Cesarean section  2001; 1999; 1994  . Appendectomy    . Dilation and curettage of uterus    . Pars plana vitrectomy Bilateral 2010    "  related to DM"  . Eye surgery    . Incision and drainage of wound Right 2010    "serious staph infection"  . Av fistula placement Right 01/08/2015    Procedure: ARTERIOVENOUS (AV) FISTULA CREATION;  Surgeon: Angelia Mould, MD;  Location: [facility] OR;  Service: Vascular;  Laterality: Right;   Family History  Problem Relation Age of Onset  . Hypertension Mother   . Myelodysplastic syndrome Father   . Pancreatic cancer     Social History  Substance Use Topics  . Smoking status: Never Smoker   . Smokeless tobacco: Never Used  . Alcohol Use: Yes     Comment: 03/29/2014 "might have a drink a couple times/yr"   OB History    No data available     Review of Systems   Constitutional: Negative for fever and chills.  Gastrointestinal: Negative for nausea and vomiting.  Skin: Positive for wound.       Foot ulcer on the bottom of left foot.  All other systems reviewed and are negative.     Allergies  Bactrim; Byetta 10 mcg pen; Hctz; Spironolactone; and Sulfa antibiotics  Home Medications   Prior to Admission medications   Medication Sig Start Date End Date Taking? Authorizing Provider  AURYXIA 1 GM 210 MG(FE) TABS Take 210 mg by mouth 3 (three) times daily with meals. 01/01/15  Yes Historical Provider, MD  benazepril (LOTENSIN) 40 MG tablet Take 1 tablet (40 mg total) by mouth daily. 01/07/12  Yes Josue Hector, MD  calcitRIOL (ROCALTROL) 0.25 MCG capsule Take 0.25 mcg by mouth every other day.   Yes Historical Provider, MD  calcium acetate (PHOSLO) 667 MG capsule Take 667 mg by mouth 2 (two) times daily.   Yes Historical Provider, MD  Epoetin Alfa (PROCRIT IJ) Inject as directed every 21 ( twenty-one) days.   Yes Historical Provider, MD  felodipine (PLENDIL) 5 MG 24 hr tablet Take 5 mg by mouth at bedtime. 12/16/14  Yes Historical Provider, MD  FLUoxetine (PROZAC) 20 MG capsule Take 20 mg by mouth every morning.    Yes Historical Provider, MD  furosemide (LASIX) 40 MG tablet Take 40 mg by mouth daily. 10/14/14  Yes Historical Provider, MD  glucose blood (ONETOUCH VERIO) test strip 1 each by Other route 2 (two) times daily. And lancets 2/day 250/.61 05/25/12  Yes Renato Shin, MD  insulin detemir (LEVEMIR) 100 UNIT/ML injection Inject 13 Units into the skin 2 (two) times daily.    Yes Historical Provider, MD  linagliptin (TRADJENTA) 5 MG TABS tablet Take 5 mg by mouth daily.   Yes Historical Provider, MD  sodium bicarbonate 650 MG tablet Take 1,300 mg by mouth 2 (two) times daily.   Yes Historical Provider, MD  oxyCODONE-acetaminophen (ROXICET) 5-325 MG tablet Take 1 tablet by mouth every 6 (six) hours as needed. 01/08/15   Joelene Millin A Trinh, PA-C   BP  117/57 mmHg  Pulse 86  Temp(Src) 100.3 F (37.9 C) (Oral)  Resp 18  Wt 77.111 kg  SpO2 96% Physical Exam  Constitutional: She appears well-developed and well-nourished. No distress.  HENT:  Head: Normocephalic and atraumatic.  Eyes: Conjunctivae are normal. Pupils are equal, round, and reactive to light.  Cardiovascular: Normal rate, regular rhythm, normal heart sounds and intact distal pulses.   Pulmonary/Chest: Effort normal and breath sounds normal. No respiratory distress.  Abdominal: Soft. Bowel sounds are normal.  Musculoskeletal: She exhibits no edema or tenderness.  Swelling to left distal foot extending  to just below the ankle. Erythema and tenderness to same. Ulcer noted on the ball of patients left foot about the diameter of a quarter and about 2 cm deep. Purulent discharge noted inside ulcer.  Ulcer, with evidence of healing, noted to the bottom of right foot.   Lymphadenopathy:    She has no cervical adenopathy.  Neurological: She is alert. She has normal reflexes.  No sensory deficits. Strength 5/5 in all extremities.   Skin: Skin is warm and dry. She is not diaphoretic.  Nursing note and vitals reviewed.   ED Course  Procedures (including critical care time) Labs Review Labs Reviewed  CBC WITH DIFFERENTIAL/PLATELET - Abnormal; Notable for the following:    WBC 10.7 (*)    RBC 5.38 (*)    Hemoglobin 16.3 (*)    HCT 48.9 (*)    Platelets 126 (*)    Neutro Abs 9.5 (*)    Lymphs Abs 0.4 (*)    All other components within normal limits  COMPREHENSIVE METABOLIC PANEL - Abnormal; Notable for the following:    Sodium 132 (*)    Chloride 95 (*)    Glucose, Bld 287 (*)    BUN 71 (*)    Creatinine, Ser 4.34 (*)    Albumin 2.9 (*)    AST 12 (*)    ALT 12 (*)    GFR calc non Af Amer 11 (*)    GFR calc Af Amer 13 (*)    All other components within normal limits  CBG MONITORING, ED - Abnormal; Notable for the following:    Glucose-Capillary 259 (*)    All other  components within normal limits  WOUND CULTURE  CULTURE, BLOOD (ROUTINE X 2)  CULTURE, BLOOD (ROUTINE X 2)  HEMOGLOBIN A1C    Imaging Review Dg Foot Complete Left  01/24/2015  CLINICAL DATA:  Soft tissue swelling with wounds laterally. EXAM: LEFT FOOT - COMPLETE 3+ VIEW COMPARISON:  December 18, 2014 FINDINGS: Frontal, oblique, and lateral views were obtained. There is extensive soft tissue air laterally in the regions overlying the fifth metatarsal, fifth proximal phalanx, and fifth distal phalanx. There is also soft tissue air in the lateral aspect of the proximal fourth digit. There is diffuse soft tissue swelling, primarily in the midfoot and forefoot regions. Air is seen the volar to the fourth MTP joint as well. There is no overt bony destruction. No erosive change. No fracture or dislocation. No appreciable joint space narrowing. There is a spur arising from the inferior calcaneus. IMPRESSION: Extensive soft tissue swelling with soft tissue air laterally, primarily involving the soft tissue surrounding the fifth metatarsal and fifth phalanges, but also with soft tissue air volar to the fourth MTP joint and lateral to the fourth proximal and middle phalanges. This air is consistent with infection with possibility of gas gangrene. No overt osteomyelitis is appreciable. No fracture or dislocation. No erosive change. There is a small calcaneal spur inferiorly. Electronically Signed   By: Lowella Grip III M.D.   On: 01/24/2015 12:03   I have personally reviewed and evaluated these images and lab results as part of my medical decision-making.   EKG Interpretation None      MDM   Final diagnoses:  Foot ulcer, left, with fat layer exposed ([place])  Cellulitis of left lower extremity  Uncontrolled type 2 diabetes mellitus with foot ulcer, unspecified long term insulin use status (HCC)    Vilinda Blanks Licht presents with left foot swelling, pain, and redness  surrounding a foot  ulcer.  Findings and plan of care discussed with Nat Christen, MD.  Patient currently has no signs of systemic infection or sepsis criteria. Patient is afebrile, not tachycardic, not tachypneic, is normotensive, nontoxic appearing, maintaining SPO2 of 97-100% on room air. 1:27 PM Spoke with Ebony Hail from [facility] who agreed to admit the patient to a MedSurg floor. Attending physician is Dr. Darrick Meigs. No further instructions. Patient was informed of the x-ray results as well as the decision and the reasons for admission. Patient states that she is comfortable with admission. Patient CBG noted to be abnormal, but not critically high that would require immediate insulin administration.  Lorayne Bender, PA-C 01/24/15 1531  Nat Christen, MD 01/24/15 1537

## 2015-01-24 NOTE — Progress Notes (Addendum)
ANTIBIOTIC CONSULT NOTE - INITIAL  Pharmacy Consult for vancomycin  Indication: wound infection  Allergies  Allergen Reactions  . Bactrim Nausea And Vomiting  . Byetta 10 Mcg Pen [Exenatide] Nausea And Vomiting  . Hctz [Hydrochlorothiazide] Other (See Comments)    dizziness  . Spironolactone Other (See Comments)    Dizziness   . Sulfa Antibiotics Nausea And Vomiting    Patient Measurements: Weight: 170 lb (77.111 kg) Adjusted Body Weight:   Vital Signs: Temp: 98.5 F (36.9 C) (12/29 0952) Temp Source: Oral (12/29 0952) BP: 123/69 mmHg (12/29 1215) Pulse Rate: 88 (12/29 1215) Intake/Output from previous day:   Intake/Output from this shift:    Labs:  Recent Labs  01/22/15 1240 01/24/15 1110  WBC  --  10.7*  HGB 11.3* 16.3*  PLT  --  126*  CREATININE  --  4.34*   Estimated Creatinine Clearance: 16.2 mL/min (by C-G formula based on Cr of 4.34). No results for input(s): VANCOTROUGH, VANCOPEAK, VANCORANDOM, GENTTROUGH, GENTPEAK, GENTRANDOM, TOBRATROUGH, TOBRAPEAK, TOBRARND, AMIKACINPEAK, AMIKACINTROU, AMIKACIN in the last 72 hours.   Microbiology: Recent Results (from the past 720 hour(s))  Surgical pcr screen     Status: Abnormal   Collection Time: 01/08/15  7:17 AM  Result Value Ref Range Status   MRSA, PCR NEGATIVE NEGATIVE Final   Staphylococcus aureus POSITIVE (A) NEGATIVE Final    Comment:        The Xpert SA Assay (FDA approved for NASAL specimens in patients over 101 years of age), is one component of a comprehensive surveillance program.  Test performance has been validated by [facility] for patients greater than or equal to 81 year old. It is not intended to diagnose infection nor to guide or monitor treatment.     Medical History: Past Medical History  Diagnosis Date  . Bulimia   . Acute cystitis   . Anxiety state, unspecified   . Essential hypertension, benign   . Chronic inflammatory demyelinating polyneuritis ([place])   . Dysthymic  disorder   . Peripheral autonomic neuropathy in disorders classified elsewhere(337.1)   . Diarrhea     symptomatic  . Edema     moderate  . Esophageal reflux   . HLD (hyperlipidemia)     HDL goal >50, LDL goal <100  . Unspecified essential hypertension     Renal Dopplers 12/23/09 at [facility] cardiology in [facility]: No significant renal artery stenosis bilaterally  . Disorders of magnesium metabolism   . Irritable bowel syndrome   . Left heart failure ([place])   . Muscle weakness (generalized)   . Dysuria   . Palpitations   . Primary pulmonary hypertension (HCC)     not seen at CATH  (55mm Hg), pt not aware of this  . Neuralgia, neuritis, and radiculitis, unspecified   . Encounter for long-term (current) use of other medications   . Pneumonia 2010  . DM2 (diabetes mellitus, type 2) ([place])   . Type II or unspecified type diabetes mellitus with other specified manifestations, not stated as uncontrolled     Cardiac catheterization 7/10 at [facility] cardiology in [facility]: Normal coronary arteries  . Background diabetic retinopathy(362.01)   . History of blood transfusion X 6-7; 2010 - present (03/29/2014)    "related to OR; kidney issues"  . Iron deficiency anemia     "suppose to get procrit injections q 3 wks; usually don't" do it" (03/29/2014)  . Calculus of kidney   . Chronic kidney disease, stage IV (severe) ([place])   .  Complication of anesthesia     slow to wake up, had lost a lot of blood - 2010  . Heart murmur     been told very slight, never given her any problems    Medications:  See EMR  Assessment: 49 yo female admitted with L foot infection, likely from chronic ulcers. Pt has CKD stage IV and has not yet begun dialysis. WBC 10.7, afebrile.   Goal of Therapy:  Vancomycin trough level 10-15 mcg/ml    Plan:  -Vancomycin 1500 mg IV x1 then 1g/48h -Monitor renal fx, cultures, obtain VR as needed -Ceftazidime 2 g IV q24h   Hughes Better M 01/24/2015,1:24 PM

## 2015-01-25 DIAGNOSIS — E118 Type 2 diabetes mellitus with unspecified complications: Secondary | ICD-10-CM

## 2015-01-25 DIAGNOSIS — E860 Dehydration: Secondary | ICD-10-CM

## 2015-01-25 LAB — COMPREHENSIVE METABOLIC PANEL
ALK PHOS: 64 U/L (ref 38–126)
ALT: 7 U/L — AB (ref 14–54)
AST: 14 U/L — ABNORMAL LOW (ref 15–41)
Albumin: 2.2 g/dL — ABNORMAL LOW (ref 3.5–5.0)
Anion gap: 10 (ref 5–15)
BUN: 76 mg/dL — ABNORMAL HIGH (ref 6–20)
CALCIUM: 8.3 mg/dL — AB (ref 8.9–10.3)
CO2: 22 mmol/L (ref 22–32)
CREATININE: 4.92 mg/dL — AB (ref 0.44–1.00)
Chloride: 101 mmol/L (ref 101–111)
GFR, EST AFRICAN AMERICAN: 11 mL/min — AB (ref >60)
GFR, EST NON AFRICAN AMERICAN: 9 mL/min — AB (ref >60)
Glucose, Bld: 116 mg/dL — ABNORMAL HIGH (ref 65–99)
Potassium: 4.2 mmol/L (ref 3.5–5.1)
SODIUM: 133 mmol/L — AB (ref 135–145)
Total Bilirubin: 0.7 mg/dL (ref 0.3–1.2)
Total Protein: 5.8 g/dL — ABNORMAL LOW (ref 6.5–8.1)

## 2015-01-25 LAB — CBC
HCT: 29.3 % — ABNORMAL LOW (ref 36.0–46.0)
Hemoglobin: 9.2 g/dL — ABNORMAL LOW (ref 12.0–15.0)
MCH: 28.7 pg (ref 26.0–34.0)
MCHC: 31.4 g/dL (ref 30.0–36.0)
MCV: 91.3 fL (ref 78.0–100.0)
PLATELETS: 183 10*3/uL (ref 150–400)
RBC: 3.21 MIL/uL — AB (ref 3.87–5.11)
RDW: 13.2 % (ref 11.5–15.5)
WBC: 14.9 10*3/uL — ABNORMAL HIGH (ref 4.0–10.5)

## 2015-01-25 LAB — GLUCOSE, CAPILLARY
GLUCOSE-CAPILLARY: 112 mg/dL — AB (ref 65–99)
GLUCOSE-CAPILLARY: 40 mg/dL — AB (ref 65–99)
GLUCOSE-CAPILLARY: 40 mg/dL — AB (ref 65–99)
Glucose-Capillary: 83 mg/dL (ref 65–99)
Glucose-Capillary: 92 mg/dL (ref 65–99)
Glucose-Capillary: 38 mg/dL — CL (ref 65–99)
Glucose-Capillary: 77 mg/dL (ref 65–99)

## 2015-01-25 LAB — HEMOGLOBIN A1C
Hgb A1c MFr Bld: 7.6 % — ABNORMAL HIGH (ref 4.8–5.6)
Mean Plasma Glucose: 171 mg/dL

## 2015-01-25 MED ORDER — DEXTROSE 50 % IV SOLN
INTRAVENOUS | Status: AC
  Administered 2015-01-25: 25 mL
  Filled 2015-01-25: qty 50

## 2015-01-25 MED ORDER — DEXTROSE-NACL 5-0.45 % IV SOLN
INTRAVENOUS | Status: DC
  Administered 2015-01-26: 01:00:00 via INTRAVENOUS

## 2015-01-25 MED ORDER — SODIUM CHLORIDE 0.45 % IV SOLN
INTRAVENOUS | Status: DC
  Administered 2015-01-25: 19:00:00 via INTRAVENOUS

## 2015-01-25 NOTE — Progress Notes (Signed)
Blood Bank called primary RN to notify that patient has red cell antibodies and that if the patient needed blood during surgery on 01/26/15, the blood may take a bit longer to prepare. As suggested by Blood Bank, on call physician notified and relayed information to Dr. Ninfa Linden. Dr. Ninfa Linden did not see the need to prepare blood prior to surgery; no new orders received at this time.

## 2015-01-25 NOTE — Consult Note (Signed)
Reason for Consult: Abscess osteomyelitis left forefoot Referring Physician: Dr. Dorthy Cooler Rufer is an 49 y.o. female.  HPI: Patient is a 49 year old woman with diabetic insensate neuropathy peripheral vascular disease who presents with ulceration cellulitis of the left little toe as well as ulceration purulent drainage from the third and fourth metatarsal heads.  Past Medical History  Diagnosis Date  . Bulimia   . Acute cystitis   . Anxiety state, unspecified   . Essential hypertension, benign   . 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 Dysthymic disorder   . Peripheral autonomic neuropathy in disorders classified elsewhere(337.1)   . Diarrhea     symptomatic  . Edema     moderate  . Esophageal reflux   . HLD (hyperlipidemia)     HDL goal >50, LDL goal <100  . Unspecified essential hypertension     Renal Dopplers 12/23/09 at [facility] cardiology in [facility]: No significant renal artery stenosis bilaterally  . Disorders of magnesium metabolism   . Irritable bowel syndrome   . Left heart failure ([place])   . Muscle weakness (generalized)   . Dysuria   . Palpitations   . Primary pulmonary hypertension (HCC)     not seen at CATH  (64m Hg), pt not aware of this  . Neuralgia, neuritis, and radiculitis, unspecified   . Encounter for long-term (current) use of other medications   . Pneumonia 2010  . DM2 (diabetes mellitus, type 2) ([place]   . Type II or unspecified type diabetes mellitus with other specified manifestations, not stated as uncontrolled     [facility]  . Background diabetic retinopathy(362.01)   . History of blood transfusion X 6-7; 2010 - present (03/29/2014)    "related to OR; kidney issues"  . Iron deficiency anemia     "suppose to get procrit injections q 3 wks; usually don't" do it" (03/29/2014)  . Calculus of kidney   . Chronic kidney disease, stage IV  (severe) ([place]   . Complication of anesthesia     slow to wake up, had lost a lot of blood - 2010  . Heart murmur     been told very slight, never given her any problems  . Cellulitis and abscess of foot 01/24/2015    left foot    Past Surgical History  Procedure Laterality Date  . Tonsillectomy and adenoidectomy    . Cesarean section  2001; 1999; 1994  . Appendectomy    . Dilation and curettage of uterus    . Pars plana vitrectomy Bilateral 2010    "related to DM"  . Eye surgery    . Incision and drainage of wound Right 2010    "serious staph infection"  . Av fistula placement Right 01/08/2015    Procedure: ARTERIOVENOUS (AV) FISTULA CREATION;  Surgeon: CAngelia Mould MD;  [facility]  Service: Vascular;  Laterality: Right;    Family History  Problem Relation Age of Onset  . Hypertension Mother   . Myelodysplastic syndrome Father   . Pancreatic cancer      Social History:  reports that she has never smoked. She has never used smokeless tobacco. She reports that she drinks alcohol. She reports that she does not use illicit drugs.  Allergies:  Allergies  Allergen Reactions  . Bactrim Nausea And Vomiting  . Byetta 10 Mcg Pen [Exenatide] Nausea And Vomiting  . Hctz [Hydrochlorothiazide] Other (See Comments)  dizziness  . Spironolactone Other (See Comments)    Dizziness   . Sulfa Antibiotics Nausea And Vomiting    Medications: I have reviewed the patient's current medications.  Results for orders placed or performed during the hospital encounter of 01/24/15 (from the past 48 hour(s))  CBC with Differential     Status: Abnormal   Collection Time: 01/24/15 11:10 AM  Result Value Ref Range   WBC 10.7 (H) 4.0 - 10.5 K/uL    Comment: REPEATED TO VERIFY   RBC 5.38 (H) 3.87 - 5.11 MIL/uL   Hemoglobin 16.3 (H) 12.0 - 15.0 g/dL    Comment: REPEATED TO VERIFY   HCT 48.9 (H) 36.0 - 46.0 %   MCV 90.9 78.0 - 100.0 fL   MCH 30.3 26.0 - 34.0 pg   MCHC 33.3 30.0  - 36.0 g/dL   RDW 13.1 11.5 - 15.5 %   Platelets 126 (L) 150 - 400 K/uL    Comment: REPEATED TO VERIFY   Neutrophils Relative % 89 %   Neutro Abs 9.5 (H) 1.7 - 7.7 K/uL   Lymphocytes Relative 4 %   Lymphs Abs 0.4 (L) 0.7 - 4.0 K/uL   Monocytes Relative 7 %   Monocytes Absolute 0.8 0.1 - 1.0 K/uL   Eosinophils Relative 0 %   Eosinophils Absolute 0.0 0.0 - 0.7 K/uL   Basophils Relative 0 %   Basophils Absolute 0.0 0.0 - 0.1 K/uL  Comprehensive metabolic panel     Status: Abnormal   Collection Time: 01/24/15 11:10 AM  Result Value Ref Range   Sodium 132 (L) 135 - 145 mmol/L   Potassium 4.1 3.5 - 5.1 mmol/L   Chloride 95 (L) 101 - 111 mmol/L   CO2 22 22 - 32 mmol/L   Glucose, Bld 287 (H) 65 - 99 mg/dL   BUN 71 (H) 6 - 20 mg/dL   Creatinine, Ser 4.34 (H) 0.44 - 1.00 mg/dL   Calcium 9.3 8.9 - 10.3 mg/dL   Total Protein 6.8 6.5 - 8.1 g/dL   Albumin 2.9 (L) 3.5 - 5.0 g/dL   AST 12 (L) 15 - 41 U/L   ALT 12 (L) 14 - 54 U/L   Alkaline Phosphatase 78 38 - 126 U/L   Total Bilirubin 0.9 0.3 - 1.2 mg/dL   GFR calc non Af Amer 11 (L) >60 mL/min   GFR calc Af Amer 13 (L) >60 mL/min    Comment: (NOTE) The eGFR has been calculated using the CKD EPI equation. This calculation has not been validated in all clinical situations. eGFR's persistently <60 mL/min signify possible Chronic Kidney Disease.    Anion gap 15 5 - 15  Culture, blood (routine x 2)     Status: None (Preliminary result)   Collection Time: 01/24/15 11:10 AM  Result Value Ref Range   Specimen Description BLOOD LEFT HAND    Special Requests BOTTLES DRAWN AEROBIC AND ANAEROBIC 5CC    Culture NO GROWTH < 24 HOURS    Report Status PENDING   Hemoglobin A1c     Status: Abnormal   Collection Time: 01/24/15 11:10 AM  Result Value Ref Range   Hgb A1c MFr Bld 7.6 (H) 4.8 - 5.6 %    Comment: (NOTE)         Pre-diabetes: 5.7 - 6.4         Diabetes: >6.4         Glycemic control for adults with diabetes: <7.0    Mean Plasma  Glucose  171 mg/dL    Comment: (NOTE) Performed At: [facility] [place], [place] [id number] Lindon Romp MD FO:[id number]   POC CBG, ED     Status: Abnormal   Collection Time: 01/24/15 12:46 PM  Result Value Ref Range   Glucose-Capillary 259 (H) 65 - 99 mg/dL  Culture, blood (routine x 2)     Status: None (Preliminary result)   Collection Time: 01/24/15  1:38 PM  Result Value Ref Range   Specimen Description BLOOD LEFT HAND    Special Requests BOTTLES DRAWN AEROBIC AND ANAEROBIC 6CCS    Culture NO GROWTH < 24 HOURS    Report Status PENDING   Wound culture     Status: None (Preliminary result)   Collection Time: 01/24/15  2:05 PM  Result Value Ref Range   Specimen Description WOUND    Special Requests LEFT 5TH TOE    Gram Stain      NO WBC SEEN RARE SQUAMOUS EPITHELIAL CELLS PRESENT FEW GRAM POSITIVE COCCI IN PAIRS RARE GRAM NEGATIVE RODS Performed at [facility]    Culture PENDING    Report Status PENDING   Glucose, capillary     Status: Abnormal   Collection Time: 01/24/15  4:46 PM  Result Value Ref Range   Glucose-Capillary 208 (H) 65 - 99 mg/dL  HIV antibody     Status: None   Collection Time: 01/24/15  5:35 PM  Result Value Ref Range   HIV Screen 4th Generation wRfx Non Reactive Non Reactive    Comment: (NOTE) Performed At: [facility] [place], [place] [id number] Lindon Romp MD JG:[id number]   Glucose, capillary     Status: Abnormal   Collection Time: 01/24/15  9:01 PM  Result Value Ref Range   Glucose-Capillary 202 (H) 65 - 99 mg/dL  Comprehensive metabolic panel     Status: Abnormal   Collection Time: 01/25/15  6:20 AM  Result Value Ref Range   Sodium 133 (L) 135 - 145 mmol/L   Potassium 4.2 3.5 - 5.1 mmol/L    Comment: SLIGHT HEMOLYSIS   Chloride 101 101 - 111 mmol/L   CO2 22 22 - 32 mmol/L   Glucose, Bld 116 (H) 65 - 99 mg/dL   BUN 76 (H) 6 - 20 mg/dL   Creatinine, Ser 4.92 (H) 0.44 -  1.00 mg/dL   Calcium 8.3 (L) 8.9 - 10.3 mg/dL   Total Protein 5.8 (L) 6.5 - 8.1 g/dL   Albumin 2.2 (L) 3.5 - 5.0 g/dL   AST 14 (L) 15 - 41 U/L   ALT 7 (L) 14 - 54 U/L   Alkaline Phosphatase 64 38 - 126 U/L   Total Bilirubin 0.7 0.3 - 1.2 mg/dL   GFR calc non Af Amer 9 (L) >60 mL/min   GFR calc Af Amer 11 (L) >60 mL/min    Comment: (NOTE) The eGFR has been calculated using the CKD EPI equation. This calculation has not been validated in all clinical situations. eGFR's persistently <60 mL/min signify possible Chronic Kidney Disease.    Anion gap 10 5 - 15  CBC     Status: Abnormal   Collection Time: 01/25/15  6:20 AM  Result Value Ref Range   WBC 14.9 (H) 4.0 - 10.5 K/uL   RBC 3.21 (L) 3.87 - 5.11 MIL/uL   Hemoglobin 9.2 (L) 12.0 - 15.0 g/dL    Comment: REPEATED TO VERIFY SPECIMEN CHECKED FOR CLOTS    HCT 29.3 (L)  36.0 - 46.0 %   MCV 91.3 78.0 - 100.0 fL   MCH 28.7 26.0 - 34.0 pg   MCHC 31.4 30.0 - 36.0 g/dL   RDW 13.2 11.5 - 15.5 %   Platelets 183 150 - 400 K/uL  Glucose, capillary     Status: Abnormal   Collection Time: 01/25/15  7:14 AM  Result Value Ref Range   Glucose-Capillary 112 (H) 65 - 99 mg/dL  Glucose, capillary     Status: None   Collection Time: 01/25/15 11:50 AM  Result Value Ref Range   Glucose-Capillary 83 65 - 99 mg/dL    Dg Foot Complete Left  01/24/2015  CLINICAL DATA:  Soft tissue swelling with wounds laterally. EXAM: LEFT FOOT - COMPLETE 3+ VIEW COMPARISON:  December 18, 2014 FINDINGS: Frontal, oblique, and lateral views were obtained. There is extensive soft tissue air laterally in the regions overlying the fifth metatarsal, fifth proximal phalanx, and fifth distal phalanx. There is also soft tissue air in the lateral aspect of the proximal fourth digit. There is diffuse soft tissue swelling, primarily in the midfoot and forefoot regions. Air is seen the volar to the fourth MTP joint as well. There is no overt bony destruction. No erosive change. No  fracture or dislocation. No appreciable joint space narrowing. There is a spur arising from the inferior calcaneus. IMPRESSION: Extensive soft tissue swelling with soft tissue air laterally, primarily involving the soft tissue surrounding the fifth metatarsal and fifth phalanges, but also with soft tissue air volar to the fourth MTP joint and lateral to the fourth proximal and middle phalanges. This air is consistent with infection with possibility of gas gangrene. No overt osteomyelitis is appreciable. No fracture or dislocation. No erosive change. There is a small calcaneal spur inferiorly. Electronically Signed   By: Lowella Grip III M.D.   On: 01/24/2015 12:03    Review of Systems  All other systems reviewed and are negative.  Blood pressure 108/55, pulse 87, temperature 100 F (37.8 C), temperature source Oral, resp. rate 16, weight 80.287 kg (177 lb), SpO2 96 %. Physical Exam On examination patient has a palpable pulse. She has redness and cellulitis of the left foot no redness and swelling cellulitis of the right foot. She is purulent draining ulcer which probes down to the fourth and third metatarsal heads of the left foot. She has necrotic abscess and ulceration of the fifth toe. Radiographs shows air in the soft tissues secondary to the large ulcer however there is no definitive destructive changes of the third and fourth metatarsal heads however these are palpable within the wound. Assessment/Plan: Assessment: Purulent ulcer with osteomyelitis involving the third and fourth metatarsal heads with ulceration abscess and necrosis to the little toe.  Plan: Discussed with the patient that she has been on a strong antibiotic and with the persistent purulent drainage she would require at minimum a transmetatarsal amputation due to involvement of the third and fourth metatarsal heads and involvement of the fifth toe.. Patient states that she does not feel that she has been on IV antibiotics she  states she would like to try antibiotic solution longer. I showed her the bag of Rocephin and the fact that she has been on antibiotics during her hospital stay patient states that she would like to hold off on surgery at this time. I discussed that she is at risk of the infection progressing up her foot and at risk of loss of her foot if the infection progresses more  proximally. Patient states she understands. I will make her nothing by mouth after midnight tonight and discuss this with her again tomorrow morning. Possible transmetatarsal amputation Saturday morning.  Derrian Rodak V 01/25/2015, 2:47 PM

## 2015-01-25 NOTE — Progress Notes (Signed)
Standing outside pt's room and heard a loud thud. Upon entering pt's room found pt on the floor stating she had fallen trying to ambulate to the bathroom. Pt did hit head on the back of door but denies LOC or any pain/headache. Obtained vitals and notified MD and received orders for a head CT. Will continue to monitor.     01/25/15 0743  What Happened  Was fall witnessed? No  Was patient injured? No  Patient found on floor  Found by Staff-comment  Follow Up  MD notified Dr. Ree Kida  Time MD notified [phone]  Additional tests Yes-comment (Head CT)  Adult Fall Risk Assessment  Risk Factor Category (scoring not indicated) Not Applicable  Age 49  Fall History: Fall within 6 months prior to admission 0  Elimination; Bowel and/or Urine Incontinence 0  Elimination; Bowel and/or Urine Urgency/Frequency 0  Medications: includes PCA/Opiates, Anti-convulsants, Anti-hypertensives, Diuretics, Hypnotics, Laxatives, Sedatives, and Psychotropics 5  Patient Care Equipment 1  Mobility-Assistance 2  Mobility-Gait 2  Mobility-Sensory Deficit 0  Cognition-Awareness 0  Cognition-Impulsiveness 0  Cognition-Limitations 4  Total Score 14  Patient's Fall Risk High Fall Risk (>13 points)  Adult Fall Risk Interventions  Required Bundle Interventions *See Row Information* High fall risk - low, moderate, and high requirements implemented  Additional Interventions Fall risk signage;Individualized elimination schedule;PT/OT need assessed if change in mobility from baseline;Secure all tubes/drains;Use of appropriate toileting equipment (bedpan, BSC, etc.);Other (Comment) (camera room)  Fall with Injury Screening  Risk For Fall Injury- See Row Information  Nurse judgement  Vitals  Temp 99.7 F (37.6 C)  Temp Source Oral  BP (!) 84/47 mmHg  BP Location Left Arm  BP Method Automatic  Patient Position (if appropriate) Sitting  Pulse Rate 94  Pulse Rate Source Dinamap  Resp 16  Oxygen Therapy  SpO2 99 %  O2  Device Room Air  Neurological  Neuro (WDL) X  Level of Consciousness Alert  Orientation Level Oriented X4  Cognition Appropriate at baseline;Poor judgement;Poor safety awareness  Speech Clear

## 2015-01-25 NOTE — Progress Notes (Signed)
   01/25/15 1622  Clinical Encounter Type  Visited With Patient;Health care provider  Visit Type Initial;Pre-op  Referral From Nurse   Chaplain responded to a request from the nurse to visit the patient who is facing amputation of her left toes tomorrow. Chaplain checked in and offered support. Patient seems to be fine and is taking things in stride. Patient requested that a chaplain come and visit with the patient's mother tomorrow, as the patient's father also died this year and the mother is having a difficult time. Chaplain will pass along the request to weekend chaplain, and our support is available as needed.  Jeri Lager, Chaplain 01/25/2015  4:25 PM

## 2015-01-25 NOTE — Progress Notes (Signed)
Hypoglycemic Event  CBG: 38  Treatment: 15 GM carbohydrate snack  Symptoms: Sweaty and Shaky  Follow-up CBG: Time:2301 CBG Result:77  Possible Reasons for Event: Inadequate meal intake and Change in activity  Comments/MD notified:Laura Harduk, NP    Charlynn Grimes

## 2015-01-25 NOTE — Progress Notes (Signed)
[facility]                                                                              Patient Demographics  Holly [name], is a 49 y.o. female, DOB - 10/06/1965, ZB:[id number]  Admit date - 01/24/2015   Admitting Physician Oswald Hillock, MD  Outpatient Primary MD for the patient is [name], MD  LOS - 1   Chief Complaint  Patient presents with  . Foot Pain      HPI on 01/24/2015 by [name], NP 49 year female patient with known diabetes with significant peripheral neuropathy, advanced chronic kidney disease stage IV with recent placement of AV fistula on 12/13, anemia of chronic kidney disease, hypertension, chronic diastolic heart failure and diabetes on insulin. Patient presented to the ED with complaints of foot pain with recent fevers and chills associated with a foul-smell. Patient reports she has recently been utilizing a compressive type dressing to the left foot as prescribed by her podiatrist. About 3 days ago she began noticing pain in the foot and suspected she may have placed the dressing too tightly. When the dressing was removed she noticed swelling and discoloration of the left foot with redness as well as with maceration of the skin around the left pinky toe. Over the past 2 days she has noticed episodic fevers and chills with a MAXIMUM TEMPERATURE of 100.5, poor oral intake with nausea and vomiting. Because of her poor intake she did not utilize her typical insulin therapies. In the past 24 hours she's noticed increasing pain and now a foul odor. She was unable to get an appointment with her podiatrist today so she presented to the ER for treatment.  Assessment & Plan   Sepsis secondary to Severe purulent cellulitis of the left foot -Upon admission, patient had mild tachycardia, leukocytosis -Orthopedics, Dr. Sharol Given, consulted and appreciated -Continue antibiotics and pain control -Possible surgery 01/26/2015  Diabetes mellitus, type II,  stable -Hemoglobin A1c 7.6 on 01/24/2015 -Patient reports being more compliant with insulin regimen at home or checking her sugars. -Continue Levemir and Cogentin along with sliding scale and CBG monitoring  Chronic kidney disease, stage V -AV fistula placed 01/08/2015 -ACE inhibitor held -Patient had mild dehydration upon admission likely secondary to infectious process -Continue to monitor closely -creatinine 4.92  Essential hypertension -BP has been soft -meds held, continue to monitor  Thrombocytopenia -Possibly secondary to infection and hypoperfusion -Resolved, platelets 183 today  Chronic diastolic heart failure -ACE inhibitor held -Appears to be currently compensated continue to monitor intake and output, daily weights  Neurogenic bladder/urinary retention -patient states she has not urinated  -?urinary retention vs dehydration -Bladder scan ordered  Normocytic anemia/Anemia of chronic disease -baseline Hb 11, currently 9.2 (likely dilutional) -Continue to monitor CBC  Code Status: Full  Family Communication: None at bedside  Disposition Plan: Admitted  Time Spent in minutes   30 minutes  Procedures  None  Consults   Orthopedic surgery, Dr. Sharol Given  DVT Prophylaxis  Heparin  Lab Results  Component Value Date   PLT 183 01/25/2015    Medications  Scheduled Meds: . calcitRIOL  0.25 mcg Oral QODAY  . calcium acetate  667 mg Oral BID WC  . cefTAZidime (FORTAZ)  IV  2 g Intravenous Q24H  . felodipine  5 mg Oral QHS  . ferrous sulfate  325 mg Oral TID WC  . FLUoxetine  20 mg Oral q morning - 10a  . heparin  5,000 Units Subcutaneous Q8H  . insulin aspart  0-15 Units Subcutaneous TID WC  . insulin aspart  0-5 Units Subcutaneous QHS  . insulin detemir  13 Units Subcutaneous BID  . linagliptin  5 mg Oral Daily  . sodium bicarbonate  1,300 mg Oral BID  . [START ON 01/26/2015] vancomycin  1,000 mg Intravenous Q48H   Continuous Infusions: . sodium  chloride 50 mL/hr at 01/24/15 1733   PRN Meds:.acetaminophen **OR** acetaminophen, HYDROmorphone (DILAUDID) injection, ondansetron **OR** ondansetron (ZOFRAN) IV, oxyCODONE  Antibiotics    Anti-infectives    Start     Dose/Rate Route Frequency Ordered Stop   01/26/15 1400  vancomycin (VANCOCIN) IVPB 1000 mg/200 mL premix     1,000 mg 200 mL/hr over 60 Minutes Intravenous Every 48 hours 01/24/15 1343     01/24/15 1400  cefTAZidime (FORTAZ) 2 g in dextrose 5 % 50 mL IVPB     2 g 100 mL/hr over 30 Minutes Intravenous Every 24 hours 01/24/15 1346     01/24/15 1330  vancomycin (VANCOCIN) 1,500 mg in sodium chloride 0.9 % 500 mL IVPB     1,500 mg 250 mL/hr over 120 Minutes Intravenous  Once 01/24/15 1257 01/24/15 1603      Subjective:   Holly [name] seen and examined today.  Patient is worried about having to have an amputation.  She denies chest pain, shortness of breath, abdominal pain, headache or dizziness.  Objective:   Filed Vitals:   01/25/15 0745 01/25/15 0748 01/25/15 0837 01/25/15 1221  BP: 83/49 104/53 104/52 115/44  Pulse:   86 90  Temp:   100.1 F (37.8 C) 99.6 F (37.6 C)  TempSrc:   Oral Oral  Resp:   16 16  Weight:      SpO2:   94% 96%    Wt Readings from Last 3 Encounters:  01/25/15 80.287 kg (177 lb)  01/08/15 77.111 kg (170 lb)  11/07/14 78.019 kg (172 lb)     Intake/Output Summary (Last 24 hours) at 01/25/15 1311 Last data filed at 01/25/15 1031  Gross per 24 hour  Intake    240 ml  Output    625 ml  Net   -385 ml    Exam  General: Well developed, well nourished, NAD, appears stated age  62: NCAT,mucous membranes moist.   Cardiovascular: S1 S2 auscultated, no rubs, murmurs or gallops. Regular rate and rhythm.  Respiratory: Clear to auscultation bilaterally with equal chest rise  Abdomen: Soft, nontender, nondistended, + bowel sounds  Extremities: warm dry without cyanosis clubbing or edema  Neuro: AAOx3, nonfocal  Skin: Left foot  edema/erythema around the 5th toe, plantar ulcer, +odor  Psych: Normal affect and demeanor   Data Review   Micro Results Recent Results (from the past 240 hour(s))  Culture, blood (routine x 2)     Status: None (Preliminary result)   Collection Time: 01/24/15 11:10 AM  Result Value Ref Range Status   Specimen Description BLOOD LEFT HAND  Final   Special Requests BOTTLES DRAWN AEROBIC AND ANAEROBIC 5CC  Final   Culture NO GROWTH < 24 HOURS  Final   Report Status PENDING  Incomplete  Culture, blood (routine x 2)  Status: None (Preliminary result)   Collection Time: 01/24/15  1:38 PM  Result Value Ref Range Status   Specimen Description BLOOD LEFT HAND  Final   Special Requests BOTTLES DRAWN AEROBIC AND ANAEROBIC 6CCS  Final   Culture NO GROWTH < 24 HOURS  Final   Report Status PENDING  Incomplete  Wound culture     Status: None (Preliminary result)   Collection Time: 01/24/15  2:05 PM  Result Value Ref Range Status   Specimen Description WOUND  Final   Special Requests LEFT 5TH TOE  Final   Gram Stain   Final    NO WBC SEEN RARE SQUAMOUS EPITHELIAL CELLS PRESENT FEW GRAM POSITIVE COCCI IN PAIRS RARE GRAM NEGATIVE RODS Performed at [facility]    Culture PENDING  Incomplete   Report Status PENDING  Incomplete    Radiology Reports Dg Foot Complete Left  01/24/2015  CLINICAL DATA:  Soft tissue swelling with wounds laterally. EXAM: LEFT FOOT - COMPLETE 3+ VIEW COMPARISON:  December 18, 2014 FINDINGS: Frontal, oblique, and lateral views were obtained. There is extensive soft tissue air laterally in the regions overlying the fifth metatarsal, fifth proximal phalanx, and fifth distal phalanx. There is also soft tissue air in the lateral aspect of the proximal fourth digit. There is diffuse soft tissue swelling, primarily in the midfoot and forefoot regions. Air is seen the volar to the fourth MTP joint as well. There is no overt bony destruction. No erosive change. No  fracture or dislocation. No appreciable joint space narrowing. There is a spur arising from the inferior calcaneus. IMPRESSION: Extensive soft tissue swelling with soft tissue air laterally, primarily involving the soft tissue surrounding the fifth metatarsal and fifth phalanges, but also with soft tissue air volar to the fourth MTP joint and lateral to the fourth proximal and middle phalanges. This air is consistent with infection with possibility of gas gangrene. No overt osteomyelitis is appreciable. No fracture or dislocation. No erosive change. There is a small calcaneal spur inferiorly. Electronically Signed   By: Lowella Grip III M.D.   On: 01/24/2015 12:03    CBC  Recent Labs Lab 01/22/15 1240 01/24/15 1110 01/25/15 0620  WBC  --  10.7* 14.9*  HGB 11.3* 16.3* 9.2*  HCT  --  48.9* 29.3*  PLT  --  126* 183  MCV  --  90.9 91.3  MCH  --  30.3 28.7  MCHC  --  33.3 31.4  RDW  --  13.1 13.2  LYMPHSABS  --  0.4*  --   MONOABS  --  0.8  --   EOSABS  --  0.0  --   BASOSABS  --  0.0  --     Chemistries   Recent Labs Lab 01/24/15 1110 01/25/15 0620  NA 132* 133*  K 4.1 4.2  CL 95* 101  CO2 22 22  GLUCOSE 287* 116*  BUN 71* 76*  CREATININE 4.34* 4.92*  CALCIUM 9.3 8.3*  AST 12* 14*  ALT 12* 7*  ALKPHOS 78 64  BILITOT 0.9 0.7   ------------------------------------------------------------------------------------------------------------------ estimated creatinine clearance is 14.6 mL/min (by C-G formula based on Cr of 4.92). ------------------------------------------------------------------------------------------------------------------  Recent Labs  01/24/15 1110  HGBA1C 7.6*   ------------------------------------------------------------------------------------------------------------------ No results for input(s): CHOL, HDL, LDLCALC, TRIG, CHOLHDL, LDLDIRECT in the last 72  hours. ------------------------------------------------------------------------------------------------------------------ No results for input(s): TSH, T4TOTAL, T3FREE, THYROIDAB in the last 72 hours.  Invalid input(s): FREET3 ------------------------------------------------------------------------------------------------------------------ No results for input(s): VITAMINB12, FOLATE, FERRITIN, TIBC,  IRON, RETICCTPCT in the last 72 hours.  Coagulation profile No results for input(s): INR, PROTIME in the last 168 hours.  No results for input(s): DDIMER in the last 72 hours.  Cardiac Enzymes No results for input(s): CKMB, TROPONINI, MYOGLOBIN in the last 168 hours.  Invalid input(s): CK ------------------------------------------------------------------------------------------------------------------ Invalid input(s): POCBNP    Eiley Mcginnity D.O. on 01/25/2015 at 1:11 PM  Between 7am to 7pm - Pager - [phone]  After 7pm go to [email or web address] - password [email or web address]  And look for the night coverage person covering for me after hours  [facility] Office  [phone]

## 2015-01-25 NOTE — Progress Notes (Signed)
Hypoglycemic Event  CBG: 40  Treatment: D50 IV 25 mL  Symptoms: None/Cold  Follow-up CBG: Time:1800 CBG Result:92  Possible Reasons for Event: Inadequate meal intake  Comments/MD notified: Dr. Lenise Herald

## 2015-01-26 ENCOUNTER — Inpatient Hospital Stay (HOSPITAL_COMMUNITY): Payer: Commercial Managed Care - PPO | Admitting: Anesthesiology

## 2015-01-26 ENCOUNTER — Encounter (HOSPITAL_COMMUNITY)
Admission: EM | Disposition: A | Discharge status: Home Health | Payer: Self-pay | Source: Home / Self Care | Attending: Internal Medicine

## 2015-01-26 HISTORY — PX: AMPUTATION: SHX166

## 2015-01-26 LAB — CBC
HCT: 30.9 % — ABNORMAL LOW (ref 36.0–46.0)
Hemoglobin: 10.4 g/dL — ABNORMAL LOW (ref 12.0–15.0)
MCH: 29.8 pg (ref 26.0–34.0)
MCHC: 33.7 g/dL (ref 30.0–36.0)
MCV: 88.5 fL (ref 78.0–100.0)
PLATELETS: 217 10*3/uL (ref 150–400)
RBC: 3.49 MIL/uL — AB (ref 3.87–5.11)
RDW: 13.4 % (ref 11.5–15.5)
WBC: 20.7 10*3/uL — AB (ref 4.0–10.5)

## 2015-01-26 LAB — URINE MICROSCOPIC-ADD ON: RBC / HPF: NONE SEEN RBC/hpf (ref 0–5)

## 2015-01-26 LAB — GLUCOSE, CAPILLARY
GLUCOSE-CAPILLARY: 109 mg/dL — AB (ref 65–99)
GLUCOSE-CAPILLARY: 240 mg/dL — AB (ref 65–99)
Glucose-Capillary: 75 mg/dL (ref 65–99)
Glucose-Capillary: 118 mg/dL — ABNORMAL HIGH (ref 65–99)
Glucose-Capillary: 135 mg/dL — ABNORMAL HIGH (ref 65–99)
Glucose-Capillary: 255 mg/dL — ABNORMAL HIGH (ref 65–99)
Glucose-Capillary: 218 mg/dL — ABNORMAL HIGH (ref 65–99)

## 2015-01-26 LAB — BASIC METABOLIC PANEL
ANION GAP: 14 (ref 5–15)
BUN: 79 mg/dL — AB (ref 6–20)
CO2: 18 mmol/L — ABNORMAL LOW (ref 22–32)
Calcium: 8.5 mg/dL — ABNORMAL LOW (ref 8.9–10.3)
Chloride: 98 mmol/L — ABNORMAL LOW (ref 101–111)
Creatinine, Ser: 5.19 mg/dL — ABNORMAL HIGH (ref 0.44–1.00)
GFR calc Af Amer: 10 mL/min — ABNORMAL LOW (ref >60)
GFR, EST NON AFRICAN AMERICAN: 9 mL/min — AB (ref >60)
Glucose, Bld: 96 mg/dL (ref 65–99)
POTASSIUM: 4.9 mmol/L (ref 3.5–5.1)
SODIUM: 130 mmol/L — AB (ref 135–145)

## 2015-01-26 LAB — URINALYSIS, ROUTINE W REFLEX MICROSCOPIC
Bilirubin Urine: NEGATIVE
Glucose, UA: NEGATIVE mg/dL
Hgb urine dipstick: NEGATIVE
KETONES UR: 15 mg/dL — AB
LEUKOCYTES UA: NEGATIVE
NITRITE: NEGATIVE
PH: 5 (ref 5.0–8.0)
Protein, ur: 100 mg/dL — AB
SPECIFIC GRAVITY, URINE: 1.017 (ref 1.005–1.030)

## 2015-01-26 SURGERY — AMPUTATION, FOOT, PARTIAL
Anesthesia: Regional | Laterality: Left

## 2015-01-26 MED ORDER — MIDAZOLAM HCL 2 MG/2ML IJ SOLN
INTRAMUSCULAR | Status: AC
  Filled 2015-01-26: qty 2

## 2015-01-26 MED ORDER — PROMETHAZINE HCL 25 MG/ML IJ SOLN: 6.2500 mg | INTRAMUSCULAR | Status: DC | PRN

## 2015-01-26 MED ORDER — SODIUM CHLORIDE 0.9 % IV SOLN
INTRAVENOUS | Status: DC
  Administered 2015-01-26: 15:00:00 via INTRAVENOUS

## 2015-01-26 MED ORDER — HYDROMORPHONE HCL 1 MG/ML IJ SOLN
1.0000 mg | INTRAMUSCULAR | Status: DC | PRN
  Administered 2015-01-26 – 2015-02-02 (×7): 1 mg via INTRAVENOUS
  Filled 2015-01-26 (×7): qty 1

## 2015-01-26 MED ORDER — SODIUM CHLORIDE 0.9 % IV SOLN
INTRAVENOUS | Status: DC | PRN
Start: 2015-01-26 — End: 2015-01-26
  Administered 2015-01-26: 09:00:00 via INTRAVENOUS

## 2015-01-26 MED ORDER — 0.9 % SODIUM CHLORIDE (POUR BTL) OPTIME
TOPICAL | Status: DC | PRN
  Administered 2015-01-26: 1000 mL

## 2015-01-26 MED ORDER — METHOCARBAMOL 500 MG PO TABS
500.0000 mg | ORAL_TABLET | Freq: Four times a day (QID) | ORAL | Status: DC | PRN
  Administered 2015-01-27 – 2015-02-02 (×7): 500 mg via ORAL
  Filled 2015-01-26 (×7): qty 1

## 2015-01-26 MED ORDER — FENTANYL CITRATE (PF) 250 MCG/5ML IJ SOLN
INTRAMUSCULAR | Status: AC
  Filled 2015-01-26: qty 5

## 2015-01-26 MED ORDER — METOCLOPRAMIDE HCL 5 MG/ML IJ SOLN: 5.0000 mg | Freq: Three times a day (TID) | INTRAMUSCULAR | Status: DC | PRN

## 2015-01-26 MED ORDER — PHENYLEPHRINE 40 MCG/ML (10ML) SYRINGE FOR IV PUSH (FOR BLOOD PRESSURE SUPPORT)
PREFILLED_SYRINGE | INTRAVENOUS | Status: AC
  Filled 2015-01-26: qty 10

## 2015-01-26 MED ORDER — ONDANSETRON HCL 4 MG PO TABS
4.0000 mg | ORAL_TABLET | Freq: Four times a day (QID) | ORAL | Status: DC | PRN
  Administered 2015-01-29 – 2015-01-30 (×4): 4 mg via ORAL
  Filled 2015-01-26 (×4): qty 1

## 2015-01-26 MED ORDER — ONDANSETRON HCL 4 MG/2ML IJ SOLN
INTRAMUSCULAR | Status: AC
  Filled 2015-01-26: qty 2

## 2015-01-26 MED ORDER — PROPOFOL 10 MG/ML IV BOLUS
INTRAVENOUS | Status: AC
  Filled 2015-01-26: qty 20

## 2015-01-26 MED ORDER — PHENYLEPHRINE HCL 10 MG/ML IJ SOLN
INTRAMUSCULAR | Status: DC | PRN
  Administered 2015-01-26: 80 ug via INTRAVENOUS

## 2015-01-26 MED ORDER — FENTANYL CITRATE (PF) 250 MCG/5ML IJ SOLN
INTRAMUSCULAR | Status: DC | PRN
  Administered 2015-01-26 (×2): 50 ug via INTRAVENOUS

## 2015-01-26 MED ORDER — MIDAZOLAM HCL 2 MG/2ML IJ SOLN
INTRAMUSCULAR | Status: DC | PRN
  Administered 2015-01-26: 2 mg via INTRAVENOUS

## 2015-01-26 MED ORDER — LIDOCAINE HCL (CARDIAC) 20 MG/ML IV SOLN
INTRAVENOUS | Status: AC
  Filled 2015-01-26: qty 5

## 2015-01-26 MED ORDER — ACETAMINOPHEN 650 MG RE SUPP: 650.0000 mg | Freq: Four times a day (QID) | RECTAL | Status: DC | PRN

## 2015-01-26 MED ORDER — ONDANSETRON HCL 4 MG/2ML IJ SOLN
4.0000 mg | Freq: Four times a day (QID) | INTRAMUSCULAR | Status: DC | PRN
  Filled 2015-01-26: qty 2

## 2015-01-26 MED ORDER — ONDANSETRON HCL 4 MG/2ML IJ SOLN
INTRAMUSCULAR | Status: DC | PRN
  Administered 2015-01-26: 4 mg via INTRAVENOUS

## 2015-01-26 MED ORDER — HYDROMORPHONE HCL 1 MG/ML IJ SOLN: 0.2500 mg | INTRAMUSCULAR | Status: DC | PRN

## 2015-01-26 MED ORDER — OXYCODONE HCL 5 MG/5ML PO SOLN: 5.0000 mg | Freq: Once | ORAL | Status: DC | PRN

## 2015-01-26 MED ORDER — METOCLOPRAMIDE HCL 5 MG PO TABS
5.0000 mg | ORAL_TABLET | Freq: Three times a day (TID) | ORAL | Status: DC | PRN
  Administered 2015-02-01: 10 mg via ORAL
  Filled 2015-01-26: qty 2

## 2015-01-26 MED ORDER — OXYCODONE HCL 5 MG PO TABS: 5.0000 mg | ORAL_TABLET | Freq: Once | ORAL | Status: DC | PRN

## 2015-01-26 MED ORDER — METHOCARBAMOL 1000 MG/10ML IJ SOLN
500.0000 mg | Freq: Four times a day (QID) | INTRAVENOUS | Status: DC | PRN
  Filled 2015-01-26: qty 5

## 2015-01-26 MED ORDER — ACETAMINOPHEN 325 MG PO TABS: 650.0000 mg | ORAL_TABLET | Freq: Four times a day (QID) | ORAL | Status: DC | PRN

## 2015-01-26 MED ORDER — PROPOFOL 10 MG/ML IV BOLUS
INTRAVENOUS | Status: DC | PRN
  Administered 2015-01-26: 150 mg via INTRAVENOUS

## 2015-01-26 SURGICAL SUPPLY — 29 items
BLADE SAW SGTL HD 18.5X60.5X1. (BLADE) ×2 IMPLANT
BLADE SURG 10 STRL SS (BLADE) IMPLANT
BNDG COHESIVE 4X5 TAN STRL (GAUZE/BANDAGES/DRESSINGS) ×2 IMPLANT
BNDG GAUZE ELAST 4 BULKY (GAUZE/BANDAGES/DRESSINGS) ×2 IMPLANT
COVER SURGICAL LIGHT HANDLE (MISCELLANEOUS) ×2 IMPLANT
DRAPE U-SHAPE 47X51 STRL (DRAPES) ×2 IMPLANT
DRSG ADAPTIC 3X8 NADH LF (GAUZE/BANDAGES/DRESSINGS) ×2 IMPLANT
DRSG PAD ABDOMINAL 8X10 ST (GAUZE/BANDAGES/DRESSINGS) ×4 IMPLANT
DURAPREP 26ML APPLICATOR (WOUND CARE) ×2 IMPLANT
ELECT REM PT RETURN 9FT ADLT (ELECTROSURGICAL) ×2
ELECTRODE REM PT RTRN 9FT ADLT (ELECTROSURGICAL) ×1 IMPLANT
GAUZE SPONGE 4X4 12PLY STRL (GAUZE/BANDAGES/DRESSINGS) ×2 IMPLANT
GLOVE BIOGEL PI IND STRL 9 (GLOVE) ×1 IMPLANT
GLOVE BIOGEL PI INDICATOR 9 (GLOVE) ×1
GLOVE SURG ORTHO 9.0 STRL STRW (GLOVE) ×2 IMPLANT
GOWN STRL REUS W/ TWL XL LVL3 (GOWN DISPOSABLE) ×1 IMPLANT
GOWN STRL REUS W/TWL XL LVL3 (GOWN DISPOSABLE) ×1
KIT BASIN OR (CUSTOM PROCEDURE TRAY) ×2 IMPLANT
KIT ROOM TURNOVER OR (KITS) ×2 IMPLANT
NS IRRIG 1000ML POUR BTL (IV SOLUTION) ×2 IMPLANT
PACK ORTHO EXTREMITY (CUSTOM PROCEDURE TRAY) ×2 IMPLANT
PAD ARMBOARD 7.5X6 YLW CONV (MISCELLANEOUS) IMPLANT
SPONGE GAUZE 4X4 12PLY STER LF (GAUZE/BANDAGES/DRESSINGS) ×2 IMPLANT
SPONGE LAP 18X18 X RAY DECT (DISPOSABLE) ×2 IMPLANT
SUT ETHILON 2 0 PSLX (SUTURE) ×4 IMPLANT
SUT VIC AB 2-0 CTB1 (SUTURE) IMPLANT
TOWEL OR 17X24 6PK STRL BLUE (TOWEL DISPOSABLE) ×2 IMPLANT
TOWEL OR 17X26 10 PK STRL BLUE (TOWEL DISPOSABLE) ×2 IMPLANT
WATER STERILE IRR 1000ML POUR (IV SOLUTION) ×2 IMPLANT

## 2015-01-26 NOTE — Interval H&P Note (Signed)
History and Physical Interval Note:  01/26/2015 9:12 AM  Vilinda Blanks Karman  has presented today for surgery, with the diagnosis of left foot abscess  The various methods of treatment have been discussed with the patient and family. After consideration of risks, benefits and other options for treatment, the patient has consented to  Procedure(s): POST TRANS MET  (Left) as a surgical intervention .  The patient's history has been reviewed, patient examined, no change in status, stable for surgery.  I have reviewed the patient's chart and labs.  Questions were answered to the patient's satisfaction.     DUDA,MARCUS V

## 2015-01-26 NOTE — Anesthesia Procedure Notes (Signed)
Procedure Name: LMA Insertion Date/Time: 01/26/2015 9:41 AM Performed by: Marinda Elk A Pre-anesthesia Checklist: Patient identified, Emergency Drugs available, Suction available, Patient being monitored and Timeout performed Patient Re-evaluated:Patient Re-evaluated prior to inductionOxygen Delivery Method: Circle system utilized Preoxygenation: Pre-oxygenation with 100% oxygen Intubation Type: IV induction Ventilation: Mask ventilation without difficulty LMA: LMA inserted LMA Size: 4.0 Number of attempts: 1 Placement Confirmation: positive ETCO2 and breath sounds checked- equal and bilateral Tube secured with: Tape Dental Injury: Teeth and Oropharynx as per pre-operative assessment

## 2015-01-26 NOTE — Op Note (Signed)
01/24/2015 - 01/26/2015  10:09 AM  PATIENT:  Holly [name]    PRE-OPERATIVE DIAGNOSIS:  left foot abscess  POST-OPERATIVE DIAGNOSIS:  Same  PROCEDURE:  Transmetatarsal amputation left foot  SURGEON:  Newt Minion, MD  PHYSICIAN ASSISTANT:None ANESTHESIA:   General  PREOPERATIVE INDICATIONS:  Holly [name] is a  49 y.o. female with a diagnosis of left foot abscess who failed conservative measures and elected for surgical management.    The risks benefits and alternatives were discussed with the patient preoperatively including but not limited to the risks of infection, bleeding, nerve injury, cardiopulmonary complications, the need for revision surgery, among others, and the patient was willing to proceed.  OPERATIVE IMPLANTS: None  OPERATIVE FINDINGS: Deep cultures obtained of a purulent abscess. The abscess from the forefoot extended proximal to the ankle.  OPERATIVE PROCEDURE: Patient was brought to the operating room and underwent a general anesthetic. After adequate levels anesthesia obtained patient's left lower extremity was prepped using DuraPrep draped into a sterile field a timeout was called. A fishmouth incision was made through the forefoot. A transmetatarsal amputation was performed with an oscillating saw. Patient had necrotic skin and fascial tissue with an abscess that extended through the tissue planes proximal to the ankle. This did not extend intra-articular. The skin incision was then resected proximally another centimeter to get back to healthy viable skin. The deep abscess was decompressed of infected fascia with a Ronjair. The wound was irrigated with normal saline. There is no signs of necrotizing fasciitis. There is no signs of involvement of the ankle joint. After irrigation debridement there was good petechial bleeding. The incision was closed using 2-0 nylon there was not tension on the skin. A sterile compressive dressing was applied patient was extubated  taken to the PACU in stable condition.

## 2015-01-26 NOTE — Transfer of Care (Signed)
Immediate Anesthesia Transfer of Care Note  Patient: Holly [name]  Procedure(s) Performed: Procedure(s): POST TRANS MET  (Left)  Patient Location: PACU  Anesthesia Type:General  Level of Consciousness: awake  Airway & Oxygen Therapy: Patient Spontanous Breathing and Patient connected to nasal cannula oxygen  Post-op Assessment: Report given to RN and Post -op Vital signs reviewed and stable  Post vital signs: Reviewed and stable  Last Vitals:  Filed Vitals:   01/25/15 2231 01/26/15 0439  BP: 144/64 113/55  Pulse: 102 75  Temp: 39.4 C 36.7 C  Resp: 16 18    Complications: No apparent anesthesia complications

## 2015-01-26 NOTE — Progress Notes (Addendum)
[facility]                                                                              Patient Demographics  Holly [name], is a 49 y.o. female, DOB - 03-12-1965, [id number]  Admit date - 01/24/2015   Admitting Physician Oswald Hillock, MD  Outpatient Primary MD for the patient is [name], MD  LOS - 2   Chief Complaint  Patient presents with  . Foot Pain      HPI on 01/24/2015 by [name], NP 49 year female patient with known diabetes with significant peripheral neuropathy, advanced chronic kidney disease stage IV with recent placement of AV fistula on 12/13, anemia of chronic kidney disease, hypertension, chronic diastolic heart failure and diabetes on insulin. Patient presented to the ED with complaints of foot pain with recent fevers and chills associated with a foul-smell. Patient reports she has recently been utilizing a compressive type dressing to the left foot as prescribed by her podiatrist. About 3 days ago she began noticing pain in the foot and suspected she may have placed the dressing too tightly. When the dressing was removed she noticed swelling and discoloration of the left foot with redness as well as with maceration of the skin around the left pinky toe. Over the past 2 days she has noticed episodic fevers and chills with a MAXIMUM TEMPERATURE of 100.5, poor oral intake with nausea and vomiting. Because of her poor intake she did not utilize her typical insulin therapies. In the past 24 hours she's noticed increasing pain and now a foul odor. She was unable to get an appointment with her podiatrist today so she presented to the ER for treatment.  Assessment & Plan   Sepsis secondary to Severe purulent cellulitis of the left foot -Upon admission, patient had mild tachycardia, leukocytosis -Orthopedics, Dr. Sharol Given, consulted and appreciated, s/p left transmetatarsal amputation -Continue antibiotics-vanc and fortaz, and pain control -PT to be  consulted 01/27/2015 -Blood cultures negative to date -Wound culture pending  Diabetes mellitus, type II, stable -Hemoglobin A1c 7.6 on 01/24/2015 -Patient reports being more compliant with insulin regimen at home or checking her sugars. -Continue Levemir and Cogentin along with sliding scale and CBG monitoring  Chronic kidney disease, stage V -AV fistula placed 01/08/2015 -ACE inhibitor held -Patient had mild dehydration upon admission likely secondary to infectious process -Continue to monitor closely -creatinine 5.19  Essential hypertension -BP has been soft -meds held, continue to monitor  Thrombocytopenia -Possibly secondary to infection and hypoperfusion -Resolved, platelets 183 today  Chronic diastolic heart failure -ACE inhibitor held -Appears to be currently compensated continue to monitor intake and output, daily weights  Neurogenic bladder/urinary retention -patient states she has not urinated  -?urinary retention vs dehydration -Bladder scan ordered  Normocytic anemia/Anemia of chronic disease -baseline Hb 11, currently 10.4 -Continue to monitor CBC  Code Status: Full  Family Communication: Mom at bedside  Disposition Plan: Admitted  Time Spent in minutes   30 minutes  Procedures  None  Consults   Orthopedic surgery, Dr. Sharol Given  DVT Prophylaxis  Heparin  Lab Results  Component Value Date   PLT 217 01/26/2015    Medications  Scheduled Meds: . calcitRIOL  0.25 mcg Oral QODAY  . calcium acetate  667 mg Oral BID WC  . cefTAZidime (FORTAZ)  IV  2 g Intravenous Q24H  . ferrous sulfate  325 mg Oral TID WC  . FLUoxetine  20 mg Oral q morning - 10a  . heparin  5,000 Units Subcutaneous Q8H  . insulin aspart  0-15 Units Subcutaneous TID WC  . insulin aspart  0-5 Units Subcutaneous QHS  . sodium bicarbonate  1,300 mg Oral BID  . vancomycin  1,000 mg Intravenous Q48H   Continuous Infusions: . sodium chloride    . dextrose 5 % and 0.45% NaCl 75  mL/hr at 01/26/15 0046   PRN Meds:.acetaminophen **OR** acetaminophen, HYDROmorphone (DILAUDID) injection, methocarbamol **OR** methocarbamol (ROBAXIN)  IV, metoCLOPramide **OR** metoCLOPramide (REGLAN) injection, ondansetron **OR** ondansetron (ZOFRAN) IV, oxyCODONE  Antibiotics    Anti-infectives    Start     Dose/Rate Route Frequency Ordered Stop   01/26/15 1400  vancomycin (VANCOCIN) IVPB 1000 mg/200 mL premix     1,000 mg 200 mL/hr over 60 Minutes Intravenous Every 48 hours 01/24/15 1343     01/24/15 1400  cefTAZidime (FORTAZ) 2 g in dextrose 5 % 50 mL IVPB     2 g 100 mL/hr over 30 Minutes Intravenous Every 24 hours 01/24/15 1346     01/24/15 1330  vancomycin (VANCOCIN) 1,500 mg in sodium chloride 0.9 % 500 mL IVPB     1,500 mg 250 mL/hr over 120 Minutes Intravenous  Once 01/24/15 1257 01/24/15 1603      Subjective:   Holly [name] seen and examined today after surgery.  Patient is worried that she is not receiving antibiotics.  She states she is not in pain yet. She denies chest pain, shortness of breath, abdominal pain, headache or dizziness.  Objective:   Filed Vitals:   01/26/15 1030 01/26/15 1045 01/26/15 1100 01/26/15 1129  BP: 111/56 113/59 122/57 104/33  Pulse: 74 75 77   Temp:  98.1 F (36.7 C)  98.1 F (36.7 C)  TempSrc:    Oral  Resp: 11 14 13 16   Weight:      SpO2: 94% 95% 95% 96%    Wt Readings from Last 3 Encounters:  01/25/15 80.287 kg (177 lb)  01/08/15 77.111 kg (170 lb)  11/07/14 78.019 kg (172 lb)     Intake/Output Summary (Last 24 hours) at 01/26/15 1353 Last data filed at 01/26/15 1001  Gross per 24 hour  Intake 868.75 ml  Output     70 ml  Net 798.75 ml    Exam  General: Well developed, well nourished, NAD  HEENT: NCAT,mucous membranes moist.   Cardiovascular: S1 S2 auscultated, RRR, no murmurs  Respiratory: Clear to auscultation bilaterally   Abdomen: Soft, nontender, nondistended, + bowel sounds  Extremities: warm dry  without cyanosis clubbing or edema  Neuro: AAOx3, nonfocal  Skin: Left foot wrapped  Psych: Normal affect and demeanor   Data Review   Micro Results Recent Results (from the past 240 hour(s))  Culture, blood (routine x 2)     Status: None (Preliminary result)   Collection Time: 01/24/15 11:10 AM  Result Value Ref Range Status   Specimen Description BLOOD LEFT HAND  Final   Special Requests BOTTLES DRAWN AEROBIC AND ANAEROBIC 5CC  Final   Culture NO GROWTH 2 DAYS  Final   Report Status PENDING  Incomplete  Culture, blood (routine x 2)     Status: None (Preliminary result)  Collection Time: 01/24/15  1:38 PM  Result Value Ref Range Status   Specimen Description BLOOD LEFT HAND  Final   Special Requests BOTTLES DRAWN AEROBIC AND ANAEROBIC 6CCS  Final   Culture NO GROWTH 2 DAYS  Final   Report Status PENDING  Incomplete  Wound culture     Status: None (Preliminary result)   Collection Time: 01/24/15  2:05 PM  Result Value Ref Range Status   Specimen Description WOUND  Final   Special Requests LEFT 5TH TOE  Final   Gram Stain   Final    NO WBC SEEN RARE SQUAMOUS EPITHELIAL CELLS PRESENT FEW GRAM POSITIVE COCCI IN PAIRS RARE GRAM NEGATIVE RODS Performed at [facility]    Culture   Final    Culture reincubated for better growth Performed at [facility]    Report Status PENDING  Incomplete    Radiology Reports Dg Foot Complete Left  01/24/2015  CLINICAL DATA:  Soft tissue swelling with wounds laterally. EXAM: LEFT FOOT - COMPLETE 3+ VIEW COMPARISON:  December 18, 2014 FINDINGS: Frontal, oblique, and lateral views were obtained. There is extensive soft tissue air laterally in the regions overlying the fifth metatarsal, fifth proximal phalanx, and fifth distal phalanx. There is also soft tissue air in the lateral aspect of the proximal fourth digit. There is diffuse soft tissue swelling, primarily in the midfoot and forefoot regions. Air is seen the volar to  the fourth MTP joint as well. There is no overt bony destruction. No erosive change. No fracture or dislocation. No appreciable joint space narrowing. There is a spur arising from the inferior calcaneus. IMPRESSION: Extensive soft tissue swelling with soft tissue air laterally, primarily involving the soft tissue surrounding the fifth metatarsal and fifth phalanges, but also with soft tissue air volar to the fourth MTP joint and lateral to the fourth proximal and middle phalanges. This air is consistent with infection with possibility of gas gangrene. No overt osteomyelitis is appreciable. No fracture or dislocation. No erosive change. There is a small calcaneal spur inferiorly. Electronically Signed   By: Lowella Grip III M.D.   On: 01/24/2015 12:03    CBC  Recent Labs Lab 01/22/15 1240 01/24/15 1110 01/25/15 0620 01/26/15 0707  WBC  --  10.7* 14.9* 20.7*  HGB 11.3* 16.3* 9.2* 10.4*  HCT  --  48.9* 29.3* 30.9*  PLT  --  126* 183 217  MCV  --  90.9 91.3 88.5  MCH  --  30.3 28.7 29.8  MCHC  --  33.3 31.4 33.7  RDW  --  13.1 13.2 13.4  LYMPHSABS  --  0.4*  --   --   MONOABS  --  0.8  --   --   EOSABS  --  0.0  --   --   BASOSABS  --  0.0  --   --     Chemistries   Recent Labs Lab 01/24/15 1110 01/25/15 0620 01/26/15 0707  NA 132* 133* 130*  K 4.1 4.2 4.9  CL 95* 101 98*  CO2 22 22 18*  GLUCOSE 287* 116* 96  BUN 71* 76* 79*  CREATININE 4.34* 4.92* 5.19*  CALCIUM 9.3 8.3* 8.5*  AST 12* 14*  --   ALT 12* 7*  --   ALKPHOS 78 64  --   BILITOT 0.9 0.7  --    ------------------------------------------------------------------------------------------------------------------ estimated creatinine clearance is 13.8 mL/min (by C-G formula based on Cr of 5.19). ------------------------------------------------------------------------------------------------------------------  Recent Labs  01/24/15 1110  HGBA1C 7.6*    ------------------------------------------------------------------------------------------------------------------ No results for input(s): CHOL, HDL, LDLCALC, TRIG, CHOLHDL, LDLDIRECT in the last 72 hours. ------------------------------------------------------------------------------------------------------------------ No results for input(s): TSH, T4TOTAL, T3FREE, THYROIDAB in the last 72 hours.  Invalid input(s): FREET3 ------------------------------------------------------------------------------------------------------------------ No results for input(s): VITAMINB12, FOLATE, FERRITIN, TIBC, IRON, RETICCTPCT in the last 72 hours.  Coagulation profile No results for input(s): INR, PROTIME in the last 168 hours.  No results for input(s): DDIMER in the last 72 hours.  Cardiac Enzymes No results for input(s): CKMB, TROPONINI, MYOGLOBIN in the last 168 hours.  Invalid input(s): CK ------------------------------------------------------------------------------------------------------------------ Invalid input(s): POCBNP    Bama Hanselman D.O. on 01/26/2015 at 1:53 PM  Between 7am to 7pm - Pager - [phone]  After 7pm go to [email or web address] - password [email or web address]  And look for the night coverage person covering for me after hours  [facility] Office  [phone]

## 2015-01-26 NOTE — H&P (View-Only) (Signed)
Reason for Consult: Abscess osteomyelitis left forefoot Referring Physician: Dr. Dorthy Cooler [name] is an 49 y.o. female.  HPI: Patient is a 49 year old woman with diabetic insensate neuropathy peripheral vascular disease who presents with ulceration cellulitis of the left little toe as well as ulceration purulent drainage from the third and fourth metatarsal heads.  Past Medical History  Diagnosis Date  . Bulimia   . Acute cystitis   . Anxiety state, unspecified   . Essential hypertension, benign   . 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 Dysthymic disorder   . Peripheral autonomic neuropathy in disorders classified elsewhere(337.1)   . Diarrhea     symptomatic  . Edema     moderate  . Esophageal reflux   . HLD (hyperlipidemia)     HDL goal >50, LDL goal <100  . Unspecified essential hypertension     Renal Dopplers 12/23/09 at [facility] cardiology in [facility]: No significant renal artery stenosis bilaterally  . Disorders of magnesium metabolism   . Irritable bowel syndrome   . Left heart failure ([place])   . Muscle weakness (generalized)   . Dysuria   . Palpitations   . Primary pulmonary hypertension (HCC)     not seen at CATH  (64m Hg), pt not aware of this  . Neuralgia, neuritis, and radiculitis, unspecified   . Encounter for long-term (current) use of other medications   . Pneumonia 2010  . DM2 (diabetes mellitus, type 2) ([facility]   . Type II or unspecified type diabetes mellitus with other specified manifestations, not stated as uncontrolled     [facility]  . Background diabetic retinopathy(362.01)   . History of blood transfusion X 6-7; 2010 - present (03/29/2014)    "related to OR; kidney issues"  . Iron deficiency anemia     "suppose to get procrit injections q 3 wks; usually don't" do it" (03/29/2014)  . Calculus of kidney   . Chronic kidney disease, stage IV  (severe) ([place]   . Complication of anesthesia     slow to wake up, had lost a lot of blood - 2010  . Heart murmur     been told very slight, never given her any problems  . Cellulitis and abscess of foot 01/24/2015    left foot    Past Surgical History  Procedure Laterality Date  . Tonsillectomy and adenoidectomy    . Cesarean section  2001; 1999; 1994  . Appendectomy    . Dilation and curettage of uterus    . Pars plana vitrectomy Bilateral 2010    "related to DM"  . Eye surgery    . Incision and drainage of wound Right 2010    "serious staph infection"  . Av fistula placement Right 01/08/2015    Procedure: ARTERIOVENOUS (AV) FISTULA CREATION;  Surgeon: CAngelia Mould MD;  [facility];  Service: Vascular;  Laterality: Right;    Family History  Problem Relation Age of Onset  . Hypertension Mother   . Myelodysplastic syndrome Father   . Pancreatic cancer      Social History:  reports that she has never smoked. She has never used smokeless tobacco. She reports that she drinks alcohol. She reports that she does not use illicit drugs.  Allergies:  Allergies  Allergen Reactions  . Bactrim Nausea And Vomiting  . Byetta 10 Mcg Pen [Exenatide] Nausea And Vomiting  . Hctz [Hydrochlorothiazide] Other (See Comments)  dizziness  . Spironolactone Other (See Comments)    Dizziness   . Sulfa Antibiotics Nausea And Vomiting    Medications: I have reviewed the patient's current medications.  Results for orders placed or performed during the hospital encounter of 01/24/15 (from the past 48 hour(s))  CBC with Differential     Status: Abnormal   Collection Time: 01/24/15 11:10 AM  Result Value Ref Range   WBC 10.7 (H) 4.0 - 10.5 K/uL    Comment: REPEATED TO VERIFY   RBC 5.38 (H) 3.87 - 5.11 MIL/uL   Hemoglobin 16.3 (H) 12.0 - 15.0 g/dL    Comment: REPEATED TO VERIFY   HCT 48.9 (H) 36.0 - 46.0 %   MCV 90.9 78.0 - 100.0 fL   MCH 30.3 26.0 - 34.0 pg   MCHC 33.3 30.0  - 36.0 g/dL   RDW 13.1 11.5 - 15.5 %   Platelets 126 (L) 150 - 400 K/uL    Comment: REPEATED TO VERIFY   Neutrophils Relative % 89 %   Neutro Abs 9.5 (H) 1.7 - 7.7 K/uL   Lymphocytes Relative 4 %   Lymphs Abs 0.4 (L) 0.7 - 4.0 K/uL   Monocytes Relative 7 %   Monocytes Absolute 0.8 0.1 - 1.0 K/uL   Eosinophils Relative 0 %   Eosinophils Absolute 0.0 0.0 - 0.7 K/uL   Basophils Relative 0 %   Basophils Absolute 0.0 0.0 - 0.1 K/uL  Comprehensive metabolic panel     Status: Abnormal   Collection Time: 01/24/15 11:10 AM  Result Value Ref Range   Sodium 132 (L) 135 - 145 mmol/L   Potassium 4.1 3.5 - 5.1 mmol/L   Chloride 95 (L) 101 - 111 mmol/L   CO2 22 22 - 32 mmol/L   Glucose, Bld 287 (H) 65 - 99 mg/dL   BUN 71 (H) 6 - 20 mg/dL   Creatinine, Ser 4.34 (H) 0.44 - 1.00 mg/dL   Calcium 9.3 8.9 - 10.3 mg/dL   Total Protein 6.8 6.5 - 8.1 g/dL   Albumin 2.9 (L) 3.5 - 5.0 g/dL   AST 12 (L) 15 - 41 U/L   ALT 12 (L) 14 - 54 U/L   Alkaline Phosphatase 78 38 - 126 U/L   Total Bilirubin 0.9 0.3 - 1.2 mg/dL   GFR calc non Af Amer 11 (L) >60 mL/min   GFR calc Af Amer 13 (L) >60 mL/min    Comment: (NOTE) The eGFR has been calculated using the CKD EPI equation. This calculation has not been validated in all clinical situations. eGFR's persistently <60 mL/min signify possible Chronic Kidney Disease.    Anion gap 15 5 - 15  Culture, blood (routine x 2)     Status: None (Preliminary result)   Collection Time: 01/24/15 11:10 AM  Result Value Ref Range   Specimen Description BLOOD LEFT HAND    Special Requests BOTTLES DRAWN AEROBIC AND ANAEROBIC 5CC    Culture NO GROWTH < 24 HOURS    Report Status PENDING   Hemoglobin A1c     Status: Abnormal   Collection Time: 01/24/15 11:10 AM  Result Value Ref Range   Hgb A1c MFr Bld 7.6 (H) 4.8 - 5.6 %    Comment: (NOTE)         Pre-diabetes: 5.7 - 6.4         Diabetes: >6.4         Glycemic control for adults with diabetes: <7.0    Mean Plasma  Glucose  171 mg/dL    Comment: (NOTE) Performed At: [facility] [place], [place] [id number] Lindon Romp MD FO:[id number]   POC CBG, ED     Status: Abnormal   Collection Time: 01/24/15 12:46 PM  Result Value Ref Range   Glucose-Capillary 259 (H) 65 - 99 mg/dL  Culture, blood (routine x 2)     Status: None (Preliminary result)   Collection Time: 01/24/15  1:38 PM  Result Value Ref Range   Specimen Description BLOOD LEFT HAND    Special Requests BOTTLES DRAWN AEROBIC AND ANAEROBIC 6CCS    Culture NO GROWTH < 24 HOURS    Report Status PENDING   Wound culture     Status: None (Preliminary result)   Collection Time: 01/24/15  2:05 PM  Result Value Ref Range   Specimen Description WOUND    Special Requests LEFT 5TH TOE    Gram Stain      NO WBC SEEN RARE SQUAMOUS EPITHELIAL CELLS PRESENT FEW GRAM POSITIVE COCCI IN PAIRS RARE GRAM NEGATIVE RODS Performed at [facility]    Culture PENDING    Report Status PENDING   Glucose, capillary     Status: Abnormal   Collection Time: 01/24/15  4:46 PM  Result Value Ref Range   Glucose-Capillary 208 (H) 65 - 99 mg/dL  HIV antibody     Status: None   Collection Time: 01/24/15  5:35 PM  Result Value Ref Range   HIV Screen 4th Generation wRfx Non Reactive Non Reactive    Comment: (NOTE) Performed At: [facility] [place], [place] [id number] Lindon Romp MD JG:[id number]   Glucose, capillary     Status: Abnormal   Collection Time: 01/24/15  9:01 PM  Result Value Ref Range   Glucose-Capillary 202 (H) 65 - 99 mg/dL  Comprehensive metabolic panel     Status: Abnormal   Collection Time: 01/25/15  6:20 AM  Result Value Ref Range   Sodium 133 (L) 135 - 145 mmol/L   Potassium 4.2 3.5 - 5.1 mmol/L    Comment: SLIGHT HEMOLYSIS   Chloride 101 101 - 111 mmol/L   CO2 22 22 - 32 mmol/L   Glucose, Bld 116 (H) 65 - 99 mg/dL   BUN 76 (H) 6 - 20 mg/dL   Creatinine, Ser 4.92 (H) 0.44 -  1.00 mg/dL   Calcium 8.3 (L) 8.9 - 10.3 mg/dL   Total Protein 5.8 (L) 6.5 - 8.1 g/dL   Albumin 2.2 (L) 3.5 - 5.0 g/dL   AST 14 (L) 15 - 41 U/L   ALT 7 (L) 14 - 54 U/L   Alkaline Phosphatase 64 38 - 126 U/L   Total Bilirubin 0.7 0.3 - 1.2 mg/dL   GFR calc non Af Amer 9 (L) >60 mL/min   GFR calc Af Amer 11 (L) >60 mL/min    Comment: (NOTE) The eGFR has been calculated using the CKD EPI equation. This calculation has not been validated in all clinical situations. eGFR's persistently <60 mL/min signify possible Chronic Kidney Disease.    Anion gap 10 5 - 15  CBC     Status: Abnormal   Collection Time: 01/25/15  6:20 AM  Result Value Ref Range   WBC 14.9 (H) 4.0 - 10.5 K/uL   RBC 3.21 (L) 3.87 - 5.11 MIL/uL   Hemoglobin 9.2 (L) 12.0 - 15.0 g/dL    Comment: REPEATED TO VERIFY SPECIMEN CHECKED FOR CLOTS    HCT 29.3 (L)  36.0 - 46.0 %   MCV 91.3 78.0 - 100.0 fL   MCH 28.7 26.0 - 34.0 pg   MCHC 31.4 30.0 - 36.0 g/dL   RDW 13.2 11.5 - 15.5 %   Platelets 183 150 - 400 K/uL  Glucose, capillary     Status: Abnormal   Collection Time: 01/25/15  7:14 AM  Result Value Ref Range   Glucose-Capillary 112 (H) 65 - 99 mg/dL  Glucose, capillary     Status: None   Collection Time: 01/25/15 11:50 AM  Result Value Ref Range   Glucose-Capillary 83 65 - 99 mg/dL    Dg Foot Complete Left  01/24/2015  CLINICAL DATA:  Soft tissue swelling with wounds laterally. EXAM: LEFT FOOT - COMPLETE 3+ VIEW COMPARISON:  December 18, 2014 FINDINGS: Frontal, oblique, and lateral views were obtained. There is extensive soft tissue air laterally in the regions overlying the fifth metatarsal, fifth proximal phalanx, and fifth distal phalanx. There is also soft tissue air in the lateral aspect of the proximal fourth digit. There is diffuse soft tissue swelling, primarily in the midfoot and forefoot regions. Air is seen the volar to the fourth MTP joint as well. There is no overt bony destruction. No erosive change. No  fracture or dislocation. No appreciable joint space narrowing. There is a spur arising from the inferior calcaneus. IMPRESSION: Extensive soft tissue swelling with soft tissue air laterally, primarily involving the soft tissue surrounding the fifth metatarsal and fifth phalanges, but also with soft tissue air volar to the fourth MTP joint and lateral to the fourth proximal and middle phalanges. This air is consistent with infection with possibility of gas gangrene. No overt osteomyelitis is appreciable. No fracture or dislocation. No erosive change. There is a small calcaneal spur inferiorly. Electronically Signed   By: Lowella Grip III M.D.   On: 01/24/2015 12:03    Review of Systems  All other systems reviewed and are negative.  Blood pressure 108/55, pulse 87, temperature 100 F (37.8 C), temperature source Oral, resp. rate 16, weight 80.287 kg (177 lb), SpO2 96 %. Physical Exam On examination patient has a palpable pulse. She has redness and cellulitis of the left foot no redness and swelling cellulitis of the right foot. She is purulent draining ulcer which probes down to the fourth and third metatarsal heads of the left foot. She has necrotic abscess and ulceration of the fifth toe. Radiographs shows air in the soft tissues secondary to the large ulcer however there is no definitive destructive changes of the third and fourth metatarsal heads however these are palpable within the wound. Assessment/Plan: Assessment: Purulent ulcer with osteomyelitis involving the third and fourth metatarsal heads with ulceration abscess and necrosis to the little toe.  Plan: Discussed with the patient that she has been on a strong antibiotic and with the persistent purulent drainage she would require at minimum a transmetatarsal amputation due to involvement of the third and fourth metatarsal heads and involvement of the fifth toe.. Patient states that she does not feel that she has been on IV antibiotics she  states she would like to try antibiotic solution longer. I showed her the bag of Rocephin and the fact that she has been on antibiotics during her hospital stay patient states that she would like to hold off on surgery at this time. I discussed that she is at risk of the infection progressing up her foot and at risk of loss of her foot if the infection progresses more  proximally. Patient states she understands. I will make her nothing by mouth after midnight tonight and discuss this with her again tomorrow morning. Possible transmetatarsal amputation Saturday morning.  Santiago Graf V 01/25/2015, 2:47 PM

## 2015-01-26 NOTE — Anesthesia Postprocedure Evaluation (Signed)
Anesthesia Post Note  Patient: Holly [name]  Procedure(s) Performed: Procedure(s) (LRB): POST TRANS MET  (Left)  Patient location during evaluation: PACU Anesthesia Type: General Level of consciousness: awake and alert Pain management: pain level controlled Vital Signs Assessment: post-procedure vital signs reviewed and stable Respiratory status: spontaneous breathing, nonlabored ventilation and respiratory function stable Cardiovascular status: blood pressure returned to baseline and stable Postop Assessment: no signs of nausea or vomiting Anesthetic complications: no    Last Vitals:  Filed Vitals:   01/26/15 1045 01/26/15 1100  BP:  122/57  Pulse:  77  Temp: 36.7 C   Resp:  13    Last Pain:  Filed Vitals:   01/26/15 1109  PainSc: 0-No pain                 Zenaida Deed

## 2015-01-26 NOTE — Anesthesia Preprocedure Evaluation (Addendum)
Anesthesia Evaluation  Patient identified by MRN, date of birth, ID band Patient awake    Reviewed: Allergy & Precautions, H&P , NPO status , Patient's Chart, lab work & pertinent test results  History of Anesthesia Complications Negative for: history of anesthetic complications  Airway Mallampati: II  TM Distance: >3 FB Neck ROM: full    Dental no notable dental hx. (+) Dental Advisory Given, Poor Dentition   Pulmonary neg pulmonary ROS,    Pulmonary exam normal breath sounds clear to auscultation       Cardiovascular hypertension, Pt. 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)    GI/Hepatic Neg liver ROS, GERD  ,  Endo/Other  diabetes, Poorly Controlled, Type 2, Insulin Dependent  Renal/GU ESRFRenal disease     Musculoskeletal   Abdominal   Peds  Hematology  (+) anemia ,   Anesthesia Other Findings Currently admitted with signs of sepsis and needing source control with foot transmetatarsal amputation  Reproductive/Obstetrics negative OB ROS                          Anesthesia Physical  Anesthesia Plan  ASA: III  Anesthesia Plan: General and Regional   Post-op Pain Management: GA combined w/ Regional for post-op pain   Induction: Intravenous  Airway Management Planned: LMA  Additional Equipment:   Intra-op Plan:   Post-operative Plan:   Informed Consent: I have reviewed the patients History and Physical, chart, labs and discussed the procedure including the risks, benefits and alternatives for the proposed anesthesia with the patient or authorized representative who has indicated his/her understanding and  acceptance.   Dental Advisory Given  Plan Discussed with: Anesthesiologist, CRNA and Surgeon  Anesthesia Plan Comments:         Anesthesia Quick Evaluation

## 2015-01-26 NOTE — Progress Notes (Signed)
Contact isolation continued while in PACU

## 2015-01-27 DIAGNOSIS — N185 Chronic kidney disease, stage 5: Secondary | ICD-10-CM

## 2015-01-27 DIAGNOSIS — IMO0002 Reserved for concepts with insufficient information to code with codable children: Secondary | ICD-10-CM | POA: Insufficient documentation

## 2015-01-27 DIAGNOSIS — L97509 Non-pressure chronic ulcer of other part of unspecified foot with unspecified severity: Secondary | ICD-10-CM

## 2015-01-27 DIAGNOSIS — E1122 Type 2 diabetes mellitus with diabetic chronic kidney disease: Secondary | ICD-10-CM | POA: Insufficient documentation

## 2015-01-27 DIAGNOSIS — D631 Anemia in chronic kidney disease: Secondary | ICD-10-CM

## 2015-01-27 DIAGNOSIS — I5032 Chronic diastolic (congestive) heart failure: Secondary | ICD-10-CM

## 2015-01-27 DIAGNOSIS — E1165 Type 2 diabetes mellitus with hyperglycemia: Secondary | ICD-10-CM

## 2015-01-27 DIAGNOSIS — N189 Chronic kidney disease, unspecified: Secondary | ICD-10-CM

## 2015-01-27 DIAGNOSIS — L02612 Cutaneous abscess of left foot: Secondary | ICD-10-CM

## 2015-01-27 DIAGNOSIS — E11621 Type 2 diabetes mellitus with foot ulcer: Secondary | ICD-10-CM

## 2015-01-27 LAB — CBC
HEMATOCRIT: 26.6 % — AB (ref 36.0–46.0)
HEMOGLOBIN: 8.6 g/dL — AB (ref 12.0–15.0)
MCH: 29.4 pg (ref 26.0–34.0)
MCHC: 32.3 g/dL (ref 30.0–36.0)
MCV: 90.8 fL (ref 78.0–100.0)
PLATELETS: 219 10*3/uL (ref 150–400)
RBC: 2.93 MIL/uL — AB (ref 3.87–5.11)
RDW: 13.3 % (ref 11.5–15.5)
WBC: 15.2 10*3/uL — AB (ref 4.0–10.5)

## 2015-01-27 LAB — BASIC METABOLIC PANEL
ANION GAP: 15 (ref 5–15)
BUN: 85 mg/dL — ABNORMAL HIGH (ref 6–20)
CHLORIDE: 95 mmol/L — AB (ref 101–111)
CO2: 19 mmol/L — AB (ref 22–32)
Calcium: 8.1 mg/dL — ABNORMAL LOW (ref 8.9–10.3)
Creatinine, Ser: 6.15 mg/dL — ABNORMAL HIGH (ref 0.44–1.00)
GFR calc Af Amer: 8 mL/min — ABNORMAL LOW (ref >60)
GFR, EST NON AFRICAN AMERICAN: 7 mL/min — AB (ref >60)
GLUCOSE: 245 mg/dL — AB (ref 65–99)
POTASSIUM: 4.6 mmol/L (ref 3.5–5.1)
Sodium: 129 mmol/L — ABNORMAL LOW (ref 135–145)

## 2015-01-27 LAB — GLUCOSE, CAPILLARY
GLUCOSE-CAPILLARY: 324 mg/dL — AB (ref 65–99)
GLUCOSE-CAPILLARY: 139 mg/dL — AB (ref 65–99)
GLUCOSE-CAPILLARY: 189 mg/dL — AB (ref 65–99)
GLUCOSE-CAPILLARY: 186 mg/dL — AB (ref 65–99)
Glucose-Capillary: 210 mg/dL — ABNORMAL HIGH (ref 65–99)
Glucose-Capillary: 235 mg/dL — ABNORMAL HIGH (ref 65–99)
Glucose-Capillary: 235 mg/dL — ABNORMAL HIGH (ref 65–99)

## 2015-01-27 MED ORDER — SODIUM CHLORIDE 0.9 % IV SOLN
INTRAVENOUS | Status: AC
  Administered 2015-01-27: 19:00:00 via INTRAVENOUS
  Filled 2015-01-27: qty 1000

## 2015-01-27 MED ORDER — OXYCODONE HCL 5 MG PO TABS
5.0000 mg | ORAL_TABLET | ORAL | Status: DC | PRN
  Administered 2015-01-27 – 2015-01-29 (×7): 10 mg via ORAL
  Administered 2015-01-29 – 2015-01-30 (×3): 15 mg via ORAL
  Administered 2015-01-30 – 2015-01-31 (×4): 10 mg via ORAL
  Administered 2015-01-31: 5 mg via ORAL
  Administered 2015-01-31 – 2015-02-02 (×6): 10 mg via ORAL
  Filled 2015-01-27 (×5): qty 2
  Filled 2015-01-27: qty 3
  Filled 2015-01-27 (×3): qty 2
  Filled 2015-01-27: qty 3
  Filled 2015-01-27 (×5): qty 2
  Filled 2015-01-27: qty 3
  Filled 2015-01-27: qty 2
  Filled 2015-01-27: qty 1
  Filled 2015-01-27 (×4): qty 2

## 2015-01-27 MED ORDER — INSULIN DETEMIR 100 UNIT/ML ~~LOC~~ SOLN
5.0000 [IU] | Freq: Two times a day (BID) | SUBCUTANEOUS | Status: DC
  Administered 2015-01-27 – 2015-01-28 (×2): 5 [IU] via SUBCUTANEOUS
  Filled 2015-01-27 (×3): qty 0.05

## 2015-01-27 NOTE — Progress Notes (Signed)
Patient ID: Holly [name], female   DOB: 1965/10/08, 50 y.o.   MRN: [id number] No acute changes.  Left foot dressing is clean and dry.  Has been mobilizing.  Discharge when stable medically.  Ok from ortho standpoint.

## 2015-01-27 NOTE — Evaluation (Signed)
Physical Therapy Evaluation Patient Details Name: Holly [name] MRN: [id number] DOB: 11/01/1965 Today's Date: 01/27/2015   History of Present Illness  50 year old woman with diabetic insensate neuropathy, peripheral vascular disease, CKD and anxiety who presents with ulceration cellulitis of the left little toe and ulceration of third and fourth metatarsal heads. Pt s/p left transmet amputation  Clinical Impression  Pt pleasant but limited by weakness, fatigue, neuropathy and pain. Pt with 4 stairs to enter home and no efficient way to perform other than with WC at this time due to limited activity tolerance and balance. Pt will benefit from acute therapy to maximize mobility, function, strength and balance to decrease burden of care. To maintain NWB status pt will require WC for all distances at this time until activity tolerance and strength improved.     Follow Up Recommendations Home health PT;Supervision for mobility/OOB    Equipment Recommendations  Wheelchair (measurements PT);3in1 (PT);Wheelchair cushion (measurements PT);Other (comment) (knee walker)    Recommendations for Other Services OT consult     Precautions / Restrictions Precautions Precautions: Fall Restrictions Weight Bearing Restrictions: Yes LLE Weight Bearing: Non weight bearing      Mobility  Bed Mobility Overal bed mobility: Modified Independent             General bed mobility comments: increased time with rail and cues to sequence  Transfers Overall transfer level: Needs assistance   Transfers: Sit to/from Stand Sit to Stand: Min assist         General transfer comment: cues for hand placement, assist for balance and rising from surface from bed and recliner  Ambulation/Gait Ambulation/Gait assistance: Min assist Ambulation Distance (Feet): 10 Feet Assistive device: Rolling walker (2 wheeled) (knee walker) Gait Pattern/deviations: Step-to pattern   Gait velocity interpretation: Below  normal speed for age/gender General Gait Details: Pt initiated gait with RW able to hop 3' with cues for posture, position in RW and short steps easily fatigued. After sitting attempted knee walker with pt able to ambulate 10' in straight path, was not able to turn in a circle and again became fatigued and had to have chair pulled to her. Cues with knee walker for midline position, safety and DME use  Stairs            Wheelchair Mobility    Modified Rankin (Stroke Patients Only)       Balance Overall balance assessment: Needs assistance   Sitting balance-Leahy Scale: Good       Standing balance-Leahy Scale: Poor                               Pertinent Vitals/Pain Pain Assessment: 0-10 Pain Score: 7  Pain Location: LLE Pain Descriptors / Indicators: Aching Pain Intervention(s): Limited activity within patient's tolerance;Monitored during session;Repositioned;RN gave pain meds during session    Home Living Family/patient expects to be discharged to:: Private residence Living Arrangements: Spouse/significant other;Children Available Help at Discharge: Family;Available 24 hours/day Type of Home: House Home Access: Stairs to enter Entrance Stairs-Rails: None Entrance Stairs-Number of Steps: 4 Home Layout: One level Home Equipment: None Additional Comments: 1 step between 2 rooms in the house    Prior Function Level of Independence: Independent               Hand Dominance        Extremity/Trunk Assessment   Upper Extremity Assessment: Generalized weakness  Lower Extremity Assessment: Generalized weakness      Cervical / Trunk Assessment: Normal  Communication   Communication: No difficulties  Cognition Arousal/Alertness: Awake/alert Behavior During Therapy: WFL for tasks assessed/performed Overall Cognitive Status: Within Functional Limits for tasks assessed                      General Comments       Exercises        Assessment/Plan    PT Assessment Patient needs continued PT services  PT Diagnosis Difficulty walking;Generalized weakness;Acute pain   PT Problem List Decreased strength;Decreased activity tolerance;Decreased balance;Decreased mobility;Pain;Decreased knowledge of use of DME  PT Treatment Interventions Gait training;Stair training;DME instruction;Functional mobility training;Therapeutic activities;Therapeutic exercise;Patient/family education   PT Goals (Current goals can be found in the Care Plan section) Acute Rehab PT Goals 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 PT Goal Formulation: With patient Time For Goal Achievement: 02/10/15 Potential to Achieve Goals: Fair    Frequency Min 3X/week   Barriers to discharge        Co-evaluation               End of Session Equipment Utilized During Treatment: Gait belt Activity Tolerance: Patient tolerated treatment well Patient left: in chair;with call bell/phone within reach;with nursing/sitter in room Nurse Communication: Mobility status;Precautions;Weight bearing status         Time: 0757-0828 PT Time Calculation (min) (ACUTE ONLY): 31 min   Charges:   PT Evaluation $Initial PT Evaluation Tier I: 1 Procedure moderate eval PT Treatments $Therapeutic Activity: 8-22 mins   PT G CodesMelford Aase 01/27/2015, 8:42 AM Elwyn Reach, [place]

## 2015-01-27 NOTE — Progress Notes (Signed)
PROGRESS NOTE  Holly [name] Jost [id number] DOB: 1965/05/21 DOA: 01/24/2015 PCP: [name], MD HPI on 01/24/2015 by [name], NP 50 year female patient with known diabetes with significant peripheral neuropathy, advanced chronic kidney disease stage IV with recent placement of AV fistula on 12/13, anemia of chronic kidney disease, hypertension, chronic diastolic heart failure and diabetes on insulin. Patient presented to the ED with complaints of foot pain with recent fevers and chills associated with a foul-smell. Patient reports she has recently been utilizing a compressive type dressing to the left foot as prescribed by her podiatrist. About 3 days ago she began noticing pain in the foot and suspected she may have placed the dressing too tightly. When the dressing was removed she noticed swelling and discoloration of the left foot with redness as well as with maceration of the skin around the left pinky toe. Over the past 2 days she has noticed episodic fevers and chills with a MAXIMUM TEMPERATURE of 100.5, poor oral intake with nausea and vomiting. Because of her poor intake she did not utilize her typical insulin therapies. In the past 24 hours she's noticed increasing pain and now a foul odor. She was unable to get an appointment with her podiatrist today so she presented to the ER for treatment. Assessment/Plan: Sepsis secondary to abscess of the left foot abscess -Upon admission, patient had mild tachycardia, leukocytosis -Orthopedics, Dr. Sharol Given, consulted and appreciated, s/p left transmetatarsal amputation -Continue antibiotics-vanc and fortaz, and pain control -PT to be consulted 01/27/2015-->HHPT -Blood cultures negative to date -Wound culture pending -MRI foot -ESR, CRP in am -abscess from the forefoot extended proximal to the ankle as noted in Dr. Jess Barters op note  Diabetes mellitus, type II, stable -Hemoglobin A1c 7.6 on 01/24/2015 -Patient reports being more  compliant with insulin regimen at home or checking her sugars. -Start Levemir 5 units bid with sliding scale  -CBG monitoring  Acute on chronic renal failure, CKD stage 4-5 -AV fistula placed 01/08/2015 -ACE inhibitor held -Patient had mild dehydration upon admission likely secondary to infectious process -Continue to monitor closely -will need nephrology consult if no improvement -worsening due to sepsis  Essential hypertension -BP has been soft -meds held, continue to monitor  Thrombocytopenia -Possibly secondary to infection -Resolved, platelets 115  Chronic diastolic heart failure -ACE inhibitor held -Appears to be currently compensated continue to monitor intake and output, daily weights  Neurogenic bladder/urinary retention -patient states she has not urinated  -foley placed  Normocytic anemia/Anemia of chronic disease -baseline Hb 11, currently 10.4 -Continue to monitor CBC  Code Status: Full  Family Communication: No family at  bedside  Disposition Plan: Admitted  Time Spent in minutes 30 minutes  Procedures  None  Consults  Orthopedic surgery, Dr. Sharol Given  DVT Prophylaxis Heparin       Procedures/Studies: Dg Foot Complete Left  01/24/2015  CLINICAL DATA:  Soft tissue swelling with wounds laterally. EXAM: LEFT FOOT - COMPLETE 3+ VIEW COMPARISON:  December 18, 2014 FINDINGS: Frontal, oblique, and lateral views were obtained. There is extensive soft tissue air laterally in the regions overlying the fifth metatarsal, fifth proximal phalanx, and fifth distal phalanx. There is also soft tissue air in the lateral aspect of the proximal fourth digit. There is diffuse soft tissue swelling, primarily in the midfoot and forefoot regions. Air is seen the volar to the fourth MTP joint as well. There is no overt bony destruction. No erosive change.  No fracture or dislocation. No appreciable joint space narrowing. There is a spur arising from the inferior  calcaneus. IMPRESSION: Extensive soft tissue swelling with soft tissue air laterally, primarily involving the soft tissue surrounding the fifth metatarsal and fifth phalanges, but also with soft tissue air volar to the fourth MTP joint and lateral to the fourth proximal and middle phalanges. This air is consistent with infection with possibility of gas gangrene. No overt osteomyelitis is appreciable. No fracture or dislocation. No erosive change. There is a small calcaneal spur inferiorly. Electronically Signed   By: Bretta Bang III M.D.   On: 01/24/2015 12:03         Subjective: Patient denies fevers, chills, headache, chest pain, dyspnea, nausea, vomiting, diarrhea, abdominal pain, dysuria, hematuria   Objective: Filed Vitals:   01/27/15 0434 01/27/15 0836 01/27/15 1500 01/27/15 1743  BP: 128/57  122/61   Pulse: 91  88   Temp: 99.4 F (37.4 C)  100.2 F (37.9 C) 99.7 F (37.6 C)  TempSrc: Oral  Oral   Resp: 15  18   Weight:      SpO2: 92% 95% 91%     Intake/Output Summary (Last 24 hours) at 01/27/15 1818 Last data filed at 01/27/15 1759  Gross per 24 hour  Intake    730 ml  Output    300 ml  Net    430 ml   Weight change:  Exam:   General:  Pt is alert, follows commands appropriately, not in acute distress  HEENT: No icterus, No thrush, No neck mass, [place]/AT  Cardiovascular: RRR, S1/S2, no rubs, no gallops  Respiratory: Fine bibasilar crackles. No wheezing. Good air movement.  Abdomen: Soft/+BS, non tender, non distended, no guarding; no hepatosplenomegaly  Extremities: trace LE edema, No lymphangitis, No petechiae, No rashes, no synovitis; no clubbing or cyanosis  Data Reviewed: Basic Metabolic Panel:  Recent Labs Lab 01/24/15 1110 01/25/15 0620 01/26/15 0707 01/27/15 0446  NA 132* 133* 130* 129*  K 4.1 4.2 4.9 4.6  CL 95* 101 98* 95*  CO2 22 22 18* 19*  GLUCOSE 287* 116* 96 245*  BUN 71* 76* 79* 85*  CREATININE 4.34* 4.92* 5.19* 6.15*  CALCIUM  9.3 8.3* 8.5* 8.1*   Liver Function Tests:  Recent Labs Lab 01/24/15 1110 01/25/15 0620  AST 12* 14*  ALT 12* 7*  ALKPHOS 78 64  BILITOT 0.9 0.7  PROT 6.8 5.8*  ALBUMIN 2.9* 2.2*   No results for input(s): LIPASE, AMYLASE in the last 168 hours. No results for input(s): AMMONIA in the last 168 hours. CBC:  Recent Labs Lab 01/22/15 1240 01/24/15 1110 01/25/15 0620 01/26/15 0707 01/27/15 0446  WBC  --  10.7* 14.9* 20.7* 15.2*  NEUTROABS  --  9.5*  --   --   --   HGB 11.3* 16.3* 9.2* 10.4* 8.6*  HCT  --  48.9* 29.3* 30.9* 26.6*  MCV  --  90.9 91.3 88.5 90.8  PLT  --  126* 183 217 219   Cardiac Enzymes: No results for input(s): CKTOTAL, CKMB, CKMBINDEX, TROPONINI in the last 168 hours. BNP: Invalid input(s): POCBNP CBG:  Recent Labs Lab 01/27/15 0017 01/27/15 0432 01/27/15 0805 01/27/15 1155 01/27/15 1618  GLUCAP 235* 235* 324* 139* 189*    Recent Results (from the past 240 hour(s))  Culture, blood (routine x 2)     Status: None (Preliminary result)   Collection Time: 01/24/15 11:10 AM  Result Value Ref Range Status   Specimen Description  BLOOD LEFT HAND  Final   Special Requests BOTTLES DRAWN AEROBIC AND ANAEROBIC 5CC  Final   Culture NO GROWTH 3 DAYS  Final   Report Status PENDING  Incomplete  Culture, blood (routine x 2)     Status: None (Preliminary result)   Collection Time: 01/24/15  1:38 PM  Result Value Ref Range Status   Specimen Description BLOOD LEFT HAND  Final   Special Requests BOTTLES DRAWN AEROBIC AND ANAEROBIC 6CCS  Final   Culture NO GROWTH 3 DAYS  Final   Report Status PENDING  Incomplete  Wound culture     Status: None (Preliminary result)   Collection Time: 01/24/15  2:05 PM  Result Value Ref Range Status   Specimen Description WOUND  Final   Special Requests LEFT 5TH TOE  Final   Gram Stain   Final    NO WBC SEEN RARE SQUAMOUS EPITHELIAL CELLS PRESENT FEW GRAM POSITIVE COCCI IN PAIRS RARE GRAM NEGATIVE RODS Performed at  [facility]    Culture   Final    Culture reincubated for better growth Performed at [facility]    Report Status PENDING  Incomplete  Anaerobic culture     Status: None (Preliminary result)   Collection Time: 01/26/15 10:01 AM  Result Value Ref Range Status   Specimen Description WOUND LEFT FOOT  Final   Special Requests POF FORTAZ  Final   Gram Stain   Final    FEW WBC PRESENT,BOTH PMN AND MONONUCLEAR NO SQUAMOUS EPITHELIAL CELLS SEEN ABUNDANT GRAM POSITIVE COCCI IN PAIRS Performed at [facility]    Culture PENDING  Incomplete   Report Status PENDING  Incomplete  Wound culture     Status: None (Preliminary result)   Collection Time: 01/26/15 10:01 AM  Result Value Ref Range Status   Specimen Description WOUND LEFT FOOT  Final   Special Requests POF FORTAZ  Final   Gram Stain   Final    NO WBC SEEN NO SQUAMOUS EPITHELIAL CELLS SEEN RARE GRAM POSITIVE COCCI IN PAIRS Performed at [facility]    Culture PENDING  Incomplete   Report Status PENDING  Incomplete     Scheduled Meds: . calcitRIOL  0.25 mcg Oral QODAY  . calcium acetate  667 mg Oral BID WC  . cefTAZidime (FORTAZ)  IV  2 g Intravenous Q24H  . ferrous sulfate  325 mg Oral TID WC  . FLUoxetine  20 mg Oral q morning - 10a  . heparin  5,000 Units Subcutaneous Q8H  . insulin aspart  0-15 Units Subcutaneous TID WC  . insulin aspart  0-5 Units Subcutaneous QHS  . sodium bicarbonate  1,300 mg Oral BID  . vancomycin  1,000 mg Intravenous Q48H   Continuous Infusions: . sodium chloride 10 mL/hr at 01/26/15 1500  . dextrose 5 % and 0.45% NaCl 75 mL/hr at 01/26/15 0046     Lourdes Manning, DO  [facility] Pager [phone]  If 7PM-7AM, please contact night-coverage [email or web address] Password [email or web address] 01/27/2015, 6:18 PM   LOS: 3 days

## 2015-01-27 NOTE — Progress Notes (Signed)
Pharmacy Antibiotic Follow-up Note  Holly [name] is a 50 y.o. year-old female admitted on 01/24/2015.  The patient is currently on day 4 of vancomycin/ceftazidime for wound infection. infected L foot ulcers, purulent cellulitis  Transmetatarsal amputation left foot on 12/30  Tmax/24hrs 100.6, WBC 14.9>15.2   Assessment/Plan: Pt has received LD and 1 dose vanc (Q48h).  Will get trough before next dose on 1/2 (even though may not be quite at [facility]) since SCr continues to bump  -Cedftazidime 2 g IV q24h -Vancomycin 1g/48h -Monitor renal fx, cultures -Vanc tr/random 01/27/14 1330 (prior to vanc dose)   Temp (24hrs), Avg:99.9 F (37.7 C), Min:99.4 F (37.4 C), Max:100.6 F (38.1 C)   Recent Labs Lab 01/24/15 1110 01/25/15 0620 01/26/15 0707 01/27/15 0446  WBC 10.7* 14.9* 20.7* 15.2*    Recent Labs Lab 01/24/15 1110 01/25/15 0620 01/26/15 0707 01/27/15 0446  CREATININE 4.34* 4.92* 5.19* 6.15*   Estimated Creatinine Clearance: 11.7 mL/min (by C-G formula based on Cr of 6.15).    Allergies  Allergen Reactions  . Bactrim Nausea And Vomiting  . Byetta 10 Mcg Pen [Exenatide] Nausea And Vomiting  . Hctz [Hydrochlorothiazide] Other (See Comments)    dizziness  . Spironolactone Other (See Comments)    Dizziness   . Sulfa Antibiotics Nausea And Vomiting    Antimicrobials this admission: 12/29 vanc > 12/29 ceftaz >  Levels/dose changes this admission: Vanc Trough Pending 01/27/14 1330 (prior to vanc dose)  Microbiology results: 12/29 blood cx: NG2D 12/29 wound cx: GPC + GNR  Thank you for allowing pharmacy to be a part of this patient's care.  [place] PharmD 01/27/2015 1:59 PM

## 2015-01-28 ENCOUNTER — Inpatient Hospital Stay (HOSPITAL_COMMUNITY): Payer: Commercial Managed Care - PPO

## 2015-01-28 DIAGNOSIS — N184 Chronic kidney disease, stage 4 (severe): Secondary | ICD-10-CM

## 2015-01-28 DIAGNOSIS — L03116 Cellulitis of left lower limb: Secondary | ICD-10-CM

## 2015-01-28 DIAGNOSIS — D696 Thrombocytopenia, unspecified: Secondary | ICD-10-CM

## 2015-01-28 DIAGNOSIS — N179 Acute kidney failure, unspecified: Secondary | ICD-10-CM

## 2015-01-28 DIAGNOSIS — Z794 Long term (current) use of insulin: Secondary | ICD-10-CM

## 2015-01-28 LAB — BASIC METABOLIC PANEL
Anion gap: 15 (ref 5–15)
BUN: 88 mg/dL — AB (ref 6–20)
CHLORIDE: 96 mmol/L — AB (ref 101–111)
CO2: 21 mmol/L — AB (ref 22–32)
CREATININE: 6.58 mg/dL — AB (ref 0.44–1.00)
Calcium: 8.6 mg/dL — ABNORMAL LOW (ref 8.9–10.3)
GFR calc Af Amer: 8 mL/min — ABNORMAL LOW (ref >60)
GFR calc non Af Amer: 7 mL/min — ABNORMAL LOW (ref >60)
GLUCOSE: 235 mg/dL — AB (ref 65–99)
POTASSIUM: 5 mmol/L (ref 3.5–5.1)
SODIUM: 132 mmol/L — AB (ref 135–145)

## 2015-01-28 LAB — CBC
HCT: 28.6 % — ABNORMAL LOW (ref 36.0–46.0)
HEMOGLOBIN: 8.9 g/dL — AB (ref 12.0–15.0)
MCH: 28.3 pg (ref 26.0–34.0)
MCHC: 31.1 g/dL (ref 30.0–36.0)
MCV: 91.1 fL (ref 78.0–100.0)
Platelets: 310 10*3/uL (ref 150–400)
RBC: 3.14 MIL/uL — ABNORMAL LOW (ref 3.87–5.11)
RDW: 13.6 % (ref 11.5–15.5)
WBC: 16.7 10*3/uL — ABNORMAL HIGH (ref 4.0–10.5)

## 2015-01-28 LAB — VANCOMYCIN, TROUGH: Vancomycin Tr: 25 ug/mL — ABNORMAL HIGH (ref 10.0–20.0)

## 2015-01-28 LAB — GLUCOSE, CAPILLARY
GLUCOSE-CAPILLARY: 120 mg/dL — AB (ref 65–99)
GLUCOSE-CAPILLARY: 156 mg/dL — AB (ref 65–99)
GLUCOSE-CAPILLARY: 157 mg/dL — AB (ref 65–99)
GLUCOSE-CAPILLARY: 160 mg/dL — AB (ref 65–99)
GLUCOSE-CAPILLARY: 205 mg/dL — AB (ref 65–99)
GLUCOSE-CAPILLARY: 221 mg/dL — AB (ref 65–99)
GLUCOSE-CAPILLARY: 232 mg/dL — AB (ref 65–99)

## 2015-01-28 LAB — SEDIMENTATION RATE: SED RATE: 135 mm/h — AB (ref 0–22)

## 2015-01-28 LAB — C-REACTIVE PROTEIN: CRP: 30.5 mg/dL — ABNORMAL HIGH (ref <1.0)

## 2015-01-28 MED ORDER — NA FERRIC GLUC CPLX IN SUCROSE 12.5 MG/ML IV SOLN
125.0000 mg | Freq: Once | INTRAVENOUS | Status: AC
  Administered 2015-01-28: 125 mg via INTRAVENOUS
  Filled 2015-01-28: qty 10

## 2015-01-28 MED ORDER — INSULIN DETEMIR 100 UNIT/ML ~~LOC~~ SOLN
7.0000 [IU] | Freq: Two times a day (BID) | SUBCUTANEOUS | Status: DC
  Administered 2015-01-28 – 2015-01-31 (×5): 7 [IU] via SUBCUTANEOUS
  Filled 2015-01-28 (×7): qty 0.07

## 2015-01-28 MED ORDER — DARBEPOETIN ALFA 150 MCG/0.3ML IJ SOSY
150.0000 ug | PREFILLED_SYRINGE | INTRAMUSCULAR | Status: DC
  Administered 2015-01-28: 150 ug via SUBCUTANEOUS
  Filled 2015-01-28: qty 0.3

## 2015-01-28 NOTE — Clinical Documentation Improvement (Signed)
Internal Medicine  Please clarify if Sepsis was present or evolving on admission or if Sepsis was acquired after admission. Cellulitis was on admit order; Sepsis not found on Problem List. Please document findings in next progress note; Not in BPA drop down box. Thank you!   Sepsis was present or evolving on admission - Coders/CDI cannot assume anything; must be documented.  Sepsis was acquired after admission  Other  Clinically Undetermined  Supporting Information:  WBC was 14.9 on admission  Has Cellulitis and probably Osteomyelitis of left foot  Please exercise your independent, professional judgment when responding. A specific answer is not anticipated or expected.  Thank You, Zoila Shutter RN, BSN, [place] [phone]; Cell: [phone]

## 2015-01-28 NOTE — Progress Notes (Signed)
Patient ID: Holly [name], female   DOB: 1965-03-26, 50 y.o.   MRN: [id number] Dressing dry. Stable.

## 2015-01-28 NOTE — Progress Notes (Signed)
PROGRESS NOTE  Holly [name] GXQ:[id number] DOB: 01/23/1966 DOA: 01/24/2015 PCP: [name], MD  HPI on 01/24/2015 by [name], NP 50 year female patient with known diabetes with significant peripheral neuropathy, advanced chronic kidney disease stage IV with recent placement of AV fistula on 12/13, anemia of chronic kidney disease, hypertension, chronic diastolic heart failure and diabetes on insulin. Patient presented to the ED with complaints of foot pain with recent fevers and chills associated with a foul-smell. Patient reports she has recently been utilizing a compressive type dressing to the left foot as prescribed by her podiatrist. About 3 days prior to admission she began noticing pain in the foot and suspected she may have placed the dressing too tightly. When the dressing was removed she noticed swelling and discoloration of the left foot with redness as well as with maceration of the skin around the left pinky toe. Over the past 2 days she has noticed episodic fevers and chills with a MAXIMUM TEMPERATURE of 100.5, poor oral intake with nausea and vomiting. Because of her poor intake she did not utilize her typical insulin therapies. After admission, the patient was started on vancomycin and ceftazidime. Orthopedics was consulted. The patient underwent transmetatarsal agitation of her left foot performed by Dr. Sharol Given on 01/26/2015. Unfortunately, the patient's renal function continued to worsen during the hospitalization. Nephrology was consulted. Assessment/Plan: Sepsis secondary to abscess of the left foot abscess -Present at the time of admission patient had tachycardia, leukocytosis, and fever -Orthopedics, Dr. Sharol Given, consulted and appreciated, s/p left transmetatarsal amputation (01/26/15) -01/24/15 wound culture--nonsurgical setting -Continue antibiotics-vanc and fortaz, and pain control -PT to be consulted 01/27/2015-->HHPT -Blood cultures negative to date -MRI  foot--status post TMA--negative for osteomyelitis or abscess -ESR--135, CRP--30.5 -abscess from the forefoot extended proximal to the ankle as noted in Dr. Jess Barters op note  Diabetes mellitus, type II, stable -Hemoglobin A1c 7.6 on 01/24/2015 -Patient reports being more compliant with insulin regimen at home or checking her sugars. -Increase Levemir 7 units bid with sliding scale  -CBG monitoring  Acute on chronic renal failure, CKD stage 4-5 -AV fistula placed 01/08/2015 -ACE inhibitor held -Patient had mild dehydration upon admission likely secondary to infectious process -Continue to monitor closely -will need nephrology consult if no improvement -worsening due to sepsis -01/28/2015 vancomycin trough 25  Essential hypertension -BP has been soft-->improving -meds held, continue to monitor -hold Plendil, benazepril, clonidine  Thrombocytopenia -Possibly secondary to infection -Resolved, platelets 144  Chronic diastolic heart failure -ACE inhibitor held secondary to worsening renal function -Appears to be currently compensated continue to monitor intake and output, daily weights -01/28/2015--chest x-ray negative for pulmonary edema  Neurogenic bladder/urinary retention -patient states she has not urinated  -foley placed -Renal ultrasound negative for hydronephrosis  Normocytic anemia/Anemia of CKD/iron deficiency anemia -baseline Hb 11, currently 10.4 -Continue to monitor CBC -Patient refuses oral iron supplementation -Give IV Nulecit -iron sat - 7%  Code Status: Full  Family Communication: No family at bedside  Disposition Plan: Admitted  Time Spent in minutes 35 minutes  Procedures  None  Consults  Orthopedic surgery, Dr. Sharol Given  DVT Prophylaxis Heparin    Procedures/Studies: Dg Chest 1 View  01/28/2015  CLINICAL DATA:  Hypoxia. EXAM: CHEST 1 VIEW COMPARISON:  11/02/2010 FINDINGS: The heart size and mediastinal contours are within normal limits.  There is no evidence of pulmonary edema, consolidation, pneumothorax, nodule or pleural fluid. The visualized skeletal structures are unremarkable. IMPRESSION:  No active disease. Electronically Signed   By: Aletta Edouard M.D.   On: 01/28/2015 11:30   US Renal  01/28/2015  CLINICAL DATA:  Hypertension and renal stone. EXAM: RENAL / URINARY TRACT ULTRASOUND COMPLETE COMPARISON:  CT scan 03/29/2014 FINDINGS: Right Kidney: Length: 10.2 cm. Echogenicity within normal limits. No mass or hydronephrosis visualized. Left Kidney: Length: 10.9 cm. Echogenicity within normal limits. No mass or hydronephrosis visualized. Bladder: Decompressed by Foley catheter. IMPRESSION: No evidence for hydronephrosis. Electronically Signed   By: Misty Stanley M.D.   On: 01/28/2015 11:15   Mr Foot Left Wo Contrast  01/28/2015  CLINICAL DATA:  Foot pain with fever and chills and cellulitis of the foot. Transmetatarsal amputation of the left foot on 01/26/2015. EXAM: MRI OF THE LEFT FOREFOOT WITHOUT CONTRAST TECHNIQUE: Multiplanar, multisequence MR imaging was performed. No intravenous contrast was administered. COMPARISON:  Radiographs dated 01/24/2015 FINDINGS: The patient has undergone transmetatarsal amputation. The remnants of the bases of the metatarsals and the tarsal bones appear normal. There are no joint effusions. No appreciable soft tissue abscesses. There is subcutaneous edema on the dorsum of the foot. No abnormal fluid collections at the stump. IMPRESSION: Satisfactory appearance of the left foot after a transmetatarsal amputation. No evidence of osteomyelitis or soft tissue abscess. Subcutaneous edema on the dorsum of the foot and around the lateral aspect of the ankle could represent cellulitis. Electronically Signed   By: [name] M.D.   On: 01/28/2015 09:09   Dg Foot Complete Left  01/24/2015  CLINICAL DATA:  Soft tissue swelling with wounds laterally. EXAM: LEFT FOOT - COMPLETE 3+ VIEW COMPARISON:  December 18, 2014 FINDINGS: Frontal, oblique, and lateral views were obtained. There is extensive soft tissue air laterally in the regions overlying the fifth metatarsal, fifth proximal phalanx, and fifth distal phalanx. There is also soft tissue air in the lateral aspect of the proximal fourth digit. There is diffuse soft tissue swelling, primarily in the midfoot and forefoot regions. Air is seen the volar to the fourth MTP joint as well. There is no overt bony destruction. No erosive change. No fracture or dislocation. No appreciable joint space narrowing. There is a spur arising from the inferior calcaneus. IMPRESSION: Extensive soft tissue swelling with soft tissue air laterally, primarily involving the soft tissue surrounding the fifth metatarsal and fifth phalanges, but also with soft tissue air volar to the fourth MTP joint and lateral to the fourth proximal and middle phalanges. This air is consistent with infection with possibility of gas gangrene. No overt osteomyelitis is appreciable. No fracture or dislocation. No erosive change. There is a small calcaneal spur inferiorly. Electronically Signed   By: Lowella Grip III M.D.   On: 01/24/2015 12:03         Subjective: Patient complains of some mild left foot pain. Denies any headaches, chest pain, shortness breath, coughing, hemoptysis, nausea, vomiting, diarrhea, abdominal pain. No dysuria or hematuria. No rashes.  Objective: Filed Vitals:   01/27/15 2008 01/28/15 0239 01/28/15 0428 01/28/15 1653  BP: 115/53  125/61 113/55  Pulse: 84  79 79  Temp: 98.8 F (37.1 C)  98.1 F (36.7 C) 98.5 F (36.9 C)  TempSrc: Oral  Oral Oral  Resp: _0 Weight:  81.194 kg (179 lb)    SpO2: 95%  89% 97%    Intake/Output Summary (Last 24 hours) at 01/28/15 1655 Last data filed at 01/28/15 0600  Gross per 24 hour  Intake   1080  ml  Output    450 ml  Net    630 ml   Weight change:  Exam:   General:  Pt is alert, follows commands  appropriately, not in acute distress  HEENT: No icterus, No thrush, No neck mass, [place]/AT  Cardiovascular: RRR, S1/S2, no rubs, no gallops  Respiratory: CTA bilaterally, no wheezing, no crackles, no rhonchi  Abdomen: Soft/+BS, non tender, non distended, no guarding; no hepatosplenomegaly  Extremities: trace LE edema, No lymphangitis, No petechiae, No rashes, no synovitis; left foot bandaged.  Data Reviewed: Basic Metabolic Panel:  Recent Labs Lab 01/24/15 1110 01/25/15 0620 01/26/15 0707 01/27/15 0446 01/28/15 0342  NA 132* 133* 130* 129* 132*  K 4.1 4.2 4.9 4.6 5.0  CL 95* 101 98* 95* 96*  CO2 22 22 18* 19* 21*  GLUCOSE 287* 116* 96 245* 235*  BUN 71* 76* 79* 85* 88*  CREATININE 4.34* 4.92* 5.19* 6.15* 6.58*  CALCIUM 9.3 8.3* 8.5* 8.1* 8.6*   Liver Function Tests:  Recent Labs Lab 01/24/15 1110 01/25/15 0620  AST 12* 14*  ALT 12* 7*  ALKPHOS 78 64  BILITOT 0.9 0.7  PROT 6.8 5.8*  ALBUMIN 2.9* 2.2*   No results for input(s): LIPASE, AMYLASE in the last 168 hours. No results for input(s): AMMONIA in the last 168 hours. CBC:  Recent Labs Lab 01/24/15 1110 01/25/15 0620 01/26/15 0707 01/27/15 0446 01/28/15 0342  WBC 10.7* 14.9* 20.7* 15.2* 16.7*  NEUTROABS 9.5*  --   --   --   --   HGB 16.3* 9.2* 10.4* 8.6* 8.9*  HCT 48.9* 29.3* 30.9* 26.6* 28.6*  MCV 90.9 91.3 88.5 90.8 91.1  PLT 126* 183 217 219 310   Cardiac Enzymes: No results for input(s): CKTOTAL, CKMB, CKMBINDEX, TROPONINI in the last 168 hours. BNP: Invalid input(s): POCBNP CBG:  Recent Labs Lab 01/27/15 2148 01/28/15 0018 01/28/15 0426 01/28/15 0748 01/28/15 1157  GLUCAP 186* 205* 221* 232* 160*    Recent Results (from the past 240 hour(s))  Culture, blood (routine x 2)     Status: None (Preliminary result)   Collection Time: 01/24/15 11:10 AM  Result Value Ref Range Status   Specimen Description BLOOD LEFT HAND  Final   Special Requests BOTTLES DRAWN AEROBIC AND ANAEROBIC 5CC   Final   Culture NO GROWTH 4 DAYS  Final   Report Status PENDING  Incomplete  Culture, blood (routine x 2)     Status: None (Preliminary result)   Collection Time: 01/24/15  1:38 PM  Result Value Ref Range Status   Specimen Description BLOOD LEFT HAND  Final   Special Requests BOTTLES DRAWN AEROBIC AND ANAEROBIC 6CCS  Final   Culture NO GROWTH 4 DAYS  Final   Report Status PENDING  Incomplete  Wound culture     Status: None (Preliminary result)   Collection Time: 01/24/15  2:05 PM  Result Value Ref Range Status   Specimen Description WOUND  Final   Special Requests LEFT 5TH TOE  Final   Gram Stain   Final    NO WBC SEEN RARE SQUAMOUS EPITHELIAL CELLS PRESENT FEW GRAM POSITIVE COCCI IN PAIRS RARE GRAM NEGATIVE RODS Performed at [facility]    Culture   Final    MODERATE STAPHYLOCOCCUS AUREUS Note: RIFAMPIN AND GENTAMICIN SHOULD NOT BE USED AS SINGLE DRUGS FOR TREATMENT OF STAPH INFECTIONS. Performed at [facility]    Report Status PENDING  Incomplete  Anaerobic culture     Status: None (  Preliminary result)   Collection Time: 01/26/15 10:01 AM  Result Value Ref Range Status   Specimen Description WOUND LEFT FOOT  Final   Special Requests POF FORTAZ  Final   Gram Stain   Final    FEW WBC PRESENT,BOTH PMN AND MONONUCLEAR NO SQUAMOUS EPITHELIAL CELLS SEEN ABUNDANT GRAM POSITIVE COCCI IN PAIRS Performed at [facility]    Culture   Final    NO ANAEROBES ISOLATED; CULTURE IN PROGRESS FOR 5 DAYS Performed at [facility]    Report Status PENDING  Incomplete  Wound culture     Status: None (Preliminary result)   Collection Time: 01/26/15 10:01 AM  Result Value Ref Range Status   Specimen Description WOUND LEFT FOOT  Final   Special Requests POF FORTAZ  Final   Gram Stain   Final    NO WBC SEEN NO SQUAMOUS EPITHELIAL CELLS SEEN RARE GRAM POSITIVE COCCI IN PAIRS Performed at [facility]    Culture   Final    Culture reincubated  for better growth Performed at [facility]    Report Status PENDING  Incomplete     Scheduled Meds: . calcitRIOL  0.25 mcg Oral QODAY  . calcium acetate  667 mg Oral BID WC  . cefTAZidime (FORTAZ)  IV  2 g Intravenous Q24H  . ferric gluconate (FERRLECIT/NULECIT) IV  125 mg Intravenous Once  . ferrous sulfate  325 mg Oral TID WC  . FLUoxetine  20 mg Oral q morning - 10a  . heparin  5,000 Units Subcutaneous Q8H  . insulin aspart  0-15 Units Subcutaneous TID WC  . insulin aspart  0-5 Units Subcutaneous QHS  . insulin detemir  7 Units Subcutaneous BID  . sodium bicarbonate  1,300 mg Oral BID   Continuous Infusions:    Joeanthony Seeling, DO  [facility] Pager [phone]  If 7PM-7AM, please contact night-coverage [email or web address] Password [email or web address] 01/28/2015, 4:55 PM   LOS: 4 days

## 2015-01-28 NOTE — Consult Note (Signed)
[facility] Consultation Note Requesting Physician:  Dr. Carles Collet  Reason for Consult:  AKI on CKD4  Primary Nephrologist: Dr. Jimmy Footman  HPI: The patient is a 50 y.o. year-old WF with DM with triopathy, CKD 4 (followed by Dr. Jimmy Footman, had AVF RUE done 01/08/15 in anticipation of eventual need for dialysis, last seen in the office 11/29 w/creatinine 3.5 at that time), HTN, anemia (on procrit 30K every 3 weeks, dCHF who was admitted with sepsis due to a purulent cellulitis of the left foot, and underwent a left TMA by Dr. Sharol Given on 01/26/15. An abscess was noted in the forefoot that extended proximal to the ankle. She is getting IV fortaz and received vanco on 12/29 and 12/30. Vanco trough was 25 on 1/2   Creatinine on admission was 4.34. ACE (benazepril) was held. Foley was placed d/t inability to void. Has not received NSAIDS. Has had some soft (109/61) BP's (meds held) though no sustained hypotension. Creatinine has unfortunately risen daily, up to 6.5 today and we are asked to see.   She denies SOB, does endorse decreased appetite, swelling has not been big issue.   Creatinine trending for reference is as follows: 01/28/2015 03:42 AM 6.58* 0.44 - 1.00 mg/dL Final  01/27/2015 04:46 AM 6.15* 0.44 - 1.00 mg/dL Final  01/26/2015 07:07 AM 5.19* 0.44 - 1.00 mg/dL Final  01/25/2015 06:20 AM 4.92* 0.44 - 1.00 mg/dL Final  01/24/2015 11:10 AM 4.34* 0.44 - 1.00 mg/dL Final  12/25/14  3.5 (at [facility])    03/31/2014 04:02 AM 3.13* 0.50 - 1.10 mg/dL Final  03/30/2014 05:00 AM 3.39* 0.50 - 1.10 mg/dL Final  03/29/2014 06:30 AM 3.50* 0.50 - 1.10 mg/dL Final    Past Medical History  Diagnosis Date  . Bulimia   . Acute cystitis   . Anxiety state, unspecified   . Essential hypertension, benign   . 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 Dysthymic disorder   . Peripheral autonomic neuropathy in disorders classified elsewhere(337.1)   . Diarrhea     symptomatic  .  Edema     moderate  . Esophageal reflux   . HLD (hyperlipidemia)     HDL goal >50, LDL goal <100  . Unspecified essential hypertension     Renal Dopplers 12/23/09 at [facility] cardiology in [facility]: No significant renal artery stenosis bilaterally  . Disorders of magnesium metabolism   . Irritable bowel syndrome   . Left heart failure ([place])   . Muscle weakness (generalized)   . Dysuria   . Palpitations   . Primary pulmonary hypertension (HCC)     not seen at CATH  (57mm Hg), pt not aware of this  . Neuralgia, neuritis, and radiculitis, unspecified   . Encounter for long-term (current) use of other medications   . Pneumonia 2010  . DM2 (diabetes mellitus, type 2) ([place])   . Type II or unspecified type diabetes mellitus with other specified manifestations, not stated as uncontrolled     Cardiac catheterization 7/10 at [facility] cardiology in [facility]: Normal coronary arteries  . Background diabetic retinopathy(362.01)   . History of blood transfusion X 6-7; 2010 - present (03/29/2014)    "related to OR; kidney issues"  . Iron deficiency anemia     "suppose to get procrit injections q 3 wks; usually don't" do it" (03/29/2014)  . Calculus of kidney   . Chronic kidney disease, stage IV (severe) ([place])   . Complication of anesthesia     slow to  wake up, had lost a lot of blood - 2010  . Heart murmur     been told very slight, never given her any problems  . Cellulitis and abscess of foot 01/24/2015    left foot     Past Surgical History  Procedure Laterality Date  . Tonsillectomy and adenoidectomy    . Cesarean section  2001; 1999; 1994  . Appendectomy    . Dilation and curettage of uterus    . Pars plana vitrectomy Bilateral 2010    "related to DM"  . Eye surgery    . Incision and drainage of wound Right 2010    "serious staph infection"  . Av fistula placement Right 01/08/2015    Procedure: ARTERIOVENOUS (AV) FISTULA CREATION;  Surgeon: Angelia Mould, MD;  Location:  [facility] OR;  Service: Vascular;  Laterality: Right;     Family History  Problem Relation Age of Onset  . Hypertension Mother   . Myelodysplastic syndrome Father   . Pancreatic cancer     Social History:  reports that she has never smoked. She has never used smokeless tobacco. She reports that she drinks alcohol. She reports that she does not use illicit drugs.  Allergies:  Allergies  Allergen Reactions  . Bactrim Nausea And Vomiting  . Byetta 10 Mcg Pen [Exenatide] Nausea And Vomiting  . Hctz [Hydrochlorothiazide] Other (See Comments)    dizziness  . Spironolactone Other (See Comments)    Dizziness   . Sulfa Antibiotics Nausea And Vomiting    Home medications: Prior to Admission medications   Medication Sig Start Date End Date Taking? Authorizing Provider  AURYXIA 1 GM 210 MG(FE) TABS Take 210 mg by mouth 3 (three) times daily with meals. 01/01/15  Yes Historical Provider, MD  benazepril (LOTENSIN) 40 MG tablet Take 1 tablet (40 mg total) by mouth daily. 01/07/12  Yes Josue Hector, MD  calcitRIOL (ROCALTROL) 0.25 MCG capsule Take 0.25 mcg by mouth every other day.   Yes Historical Provider, MD  calcium acetate (PHOSLO) 667 MG capsule Take 667 mg by mouth 2 (two) times daily.   Yes Historical Provider, MD  Epoetin Alfa (PROCRIT IJ) Inject as directed every 21 ( twenty-one) days.   Yes Historical Provider, MD  felodipine (PLENDIL) 5 MG 24 hr tablet Take 5 mg by mouth at bedtime. 12/16/14  Yes Historical Provider, MD  FLUoxetine (PROZAC) 20 MG capsule Take 20 mg by mouth every morning.    Yes Historical Provider, MD  furosemide (LASIX) 40 MG tablet Take 40 mg by mouth daily. 10/14/14  Yes Historical Provider, MD  glucose blood (ONETOUCH VERIO) test strip 1 each by Other route 2 (two) times daily. And lancets 2/day 250/.61 05/25/12  Yes Renato Shin, MD  insulin detemir (LEVEMIR) 100 UNIT/ML injection Inject 13 Units into the skin 2 (two) times daily.    Yes Historical Provider, MD   linagliptin (TRADJENTA) 5 MG TABS tablet Take 5 mg by mouth daily.   Yes Historical Provider, MD  sodium bicarbonate 650 MG tablet Take 1,300 mg by mouth 2 (two) times daily.   Yes Historical Provider, MD  oxyCODONE-acetaminophen (ROXICET) 5-325 MG tablet Take 1 tablet by mouth every 6 (six) hours as needed. 01/08/15   Alvia Grove, PA-C    Inpatient medications: . calcitRIOL  0.25 mcg Oral QODAY  . calcium acetate  667 mg Oral BID WC  . cefTAZidime (FORTAZ)  IV  2 g Intravenous Q24H  . ferric gluconate (FERRLECIT/NULECIT) IV  125 mg Intravenous Once  . ferrous sulfate  325 mg Oral TID WC  . FLUoxetine  20 mg Oral q morning - 10a  . heparin  5,000 Units Subcutaneous Q8H  . insulin aspart  0-15 Units Subcutaneous TID WC  . insulin aspart  0-5 Units Subcutaneous QHS  . insulin detemir  7 Units Subcutaneous BID  . sodium bicarbonate  1,300 mg Oral BID    Review of Systems See HPI    Physical Exam:  BP 113/55 mmHg  Pulse 79  Temp(Src) 98.5 F (36.9 C) (Oral)  Resp 16  Wt 81.194 kg (179 lb)  SpO2 97% Gen: Chronically but not acutely ill appearing WF NAD Poor dentition No JVD Chest: Clear anteriorly without crackles or wheezes Heart: regular, S1S2 No audible S3.  Abdomen: soft, no focal tenderness Ext: Left TMA wrapped. Trace edema LE's.  RUA BC AVF + bruit and thrill Neuro: alert, Ox3, no focal deficit No asterixus   Labs: Basic Metabolic Panel:  Recent Labs Lab 01/24/15 1110 01/25/15 0620 01/26/15 0707 01/27/15 0446 01/28/15 0342  NA 132* 133* 130* 129* 132*  K 4.1 4.2 4.9 4.6 5.0  CL 95* 101 98* 95* 96*  CO2 22 22 18* 19* 21*  GLUCOSE 287* 116* 96 245* 235*  BUN 71* 76* 79* 85* 88*  CREATININE 4.34* 4.92* 5.19* 6.15* 6.58*  CALCIUM 9.3 8.3* 8.5* 8.1* 8.6*     Recent Labs Lab 01/24/15 1110 01/25/15 0620  AST 12* 14*  ALT 12* 7*  ALKPHOS 78 64  BILITOT 0.9 0.7  PROT 6.8 5.8*  ALBUMIN 2.9* 2.2*   CBC:  Recent Labs Lab 01/24/15 1110  01/25/15 0620 01/26/15 0707 01/27/15 0446 01/28/15 0342  WBC 10.7* 14.9* 20.7* 15.2* 16.7*  NEUTROABS 9.5*  --   --   --   --   HGB 16.3* 9.2* 10.4* 8.6* 8.9*  HCT 48.9* 29.3* 30.9* 26.6* 28.6*  MCV 90.9 91.3 88.5 90.8 91.1  PLT 126* 183 217 219 310     Recent Labs Lab 01/27/15 2148 01/28/15 0018 01/28/15 0426 01/28/15 0748 01/28/15 1157  GLUCAP 186* 205* 221* 232* 160*      Recent Labs Lab 01/22/15 1248  IRON 15*  TIBC 206*  FERRITIN 650*    Xrays/Other Studies: Dg Chest 1 View  01/28/2015  CLINICAL DATA:  Hypoxia. EXAM: CHEST 1 VIEW COMPARISON:  11/02/2010 FINDINGS: The heart size and mediastinal contours are within normal limits. There is no evidence of pulmonary edema, consolidation, pneumothorax, nodule or pleural fluid. The visualized skeletal structures are unremarkable. IMPRESSION: No active disease. Electronically Signed   By: Aletta Edouard M.D.   On: 01/28/2015 11:30   US Renal  01/28/2015  CLINICAL DATA:  Hypertension and renal stone. EXAM: RENAL / URINARY TRACT ULTRASOUND COMPLETE COMPARISON:  CT scan 03/29/2014 FINDINGS: Right Kidney: Length: 10.2 cm. Echogenicity within normal limits. No mass or hydronephrosis visualized. Left Kidney: Length: 10.9 cm. Echogenicity within normal limits. No mass or hydronephrosis visualized. Bladder: Decompressed by Foley catheter. IMPRESSION: No evidence for hydronephrosis. Electronically Signed   By: Misty Stanley M.D.   On: 01/28/2015 11:15   Mr Foot Left Wo Contrast  01/28/2015  CLINICAL DATA:  Foot pain with fever and chills and cellulitis of the foot. Transmetatarsal amputation of the left foot on 01/26/2015. EXAM: MRI OF THE LEFT FOREFOOT WITHOUT CONTRAST TECHNIQUE: Multiplanar, multisequence MR imaging was performed. No intravenous contrast was administered. COMPARISON:  Radiographs dated 01/24/2015 FINDINGS: The patient has undergone transmetatarsal amputation.  The remnants of the bases of the metatarsals and the tarsal  bones appear normal. There are no joint effusions. No appreciable soft tissue abscesses. There is subcutaneous edema on the dorsum of the foot. No abnormal fluid collections at the stump. IMPRESSION: Satisfactory appearance of the left foot after a transmetatarsal amputation. No evidence of osteomyelitis or soft tissue abscess. Subcutaneous edema on the dorsum of the foot and around the lateral aspect of the ankle could represent cellulitis. Electronically Signed   By: [name] M.D.   On: 01/28/2015 09:09   Background 50 y.o. year-old WF with DM with triopathy, CKD 4 (followed by Dr. Jimmy Footman, had AVF RUE done 01/08/15 in anticipation of eventual need for dialysis, last seen in the office 11/29 w/creatinine 3.5 at that time), HTN, anemia (on procrit 30K every 3 weeks, dCHF who was admitted with sepsis due to a purulent cellulitis of the left foot, and underwent a left TMA by Dr. Sharol Given on 01/26/15. An abscess was noted in the forefoot that extended proximal to the ankle. She is getting IV fortaz and received vanco on 12/29 and 12/30. Vanco trough was 25 on 1/2 . Creatinine on admission was 4.34. ACE (benazepril) was held. Foley was placed d/t inability to void. UOP only 300-600/day. Has not received NSAIDS. Has had some soft (109/61) BP's (meds held) though no sustained hypotension. Creatinine has unfortunately risen daily, up to 6.5 today and we are asked to see.   Impression/Plan 1. AKI on CKD5 - I suspect a septic hemodynamic etiology, with some volume depletion on admission, soft BP's, ACE PTA, and generous vanco trough despite only 2 doses of vanco. She has no hard indications for dialysis yet but she may well require HD this admission if renal function continues to decline. AVF (12/13) will not be ready for 3 months.  Until WBC down/infection controlled would require temp cath - will follow closely for indications.  (She is currently very resistant to the idea)  She appears euvolemic on exam, don't  think IVF would help here. Acidosis adequately controlled, BP OK now.  Nothing to do differently at this point.  2. Anemia - on procrit 30,000 every 3 weeks as outpt. Last dosed 12/14/14. Has rec'd a dose of Fe for low tsat. Hb down to 8.9. Will give Aranesp 150. 3. Sepsis 2/2 infected left foot - s/p left TMA  - on fortaz. 2 doses vanco. Trough 25. Will be on board for a bit I suspect. 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 On po calcitriol and binders. Recheck. 5. DM - per primary 6. Chronic dCHF - appears compensated at this time. 7. Neurogenic bladder  - foley in place. Not much urine.  Jamal Maes, MD [facility] [phone] Pager 01/28/2015, 5:07 PM

## 2015-01-28 NOTE — Progress Notes (Signed)
Pharmacy Antibiotic Follow-up Note  Holly [name] is a 50 y.o. year-old female admitted on 01/24/2015.  The patient is currently on day 5 of vancomycin/ceftazidime for wound infection. infected L foot ulcers, purulent cellulitis  Transmetatarsal amputation left foot on 12/30  Tmax/24hrs 100.2, WBC up to 16.7  Assessment/Plan: Pt has received LD and 1 dose vanc (Q48h). Vanc random level prior to 2nd maintenance dose today is supratherapeutic today at 25. Have removed vanc from the [facility] pending a repeat vanc random tomorrow to ensure clearance. Calculated Vanc Level will be back in range in ~26 hours, but SCr continues to bump (4.92 on admit, still bumping 6.15>>6.58, CrCl~11, UOP low per documentation 0.2). May need to adjust ceftaz dose soon, also.  -Cedftazidime 2 g IV q24h - may need to adjust tomorrow -Hold vanc for now -Monitor renal fx, cultures -VR to ensure clearance prior to next dose - 1/3 at 1630  Temp (24hrs), Avg:99.2 F (37.3 C), Min:98.1 F (36.7 C), Max:100.2 F (37.9 C)   Recent Labs Lab 01/24/15 1110 01/25/15 0620 01/26/15 0707 01/27/15 0446 01/28/15 0342  WBC 10.7* 14.9* 20.7* 15.2* 16.7*     Recent Labs Lab 01/24/15 1110 01/25/15 0620 01/26/15 0707 01/27/15 0446 01/28/15 0342  CREATININE 4.34* 4.92* 5.19* 6.15* 6.58*   Estimated Creatinine Clearance: 11 mL/min (by C-G formula based on Cr of 6.58).    Allergies  Allergen Reactions  . Bactrim Nausea And Vomiting  . Byetta 10 Mcg Pen [Exenatide] Nausea And Vomiting  . Hctz [Hydrochlorothiazide] Other (See Comments)    dizziness  . Spironolactone Other (See Comments)    Dizziness   . Sulfa Antibiotics Nausea And Vomiting    Antimicrobials this admission: 12/29 vanc > 12/29 ceftaz >  Levels/dose changes this admission: VR 1/2: 25 - s/p LD + 1 vanc dose  Microbiology results: 12/29 blood cx: NG2D 12/29 wound cx: staph 12/30 wound cx:  Elicia Lamp, PharmD, BCPS Clinical Pharmacist Pager  [phone] 01/28/2015 10:42 AM

## 2015-01-28 NOTE — Progress Notes (Signed)
Phlebotomy contacted regarding needed vanc trough.  To be drawn.  Pharmacy aware, vanc remains on hold until further instruction following lab result.

## 2015-01-28 NOTE — Progress Notes (Signed)
PT Cancellation Note  Patient Details Name: Holly [name] MRN: [id number] DOB: 10-22-1965   Cancelled Treatment:    Reason Eval/Treat Not Completed: Patient declined, no reason specified Pt refused participating in PT despite max encouragement. Pt reported that she needs an iron infusion because she is too weak to try sitting on the EOB right now. RN aware and reported pt has been lethargic today. PT will check on pt later as time allows.    [place] April, PTA Pager: [phone]   01/28/2015, 1:32 PM

## 2015-01-28 NOTE — Care Management Note (Signed)
Case Management Note  Patient Details  Name: Holly [name] MRN: [id number] Date of Birth: [birth year]  Subjective/Objective:           Admitted with left foot abscess, s/p left foot transmetatarsal amputation         Action/Plan: Spoke with patient about discharge plan, she selected [facility] for home health. Contacted Edwina at [place] and set up [place] [facility] [place]. Patient states that she will have family available to assist her after discharge. Will continue to follow to determine DME needs.  Expected Discharge Date:                  Expected Discharge Plan:  [place]  In-House Referral:  NA  Discharge planning Services  CM Consult  Post Acute Care Choice:  Durable Medical Equipment, Home Health Choice offered to:  Patient  DME Arranged:    DME Agency:     HH Arranged:  RN, PT [place] Agency:  [place]  Status of Service:  In process, will continue to follow  Medicare Important Message Given:    Date Medicare IM Given:    Medicare IM give by:    Date Additional Medicare IM Given:    Additional Medicare Important Message give by:     If discussed at [place] of Stay Meetings, dates discussed:    Additional Comments:  Nila Nephew, RN 01/28/2015, 3:13 PM

## 2015-01-29 ENCOUNTER — Ambulatory Visit: Payer: Commercial Managed Care - PPO | Admitting: Podiatry

## 2015-01-29 ENCOUNTER — Encounter (HOSPITAL_COMMUNITY): Payer: Self-pay | Admitting: Orthopedic Surgery

## 2015-01-29 LAB — RENAL FUNCTION PANEL
ALBUMIN: 1.5 g/dL — AB (ref 3.5–5.0)
ANION GAP: 13 (ref 5–15)
BUN: 87 mg/dL — AB (ref 6–20)
CHLORIDE: 99 mmol/L — AB (ref 101–111)
CO2: 24 mmol/L (ref 22–32)
Calcium: 8.7 mg/dL — ABNORMAL LOW (ref 8.9–10.3)
Creatinine, Ser: 6.15 mg/dL — ABNORMAL HIGH (ref 0.44–1.00)
GFR, EST AFRICAN AMERICAN: 8 mL/min — AB (ref >60)
GFR, EST NON AFRICAN AMERICAN: 7 mL/min — AB (ref >60)
Glucose, Bld: 122 mg/dL — ABNORMAL HIGH (ref 65–99)
PHOSPHORUS: 5 mg/dL — AB (ref 2.5–4.6)
POTASSIUM: 4.3 mmol/L (ref 3.5–5.1)
Sodium: 136 mmol/L (ref 135–145)

## 2015-01-29 LAB — WOUND CULTURE: Gram Stain: NONE SEEN

## 2015-01-29 LAB — CULTURE, BLOOD (ROUTINE X 2)
Culture: NO GROWTH
Culture: NO GROWTH

## 2015-01-29 LAB — GLUCOSE, CAPILLARY
GLUCOSE-CAPILLARY: 106 mg/dL — AB (ref 65–99)
GLUCOSE-CAPILLARY: 150 mg/dL — AB (ref 65–99)
GLUCOSE-CAPILLARY: 160 mg/dL — AB (ref 65–99)
GLUCOSE-CAPILLARY: 166 mg/dL — AB (ref 65–99)
GLUCOSE-CAPILLARY: 98 mg/dL (ref 65–99)
Glucose-Capillary: 121 mg/dL — ABNORMAL HIGH (ref 65–99)

## 2015-01-29 LAB — HIV ANTIBODY (ROUTINE TESTING W REFLEX): HIV Screen 4th Generation wRfx: NONREACTIVE

## 2015-01-29 LAB — URINALYSIS, ROUTINE W REFLEX MICROSCOPIC
BILIRUBIN URINE: NEGATIVE
Glucose, UA: NEGATIVE mg/dL
Ketones, ur: 15 mg/dL — AB
NITRITE: NEGATIVE
PH: 5 (ref 5.0–8.0)
Protein, ur: 100 mg/dL — AB
Specific Gravity, Urine: 1.016 (ref 1.005–1.030)

## 2015-01-29 LAB — URINE MICROSCOPIC-ADD ON

## 2015-01-29 LAB — CBC
HEMATOCRIT: 28 % — AB (ref 36.0–46.0)
HEMOGLOBIN: 8.6 g/dL — AB (ref 12.0–15.0)
MCH: 28.5 pg (ref 26.0–34.0)
MCHC: 30.7 g/dL (ref 30.0–36.0)
MCV: 92.7 fL (ref 78.0–100.0)
Platelets: 315 10*3/uL (ref 150–400)
RBC: 3.02 MIL/uL — ABNORMAL LOW (ref 3.87–5.11)
RDW: 14 % (ref 11.5–15.5)
WBC: 15.1 10*3/uL — ABNORMAL HIGH (ref 4.0–10.5)

## 2015-01-29 LAB — VANCOMYCIN, RANDOM: Vancomycin Rm: 22 ug/mL

## 2015-01-29 MED ORDER — HYDROXYZINE HCL 10 MG PO TABS
10.0000 mg | ORAL_TABLET | Freq: Four times a day (QID) | ORAL | Status: DC | PRN
  Administered 2015-01-30 – 2015-01-31 (×4): 10 mg via ORAL
  Filled 2015-01-29 (×6): qty 1

## 2015-01-29 MED ORDER — CAMPHOR-MENTHOL 0.5-0.5 % EX LOTN
TOPICAL_LOTION | CUTANEOUS | Status: DC | PRN
  Filled 2015-01-29: qty 222

## 2015-01-29 NOTE — Progress Notes (Addendum)
Pharmacy Antibiotic Follow-up Note  Holly [name] is a 50 y.o. female admitted on 01/24/2015.  The patient is currently on day 6 of vancomycin for infected left foot ulcers/cellulitis.  She is s/p transmetatarsal amputation on 01/25/15.  Patient has CKD and has a fistula but is not currently on dialysis, so pharmacy is dosing vancomycin per level.  Vancomycin random level 26 hours post last level remains elevated at 22 mcg/mL (Ke = 0.004917, t1/2 = 141 hrs).  Goal of Therapy: VT = 10-15 mcg/mL   Plan: - Continue to hold vanc.  Repeat vanc random level in 5-7 days pending no improvement in renal function or initiation of dialysis - Continue to monitor renal fxn and dose per level    Temp (24hrs), Avg:98.3 F (36.8 C), Min:97.9 F (36.6 C), Max:98.6 F (37 C)   Recent Labs Lab 01/25/15 0620 01/26/15 0707 01/27/15 0446 01/28/15 0342 01/29/15 0432  WBC 14.9* 20.7* 15.2* 16.7* 15.1*    Recent Labs Lab 01/25/15 0620 01/26/15 0707 01/27/15 0446 01/28/15 0342 01/29/15 0742  CREATININE 4.92* 5.19* 6.15* 6.58* 6.15*   Estimated Creatinine Clearance: 11.8 mL/min (by C-G formula based on Cr of 6.15).    Allergies  Allergen Reactions  . Bactrim Nausea And Vomiting  . Byetta 10 Mcg Pen [Exenatide] Nausea And Vomiting  . Hctz [Hydrochlorothiazide] Other (See Comments)    dizziness  . Spironolactone Other (See Comments)    Dizziness   . Sulfa Antibiotics Nausea And Vomiting    Antimicrobials this admission: 12/29 vanc > 12/29 ceftaz > 1/3  Levels/dose changes this admission: 1/2 VR at 1430: 25 - s/p LD + 1 dose of vanc (hold doses pending repeat VR) 1/3 VR at 1630: 22 mcg/mL (Ke = [id number], t1/2 = 141 hrs)  Microbiology results: 12/29 blood cx: ntd 12/29 wound cx: staph aureus (S-pending) 12/31 wound cx:     Shakema Surita D. Mina Marble, PharmD, BCPS Pager:  [phone] 01/29/2015, 8:13 PM

## 2015-01-29 NOTE — Progress Notes (Signed)
[facility] Rounding Note  Subjective:  Episode of vomiting earlier today Not nauseous now No SOB Mother in with patient - questions answered  Objective Vital signs in last 24 hours: Filed Vitals:   01/29/15 0458 01/29/15 0500 01/29/15 0949 01/29/15 1202  BP: 134/66   141/65  Pulse: 79   78  Temp: 97.9 F (36.6 C)   98.6 F (37 C)  TempSrc: Oral     Resp: 18   18  Weight:  82.555 kg (182 lb)    SpO2: 97%  94% 98%   Weight change: 1.361 kg (3 lb)  Intake/Output Summary (Last 24 hours) at 01/29/15 1412 Last data filed at 01/29/15 0600  Gross per 24 hour  Intake    480 ml  Output    450 ml  Net     30 ml    Physical Exam:  Blood pressure 141/65, pulse 78, temperature 98.6 F (37 C), temperature source Oral, resp. rate 18, weight 82.555 kg (182 lb), SpO2 98 %. Gen: Chronically but not acutely ill appearing WF - pleasant - "I do not want you to tell me I need dialysis" NAD Poor dentition No JVD Chest: Clear anteriorly without crackles or wheezes Heart: regular, S1S2 No audible S3.  Abdomen: soft, no focal tenderness Ext: Left TMA wrapped. Trace edema LE's.  RUA BC AVF + bruit and thrill Neuro: alert, Ox3, no focal deficit No asterixus   Labs: Basic Metabolic Panel:  Recent Labs Lab 01/24/15 1110 01/25/15 0620 01/26/15 0707 01/27/15 0446 01/28/15 0342 01/29/15 0742  NA 132* 133* 130* 129* 132* 136  K 4.1 4.2 4.9 4.6 5.0 4.3  CL 95* 101 98* 95* 96* 99*  CO2 22 22 18* 19* 21* 24  GLUCOSE 287* 116* 96 245* 235* 122*  BUN 71* 76* 79* 85* 88* 87*  CREATININE 4.34* 4.92* 5.19* 6.15* 6.58* 6.15*  CALCIUM 9.3 8.3* 8.5* 8.1* 8.6* 8.7*  PHOS  --   --   --   --   --  5.0*   Liver Function Tests:  Recent Labs Lab 01/24/15 1110 01/25/15 0620 01/29/15 0742  AST 12* 14*  --   ALT 12* 7*  --   ALKPHOS 78 64  --   BILITOT 0.9 0.7  --   PROT 6.8 5.8*  --   ALBUMIN 2.9* 2.2* 1.5*   CBC:  Recent Labs Lab 01/24/15 1110  01/26/15 0707  01/27/15 0446 01/28/15 0342 01/29/15 0432  WBC 10.7*  < > 20.7* 15.2* 16.7* 15.1*  NEUTROABS 9.5*  --   --   --   --   --   HGB 16.3*  < > 10.4* 8.6* 8.9* 8.6*  HCT 48.9*  < > 30.9* 26.6* 28.6* 28.0*  MCV 90.9  < > 88.5 90.8 91.1 92.7  PLT 126*  < > 217 219 310 315  < > = values in this interval not displayed.    Recent Labs Lab 01/28/15 2156 01/29/15 0008 01/29/15 0454 01/29/15 1121 01/29/15 1205  GLUCAP 157* 166* 121* 98 106*     Studies/Results: Dg Chest 1 View  01/28/2015  CLINICAL DATA:  Hypoxia. EXAM: CHEST 1 VIEW COMPARISON:  11/02/2010 FINDINGS: The heart size and mediastinal contours are within normal limits. There is no evidence of pulmonary edema, consolidation, pneumothorax, nodule or pleural fluid. The visualized skeletal structures are unremarkable. IMPRESSION: No active disease. Electronically Signed   By: Aletta Edouard M.D.   On: 01/28/2015 11:30   US Renal  01/28/2015  CLINICAL DATA:  Hypertension and renal stone. EXAM: RENAL / URINARY TRACT ULTRASOUND COMPLETE COMPARISON:  CT scan 03/29/2014 FINDINGS: Right Kidney: Length: 10.2 cm. Echogenicity within normal limits. No mass or hydronephrosis visualized. Left Kidney: Length: 10.9 cm. Echogenicity within normal limits. No mass or hydronephrosis visualized. Bladder: Decompressed by Foley catheter. IMPRESSION: No evidence for hydronephrosis. Electronically Signed   By: Misty Stanley M.D.   On: 01/28/2015 11:15   Mr Foot Left Wo Contrast  01/28/2015  CLINICAL DATA:  Foot pain with fever and chills and cellulitis of the foot. Transmetatarsal amputation of the left foot on 01/26/2015. EXAM: MRI OF THE LEFT FOREFOOT WITHOUT CONTRAST TECHNIQUE: Multiplanar, multisequence MR imaging was performed. No intravenous contrast was administered. COMPARISON:  Radiographs dated 01/24/2015 FINDINGS: The patient has undergone transmetatarsal amputation. The remnants of the bases of the metatarsals and the tarsal bones appear normal.  There are no joint effusions. No appreciable soft tissue abscesses. There is subcutaneous edema on the dorsum of the foot. No abnormal fluid collections at the stump. IMPRESSION: Satisfactory appearance of the left foot after a transmetatarsal amputation. No evidence of osteomyelitis or soft tissue abscess. Subcutaneous edema on the dorsum of the foot and around the lateral aspect of the ankle could represent cellulitis. Electronically Signed   By: [name] M.D.   On: 01/28/2015 09:09   Medications:   . calcitRIOL  0.25 mcg Oral QODAY  . calcium acetate  667 mg Oral BID WC  . cefTAZidime (FORTAZ)  IV  2 g Intravenous Q24H  . darbepoetin (ARANESP) injection - NON-DIALYSIS  150 mcg Subcutaneous Q Mon-1800  . ferrous sulfate  325 mg Oral TID WC  . FLUoxetine  20 mg Oral q morning - 10a  . heparin  5,000 Units Subcutaneous Q8H  . insulin aspart  0-15 Units Subcutaneous TID WC  . insulin aspart  0-5 Units Subcutaneous QHS  . insulin detemir  7 Units Subcutaneous BID  . sodium bicarbonate  1,300 mg Oral BID   Background 50 y.o. year-old WF with DM with triopathy, CKD 4 (followed by Dr. Jimmy Footman, had AVF RUE done 01/08/15 in anticipation of eventual need for dialysis, last seen in the office 11/29 w/creatinine 3.5 at that time), HTN, anemia (on procrit 30K every 3 weeks, dCHF who was admitted with sepsis due to a purulent cellulitis of the left foot, and underwent a left TMA by Dr. Sharol Given on 01/26/15. An abscess was noted in the forefoot that extended proximal to the ankle. She is getting IV fortaz and received vanco on 12/29 and 12/30. Vanco trough was 25 on 1/2 . Creatinine on admission was 4.34. ACE (benazepril) was held. Foley was placed d/t inability to void. UOP only 300-600/day. Has not received NSAIDS. Had some soft (109/61) BP's (meds held) though no sustained hypotension. Creatinine rose to a max of 6.5 on 1/2 and we were asked to see.   Impression/Plan 1. AKI on CKD5 - I suspect a septic  hemodynamic etiology, with some volume depletion on admission, soft BP's, ACE PTA, and generous vanco trough despite only 2 doses of vanco. She has had no hard indications for dialysis yet and fortunately it appears that creatinine may be starting to trend back down. Will follow closely for dialysis indications. (She is currently very resistant to the idea of dialysis) Appears euvolemic on exam, don't think IVF's needed. Acidosis adequately controlled, BP OK now. Continue to hold ACE. Nothing to do differently at this point.  2. Anemia -  on procrit 30,000 every 3 weeks as outpt. Last dosed 12/14/14. Has rec'd a dose of Fe for low tsat. Hb down to 8.9. Dosed Aranesp 150 on 01/28/15. 3. Sepsis 2/2 infected left foot - s/p left TMA - on fortaz. 2 doses vanco. Trough 25. Will be on board for a bit I suspect. 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 On po calcitriol and binders. Recheck pending. 5. DM - per primary 6. Chronic dCHF - appears compensated at this time. 7. Neurogenic bladder   Jamal Maes, MD [facility] [phone] pager 01/29/2015, 2:12 PM

## 2015-01-29 NOTE — Progress Notes (Signed)
Patient ID: Holly [name], female   DOB: 03-23-1965, 50 y.o.   MRN: [id number] Patient is status post left transmetatarsal amputation. Patient still has an elevated white blood cell count of 15,000 her calf is soft nontender to palpation there is no redness no cellulitis no signs of infection in her calf. While I am concerned patient still has an elevated white blood cell count there is no clinical signs of infection in the ankle or calf. Would recommend continue IV antibiotics and I will continue to follow her.

## 2015-01-29 NOTE — Progress Notes (Signed)
Physical Therapy Treatment Patient Details Name: Holly [name] MRN: [id number] DOB: 12/01/1965 Today's Date: 01/29/2015    History of Present Illness 50 year old woman with diabetic insensate neuropathy, peripheral vascular disease, CKD and anxiety who presents with ulceration cellulitis of the left little toe and ulceration of third and fourth metatarsal heads. Pt s/p left transmet amputation    PT Comments    Pt with flat affect and required encouragement to mobilize and be OOB. Pt states she just wants to lie in bed and educated for importance of mobility with pt agreeable to limited mobility. Pt with sats 90-96% on RA throughout activity with dropping to 88% on RA end of session and 1.5L O2 reapplied. Encouraged HEP throughout the day. Will continue to follow.   Follow Up Recommendations  Home health PT;Supervision for mobility/OOB     Equipment Recommendations  Wheelchair (measurements PT);3in1 (PT);Wheelchair cushion (measurements PT);Other (comment)    Recommendations for Other Services       Precautions / Restrictions Precautions Precautions: Fall Restrictions LLE Weight Bearing: Non weight bearing    Mobility  Bed Mobility Overal bed mobility: Modified Independent                Transfers Overall transfer level: Needs assistance   Transfers: Sit to/from Stand Sit to Stand: Min assist         General transfer comment: cues for hand placement, assist for balance and rising from surface from bed with LLE blocked to prevent weight bearing  Ambulation/Gait Ambulation/Gait assistance: Min assist Ambulation Distance (Feet): 5 Feet Assistive device: Rolling walker (2 wheeled) Gait Pattern/deviations: Step-to pattern   Gait velocity interpretation: Below normal speed for age/gender General Gait Details: pt with cues for Rw use, sequence and hopping. pt able to ambulate 5' then had to sit due to fatigue and dizziness with pt denying attempting  again   Stairs            Wheelchair Mobility    Modified Rankin (Stroke Patients Only)       Balance                                    Cognition Arousal/Alertness: Awake/alert Behavior During Therapy: Flat affect Overall Cognitive Status: Within Functional Limits for tasks assessed                      Exercises General Exercises - Lower Extremity Long Arc Quad: AROM;Seated;Both;15 reps Hip Flexion/Marching: AROM;Seated;Both;15 reps    General Comments        Pertinent Vitals/Pain Pain Score: 7  Pain Location: LLE Pain Descriptors / Indicators: Aching;Sore Pain Intervention(s): Repositioned;Premedicated before session;Limited activity within patient's tolerance;Monitored during session    Home Living                      Prior Function            PT Goals (current goals can now be found in the care plan section) Progress towards PT goals: Progressing toward goals (slowly)    Frequency       PT Plan Current plan remains appropriate    Co-evaluation             End of Session Equipment Utilized During Treatment: Gait belt Activity Tolerance: Patient tolerated treatment well Patient left: in chair;with call bell/phone within reach;with chair alarm set     Time: [phone] PT  Time Calculation (min) (ACUTE ONLY): 19 min  Charges:  $Gait Training: 8-22 mins                    G Codes:      Melford Aase 02-23-2015, 9:51 AM Elwyn Reach, [place]

## 2015-01-29 NOTE — Progress Notes (Signed)
PROGRESS NOTE  Holly [name] OAD:[id number] DOB: 1965/05/18 DOA: 01/24/2015 PCP: [name], MD  HPI on 01/24/2015 by [name], NP 49 year female patient with known diabetes with significant peripheral neuropathy, advanced chronic kidney disease stage IV with recent placement of AV fistula on 12/13, anemia of chronic kidney disease, hypertension, chronic diastolic heart failure and diabetes on insulin. Patient presented to the ED with complaints of foot pain with recent fevers and chills associated with a foul-smell. Patient reports she has recently been utilizing a compressive type dressing to the left foot as prescribed by her podiatrist. About 3 days prior to admission she began noticing pain in the foot and suspected she may have placed the dressing too tightly. When the dressing was removed she noticed swelling and discoloration of the left foot with redness as well as with maceration of the skin around the left pinky toe. Over the past 2 days she has noticed episodic fevers and chills with a MAXIMUM TEMPERATURE of 100.5, poor oral intake with nausea and vomiting. Because of her poor intake she did not utilize her typical insulin therapies. After admission, the patient was started on vancomycin and ceftazidime. Orthopedics was consulted. The patient underwent transmetatarsal agitation of her left foot performed by Dr. Sharol Given on 01/26/2015. Unfortunately, the patient's renal function continued to worsen during the hospitalization. Nephrology was consulted. Assessment/Plan: Sepsis secondary to abscess of the left foot abscess -Present at the time of admission patient had tachycardia, leukocytosis, and fever -Orthopedics, Dr. Sharol Given, consulted and appreciated, s/p left transmetatarsal amputation (01/26/15) -01/24/15 wound culture--nonsurgical setting -Continue antibiotics-vanc and fortaz, and pain control -PT to be consulted 01/27/2015-->HHPT -Blood cultures negative to date -MRI  foot--status post TMA--negative for osteomyelitis or abscess -ESR--135, CRP--30.5 -abscess from the forefoot extended proximal to the ankle as noted in Dr. Jess Barters op note -WBC still elevated but appears to have started trending down -check UA/culture  Diabetes mellitus, type II, stable -Hemoglobin A1c 7.6 on 01/24/2015 -Patient reports being more compliant with insulin regimen at home or checking her sugars. -Increase Levemir 7 units bid with sliding scale  -CBG monitoring  Acute on chronic renal failure, CKD stage 4-5 -AV fistula placed 01/08/2015 -ACE inhibitor held -Patient had mild dehydration upon admission, but likely secondary to infectious process -Continue to monitor closely-->hopefully starting to trend down -appreciate nephrology followup -worsening due to sepsis -01/28/2015 vancomycin trough 25  Pruritis -secondary to IV opioid -sarna -prn atarax  Essential hypertension -BP has been soft-->improving -meds held, continue to monitor -hold Plendil, benazepril, clonidine  Thrombocytopenia -Possibly secondary to infection -Resolved, platelets 758  Chronic diastolic heart failure -ACE inhibitor held secondary to worsening renal function -Appears to be currently compensated continue to monitor intake and output, daily weights -01/28/2015--chest x-ray negative for pulmonary edema  Neurogenic bladder/urinary retention -patient states she has not urinated  -foley placed -Renal ultrasound negative for hydronephrosis  Normocytic anemia/Anemia of CKD/iron deficiency anemia -baseline Hb 11, currently 10.4 -Continue to monitor CBC -Patient refuses oral iron supplementation -Given IV Nulecit x 1 01/28/15 -iron sat - 7%  Code Status: Full  Family Communication: No family at bedside  Disposition Plan: Admitted  Time Spent in minutes 35 minutes  Procedures  None  Consults  Orthopedic surgery, Dr. Sharol Given  DVT Prophylaxis  Heparin    Procedures/Studies: Dg Chest 1 View  01/28/2015  CLINICAL DATA:  Hypoxia. EXAM: CHEST 1 VIEW COMPARISON:  11/02/2010 FINDINGS: The heart size and mediastinal  contours are within normal limits. There is no evidence of pulmonary edema, consolidation, pneumothorax, nodule or pleural fluid. The visualized skeletal structures are unremarkable. IMPRESSION: No active disease. Electronically Signed   By: Aletta Edouard M.D.   On: 01/28/2015 11:30   US Renal  01/28/2015  CLINICAL DATA:  Hypertension and renal stone. EXAM: RENAL / URINARY TRACT ULTRASOUND COMPLETE COMPARISON:  CT scan 03/29/2014 FINDINGS: Right Kidney: Length: 10.2 cm. Echogenicity within normal limits. No mass or hydronephrosis visualized. Left Kidney: Length: 10.9 cm. Echogenicity within normal limits. No mass or hydronephrosis visualized. Bladder: Decompressed by Foley catheter. IMPRESSION: No evidence for hydronephrosis. Electronically Signed   By: Misty Stanley M.D.   On: 01/28/2015 11:15   Mr Foot Left Wo Contrast  01/28/2015  CLINICAL DATA:  Foot pain with fever and chills and cellulitis of the foot. Transmetatarsal amputation of the left foot on 01/26/2015. EXAM: MRI OF THE LEFT FOREFOOT WITHOUT CONTRAST TECHNIQUE: Multiplanar, multisequence MR imaging was performed. No intravenous contrast was administered. COMPARISON:  Radiographs dated 01/24/2015 FINDINGS: The patient has undergone transmetatarsal amputation. The remnants of the bases of the metatarsals and the tarsal bones appear normal. There are no joint effusions. No appreciable soft tissue abscesses. There is subcutaneous edema on the dorsum of the foot. No abnormal fluid collections at the stump. IMPRESSION: Satisfactory appearance of the left foot after a transmetatarsal amputation. No evidence of osteomyelitis or soft tissue abscess. Subcutaneous edema on the dorsum of the foot and around the lateral aspect of the ankle could represent cellulitis. Electronically Signed    By: [name] M.D.   On: 01/28/2015 09:09   Dg Foot Complete Left  01/24/2015  CLINICAL DATA:  Soft tissue swelling with wounds laterally. EXAM: LEFT FOOT - COMPLETE 3+ VIEW COMPARISON:  December 18, 2014 FINDINGS: Frontal, oblique, and lateral views were obtained. There is extensive soft tissue air laterally in the regions overlying the fifth metatarsal, fifth proximal phalanx, and fifth distal phalanx. There is also soft tissue air in the lateral aspect of the proximal fourth digit. There is diffuse soft tissue swelling, primarily in the midfoot and forefoot regions. Air is seen the volar to the fourth MTP joint as well. There is no overt bony destruction. No erosive change. No fracture or dislocation. No appreciable joint space narrowing. There is a spur arising from the inferior calcaneus. IMPRESSION: Extensive soft tissue swelling with soft tissue air laterally, primarily involving the soft tissue surrounding the fifth metatarsal and fifth phalanges, but also with soft tissue air volar to the fourth MTP joint and lateral to the fourth proximal and middle phalanges. This air is consistent with infection with possibility of gas gangrene. No overt osteomyelitis is appreciable. No fracture or dislocation. No erosive change. There is a small calcaneal spur inferiorly. Electronically Signed   By: Lowella Grip III M.D.   On: 01/24/2015 12:03         Subjective: Patient complains of itching. Denies any fevers, chills, chest pain, shortness breath, diarrhea, abdominal pain, dysuria, hematuria. She had an episode of vomiting. Denies any headache or neck pain.  Objective: Filed Vitals:   01/29/15 0458 01/29/15 0500 01/29/15 0949 01/29/15 1202  BP: 134/66   141/65  Pulse: 79   78  Temp: 97.9 F (36.6 C)   98.6 F (37 C)  TempSrc: Oral     Resp: 18   18  Weight:  82.555 kg (182 lb)    SpO2: 97%  94% 98%    Intake/Output  Summary (Last 24 hours) at 01/29/15 1749 Last data filed at  01/29/15 0600  Gross per 24 hour  Intake    480 ml  Output    450 ml  Net     30 ml   Weight change: 1.361 kg (3 lb) Exam:   General:  Pt is alert, follows commands appropriately, not in acute distress  HEENT: No icterus, No thrush, No neck mass, Universal/AT  Cardiovascular: RRR, S1/S2, no rubs, no gallops  Respiratory: CTA bilaterally, no wheezing, no crackles, no rhonchi  Abdomen: Soft/+BS, non tender, non distended, no guarding  Extremities: No edema, No lymphangitis, No petechiae, No rashes, no synovitis  Data Reviewed: Basic Metabolic Panel:  Recent Labs Lab 01/25/15 0620 01/26/15 0707 01/27/15 0446 01/28/15 0342 01/29/15 0742  NA 133* 130* 129* 132* 136  K 4.2 4.9 4.6 5.0 4.3  CL 101 98* 95* 96* 99*  CO2 22 18* 19* 21* 24  GLUCOSE 116* 96 245* 235* 122*  BUN 76* 79* 85* 88* 87*  CREATININE 4.92* 5.19* 6.15* 6.58* 6.15*  CALCIUM 8.3* 8.5* 8.1* 8.6* 8.7*  PHOS  --   --   --   --  5.0*   Liver Function Tests:  Recent Labs Lab 01/24/15 1110 01/25/15 0620 01/29/15 0742  AST 12* 14*  --   ALT 12* 7*  --   ALKPHOS 78 64  --   BILITOT 0.9 0.7  --   PROT 6.8 5.8*  --   ALBUMIN 2.9* 2.2* 1.5*   No results for input(s): LIPASE, AMYLASE in the last 168 hours. No results for input(s): AMMONIA in the last 168 hours. CBC:  Recent Labs Lab 01/24/15 1110 01/25/15 0620 01/26/15 0707 01/27/15 0446 01/28/15 0342 01/29/15 0432  WBC 10.7* 14.9* 20.7* 15.2* 16.7* 15.1*  NEUTROABS 9.5*  --   --   --   --   --   HGB 16.3* 9.2* 10.4* 8.6* 8.9* 8.6*  HCT 48.9* 29.3* 30.9* 26.6* 28.6* 28.0*  MCV 90.9 91.3 88.5 90.8 91.1 92.7  PLT 126* 183 217 219 310 315   Cardiac Enzymes: No results for input(s): CKTOTAL, CKMB, CKMBINDEX, TROPONINI in the last 168 hours. BNP: Invalid input(s): POCBNP CBG:  Recent Labs Lab 01/29/15 0008 01/29/15 0454 01/29/15 1121 01/29/15 1205 01/29/15 1651  GLUCAP 166* 121* 98 106* 160*    Recent Results (from the past 240 hour(s))   Culture, blood (routine x 2)     Status: None   Collection Time: 01/24/15 11:10 AM  Result Value Ref Range Status   Specimen Description BLOOD LEFT HAND  Final   Special Requests BOTTLES DRAWN AEROBIC AND ANAEROBIC 5CC  Final   Culture NO GROWTH 5 DAYS  Final   Report Status 01/29/2015 FINAL  Final  Culture, blood (routine x 2)     Status: None   Collection Time: 01/24/15  1:38 PM  Result Value Ref Range Status   Specimen Description BLOOD LEFT HAND  Final   Special Requests BOTTLES DRAWN AEROBIC AND ANAEROBIC 6CCS  Final   Culture NO GROWTH 5 DAYS  Final   Report Status 01/29/2015 FINAL  Final  Wound culture     Status: None   Collection Time: 01/24/15  2:05 PM  Result Value Ref Range Status   Specimen Description WOUND  Final   Special Requests LEFT 5TH TOE  Final   Gram Stain   Final    NO WBC SEEN RARE SQUAMOUS EPITHELIAL CELLS PRESENT FEW GRAM POSITIVE COCCI IN  PAIRS RARE GRAM NEGATIVE RODS Performed at [facility]    Culture   Final    MODERATE METHICILLIN RESISTANT STAPHYLOCOCCUS AUREUS Note: RIFAMPIN AND GENTAMICIN SHOULD NOT BE USED AS SINGLE DRUGS FOR TREATMENT OF STAPH INFECTIONS. CRITICAL RESULT CALLED TO, READ BACK BY AND VERIFIED WITH: CAROLYN OSBORNE 01/29/15 1300 BY SMITHERSJ Performed at [facility]    Report Status 01/29/2015 FINAL  Final   Organism ID, Bacteria METHICILLIN RESISTANT STAPHYLOCOCCUS AUREUS  Final      Susceptibility   Methicillin resistant staphylococcus aureus - MIC*    CLINDAMYCIN <=0.25 SENSITIVE Sensitive     ERYTHROMYCIN <=0.25 SENSITIVE Sensitive     GENTAMICIN <=0.5 SENSITIVE Sensitive     LEVOFLOXACIN 4 INTERMEDIATE Intermediate     OXACILLIN >=4 RESISTANT Resistant     RIFAMPIN <=0.5 SENSITIVE Sensitive     TRIMETH/SULFA <=10 SENSITIVE Sensitive     VANCOMYCIN 1 SENSITIVE Sensitive     TETRACYCLINE 2 SENSITIVE Sensitive     * MODERATE METHICILLIN RESISTANT STAPHYLOCOCCUS AUREUS  Anaerobic culture     Status:  None (Preliminary result)   Collection Time: 01/26/15 10:01 AM  Result Value Ref Range Status   Specimen Description WOUND LEFT FOOT  Final   Special Requests POF FORTAZ  Final   Gram Stain   Final    FEW WBC PRESENT,BOTH PMN AND MONONUCLEAR NO SQUAMOUS EPITHELIAL CELLS SEEN ABUNDANT GRAM POSITIVE COCCI IN PAIRS Performed at [facility]    Culture   Final    NO ANAEROBES ISOLATED; CULTURE IN PROGRESS FOR 5 DAYS Performed at [facility]    Report Status PENDING  Incomplete  Wound culture     Status: None (Preliminary result)   Collection Time: 01/26/15 10:01 AM  Result Value Ref Range Status   Specimen Description WOUND LEFT FOOT  Final   Special Requests POF FORTAZ  Final   Gram Stain   Final    NO WBC SEEN NO SQUAMOUS EPITHELIAL CELLS SEEN RARE GRAM POSITIVE COCCI IN PAIRS Performed at [facility]    Culture   Final    Culture reincubated for better growth Performed at [facility]    Report Status PENDING  Incomplete     Scheduled Meds: . calcitRIOL  0.25 mcg Oral QODAY  . calcium acetate  667 mg Oral BID WC  . darbepoetin (ARANESP) injection - NON-DIALYSIS  150 mcg Subcutaneous Q Mon-1800  . ferrous sulfate  325 mg Oral TID WC  . FLUoxetine  20 mg Oral q morning - 10a  . heparin  5,000 Units Subcutaneous Q8H  . insulin aspart  0-15 Units Subcutaneous TID WC  . insulin aspart  0-5 Units Subcutaneous QHS  . insulin detemir  7 Units Subcutaneous BID  . sodium bicarbonate  1,300 mg Oral BID   Continuous Infusions:    Tenishia Ekman, DO  [facility] Pager [phone]  If 7PM-7AM, please contact night-coverage [email or web address] Password [email or web address] 01/29/2015, 5:49 PM   LOS: 5 days

## 2015-01-30 DIAGNOSIS — N390 Urinary tract infection, site not specified: Secondary | ICD-10-CM

## 2015-01-30 DIAGNOSIS — E1101 Type 2 diabetes mellitus with hyperosmolarity with coma: Secondary | ICD-10-CM

## 2015-01-30 LAB — RENAL FUNCTION PANEL
Albumin: 1.6 g/dL — ABNORMAL LOW (ref 3.5–5.0)
Anion gap: 13 (ref 5–15)
BUN: 85 mg/dL — ABNORMAL HIGH (ref 6–20)
CHLORIDE: 102 mmol/L (ref 101–111)
CO2: 23 mmol/L (ref 22–32)
CREATININE: 6.05 mg/dL — AB (ref 0.44–1.00)
Calcium: 8.9 mg/dL (ref 8.9–10.3)
GFR, EST AFRICAN AMERICAN: 8 mL/min — AB (ref >60)
GFR, EST NON AFRICAN AMERICAN: 7 mL/min — AB (ref >60)
Glucose, Bld: 97 mg/dL (ref 65–99)
POTASSIUM: 4.4 mmol/L (ref 3.5–5.1)
Phosphorus: 5 mg/dL — ABNORMAL HIGH (ref 2.5–4.6)
Sodium: 138 mmol/L (ref 135–145)

## 2015-01-30 LAB — WOUND CULTURE: GRAM STAIN: NONE SEEN

## 2015-01-30 LAB — CBC
HCT: 28.2 % — ABNORMAL LOW (ref 36.0–46.0)
Hemoglobin: 8.9 g/dL — ABNORMAL LOW (ref 12.0–15.0)
MCH: 29.1 pg (ref 26.0–34.0)
MCHC: 31.6 g/dL (ref 30.0–36.0)
MCV: 92.2 fL (ref 78.0–100.0)
PLATELETS: 318 10*3/uL (ref 150–400)
RBC: 3.06 MIL/uL — ABNORMAL LOW (ref 3.87–5.11)
RDW: 13.9 % (ref 11.5–15.5)
WBC: 14.7 10*3/uL — ABNORMAL HIGH (ref 4.0–10.5)

## 2015-01-30 LAB — GLUCOSE, CAPILLARY
Glucose-Capillary: 74 mg/dL (ref 65–99)
Glucose-Capillary: 103 mg/dL — ABNORMAL HIGH (ref 65–99)
Glucose-Capillary: 209 mg/dL — ABNORMAL HIGH (ref 65–99)
Glucose-Capillary: 258 mg/dL — ABNORMAL HIGH (ref 65–99)

## 2015-01-30 LAB — PARATHYROID HORMONE, INTACT (NO CA): PTH: 50 pg/mL (ref 15–65)

## 2015-01-30 MED ORDER — CEFUROXIME AXETIL 250 MG PO TABS
250.0000 mg | ORAL_TABLET | Freq: Every day | ORAL | Status: DC
  Administered 2015-01-30 – 2015-01-31 (×2): 250 mg via ORAL
  Filled 2015-01-30 (×2): qty 1

## 2015-01-30 NOTE — Progress Notes (Signed)
[facility] Rounding Note  Subjective:   Issues with low O2 sats last night - unable to wean from nasal cannula Non-oliguric Foley in Still with some nausea, no vomiting   Objective Vital signs in last 24 hours: Filed Vitals:   01/30/15 0445 01/30/15 0446 01/30/15 0704 01/30/15 0712  BP:   112/52   Pulse:   76   Temp:   98 F (36.7 C)   TempSrc:   Oral   Resp:   16   Weight:      SpO2: 88% 92% 98% 97%   Weight change:   Intake/Output Summary (Last 24 hours) at 01/30/15 1116 Last data filed at 01/30/15 0706  Gross per 24 hour  Intake    120 ml  Output   1100 ml  Net   -980 ml    Physical Exam:  BP 112/52 mmHg  Pulse 76  Temp(Src) 98 F (36.7 C) (Oral)  Resp 16  Wt 82.555 kg (182 lb)  SpO2 97%  Gen: Chronically ill app NAD Poor dentition Chest: Clear anteriorly  Heart: regular, S1S2 No audible S3.  Abdomen: soft, no focal tenderness Ext: Left TMA wrapped. Trace edema LE's unchanged  RUA BC AVF + bruit and thrill Neuro: alert, Ox3, no focal deficit No asterixus noted   Labs: Basic Metabolic Panel:  Recent Labs Lab 01/24/15 1110 01/25/15 0620 01/26/15 0707 01/27/15 0446 01/28/15 0342 01/29/15 0742 01/30/15 0516  NA 132* 133* 130* 129* 132* 136 138  K 4.1 4.2 4.9 4.6 5.0 4.3 4.4  CL 95* 101 98* 95* 96* 99* 102  CO2 22 22 18* 19* 21* 24 23  GLUCOSE 287* 116* 96 245* 235* 122* 97  BUN 71* 76* 79* 85* 88* 87* 85*  CREATININE 4.34* 4.92* 5.19* 6.15* 6.58* 6.15* 6.05*  CALCIUM 9.3 8.3* 8.5* 8.1* 8.6* 8.7* 8.9  PHOS  --   --   --   --   --  5.0* 5.0*   Liver Function Tests:  Recent Labs Lab 01/24/15 1110 01/25/15 0620 01/29/15 0742 01/30/15 0516  AST 12* 14*  --   --   ALT 12* 7*  --   --   ALKPHOS 78 64  --   --   BILITOT 0.9 0.7  --   --   PROT 6.8 5.8*  --   --   ALBUMIN 2.9* 2.2* 1.5* 1.6*   CBC:  Recent Labs Lab 01/24/15 1110  01/27/15 0446 01/28/15 0342 01/29/15 0432 01/30/15 0516  WBC 10.7*  < > 15.2*  16.7* 15.1* 14.7*  NEUTROABS 9.5*  --   --   --   --   --   HGB 16.3*  < > 8.6* 8.9* 8.6* 8.9*  HCT 48.9*  < > 26.6* 28.6* 28.0* 28.2*  MCV 90.9  < > 90.8 91.1 92.7 92.2  PLT 126*  < > 219 310 315 318  < > = values in this interval not displayed.    Recent Labs Lab 01/29/15 1121 01/29/15 1205 01/29/15 1651 01/29/15 2112 01/30/15 0702  GLUCAP 98 106* 160* 150* 74   Results for GLEN, JOW (MRN [id number]) as of 01/30/2015 11:15  Ref. Range 01/29/2015 04:32  PTH Latest Ref Range: 15-65 pg/mL 50    Studies/Results: No results found. Medications:   . calcitRIOL  0.25 mcg Oral QODAY  . calcium acetate  667 mg Oral BID WC  . darbepoetin (ARANESP) injection - NON-DIALYSIS  150 mcg Subcutaneous Q Mon-1800  . ferrous sulfate  325 mg Oral TID WC  . FLUoxetine  20 mg Oral q morning - 10a  . heparin  5,000 Units Subcutaneous Q8H  . insulin aspart  0-15 Units Subcutaneous TID WC  . insulin aspart  0-5 Units Subcutaneous QHS  . insulin detemir  7 Units Subcutaneous BID  . sodium bicarbonate  1,300 mg Oral BID   Background 50 y.o. year-old WF with DM with triopathy, CKD 4 (followed by Dr. Jimmy Footman, had AVF RUE done 01/08/15 in anticipation of eventual need for dialysis, last seen in the office 11/29 w/creatinine 3.5 at that time), HTN, anemia (on procrit 30K every 3 weeks, dCHF who was admitted with sepsis due to a purulent cellulitis of the left foot, and underwent a left TMA by Dr. Sharol Given on 01/26/15. An abscess was noted in the forefoot that extended proximal to the ankle. She is getting IV fortaz and received vanco on 12/29 and 12/30. Vanco trough was 25 on 1/2 . Creatinine on admission was 4.34. ACE (benazepril) was held. Foley was placed d/t inability to void. UOP only 300-600/day. Has not received NSAIDS. Had some soft (109/61) BP's (meds held) though no sustained hypotension. Creatinine rose to a max of 6.5 on 1/2 and we were asked to see.   Impression/Plan 1. AKI on CKD5 - Likely  septic hemodynamic etiology, with some volume depletion on admission, soft BP's, ACE PTA, and generous vanco trough despite only 2 doses of vanco. She has had no hard indications for dialysis  and fortunately it appears that creatinine may be starting to trend back down, albeit very slowly (though no sig change in GFR) . Will follow closely for dialysis indications. (She is still very resistant to the idea of dialysis). Acidosis adequately controlled, BP OK. Continue to hold ACE. Nothing to do differently at this time 2. Anemia - on procrit 30,000 every 3 weeks as outpt. Last dosed 12/14/14. Has rec'd a dose of Fe for low tsat this adm. Hb down to 8.9. Dosed Aranesp 150 on 01/28/15. Dose weekly while here. 3. Sepsis 2/2 infected left foot - s/p left TMA - on fortaz. 2 doses vanco. Has been on hold since high trough of 25 noted on 1/2 and level still 22 yesterday. Will be on board for a bit I suspect.  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 On po calcitriol and binders.  5. DM - per primary 6. Chronic dCHF - appears compensated at this time. 7. Neurogenic bladder    Jamal Maes, MD [facility] [phone] pager 01/30/2015, 11:16 AM

## 2015-01-30 NOTE — Progress Notes (Signed)
Attempts made overnight to wean pt off O2. When sleeping, pt O2 sats in mid-high 80s on room air, low-mid 90s with 2L nasal cannula. Pt sats dropped to 88-89% if O2 decreased to 1L. Pt currently wearing nasal cannula with 2L O2, O2 sat at 92%. Nursing will continue to monitor.

## 2015-01-30 NOTE — Progress Notes (Signed)
PROGRESS NOTE  Holly [name] [id number] DOB: 03/18/1965 DOA: 01/24/2015 PCP: [name], MD   HPI on 01/24/2015 by [name], NP 50 year female patient with known diabetes with significant peripheral neuropathy, advanced chronic kidney disease stage IV with recent placement of AV fistula on 12/13, anemia of chronic kidney disease, hypertension, chronic diastolic heart failure and diabetes on insulin. Patient presented to the ED with complaints of foot pain with recent fevers and chills associated with a foul-smell. Patient reports she has recently been utilizing a compressive type dressing to the left foot as prescribed by her podiatrist. About 3 days prior to admission she began noticing pain in the foot and suspected she may have placed the dressing too tightly. When the dressing was removed she noticed swelling and discoloration of the left foot with redness as well as with maceration of the skin around the left pinky toe. Over the past 2 days she has noticed episodic fevers and chills with a MAXIMUM TEMPERATURE of 100.5, poor oral intake with nausea and vomiting. Because of her poor intake she did not utilize her typical insulin therapies. After admission, the patient was started on vancomycin and ceftazidime. Orthopedics was consulted. The patient underwent transmetatarsal agitation of her left foot performed by Dr. Sharol Given on 01/26/2015. Unfortunately, the patient's renal function continued to worsen during the hospitalization. Nephrology was consulted. Assessment/Plan: Sepsis secondary to abscess of the left foot abscess -Present at the time of admission patient had tachycardia, leukocytosis, and fever -Orthopedics, Dr. Sharol Given, consulted and appreciated, s/p left transmetatarsal amputation (01/26/15) -01/24/15 wound culture--nonsurgical setting -Continue antibiotics-vanc and fortaz, and pain control -PT to be consulted 01/27/2015-->HHPT -Blood cultures negative to  date -MRI foot--status post TMA--negative for osteomyelitis or abscess -ESR--135, CRP--30.5 -abscess from the forefoot extended proximal to the ankle as noted in Dr. Jess Barters op note -WBC still elevated but appears to have started trending down -check UA/culture--suggests UTI--start cefuroxime -01/28/15 CXR negative  Diabetes mellitus, type II, stable -Hemoglobin A1c 7.6 on 01/24/2015 -Patient reports being more compliant with insulin regimen at home or checking her sugars. -Continue Levemir 7 units bid with sliding scale  -CBG monitoring  Acute on chronic renal failure, CKD stage 4-5 -AV fistula placed 01/08/2015 -ACE inhibitor held -Patient had mild dehydration upon admission, but likely secondary to infectious process -Continue to monitor closely-->Continuing to trend down -appreciate nephrology followup -worsening due to sepsis -01/28/2015 vancomycin trough 25  Pruritis -secondary to opioids -sarna -prn atarax  Essential hypertension -BP has been soft-->improving -meds held, continue to monitor -hold Plendil, benazepril, clonidine  Thrombocytopenia -Possibly secondary to infection -Resolved, platelets 841  Chronic diastolic heart failure -ACE inhibitor held secondary to worsening renal function -Appears to be currently compensated continue to monitor intake and output, daily weights -01/28/2015--chest x-ray negative for pulmonary edema  Neurogenic bladder/urinary retention  -patient states she has not urinated  -foley placed -Renal ultrasound negative for hydronephrosis  Normocytic anemia/Anemia of CKD/iron deficiency anemia -baseline Hb 11, currently 10.4 -Continue to monitor CBC -Patient refuses oral iron supplementation -Given IV Nulecit x 1 01/28/15 -iron sat - 7% UTI/CAUTI -start cefuroxime Code Status: Full  Family Communication: No family at bedside  Disposition Plan: Admitted  Time Spent in minutes 35 minutes  Procedures  None  Consults   Orthopedic surgery, Dr. Sharol Given  DVT Prophylaxis Heparin   Procedures/Studies: Dg Chest 1 View  01/28/2015  CLINICAL DATA:  Hypoxia. EXAM: CHEST 1 VIEW COMPARISON:  11/02/2010  FINDINGS: The heart size and mediastinal contours are within normal limits. There is no evidence of pulmonary edema, consolidation, pneumothorax, nodule or pleural fluid. The visualized skeletal structures are unremarkable. IMPRESSION: No active disease. Electronically Signed   By: Aletta Edouard M.D.   On: 01/28/2015 11:30   US Renal  01/28/2015  CLINICAL DATA:  Hypertension and renal stone. EXAM: RENAL / URINARY TRACT ULTRASOUND COMPLETE COMPARISON:  CT scan 03/29/2014 FINDINGS: Right Kidney: Length: 10.2 cm. Echogenicity within normal limits. No mass or hydronephrosis visualized. Left Kidney: Length: 10.9 cm. Echogenicity within normal limits. No mass or hydronephrosis visualized. Bladder: Decompressed by Foley catheter. IMPRESSION: No evidence for hydronephrosis. Electronically Signed   By: Misty Stanley M.D.   On: 01/28/2015 11:15   Mr Foot Left Wo Contrast  01/28/2015  CLINICAL DATA:  Foot pain with fever and chills and cellulitis of the foot. Transmetatarsal amputation of the left foot on 01/26/2015. EXAM: MRI OF THE LEFT FOREFOOT WITHOUT CONTRAST TECHNIQUE: Multiplanar, multisequence MR imaging was performed. No intravenous contrast was administered. COMPARISON:  Radiographs dated 01/24/2015 FINDINGS: The patient has undergone transmetatarsal amputation. The remnants of the bases of the metatarsals and the tarsal bones appear normal. There are no joint effusions. No appreciable soft tissue abscesses. There is subcutaneous edema on the dorsum of the foot. No abnormal fluid collections at the stump. IMPRESSION: Satisfactory appearance of the left foot after a transmetatarsal amputation. No evidence of osteomyelitis or soft tissue abscess. Subcutaneous edema on the dorsum of the foot and around the lateral aspect of the ankle  could represent cellulitis. Electronically Signed   By: [name] M.D.   On: 01/28/2015 09:09   Dg Foot Complete Left  01/24/2015  CLINICAL DATA:  Soft tissue swelling with wounds laterally. EXAM: LEFT FOOT - COMPLETE 3+ VIEW COMPARISON:  December 18, 2014 FINDINGS: Frontal, oblique, and lateral views were obtained. There is extensive soft tissue air laterally in the regions overlying the fifth metatarsal, fifth proximal phalanx, and fifth distal phalanx. There is also soft tissue air in the lateral aspect of the proximal fourth digit. There is diffuse soft tissue swelling, primarily in the midfoot and forefoot regions. Air is seen the volar to the fourth MTP joint as well. There is no overt bony destruction. No erosive change. No fracture or dislocation. No appreciable joint space narrowing. There is a spur arising from the inferior calcaneus. IMPRESSION: Extensive soft tissue swelling with soft tissue air laterally, primarily involving the soft tissue surrounding the fifth metatarsal and fifth phalanges, but also with soft tissue air volar to the fourth MTP joint and lateral to the fourth proximal and middle phalanges. This air is consistent with infection with possibility of gas gangrene. No overt osteomyelitis is appreciable. No fracture or dislocation. No erosive change. There is a small calcaneal spur inferiorly. Electronically Signed   By: Lowella Grip III M.D.   On: 01/24/2015 12:03         Subjective: Patient continues to complain of itching. Denies any fevers, chills, chest pain, chest breath, nausea, vomiting, diarrhea, abdominal pain. No dysuria or hematuria. No rashes.  Objective: Filed Vitals:   01/30/15 0446 01/30/15 0704 01/30/15 0712 01/30/15 1205  BP:  112/52  118/63  Pulse:  76  74  Temp:  98 F (36.7 C)  98.5 F (36.9 C)  TempSrc:  Oral    Resp:  16  18  Weight:      SpO2: 92% 98% 97% 96%    Intake/Output Summary (  Last 24 hours) at 01/30/15 1818 Last data  filed at 01/30/15 1500  Gross per 24 hour  Intake    240 ml  Output   1200 ml  Net   -960 ml   Weight change:  Exam:   General:  Pt is alert, follows commands appropriately, not in acute distress  HEENT: No icterus, No thrush, No neck mass, [place]/AT  Cardiovascular: RRR, S1/S2, no rubs, no gallops  Respiratory: CTA bilaterally, no wheezing, no crackles, no rhonchi  Abdomen: Soft/+BS, non tender, non distended, no guarding; no HSM  Extremities: 1+LE edema, No lymphangitis, No petechiae, No rashes, no synovitis; no cyanosis or clubbing   Data Reviewed: Basic Metabolic Panel:  Recent Labs Lab 01/26/15 0707 01/27/15 0446 01/28/15 0342 01/29/15 0742 01/30/15 0516  NA 130* 129* 132* 136 138  K 4.9 4.6 5.0 4.3 4.4  CL 98* 95* 96* 99* 102  CO2 18* 19* 21* 24 23  GLUCOSE 96 245* 235* 122* 97  BUN 79* 85* 88* 87* 85*  CREATININE 5.19* 6.15* 6.58* 6.15* 6.05*  CALCIUM 8.5* 8.1* 8.6* 8.7* 8.9  PHOS  --   --   --  5.0* 5.0*   Liver Function Tests:  Recent Labs Lab 01/24/15 1110 01/25/15 0620 01/29/15 0742 01/30/15 0516  AST 12* 14*  --   --   ALT 12* 7*  --   --   ALKPHOS 78 64  --   --   BILITOT 0.9 0.7  --   --   PROT 6.8 5.8*  --   --   ALBUMIN 2.9* 2.2* 1.5* 1.6*   No results for input(s): LIPASE, AMYLASE in the last 168 hours. No results for input(s): AMMONIA in the last 168 hours. CBC:  Recent Labs Lab 01/24/15 1110  01/26/15 0707 01/27/15 0446 01/28/15 0342 01/29/15 0432 01/30/15 0516  WBC 10.7*  < > 20.7* 15.2* 16.7* 15.1* 14.7*  NEUTROABS 9.5*  --   --   --   --   --   --   HGB 16.3*  < > 10.4* 8.6* 8.9* 8.6* 8.9*  HCT 48.9*  < > 30.9* 26.6* 28.6* 28.0* 28.2*  MCV 90.9  < > 88.5 90.8 91.1 92.7 92.2  PLT 126*  < > 217 219 310 315 318  < > = values in this interval not displayed. Cardiac Enzymes: No results for input(s): CKTOTAL, CKMB, CKMBINDEX, TROPONINI in the last 168 hours. BNP: Invalid input(s): POCBNP CBG:  Recent Labs Lab  01/29/15 1651 01/29/15 2112 01/30/15 0702 01/30/15 1204 01/30/15 1621  GLUCAP 160* 150* 74 103* 209*    Recent Results (from the past 240 hour(s))  Culture, blood (routine x 2)     Status: None   Collection Time: 01/24/15 11:10 AM  Result Value Ref Range Status   Specimen Description BLOOD LEFT HAND  Final   Special Requests BOTTLES DRAWN AEROBIC AND ANAEROBIC 5CC  Final   Culture NO GROWTH 5 DAYS  Final   Report Status 01/29/2015 FINAL  Final  Culture, blood (routine x 2)     Status: None   Collection Time: 01/24/15  1:38 PM  Result Value Ref Range Status   Specimen Description BLOOD LEFT HAND  Final   Special Requests BOTTLES DRAWN AEROBIC AND ANAEROBIC 6CCS  Final   Culture NO GROWTH 5 DAYS  Final   Report Status 01/29/2015 FINAL  Final  Wound culture     Status: None   Collection Time: 01/24/15  2:05 PM  Result  Value Ref Range Status   Specimen Description WOUND  Final   Special Requests LEFT 5TH TOE  Final   Gram Stain   Final    NO WBC SEEN RARE SQUAMOUS EPITHELIAL CELLS PRESENT FEW GRAM POSITIVE COCCI IN PAIRS RARE GRAM NEGATIVE RODS Performed at [facility]    Culture   Final    MODERATE METHICILLIN RESISTANT STAPHYLOCOCCUS AUREUS Note: RIFAMPIN AND GENTAMICIN SHOULD NOT BE USED AS SINGLE DRUGS FOR TREATMENT OF STAPH INFECTIONS. CRITICAL RESULT CALLED TO, READ BACK BY AND VERIFIED WITH: CAROLYN OSBORNE 01/29/15 1300 BY SMITHERSJ Performed at [facility]    Report Status 01/29/2015 FINAL  Final   Organism ID, Bacteria METHICILLIN RESISTANT STAPHYLOCOCCUS AUREUS  Final      Susceptibility   Methicillin resistant staphylococcus aureus - MIC*    CLINDAMYCIN <=0.25 SENSITIVE Sensitive     ERYTHROMYCIN <=0.25 SENSITIVE Sensitive     GENTAMICIN <=0.5 SENSITIVE Sensitive     LEVOFLOXACIN 4 INTERMEDIATE Intermediate     OXACILLIN >=4 RESISTANT Resistant     RIFAMPIN <=0.5 SENSITIVE Sensitive     TRIMETH/SULFA <=10 SENSITIVE Sensitive     VANCOMYCIN 1  SENSITIVE Sensitive     TETRACYCLINE 2 SENSITIVE Sensitive     * MODERATE METHICILLIN RESISTANT STAPHYLOCOCCUS AUREUS  Anaerobic culture     Status: None (Preliminary result)   Collection Time: 01/26/15 10:01 AM  Result Value Ref Range Status   Specimen Description WOUND LEFT FOOT  Final   Special Requests POF FORTAZ  Final   Gram Stain   Final    FEW WBC PRESENT,BOTH PMN AND MONONUCLEAR NO SQUAMOUS EPITHELIAL CELLS SEEN ABUNDANT GRAM POSITIVE COCCI IN PAIRS Performed at [facility]    Culture   Final    NO ANAEROBES ISOLATED; CULTURE IN PROGRESS FOR 5 DAYS Performed at [facility]    Report Status PENDING  Incomplete  Wound culture     Status: None   Collection Time: 01/26/15 10:01 AM  Result Value Ref Range Status   Specimen Description WOUND LEFT FOOT  Final   Special Requests POF FORTAZ  Final   Gram Stain   Final    NO WBC SEEN NO SQUAMOUS EPITHELIAL CELLS SEEN RARE GRAM POSITIVE COCCI IN PAIRS Performed at [facility]    Culture   Final    MULTIPLE ORGANISMS PRESENT, NONE PREDOMINANT Note: NO STAPHYLOCOCCUS AUREUS ISOLATED NO GROUP A STREP (S.PYOGENES) ISOLATED Performed at [facility]    Report Status 01/30/2015 FINAL  Final  Urine culture     Status: None (Preliminary result)   Collection Time: 01/29/15  8:00 PM  Result Value Ref Range Status   Specimen Description URINE, CATHETERIZED  Final   Special Requests NONE  Final   Culture TOO YOUNG TO READ  Final   Report Status PENDING  Incomplete     Scheduled Meds: . calcitRIOL  0.25 mcg Oral QODAY  . calcium acetate  667 mg Oral BID WC  . darbepoetin (ARANESP) injection - NON-DIALYSIS  150 mcg Subcutaneous Q Mon-1800  . ferrous sulfate  325 mg Oral TID WC  . FLUoxetine  20 mg Oral q morning - 10a  . heparin  5,000 Units Subcutaneous Q8H  . insulin aspart  0-15 Units Subcutaneous TID WC  . insulin aspart  0-5 Units Subcutaneous QHS  . insulin detemir  7 Units Subcutaneous  BID  . sodium bicarbonate  1,300 mg Oral BID   Continuous Infusions:  Milliani Herrada, DO  [facility] Pager [phone]  If 7PM-7AM, please contact night-coverage [email or web address] Password [email or web address] 01/30/2015, 6:18 PM   LOS: 6 days

## 2015-01-31 DIAGNOSIS — E1122 Type 2 diabetes mellitus with diabetic chronic kidney disease: Secondary | ICD-10-CM

## 2015-01-31 DIAGNOSIS — N183 Chronic kidney disease, stage 3 (moderate): Secondary | ICD-10-CM

## 2015-01-31 LAB — URINE CULTURE: Culture: 70000

## 2015-01-31 LAB — GLUCOSE, CAPILLARY
GLUCOSE-CAPILLARY: 209 mg/dL — AB (ref 65–99)
GLUCOSE-CAPILLARY: 91 mg/dL (ref 65–99)
GLUCOSE-CAPILLARY: 124 mg/dL — AB (ref 65–99)
Glucose-Capillary: 81 mg/dL (ref 65–99)

## 2015-01-31 MED ORDER — INSULIN DETEMIR 100 UNIT/ML ~~LOC~~ SOLN
9.0000 [IU] | Freq: Two times a day (BID) | SUBCUTANEOUS | Status: DC
Start: 2015-01-31 — End: 2015-02-03
  Administered 2015-01-31 – 2015-02-02 (×5): 9 [IU] via SUBCUTANEOUS
  Filled 2015-01-31 (×7): qty 0.09

## 2015-01-31 MED ORDER — FLUCONAZOLE 100 MG PO TABS
100.0000 mg | ORAL_TABLET | Freq: Every day | ORAL | Status: DC
  Administered 2015-01-31 – 2015-02-03 (×4): 100 mg via ORAL
  Filled 2015-01-31 (×4): qty 1

## 2015-01-31 MED ORDER — POLYETHYLENE GLYCOL 3350 17 G PO PACK
17.0000 g | PACK | Freq: Every day | ORAL | Status: DC
  Administered 2015-01-31 – 2015-02-03 (×4): 17 g via ORAL
  Filled 2015-01-31 (×4): qty 1

## 2015-01-31 NOTE — Progress Notes (Signed)
Results for Holly [name], Holly [name] (MRN [id number]) as of 01/31/2015 09:41  Ref. Range 01/30/2015 16:21 01/30/2015 22:22 01/31/2015 06:36  Glucose-Capillary Latest Ref Range: 65-99 mg/dL 209 (H) 258 (H) 209 (H)  CBGs continue to run greater than 200 mg/dl. Recommend increasing Levemir to 9 units BID if CBGs continue to be elevated.  Continue Novolog MODERATE correction scale TID & HS. Will continue to monitor while in hospital. Harvel Ricks RN BSN CDE

## 2015-01-31 NOTE — Progress Notes (Signed)
Pt is sleepy but arousable. Attempting to wean pt off oxygen. Pt remains on continuous pulse ox. Pt has a foot ulcer on the bottom upper portion of R foot. Area is callous like and circular with small circular black area in the middle. No drainage. Area open to air. Entire area measures 2.5 cm width x 2.5 cm length. Small black circular area in the middle measures 1 1/4 cm in diameter and 1 cm in length. Pt has peripheral neuropathy and decreased sensation of BLE. Pt repts LBM 12/28. Will rept to MD on rounds. Pt is very cautious regarding laxatives or suppositories. Pt repts, "I had a laxative one time before at [facility] and I developed diarrhea." Pt instructed dangers of no BM for such a long period of time. (ie SBO, ileus, bowel perforation). Pt verb understanding and agrees to comply. Pt agrees to take a PO laxative.

## 2015-01-31 NOTE — Progress Notes (Signed)
Still attempting to wean pt off oxygen. Pt still desats on 1 LPM via [place]. Continuous pulse ox remains intact. Pt sleeps a lot and doesn't do IS unless verbally encouraged. Pt PO/nutritional intake is poor. Pt states that she "has had nausea for two days and is afraid to eat". Upon assessment this AM, pt denies nausea and no v/o of nausea all day today and no rept of nausea from night RN on previous shift.  Pt argued with this RN and states that this RN has been giving her nausea medications. Pt instructed regarding use and indication of all medications prior to giving meds. Pt instructed that she hasn't received nausea medications today or last night. Pt makes no eye contact and is extremely withdrawn. Will continue to monitor. Will obtain nutrition consult.

## 2015-01-31 NOTE — Progress Notes (Signed)
[facility] Rounding Note  Subjective:   No new issues Good UOP (without diuretic) No labs today  Objective Vital signs in last 24 hours: Filed Vitals:   01/30/15 1205 01/30/15 2222 01/31/15 0627 01/31/15 0648  BP: 118/63 125/58 139/71   Pulse: 74 84 81   Temp: 98.5 F (36.9 C) 98.7 F (37.1 C) 98.8 F (37.1 C)   TempSrc:  Oral Oral   Resp: 18 16 16    Weight:    79.924 kg (176 lb 3.2 oz)  SpO2: 96% 95% 96%    Weight change:   Intake/Output Summary (Last 24 hours) at 01/31/15 1135 Last data filed at 01/31/15 0700  Gross per 24 hour  Intake    170 ml  Output   1375 ml  Net  -1205 ml    Physical Exam:  BP 139/71 mmHg  Pulse 81  Temp(Src) 98.8 F (37.1 C) (Oral)  Resp 16  Wt 79.924 kg (176 lb 3.2 oz)  SpO2 96%  Gen: Chronically ill app NAD Poor dentition Chest: Clear anteriorly  Heart: regular, S1S2 No audible S3.  Abdomen: soft, no focal tenderness Ext: Left TMA wrapped. Trace edema LE's unchanged  RUA BC AVF + bruit and thrill Neuro: alert, Ox3, no focal deficit No asterixus noted   Labs:  Basic Metabolic Panel:  Recent Labs Lab 01/25/15 0620 01/26/15 0707 01/27/15 0446 01/28/15 0342 01/29/15 0742 01/30/15 0516  NA 133* 130* 129* 132* 136 138  K 4.2 4.9 4.6 5.0 4.3 4.4  CL 101 98* 95* 96* 99* 102  CO2 22 18* 19* 21* 24 23  GLUCOSE 116* 96 245* 235* 122* 97  BUN 76* 79* 85* 88* 87* 85*  CREATININE 4.92* 5.19* 6.15* 6.58* 6.15* 6.05*  CALCIUM 8.3* 8.5* 8.1* 8.6* 8.7* 8.9  PHOS  --   --   --   --  5.0* 5.0*   Liver Function Tests:  Recent Labs Lab 01/25/15 0620 01/29/15 0742 01/30/15 0516  AST 14*  --   --   ALT 7*  --   --   ALKPHOS 64  --   --   BILITOT 0.7  --   --   PROT 5.8*  --   --   ALBUMIN 2.2* 1.5* 1.6*   CBC:  Recent Labs Lab 01/27/15 0446 01/28/15 0342 01/29/15 0432 01/30/15 0516  WBC 15.2* 16.7* 15.1* 14.7*  HGB 8.6* 8.9* 8.6* 8.9*  HCT 26.6* 28.6* 28.0* 28.2*  MCV 90.8 91.1 92.7 92.2  PLT 219  310 315 318      Recent Labs Lab 01/30/15 1204 01/30/15 1621 01/30/15 2222 01/31/15 0636 01/31/15 1116  GLUCAP 103* 209* 258* 209* 91   Results for Holly [name], Holly [name] (MRN [id number]) as of 01/30/2015 11:15  Ref. Range 01/29/2015 04:32  PTH Latest Ref Range: 15-65 pg/mL 50    Studies/Results: No results found. Medications:   . calcitRIOL  0.25 mcg Oral QODAY  . calcium acetate  667 mg Oral BID WC  . cefUROXime  250 mg Oral Daily  . darbepoetin (ARANESP) injection - NON-DIALYSIS  150 mcg Subcutaneous Q Mon-1800  . ferrous sulfate  325 mg Oral TID WC  . FLUoxetine  20 mg Oral q morning - 10a  . heparin  5,000 Units Subcutaneous Q8H  . insulin aspart  0-15 Units Subcutaneous TID WC  . insulin aspart  0-5 Units Subcutaneous QHS  . insulin detemir  7 Units Subcutaneous BID  . polyethylene glycol  17 g Oral Daily  .  sodium bicarbonate  1,300 mg Oral BID   Background 50 y.o. year-old WF with DM with triopathy, CKD 4 (followed by Dr. Jimmy Footman, had AVF RUE done 01/08/15 in anticipation of eventual need for dialysis, last seen in the office 11/29 w/creatinine 3.5 at that time), HTN, anemia (on procrit 30K every 3 weeks, dCHF who was admitted with sepsis due to a purulent cellulitis of the left foot, and underwent a left TMA by Dr. Sharol Given on 01/26/15. An abscess was noted in the forefoot that extended proximal to the ankle. She is getting IV fortaz and received vanco on 12/29 and 12/30. Vanco trough was 25 on 1/2 . Creatinine on admission was 4.34. ACE (benazepril) was held. Foley was placed d/t inability to void. UOP only 300-600/day. Has not received NSAIDS. Had some soft (109/61) BP's (meds held) though no sustained hypotension. Creatinine rose to a max of 6.5 on 1/2 and we were asked to see.   Impression/Plan 1. AKI on CKD5 - Likely septic hemodynamic etiology, with some volume depletion on admission, soft BP's, ACE PTA, and generous vanco trough despite only 2 doses of vanco. She has had no  hard indications for dialysis  and fortunately it appears that creatinine may be starting to trend back down, albeit very slowly (though no sig change in GFR) . Will follow closely for dialysis indications. (She is still very resistant to the idea of dialysis). Acidosis adequately controlled, BP OK. Continue to hold ACE. Nothing to do differently at this time. No labs done today - check renal panel in th eAM 2. Anemia - on procrit 30,000 every 3 weeks as outpt. Last dosed 12/14/14. Has rec'd a dose of Fe for low tsat this adm. Hb down to 8.9. Dosed Aranesp 150 on 01/28/15. Dose weekly while here. 3. Sepsis 2/2 infected left foot - s/p left TMA - on fortaz. 2 doses vanco. Has been on hold since high trough of 25 noted on 1/2 and level still 22 on 1/3. Will be on board for a bit I suspect.  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 On po calcitriol and binders.  5. DM - per primary 6. Chronic dCHF - appears compensated at this time. 7. Neurogenic bladder   No new labs, no new recommendations.  Renal panel ordered for tomorrow.  Has had no firm indications for dialysis. Would not restart ACE inhibitor. I am not really adding anything to management at this time. Pt will need to followup with Dr. Jimmy Footman after discharge.  If renal function worsens or additional renal issues arise - please call me.  Otherwise will follow only from a distance.  Jamal Maes, MD [facility] [phone] pager 01/31/2015, 11:35 AM

## 2015-01-31 NOTE — Progress Notes (Signed)
PT Cancellation Note  Patient Details Name: Holly [name] MRN: [id number] DOB: 07-15-1965   Cancelled Treatment:    Reason Eval/Treat Not Completed: Patient declined, no reason specified (pt refused to attempt any mobility, activity, or assessment stating she is dizzy when she gets up. pt denied exercise in bed or BPPV assessment)   Melford Aase 01/31/2015, 1:24 PM Elwyn Reach, [place]

## 2015-01-31 NOTE — Progress Notes (Signed)
PROGRESS NOTE  Holly [name] [id number] DOB: Jun 18, 1965 DOA: 01/24/2015 PCP: [name], MD  HPI on 01/24/2015 by [name], NP 50 year female patient with known diabetes with significant peripheral neuropathy, advanced chronic kidney disease stage IV with recent placement of AV fistula on 12/13, anemia of chronic kidney disease, hypertension, chronic diastolic heart failure and diabetes on insulin. Patient presented to the ED with complaints of foot pain with recent fevers and chills associated with a foul-smell. Patient reports she has recently been utilizing a compressive type dressing to the left foot as prescribed by her podiatrist. About 3 days prior to admission she began noticing pain in the foot and suspected she may have placed the dressing too tightly. When the dressing was removed she noticed swelling and discoloration of the left foot with redness as well as with maceration of the skin around the left pinky toe. Over the past 2 days she has noticed episodic fevers and chills with a MAXIMUM TEMPERATURE of 100.5, poor oral intake with nausea and vomiting. Because of her poor intake she did not utilize her typical insulin therapies. After admission, the patient was started on vancomycin and ceftazidime. Orthopedics was consulted. The patient underwent transmetatarsal agitation of her left foot performed by Dr. Sharol Given on 01/26/2015. Unfortunately, the patient's renal function continued to worsen during the hospitalization. Nephrology was consulted. Assessment/Plan: Sepsis secondary to abscess of the left foot abscess -Present at the time of admission patient had tachycardia, leukocytosis, and fever -Orthopedics, Dr. Sharol Given, consulted and appreciated, s/p left transmetatarsal amputation (01/26/15) -01/24/15 wound culture--nonsurgical setting -Continue antibiotics-vanc and fortaz, and pain control -PT to be consulted 01/27/2015-->HHPT -Blood cultures negative to date -MRI  foot--status post TMA--negative for osteomyelitis or abscess -ESR--135, CRP--30.5 -abscess from the forefoot extended proximal to the ankle as noted in Dr. Jess Barters op note -WBC still elevated but appears to have started trending down -check UA/culture--suggests UTI--started fluconazole -01/28/15 CXR negative  Diabetes mellitus, type II, stable -Hemoglobin A1c 7.6 on 01/24/2015 -Patient reports being more compliant with insulin regimen at home or checking her sugars. -Increase Levemir 9 units bid with sliding scale  -CBG monitoring  Acute on chronic renal failure, CKD stage 4-5 -AV fistula placed 01/08/2015 -ACE inhibitor held -Patient had mild dehydration upon admission, but likely secondary to infectious process -Continue to monitor closely-->Continuing to trend down -appreciate nephrology followup -worsening due to sepsis -01/28/2015 vancomycin trough 25  Pruritis -secondary to opioids -sarna -prn atarax  Essential hypertension -BP has been soft-->improving -meds held, continue to monitor -hold Plendil, benazepril, clonidine  Thrombocytopenia -Possibly secondary to infection -Resolved, platelets 086  Chronic diastolic heart failure -ACE inhibitor held secondary to worsening renal function -Appears to be currently compensated continue to monitor intake and output, daily weights -01/28/2015--chest x-ray negative for pulmonary edema  Neurogenic bladder/urinary retention  -patient states she has not urinated  -foley placed -Renal ultrasound negative for hydronephrosis  Normocytic anemia/Anemia of CKD/iron deficiency anemia -baseline Hb 11, currently 10.4 -Continue to monitor CBC -Patient refuses oral iron supplementation -Given IV Nulecit x 1 01/28/15 -iron sat - 7% UTI/CAUTI -d/c cefuroxime -start fluconazole  Code Status: Full  Family Communication: No family at bedside  Disposition Plan: home when cleared by renal   Procedures  None  Consults   Orthopedic surgery, Dr. Sharol Given  DVT Prophylaxis Heparin   Procedures/Studies: Dg Chest 1 View  01/28/2015  CLINICAL DATA:  Hypoxia. EXAM: CHEST 1 VIEW COMPARISON:  11/02/2010  FINDINGS: The heart size and mediastinal contours are within normal limits. There is no evidence of pulmonary edema, consolidation, pneumothorax, nodule or pleural fluid. The visualized skeletal structures are unremarkable. IMPRESSION: No active disease. Electronically Signed   By: Aletta Edouard M.D.   On: 01/28/2015 11:30   US Renal  01/28/2015  CLINICAL DATA:  Hypertension and renal stone. EXAM: RENAL / URINARY TRACT ULTRASOUND COMPLETE COMPARISON:  CT scan 03/29/2014 FINDINGS: Right Kidney: Length: 10.2 cm. Echogenicity within normal limits. No mass or hydronephrosis visualized. Left Kidney: Length: 10.9 cm. Echogenicity within normal limits. No mass or hydronephrosis visualized. Bladder: Decompressed by Foley catheter. IMPRESSION: No evidence for hydronephrosis. Electronically Signed   By: Misty Stanley M.D.   On: 01/28/2015 11:15   Mr Foot Left Wo Contrast  01/28/2015  CLINICAL DATA:  Foot pain with fever and chills and cellulitis of the foot. Transmetatarsal amputation of the left foot on 01/26/2015. EXAM: MRI OF THE LEFT FOREFOOT WITHOUT CONTRAST TECHNIQUE: Multiplanar, multisequence MR imaging was performed. No intravenous contrast was administered. COMPARISON:  Radiographs dated 01/24/2015 FINDINGS: The patient has undergone transmetatarsal amputation. The remnants of the bases of the metatarsals and the tarsal bones appear normal. There are no joint effusions. No appreciable soft tissue abscesses. There is subcutaneous edema on the dorsum of the foot. No abnormal fluid collections at the stump. IMPRESSION: Satisfactory appearance of the left foot after a transmetatarsal amputation. No evidence of osteomyelitis or soft tissue abscess. Subcutaneous edema on the dorsum of the foot and around the lateral aspect of the ankle  could represent cellulitis. Electronically Signed   By: [name] M.D.   On: 01/28/2015 09:09   Dg Foot Complete Left  01/24/2015  CLINICAL DATA:  Soft tissue swelling with wounds laterally. EXAM: LEFT FOOT - COMPLETE 3+ VIEW COMPARISON:  December 18, 2014 FINDINGS: Frontal, oblique, and lateral views were obtained. There is extensive soft tissue air laterally in the regions overlying the fifth metatarsal, fifth proximal phalanx, and fifth distal phalanx. There is also soft tissue air in the lateral aspect of the proximal fourth digit. There is diffuse soft tissue swelling, primarily in the midfoot and forefoot regions. Air is seen the volar to the fourth MTP joint as well. There is no overt bony destruction. No erosive change. No fracture or dislocation. No appreciable joint space narrowing. There is a spur arising from the inferior calcaneus. IMPRESSION: Extensive soft tissue swelling with soft tissue air laterally, primarily involving the soft tissue surrounding the fifth metatarsal and fifth phalanges, but also with soft tissue air volar to the fourth MTP joint and lateral to the fourth proximal and middle phalanges. This air is consistent with infection with possibility of gas gangrene. No overt osteomyelitis is appreciable. No fracture or dislocation. No erosive change. There is a small calcaneal spur inferiorly. Electronically Signed   By: Lowella Grip III M.D.   On: 01/24/2015 12:03         Subjective: Patient still complains of itching but somewhat better. Denies any fevers, chills, chest pain, shortness breath, nausea, vomiting, diarrhea, abdominal pain. No dysuria or hematuria.  Objective: Filed Vitals:   01/30/15 2222 01/31/15 0627 01/31/15 0648 01/31/15 1300  BP: 125/58 139/71  126/73  Pulse: 84 81  82  Temp: 98.7 F (37.1 C) 98.8 F (37.1 C)  99 F (37.2 C)  TempSrc: Oral Oral    Resp: '16 16  16  ' Weight:   79.924 kg (176 lb 3.2 oz)   SpO2: 95%  96%  94%     Intake/Output Summary (Last 24 hours) at 01/31/15 1834 Last data filed at 01/31/15 1300  Gross per 24 hour  Intake     50 ml  Output   1175 ml  Net  -1125 ml   Weight change:  Exam:   General:  Pt is alert, follows commands appropriately, not in acute distress  HEENT: No icterus, No thrush, No neck mass, [place]/AT  Cardiovascular: RRR, S1/S2, no rubs, no gallops  Respiratory: CTA bilaterally, no wheezing, no crackles, no rhonchi  Abdomen: Soft/+BS, non tender, non distended, no guarding; no hepatosplenomegaly  Extremities: 1 +LE edema, No lymphangitis, No petechiae, No rashes, no synovitis  Data Reviewed: Basic Metabolic Panel:  Recent Labs Lab 01/26/15 0707 01/27/15 0446 01/28/15 0342 01/29/15 0742 01/30/15 0516  NA 130* 129* 132* 136 138  K 4.9 4.6 5.0 4.3 4.4  CL 98* 95* 96* 99* 102  CO2 18* 19* 21* 24 23  GLUCOSE 96 245* 235* 122* 97  BUN 79* 85* 88* 87* 85*  CREATININE 5.19* 6.15* 6.58* 6.15* 6.05*  CALCIUM 8.5* 8.1* 8.6* 8.7* 8.9  PHOS  --   --   --  5.0* 5.0*   Liver Function Tests:  Recent Labs Lab 01/25/15 0620 01/29/15 0742 01/30/15 0516  AST 14*  --   --   ALT 7*  --   --   ALKPHOS 64  --   --   BILITOT 0.7  --   --   PROT 5.8*  --   --   ALBUMIN 2.2* 1.5* 1.6*   No results for input(s): LIPASE, AMYLASE in the last 168 hours. No results for input(s): AMMONIA in the last 168 hours. CBC:  Recent Labs Lab 01/26/15 0707 01/27/15 0446 01/28/15 0342 01/29/15 0432 01/30/15 0516  WBC 20.7* 15.2* 16.7* 15.1* 14.7*  HGB 10.4* 8.6* 8.9* 8.6* 8.9*  HCT 30.9* 26.6* 28.6* 28.0* 28.2*  MCV 88.5 90.8 91.1 92.7 92.2  PLT 217 219 310 315 318   Cardiac Enzymes: No results for input(s): CKTOTAL, CKMB, CKMBINDEX, TROPONINI in the last 168 hours. BNP: Invalid input(s): POCBNP CBG:  Recent Labs Lab 01/30/15 1621 01/30/15 2222 01/31/15 0636 01/31/15 1116 01/31/15 1610  GLUCAP 209* 258* 209* 91 124*    Recent Results (from the past 240  hour(s))  Culture, blood (routine x 2)     Status: None   Collection Time: 01/24/15 11:10 AM  Result Value Ref Range Status   Specimen Description BLOOD LEFT HAND  Final   Special Requests BOTTLES DRAWN AEROBIC AND ANAEROBIC 5CC  Final   Culture NO GROWTH 5 DAYS  Final   Report Status 01/29/2015 FINAL  Final  Culture, blood (routine x 2)     Status: None   Collection Time: 01/24/15  1:38 PM  Result Value Ref Range Status   Specimen Description BLOOD LEFT HAND  Final   Special Requests BOTTLES DRAWN AEROBIC AND ANAEROBIC 6CCS  Final   Culture NO GROWTH 5 DAYS  Final   Report Status 01/29/2015 FINAL  Final  Wound culture     Status: None   Collection Time: 01/24/15  2:05 PM  Result Value Ref Range Status   Specimen Description WOUND  Final   Special Requests LEFT 5TH TOE  Final   Gram Stain   Final    NO WBC SEEN RARE SQUAMOUS EPITHELIAL CELLS PRESENT FEW GRAM POSITIVE COCCI IN PAIRS RARE GRAM NEGATIVE RODS Performed at [facility]  Culture   Final    MODERATE METHICILLIN RESISTANT STAPHYLOCOCCUS AUREUS Note: RIFAMPIN AND GENTAMICIN SHOULD NOT BE USED AS SINGLE DRUGS FOR TREATMENT OF STAPH INFECTIONS. CRITICAL RESULT CALLED TO, READ BACK BY AND VERIFIED WITH: CAROLYN OSBORNE 01/29/15 1300 BY SMITHERSJ Performed at [facility]    Report Status 01/29/2015 FINAL  Final   Organism ID, Bacteria METHICILLIN RESISTANT STAPHYLOCOCCUS AUREUS  Final      Susceptibility   Methicillin resistant staphylococcus aureus - MIC*    CLINDAMYCIN <=0.25 SENSITIVE Sensitive     ERYTHROMYCIN <=0.25 SENSITIVE Sensitive     GENTAMICIN <=0.5 SENSITIVE Sensitive     LEVOFLOXACIN 4 INTERMEDIATE Intermediate     OXACILLIN >=4 RESISTANT Resistant     RIFAMPIN <=0.5 SENSITIVE Sensitive     TRIMETH/SULFA <=10 SENSITIVE Sensitive     VANCOMYCIN 1 SENSITIVE Sensitive     TETRACYCLINE 2 SENSITIVE Sensitive     * MODERATE METHICILLIN RESISTANT STAPHYLOCOCCUS AUREUS  Anaerobic culture      Status: None (Preliminary result)   Collection Time: 01/26/15 10:01 AM  Result Value Ref Range Status   Specimen Description WOUND LEFT FOOT  Final   Special Requests POF FORTAZ  Final   Gram Stain   Final    FEW WBC PRESENT,BOTH PMN AND MONONUCLEAR NO SQUAMOUS EPITHELIAL CELLS SEEN ABUNDANT GRAM POSITIVE COCCI IN PAIRS Performed at [facility]    Culture   Final    NO ANAEROBES ISOLATED; CULTURE IN PROGRESS FOR 5 DAYS Performed at [facility]    Report Status PENDING  Incomplete  Wound culture     Status: None   Collection Time: 01/26/15 10:01 AM  Result Value Ref Range Status   Specimen Description WOUND LEFT FOOT  Final   Special Requests POF FORTAZ  Final   Gram Stain   Final    NO WBC SEEN NO SQUAMOUS EPITHELIAL CELLS SEEN RARE GRAM POSITIVE COCCI IN PAIRS Performed at [facility]    Culture   Final    MULTIPLE ORGANISMS PRESENT, NONE PREDOMINANT Note: NO STAPHYLOCOCCUS AUREUS ISOLATED NO GROUP A STREP (S.PYOGENES) ISOLATED Performed at [facility]    Report Status 01/30/2015 FINAL  Final  Urine culture     Status: None   Collection Time: 01/29/15  8:00 PM  Result Value Ref Range Status   Specimen Description URINE, CATHETERIZED  Final   Special Requests NONE  Final   Culture 70,000 COLONIES/ml YEAST  Final   Report Status 01/31/2015 FINAL  Final     Scheduled Meds: . calcitRIOL  0.25 mcg Oral QODAY  . calcium acetate  667 mg Oral BID WC  . darbepoetin (ARANESP) injection - NON-DIALYSIS  150 mcg Subcutaneous Q Mon-1800  . ferrous sulfate  325 mg Oral TID WC  . fluconazole  100 mg Oral Daily  . FLUoxetine  20 mg Oral q morning - 10a  . heparin  5,000 Units Subcutaneous Q8H  . insulin aspart  0-15 Units Subcutaneous TID WC  . insulin aspart  0-5 Units Subcutaneous QHS  . insulin detemir  7 Units Subcutaneous BID  . polyethylene glycol  17 g Oral Daily  . sodium bicarbonate  1,300 mg Oral BID   Continuous Infusions:     Eldean Nanna, DO  [facility] Pager [phone]  If 7PM-7AM, please contact night-coverage [email or web address] Password [email or web address] 01/31/2015, 6:34 PM   LOS: 7 days

## 2015-02-01 DIAGNOSIS — E1121 Type 2 diabetes mellitus with diabetic nephropathy: Secondary | ICD-10-CM

## 2015-02-01 LAB — GLUCOSE, CAPILLARY
GLUCOSE-CAPILLARY: 157 mg/dL — AB (ref 65–99)
GLUCOSE-CAPILLARY: 110 mg/dL — AB (ref 65–99)
Glucose-Capillary: 186 mg/dL — ABNORMAL HIGH (ref 65–99)
Glucose-Capillary: 89 mg/dL (ref 65–99)

## 2015-02-01 LAB — ANAEROBIC CULTURE

## 2015-02-01 LAB — CBC
HEMATOCRIT: 28.8 % — AB (ref 36.0–46.0)
HEMOGLOBIN: 8.9 g/dL — AB (ref 12.0–15.0)
MCH: 29.4 pg (ref 26.0–34.0)
MCHC: 30.9 g/dL (ref 30.0–36.0)
MCV: 95 fL (ref 78.0–100.0)
PLATELETS: 340 10*3/uL (ref 150–400)
RBC: 3.03 MIL/uL — AB (ref 3.87–5.11)
RDW: 14 % (ref 11.5–15.5)
WBC: 21.1 10*3/uL — AB (ref 4.0–10.5)

## 2015-02-01 LAB — RENAL FUNCTION PANEL
ANION GAP: 10 (ref 5–15)
Albumin: 1.8 g/dL — ABNORMAL LOW (ref 3.5–5.0)
BUN: 72 mg/dL — ABNORMAL HIGH (ref 6–20)
CHLORIDE: 102 mmol/L (ref 101–111)
CO2: 28 mmol/L (ref 22–32)
Calcium: 9.3 mg/dL (ref 8.9–10.3)
Creatinine, Ser: 5.49 mg/dL — ABNORMAL HIGH (ref 0.44–1.00)
GFR, EST AFRICAN AMERICAN: 9 mL/min — AB (ref >60)
GFR, EST NON AFRICAN AMERICAN: 8 mL/min — AB (ref >60)
Glucose, Bld: 179 mg/dL — ABNORMAL HIGH (ref 65–99)
POTASSIUM: 5.4 mmol/L — AB (ref 3.5–5.1)
Phosphorus: 4.4 mg/dL (ref 2.5–4.6)
Sodium: 140 mmol/L (ref 135–145)

## 2015-02-01 LAB — VANCOMYCIN, RANDOM: Vancomycin Rm: 13 ug/mL

## 2015-02-01 MED ORDER — NEPRO/CARBSTEADY PO LIQD
237.0000 mL | Freq: Two times a day (BID) | ORAL | Status: DC
  Administered 2015-02-02 – 2015-02-03 (×4): 237 mL via ORAL
  Filled 2015-02-01 (×7): qty 237

## 2015-02-01 MED ORDER — SODIUM CHLORIDE 0.9 % IV SOLN
INTRAVENOUS | Status: AC
Start: 2015-02-01 — End: 2015-02-02
  Administered 2015-02-01: 19:00:00 via INTRAVENOUS
  Filled 2015-02-01: qty 1000

## 2015-02-01 MED ORDER — VANCOMYCIN HCL IN DEXTROSE 750-5 MG/150ML-% IV SOLN
750.0000 mg | Freq: Once | INTRAVENOUS | Status: DC
  Filled 2015-02-01: qty 150

## 2015-02-01 MED ORDER — DOXYCYCLINE HYCLATE 100 MG PO TABS
100.0000 mg | ORAL_TABLET | Freq: Two times a day (BID) | ORAL | Status: DC
  Administered 2015-02-01 – 2015-02-03 (×4): 100 mg via ORAL
  Filled 2015-02-01 (×4): qty 1

## 2015-02-01 NOTE — Care Management Note (Signed)
Case Management Note  Patient Details  Name: Holly [name] MRN: [id number] Date of Birth: 03-Feb-1965  Subjective/Objective:      Admitted with left foot cellulitis, s/p left transmetatarsal amputation              Action/Plan: Patient is set up with [facility] for [place] visits and wheelchair and 3N1 have been delivered to patient's room. Will continue to follow for discharge needs.    Expected Discharge Date:                  Expected Discharge Plan:  [place]  In-House Referral:  NA  Discharge planning Services  CM Consult  Post Acute Care Choice:  Durable Medical Equipment, Home Health Choice offered to:  Patient  DME Arranged:  3-N-1, Wheelchair manual DME Agency:  [place]:  RN, PT [facility] Agency:  [place]  Status of Service:  In process, will continue to follow  Medicare Important Message Given:    Date Medicare IM Given:    Medicare IM give by:    Date Additional Medicare IM Given:    Additional Medicare Important Message give by:     If discussed at [place] of Stay Meetings, dates discussed:    Additional Comments:  Nila Nephew, RN 02/01/2015, 1:46 PM

## 2015-02-01 NOTE — Progress Notes (Addendum)
Pharmacy Antibiotic Follow-up Note  Dorota Keaveney Adelson is a 50 y.o. year-old female admitted on 01/24/2015.  The patient is currently on day 8 of Vancomycin for purulent cellulitis.  Assessment/Plan: 50yo female on Vancomycin, dosing by levels due to elevated Cr and reduced renal function.  Random Vanc level today is 13.  Based on previous level of 22 on 01/28/15, her calculated half-life is ~100hr.   I will give her Vancomycin 750mg  IV x 1 tonight.  Temp (24hrs), Avg:99.4 F (37.4 C), Min:98.7 F (37.1 C), Max:100.3 F (37.9 C)   Recent Labs Lab 01/27/15 0446 01/28/15 0342 01/29/15 0432 01/30/15 0516 02/01/15 0630  WBC 15.2* 16.7* 15.1* 14.7* 21.1*    Recent Labs Lab 01/27/15 0446 01/28/15 0342 01/29/15 0742 01/30/15 0516 02/01/15 0630  CREATININE 6.15* 6.58* 6.15* 6.05* 5.49*   Estimated Creatinine Clearance: 13.2 mL/min (by C-G formula based on Cr of 5.49).    Allergies  Allergen Reactions  . Bactrim Nausea And Vomiting  . Byetta 10 Mcg Pen [Exenatide] Nausea And Vomiting  . Hctz [Hydrochlorothiazide] Other (See Comments)    dizziness  . Spironolactone Other (See Comments)    Dizziness   . Sulfa Antibiotics Nausea And Vomiting    Antimicrobials this admission: 12/29 vanc > 12/29 ceftaz 2g IV q24 >1/3 1/4 ceftin (for CAUTI)>> 1/5 1/5 diflucan 1/5 >>   Microbiology results: 12/29 blood cx: ngF 12/29 wound cx: Mod MRSA 12/31 wound cx: no staph aureus, no strep F 1/3 Ucx>>70K yeast F  Thank you for allowing pharmacy to be a part of this patient's care.  Jaquita Folds PharmD 02/01/2015 5:33 PM   02/01/15 (1742)  Dr.Tat d/c'd Vancomycin, informed RN and cancelled dose.    Gracy Bruins, PharmD Clinical Pharmacist [facility]

## 2015-02-01 NOTE — Progress Notes (Signed)
Initial Nutrition Assessment  DOCUMENTATION CODES:   Not applicable  INTERVENTION:  Provide Nepro Shake po BID, each supplement provides 425 kcal and 19 grams protein.  Encourage adequate PO intake.   NUTRITION DIAGNOSIS:   Inadequate oral intake related to poor appetite as evidenced by meal completion < 50%.  GOAL:   Patient will meet greater than or equal to 90% of their needs  MONITOR:   PO intake, Supplement acceptance, Weight trends, Labs, I & O's  REASON FOR ASSESSMENT:   Consult Poor PO  ASSESSMENT:   50 year female patient with known diabetes with significant peripheral neuropathy, advanced chronic kidney disease stage IV with recent placement of AV fistula on 12/13, anemia of chronic kidney disease, hypertension, chronic diastolic heart failure and diabetes on insulin. Patient presented to the ED with complaints of foot pain with recent fevers and chills associated with a foul-smell.  PROCEDURE (12/31): Transmetatarsal amputation left foot  Pt reports having a lack of appetite since admission. Meal completion has been 0-50%. Pt reports eating well PTA with consumption of at least 3 meals daily with no other difficulties. Pt with no weight loss. Pt is agreeable to nutritional supplements to aid in caloric and protein needs. RD to order. Pt was encouraged to eat her food at meals.   Pt with no observed significant fat or muscle mass loss.   Potassium elevated at 5.4.  Diet Order:  Diet renal/carb modified with fluid restriction Diet-HS Snack?: Nothing; Room service appropriate?: Yes; Fluid consistency:: Thin  Skin:   (Incision on L foot, R arm)  Last BM:  12/28  Height:   Ht Readings from Last 1 Encounters:  01/08/15 5' 6.5" (1.689 m)    Weight:   Wt Readings from Last 1 Encounters:  02/01/15 181 lb (82.101 kg)    Ideal Body Weight:  60.22 kg  BMI:  Body mass index is 28.78 kg/(m^2).  Estimated Nutritional Needs:   Kcal:  1900-2100  Protein:   80-90 grams  Fluid:  1.2 L/day  EDUCATION NEEDS:   No education needs identified at this time  Corrin Parker, MS, RD, LDN Pager # [phone] After hours/ weekend pager # [phone]

## 2015-02-01 NOTE — Progress Notes (Signed)
PROGRESS NOTE  Holly [name] MVH:[id number] DOB: August 13, 1965 DOA: 01/24/2015 PCP: [name], MD  HPI on 01/24/2015 by [name], NP 50 year female patient with known diabetes with significant peripheral neuropathy, advanced chronic kidney disease stage IV with recent placement of AV fistula on 12/13, anemia of chronic kidney disease, hypertension, chronic diastolic heart failure and diabetes on insulin. Patient presented to the ED with complaints of foot pain with recent fevers and chills associated with a foul-smell. Patient reports she has recently been utilizing a compressive type dressing to the left foot as prescribed by her podiatrist. About 3 days prior to admission she began noticing pain in the foot and suspected she may have placed the dressing too tightly. When the dressing was removed she noticed swelling and discoloration of the left foot with redness as well as with maceration of the skin around the left pinky toe. Over the past 2 days she has noticed episodic fevers and chills with a MAXIMUM TEMPERATURE of 100.5, poor oral intake with nausea and vomiting. Because of her poor intake she did not utilize her typical insulin therapies. After admission, the patient was started on vancomycin and ceftazidime. Orthopedics was consulted. The patient underwent transmetatarsal agitation of her left foot performed by Dr. Sharol Given on 01/26/2015. Unfortunately, the patient's renal function continued to worsen during the hospitalization. Nephrology was consulted. Assessment/Plan: Sepsis secondary to abscess of the left foot abscess -Present at the time of admission patient had tachycardia, leukocytosis, and fever -Orthopedics, Dr. Sharol Given, consulted and appreciated, s/p left transmetatarsal amputation (01/26/15) -01/24/15 wound culture--nonsurgical setting -Continue antibiotics-vanc and fortaz, and pain control -PT to be consulted 01/27/2015-->HHPT -Blood cultures negative to date -MRI  foot--status post TMA--negative for osteomyelitis or abscess -ESR--135, CRP--30.5 -abscess from the forefoot extended proximal to the ankle as noted in Dr. Jess Barters op note -02/01/15-WBC still elevated--?spurious -check UA/culture--suggests UTI--started fluconazole -01/28/15 CXR negative -02/01/15--start doxycycline; duplex LLE r/o DVT  Diabetes mellitus, type II, stable -Hemoglobin A1c 7.6 on 01/24/2015 -Patient reports being more compliant with insulin regimen at home or checking her sugars. -continue Levemir 9 units bid with sliding scale  -CBG monitoring  Acute on chronic renal failure, CKD stage 4-5 -AV fistula placed 01/08/2015 -ACE inhibitor held -Patient had mild dehydration upon admission, but likely secondary to infectious process -Continue to monitor closely-->Continuing to trend down -appreciate nephrology followup -worsening due to sepsis -01/28/2015 vancomycin trough 25-->13  Pruritis -secondary to opioids -sarna -prn atarax  Essential hypertension -BP has been soft-->improving -meds held, continue to monitor -hold Plendil, benazepril, clonidine  Thrombocytopenia -Possibly secondary to infection -Resolved, platelets 841  Chronic diastolic heart failure -ACE inhibitor held secondary to worsening renal function -Appears to be currently compensated continue to monitor intake and output, daily weights -01/28/2015--chest x-ray negative for pulmonary edema  Neurogenic bladder/urinary retention  -patient states she has not urinated  -foley placed -Renal ultrasound negative for hydronephrosis  Normocytic anemia/Anemia of CKD/iron deficiency anemia -baseline Hb 11, currently 10.4 -Continue to monitor CBC -Patient refuses oral iron supplementation -Given IV Nulecit x 1 01/28/15 -iron sat - 7% UTI/CAUTI -d/c cefuroxime -start fluconazole D#2  Code Status: Full  Family Communication: No family at bedside  Disposition Plan: home when cleared by  renal    Procedures/Studies: Dg Chest 1 View  01/28/2015  CLINICAL DATA:  Hypoxia. EXAM: CHEST 1 VIEW COMPARISON:  11/02/2010 FINDINGS: The heart size and mediastinal contours are within normal limits. There is no evidence  of pulmonary edema, consolidation, pneumothorax, nodule or pleural fluid. The visualized skeletal structures are unremarkable. IMPRESSION: No active disease. Electronically Signed   By: Aletta Edouard M.D.   On: 01/28/2015 11:30   US Renal  01/28/2015  CLINICAL DATA:  Hypertension and renal stone. EXAM: RENAL / URINARY TRACT ULTRASOUND COMPLETE COMPARISON:  CT scan 03/29/2014 FINDINGS: Right Kidney: Length: 10.2 cm. Echogenicity within normal limits. No mass or hydronephrosis visualized. Left Kidney: Length: 10.9 cm. Echogenicity within normal limits. No mass or hydronephrosis visualized. Bladder: Decompressed by Foley catheter. IMPRESSION: No evidence for hydronephrosis. Electronically Signed   By: Misty Stanley M.D.   On: 01/28/2015 11:15   Mr Foot Left Wo Contrast  01/28/2015  CLINICAL DATA:  Foot pain with fever and chills and cellulitis of the foot. Transmetatarsal amputation of the left foot on 01/26/2015. EXAM: MRI OF THE LEFT FOREFOOT WITHOUT CONTRAST TECHNIQUE: Multiplanar, multisequence MR imaging was performed. No intravenous contrast was administered. COMPARISON:  Radiographs dated 01/24/2015 FINDINGS: The patient has undergone transmetatarsal amputation. The remnants of the bases of the metatarsals and the tarsal bones appear normal. There are no joint effusions. No appreciable soft tissue abscesses. There is subcutaneous edema on the dorsum of the foot. No abnormal fluid collections at the stump. IMPRESSION: Satisfactory appearance of the left foot after a transmetatarsal amputation. No evidence of osteomyelitis or soft tissue abscess. Subcutaneous edema on the dorsum of the foot and around the lateral aspect of the ankle could represent cellulitis. Electronically Signed    By: [name] M.D.   On: 01/28/2015 09:09   Dg Foot Complete Left  01/24/2015  CLINICAL DATA:  Soft tissue swelling with wounds laterally. EXAM: LEFT FOOT - COMPLETE 3+ VIEW COMPARISON:  December 18, 2014 FINDINGS: Frontal, oblique, and lateral views were obtained. There is extensive soft tissue air laterally in the regions overlying the fifth metatarsal, fifth proximal phalanx, and fifth distal phalanx. There is also soft tissue air in the lateral aspect of the proximal fourth digit. There is diffuse soft tissue swelling, primarily in the midfoot and forefoot regions. Air is seen the volar to the fourth MTP joint as well. There is no overt bony destruction. No erosive change. No fracture or dislocation. No appreciable joint space narrowing. There is a spur arising from the inferior calcaneus. IMPRESSION: Extensive soft tissue swelling with soft tissue air laterally, primarily involving the soft tissue surrounding the fifth metatarsal and fifth phalanges, but also with soft tissue air volar to the fourth MTP joint and lateral to the fourth proximal and middle phalanges. This air is consistent with infection with possibility of gas gangrene. No overt osteomyelitis is appreciable. No fracture or dislocation. No erosive change. There is a small calcaneal spur inferiorly. Electronically Signed   By: Lowella Grip III M.D.   On: 01/24/2015 12:03         Subjective: Patient denies fevers, chills, headache, chest pain, dyspnea, nausea, vomiting, diarrhea, abdominal pain, dysuria, hematuria   Objective: Filed Vitals:   01/31/15 1300 01/31/15 2144 02/01/15 0646 02/01/15 1258  BP: 126/73 138/71 135/62 126/57  Pulse: 82 83 87 81  Temp: 99 F (37.2 C) 99.1 F (37.3 C) 100.3 F (37.9 C) 98.7 F (37.1 C)  TempSrc:  Oral Oral   Resp: '16 18 16 16  ' Weight:   82.101 kg (181 lb)   SpO2: 94% 95% 94% 94%    Intake/Output Summary (Last 24 hours) at 02/01/15 1909 Last data filed at 02/01/15 1300   Gross  per 24 hour  Intake    530 ml  Output   1500 ml  Net   -970 ml   Weight change: 2.177 kg (4 lb 12.8 oz) Exam:   General:  Pt is alert, follows commands appropriately, not in acute distress  HEENT: No icterus, No thrush, No neck mass, [place]/AT  Cardiovascular: RRR, S1/S2, no rubs, no gallops  Respiratory: bibasilar rales  Abdomen: Soft/+BS, non tender, non distended, no guarding  Extremities: 1+ LLE edema, No lymphangitis, No petechiae, No rashes, no synovitis  Data Reviewed: Basic Metabolic Panel:  Recent Labs Lab 01/27/15 0446 01/28/15 0342 01/29/15 0742 01/30/15 0516 02/01/15 0630  NA 129* 132* 136 138 140  K 4.6 5.0 4.3 4.4 5.4*  CL 95* 96* 99* 102 102  CO2 19* 21* '24 23 28  ' GLUCOSE 245* 235* 122* 97 179*  BUN 85* 88* 87* 85* 72*  CREATININE 6.15* 6.58* 6.15* 6.05* 5.49*  CALCIUM 8.1* 8.6* 8.7* 8.9 9.3  PHOS  --   --  5.0* 5.0* 4.4   Liver Function Tests:  Recent Labs Lab 01/29/15 0742 01/30/15 0516 02/01/15 0630  ALBUMIN 1.5* 1.6* 1.8*   No results for input(s): LIPASE, AMYLASE in the last 168 hours. No results for input(s): AMMONIA in the last 168 hours. CBC:  Recent Labs Lab 01/27/15 0446 01/28/15 0342 01/29/15 0432 01/30/15 0516 02/01/15 0630  WBC 15.2* 16.7* 15.1* 14.7* 21.1*  HGB 8.6* 8.9* 8.6* 8.9* 8.9*  HCT 26.6* 28.6* 28.0* 28.2* 28.8*  MCV 90.8 91.1 92.7 92.2 95.0  PLT 219 310 315 318 340   Cardiac Enzymes: No results for input(s): CKTOTAL, CKMB, CKMBINDEX, TROPONINI in the last 168 hours. BNP: Invalid input(s): POCBNP CBG:  Recent Labs Lab 01/31/15 1610 01/31/15 2151 02/01/15 0645 02/01/15 1119 02/01/15 1617  GLUCAP 124* 81 186* 157* 89    Recent Results (from the past 240 hour(s))  Culture, blood (routine x 2)     Status: None   Collection Time: 01/24/15 11:10 AM  Result Value Ref Range Status   Specimen Description BLOOD LEFT HAND  Final   Special Requests BOTTLES DRAWN AEROBIC AND ANAEROBIC 5CC  Final    Culture NO GROWTH 5 DAYS  Final   Report Status 01/29/2015 FINAL  Final  Culture, blood (routine x 2)     Status: None   Collection Time: 01/24/15  1:38 PM  Result Value Ref Range Status   Specimen Description BLOOD LEFT HAND  Final   Special Requests BOTTLES DRAWN AEROBIC AND ANAEROBIC 6CCS  Final   Culture NO GROWTH 5 DAYS  Final   Report Status 01/29/2015 FINAL  Final  Wound culture     Status: None   Collection Time: 01/24/15  2:05 PM  Result Value Ref Range Status   Specimen Description WOUND  Final   Special Requests LEFT 5TH TOE  Final   Gram Stain   Final    NO WBC SEEN RARE SQUAMOUS EPITHELIAL CELLS PRESENT FEW GRAM POSITIVE COCCI IN PAIRS RARE GRAM NEGATIVE RODS Performed at [facility]    Culture   Final    MODERATE METHICILLIN RESISTANT STAPHYLOCOCCUS AUREUS Note: RIFAMPIN AND GENTAMICIN SHOULD NOT BE USED AS SINGLE DRUGS FOR TREATMENT OF STAPH INFECTIONS. CRITICAL RESULT CALLED TO, READ BACK BY AND VERIFIED WITH: CAROLYN OSBORNE 01/29/15 1300 BY SMITHERSJ Performed at [facility]    Report Status 01/29/2015 FINAL  Final   Organism ID, Bacteria METHICILLIN RESISTANT STAPHYLOCOCCUS AUREUS  Final  Susceptibility   Methicillin resistant staphylococcus aureus - MIC*    CLINDAMYCIN <=0.25 SENSITIVE Sensitive     ERYTHROMYCIN <=0.25 SENSITIVE Sensitive     GENTAMICIN <=0.5 SENSITIVE Sensitive     LEVOFLOXACIN 4 INTERMEDIATE Intermediate     OXACILLIN >=4 RESISTANT Resistant     RIFAMPIN <=0.5 SENSITIVE Sensitive     TRIMETH/SULFA <=10 SENSITIVE Sensitive     VANCOMYCIN 1 SENSITIVE Sensitive     TETRACYCLINE 2 SENSITIVE Sensitive     * MODERATE METHICILLIN RESISTANT STAPHYLOCOCCUS AUREUS  Anaerobic culture     Status: None   Collection Time: 01/26/15 10:01 AM  Result Value Ref Range Status   Specimen Description WOUND LEFT FOOT  Final   Special Requests POF FORTAZ  Final   Gram Stain   Final    FEW WBC PRESENT,BOTH PMN AND MONONUCLEAR NO  SQUAMOUS EPITHELIAL CELLS SEEN ABUNDANT GRAM POSITIVE COCCI IN PAIRS Performed at [facility]    Culture   Final    MULTIPLE ORGANISMS PRESENT, NONE PREDOMINANT Performed at [facility]    Report Status 02/01/2015 FINAL  Final  Wound culture     Status: None   Collection Time: 01/26/15 10:01 AM  Result Value Ref Range Status   Specimen Description WOUND LEFT FOOT  Final   Special Requests POF FORTAZ  Final   Gram Stain   Final    NO WBC SEEN NO SQUAMOUS EPITHELIAL CELLS SEEN RARE GRAM POSITIVE COCCI IN PAIRS Performed at [facility]    Culture   Final    MULTIPLE ORGANISMS PRESENT, NONE PREDOMINANT Note: NO STAPHYLOCOCCUS AUREUS ISOLATED NO GROUP A STREP (S.PYOGENES) ISOLATED Performed at [facility]    Report Status 01/30/2015 FINAL  Final  Urine culture     Status: None   Collection Time: 01/29/15  8:00 PM  Result Value Ref Range Status   Specimen Description URINE, CATHETERIZED  Final   Special Requests NONE  Final   Culture 70,000 COLONIES/ml YEAST  Final   Report Status 01/31/2015 FINAL  Final     Scheduled Meds: . calcitRIOL  0.25 mcg Oral QODAY  . calcium acetate  667 mg Oral BID WC  . darbepoetin (ARANESP) injection - NON-DIALYSIS  150 mcg Subcutaneous Q Mon-1800  . doxycycline  100 mg Oral Q12H  . feeding supplement (NEPRO CARB STEADY)  237 mL Oral BID BM  . ferrous sulfate  325 mg Oral TID WC  . fluconazole  100 mg Oral Daily  . FLUoxetine  20 mg Oral q morning - 10a  . heparin  5,000 Units Subcutaneous Q8H  . insulin aspart  0-15 Units Subcutaneous TID WC  . insulin aspart  0-5 Units Subcutaneous QHS  . insulin detemir  9 Units Subcutaneous BID  . polyethylene glycol  17 g Oral Daily  . sodium bicarbonate  1,300 mg Oral BID   Continuous Infusions: . sodium chloride 0.9 % 1,000 mL infusion       Fady Stamps, DO  [facility] Pager [phone]  If 7PM-7AM, please contact night-coverage [email or web address] Password  [email or web address] 02/01/2015, 7:09 PM   LOS: 8 days

## 2015-02-02 ENCOUNTER — Inpatient Hospital Stay (HOSPITAL_COMMUNITY): Payer: Commercial Managed Care - PPO

## 2015-02-02 DIAGNOSIS — R600 Localized edema: Secondary | ICD-10-CM

## 2015-02-02 DIAGNOSIS — N319 Neuromuscular dysfunction of bladder, unspecified: Secondary | ICD-10-CM

## 2015-02-02 LAB — CBC WITH DIFFERENTIAL/PLATELET
BASOS PCT: 0 %
Basophils Absolute: 0 10*3/uL (ref 0.0–0.1)
EOS ABS: 0.3 10*3/uL (ref 0.0–0.7)
Eosinophils Relative: 2 %
HCT: 29.9 % — ABNORMAL LOW (ref 36.0–46.0)
Hemoglobin: 8.7 g/dL — ABNORMAL LOW (ref 12.0–15.0)
Lymphocytes Relative: 12 %
Lymphs Abs: 2 10*3/uL (ref 0.7–4.0)
MCH: 28.2 pg (ref 26.0–34.0)
MCHC: 29.1 g/dL — ABNORMAL LOW (ref 30.0–36.0)
MCV: 97.1 fL (ref 78.0–100.0)
MONO ABS: 0.5 10*3/uL (ref 0.1–1.0)
Monocytes Relative: 3 %
NEUTROS PCT: 83 %
Neutro Abs: 13.8 10*3/uL — ABNORMAL HIGH (ref 1.7–7.7)
PLATELETS: 305 10*3/uL (ref 150–400)
RBC: 3.08 MIL/uL — ABNORMAL LOW (ref 3.87–5.11)
RDW: 14.1 % (ref 11.5–15.5)
WBC: 16.6 10*3/uL — AB (ref 4.0–10.5)

## 2015-02-02 LAB — RENAL FUNCTION PANEL
ALBUMIN: 1.9 g/dL — AB (ref 3.5–5.0)
Anion gap: 12 (ref 5–15)
BUN: 61 mg/dL — ABNORMAL HIGH (ref 6–20)
CALCIUM: 9.3 mg/dL (ref 8.9–10.3)
CO2: 28 mmol/L (ref 22–32)
CREATININE: 5.1 mg/dL — AB (ref 0.44–1.00)
Chloride: 102 mmol/L (ref 101–111)
GFR, EST AFRICAN AMERICAN: 10 mL/min — AB (ref >60)
GFR, EST NON AFRICAN AMERICAN: 9 mL/min — AB (ref >60)
Glucose, Bld: 121 mg/dL — ABNORMAL HIGH (ref 65–99)
Phosphorus: 3.6 mg/dL (ref 2.5–4.6)
Potassium: 4.5 mmol/L (ref 3.5–5.1)
SODIUM: 142 mmol/L (ref 135–145)

## 2015-02-02 LAB — GLUCOSE, CAPILLARY
GLUCOSE-CAPILLARY: 81 mg/dL (ref 65–99)
Glucose-Capillary: 93 mg/dL (ref 65–99)
Glucose-Capillary: 84 mg/dL (ref 65–99)
Glucose-Capillary: 81 mg/dL (ref 65–99)

## 2015-02-02 MED ORDER — BISACODYL 10 MG RE SUPP
10.0000 mg | Freq: Once | RECTAL | Status: AC
  Administered 2015-02-02: 10 mg via RECTAL
  Filled 2015-02-02: qty 1

## 2015-02-02 MED ORDER — SENNA 8.6 MG PO TABS
2.0000 | ORAL_TABLET | Freq: Every day | ORAL | Status: DC
  Administered 2015-02-02 – 2015-02-03 (×2): 17.2 mg via ORAL
  Filled 2015-02-02 (×2): qty 2

## 2015-02-02 NOTE — Progress Notes (Signed)
VASCULAR LAB PRELIMINARY  PRELIMINARY  PRELIMINARY  PRELIMINARY  Left lower extremity venous duplex completed.    Preliminary report:  Left:  No evidence of DVT, superficial thrombosis, or Baker's cyst.  Rolena Knutson, RVT 02/02/2015, 1:55 PM

## 2015-02-02 NOTE — Progress Notes (Signed)
PROGRESS NOTE  Holly [name] BJY:[id number] DOB: 09-09-1965 DOA: 01/24/2015 PCP: [name], MD   HPI on 01/24/2015 by [name], NP 50 year female patient with known diabetes with significant peripheral neuropathy, advanced chronic kidney disease stage IV with recent placement of AV fistula on 12/13, anemia of chronic kidney disease, hypertension, chronic diastolic heart failure and diabetes on insulin. Patient presented to the ED with complaints of foot pain with recent fevers and chills associated with a foul-smell. Patient reports she has recently been utilizing a compressive type dressing to the left foot as prescribed by her podiatrist. About 3 days prior to admission she began noticing pain in the foot and suspected she may have placed the dressing too tightly. When the dressing was removed she noticed swelling and discoloration of the left foot with redness as well as with maceration of the skin around the left pinky toe. Over the past 2 days she has noticed episodic fevers and chills with a MAXIMUM TEMPERATURE of 100.5, poor oral intake with nausea and vomiting. Because of her poor intake she did not utilize her typical insulin therapies. After admission, the patient was started on vancomycin and ceftazidime. Orthopedics was consulted. The patient underwent transmetatarsal agitation of her left foot performed by Dr. Sharol Given on 01/26/2015. Unfortunately, the patient's renal function continued to worsen during the hospitalization. Nephrology was consulted. Assessment/Plan: Sepsis secondary to abscess of the left foot abscess -Present at the time of admission patient had tachycardia, leukocytosis, and fever -Orthopedics, Dr. Sharol Given, consulted and appreciated, s/p left transmetatarsal amputation (01/26/15) -01/24/15 wound culture--nonsurgical setting -Continue antibiotics-vanc and fortaz, and pain control -PT to be consulted 01/27/2015-->HHPT -Blood cultures negative to  date -MRI foot--status post TMA--negative for osteomyelitis or abscess -ESR--135, CRP--30.5 -abscess from the forefoot extended proximal to the ankle as noted in Dr. Jess Barters op note -02/01/15-WBC still elevated--?spurious -check UA/culture--suggests UTI--started fluconazole -01/28/15 CXR negative -02/01/15--start doxycycline; duplex LLE r/o DVT  Diabetes mellitus, type II, stable -Hemoglobin A1c 7.6 on 01/24/2015 -Patient reports being more compliant with insulin regimen at home or checking her sugars. -continue Levemir 9 units bid with sliding scale  -CBG monitoring  Acute on chronic renal failure, CKD stage 4-5 -AV fistula placed 01/08/2015 -ACE inhibitor held -Patient had mild dehydration upon admission, but likely secondary to infectious process -Continue to monitor closely-->Continuing to trend down -appreciate nephrology followup -worsening due to sepsis -01/28/2015 vancomycin trough 25-->13  Pruritis -secondary to opioids -sarna -prn atarax  Essential hypertension -BP has been soft-->improving -meds held, continue to monitor -hold Plendil, benazepril, clonidine  Thrombocytopenia -Possibly secondary to infection -Resolved, platelets 086  Chronic diastolic heart failure -ACE inhibitor held secondary to worsening renal function -Appears to be currently compensated continue to monitor intake and output, daily weights -01/28/2015--chest x-ray negative for pulmonary edema  Neurogenic bladder/urinary retention  -d/c foley today and see if she can urinate -Renal ultrasound negative for hydronephrosis  Normocytic anemia/Anemia of CKD/iron deficiency anemia -baseline Hb 11, currently 10.4 -Continue to monitor CBC -Patient refuses oral iron supplementation -Given IV Nulecit x 1 01/28/15 -iron sat - 7% UTI/CAUTI -d/c cefuroxime -start fluconazole D#2  Code Status: Full  Family Communication: No family at bedside  Disposition Plan: home 02/03/15 if  stable   Procedures/Studies: Dg Chest 1 View  01/28/2015  CLINICAL DATA:  Hypoxia. EXAM: CHEST 1 VIEW COMPARISON:  11/02/2010 FINDINGS: The heart size and mediastinal contours are within normal limits. There is no evidence of  pulmonary edema, consolidation, pneumothorax, nodule or pleural fluid. The visualized skeletal structures are unremarkable. IMPRESSION: No active disease. Electronically Signed   By: Aletta Edouard M.D.   On: 01/28/2015 11:30   US Renal  01/28/2015  CLINICAL DATA:  Hypertension and renal stone. EXAM: RENAL / URINARY TRACT ULTRASOUND COMPLETE COMPARISON:  CT scan 03/29/2014 FINDINGS: Right Kidney: Length: 10.2 cm. Echogenicity within normal limits. No mass or hydronephrosis visualized. Left Kidney: Length: 10.9 cm. Echogenicity within normal limits. No mass or hydronephrosis visualized. Bladder: Decompressed by Foley catheter. IMPRESSION: No evidence for hydronephrosis. Electronically Signed   By: Misty Stanley M.D.   On: 01/28/2015 11:15   Mr Foot Left Wo Contrast  01/28/2015  CLINICAL DATA:  Foot pain with fever and chills and cellulitis of the foot. Transmetatarsal amputation of the left foot on 01/26/2015. EXAM: MRI OF THE LEFT FOREFOOT WITHOUT CONTRAST TECHNIQUE: Multiplanar, multisequence MR imaging was performed. No intravenous contrast was administered. COMPARISON:  Radiographs dated 01/24/2015 FINDINGS: The patient has undergone transmetatarsal amputation. The remnants of the bases of the metatarsals and the tarsal bones appear normal. There are no joint effusions. No appreciable soft tissue abscesses. There is subcutaneous edema on the dorsum of the foot. No abnormal fluid collections at the stump. IMPRESSION: Satisfactory appearance of the left foot after a transmetatarsal amputation. No evidence of osteomyelitis or soft tissue abscess. Subcutaneous edema on the dorsum of the foot and around the lateral aspect of the ankle could represent cellulitis. Electronically Signed    By: [name] M.D.   On: 01/28/2015 09:09   Dg Foot Complete Left  01/24/2015  CLINICAL DATA:  Soft tissue swelling with wounds laterally. EXAM: LEFT FOOT - COMPLETE 3+ VIEW COMPARISON:  December 18, 2014 FINDINGS: Frontal, oblique, and lateral views were obtained. There is extensive soft tissue air laterally in the regions overlying the fifth metatarsal, fifth proximal phalanx, and fifth distal phalanx. There is also soft tissue air in the lateral aspect of the proximal fourth digit. There is diffuse soft tissue swelling, primarily in the midfoot and forefoot regions. Air is seen the volar to the fourth MTP joint as well. There is no overt bony destruction. No erosive change. No fracture or dislocation. No appreciable joint space narrowing. There is a spur arising from the inferior calcaneus. IMPRESSION: Extensive soft tissue swelling with soft tissue air laterally, primarily involving the soft tissue surrounding the fifth metatarsal and fifth phalanges, but also with soft tissue air volar to the fourth MTP joint and lateral to the fourth proximal and middle phalanges. This air is consistent with infection with possibility of gas gangrene. No overt osteomyelitis is appreciable. No fracture or dislocation. No erosive change. There is a small calcaneal spur inferiorly. Electronically Signed   By: Lowella Grip III M.D.   On: 01/24/2015 12:03         Subjective: Patient denies fevers, chills, headache, chest pain, dyspnea, nausea, vomiting, diarrhea, abdominal pain, dysuria, hematuria   Objective: Filed Vitals:   02/01/15 1258 02/01/15 2052 02/02/15 0613 02/02/15 1152  BP: 126/57 146/68 138/63 146/64  Pulse: 81 82 75 73  Temp: 98.7 F (37.1 C) 99 F (37.2 C) 98.1 F (36.7 C) 98.2 F (36.8 C)  TempSrc:  Oral Oral   Resp: '16  18 18  ' Weight:   84.052 kg (185 lb 4.8 oz)   SpO2: 94% 98% 95% 96%    Intake/Output Summary (Last 24 hours) at 02/02/15 1746 Last data filed at 02/02/15  1153  Gross per 24 hour  Intake    240 ml  Output    950 ml  Net   -710 ml   Weight change: 1.95 kg (4 lb 4.8 oz) Exam:   General:  Pt is alert, follows commands appropriately, not in acute distress  HEENT: No icterus, No thrush, No neck mass, [place]/AT  Cardiovascular: RRR, S1/S2, no rubs, no gallops  Respiratory: CTA bilaterally, no wheezing, no crackles, no rhonchi  Abdomen: Soft/+BS, non tender, non distended, no guarding  Extremities: 1+ LLE edema, No lymphangitis, No petechiae, No rashes, no synovitis  Data Reviewed: Basic Metabolic Panel:  Recent Labs Lab 01/28/15 0342 01/29/15 0742 01/30/15 0516 02/01/15 0630 02/02/15 0415  NA 132* 136 138 140 142  K 5.0 4.3 4.4 5.4* 4.5  CL 96* 99* 102 102 102  CO2 21* '24 23 28 28  ' GLUCOSE 235* 122* 97 179* 121*  BUN 88* 87* 85* 72* 61*  CREATININE 6.58* 6.15* 6.05* 5.49* 5.10*  CALCIUM 8.6* 8.7* 8.9 9.3 9.3  PHOS  --  5.0* 5.0* 4.4 3.6   Liver Function Tests:  Recent Labs Lab 01/29/15 0742 01/30/15 0516 02/01/15 0630 02/02/15 0415  ALBUMIN 1.5* 1.6* 1.8* 1.9*   No results for input(s): LIPASE, AMYLASE in the last 168 hours. No results for input(s): AMMONIA in the last 168 hours. CBC:  Recent Labs Lab 01/28/15 0342 01/29/15 0432 01/30/15 0516 02/01/15 0630 02/02/15 0415  WBC 16.7* 15.1* 14.7* 21.1* 16.6*  NEUTROABS  --   --   --   --  13.8*  HGB 8.9* 8.6* 8.9* 8.9* 8.7*  HCT 28.6* 28.0* 28.2* 28.8* 29.9*  MCV 91.1 92.7 92.2 95.0 97.1  PLT 310 315 318 340 305   Cardiac Enzymes: No results for input(s): CKTOTAL, CKMB, CKMBINDEX, TROPONINI in the last 168 hours. BNP: Invalid input(s): POCBNP CBG:  Recent Labs Lab 02/01/15 1617 02/01/15 2150 02/02/15 0609 02/02/15 1145 02/02/15 1650  GLUCAP 89 110* 93 84 81    Recent Results (from the past 240 hour(s))  Culture, blood (routine x 2)     Status: None   Collection Time: 01/24/15 11:10 AM  Result Value Ref Range Status   Specimen Description BLOOD  LEFT HAND  Final   Special Requests BOTTLES DRAWN AEROBIC AND ANAEROBIC 5CC  Final   Culture NO GROWTH 5 DAYS  Final   Report Status 01/29/2015 FINAL  Final  Culture, blood (routine x 2)     Status: None   Collection Time: 01/24/15  1:38 PM  Result Value Ref Range Status   Specimen Description BLOOD LEFT HAND  Final   Special Requests BOTTLES DRAWN AEROBIC AND ANAEROBIC 6CCS  Final   Culture NO GROWTH 5 DAYS  Final   Report Status 01/29/2015 FINAL  Final  Wound culture     Status: None   Collection Time: 01/24/15  2:05 PM  Result Value Ref Range Status   Specimen Description WOUND  Final   Special Requests LEFT 5TH TOE  Final   Gram Stain   Final    NO WBC SEEN RARE SQUAMOUS EPITHELIAL CELLS PRESENT FEW GRAM POSITIVE COCCI IN PAIRS RARE GRAM NEGATIVE RODS Performed at [facility]    Culture   Final    MODERATE METHICILLIN RESISTANT STAPHYLOCOCCUS AUREUS Note: RIFAMPIN AND GENTAMICIN SHOULD NOT BE USED AS SINGLE DRUGS FOR TREATMENT OF STAPH INFECTIONS. CRITICAL RESULT CALLED TO, READ BACK BY AND VERIFIED WITH: CAROLYN OSBORNE 01/29/15 1300 BY SMITHERSJ Performed at [facility]  Report Status 01/29/2015 FINAL  Final   Organism ID, Bacteria METHICILLIN RESISTANT STAPHYLOCOCCUS AUREUS  Final      Susceptibility   Methicillin resistant staphylococcus aureus - MIC*    CLINDAMYCIN <=0.25 SENSITIVE Sensitive     ERYTHROMYCIN <=0.25 SENSITIVE Sensitive     GENTAMICIN <=0.5 SENSITIVE Sensitive     LEVOFLOXACIN 4 INTERMEDIATE Intermediate     OXACILLIN >=4 RESISTANT Resistant     RIFAMPIN <=0.5 SENSITIVE Sensitive     TRIMETH/SULFA <=10 SENSITIVE Sensitive     VANCOMYCIN 1 SENSITIVE Sensitive     TETRACYCLINE 2 SENSITIVE Sensitive     * MODERATE METHICILLIN RESISTANT STAPHYLOCOCCUS AUREUS  Anaerobic culture     Status: None   Collection Time: 01/26/15 10:01 AM  Result Value Ref Range Status   Specimen Description WOUND LEFT FOOT  Final   Special Requests POF  FORTAZ  Final   Gram Stain   Final    FEW WBC PRESENT,BOTH PMN AND MONONUCLEAR NO SQUAMOUS EPITHELIAL CELLS SEEN ABUNDANT GRAM POSITIVE COCCI IN PAIRS Performed at [facility]    Culture   Final    MULTIPLE ORGANISMS PRESENT, NONE PREDOMINANT Performed at [facility]    Report Status 02/01/2015 FINAL  Final  Wound culture     Status: None   Collection Time: 01/26/15 10:01 AM  Result Value Ref Range Status   Specimen Description WOUND LEFT FOOT  Final   Special Requests POF FORTAZ  Final   Gram Stain   Final    NO WBC SEEN NO SQUAMOUS EPITHELIAL CELLS SEEN RARE GRAM POSITIVE COCCI IN PAIRS Performed at [facility]    Culture   Final    MULTIPLE ORGANISMS PRESENT, NONE PREDOMINANT Note: NO STAPHYLOCOCCUS AUREUS ISOLATED NO GROUP A STREP (S.PYOGENES) ISOLATED Performed at [facility]    Report Status 01/30/2015 FINAL  Final  Urine culture     Status: None   Collection Time: 01/29/15  8:00 PM  Result Value Ref Range Status   Specimen Description URINE, CATHETERIZED  Final   Special Requests NONE  Final   Culture 70,000 COLONIES/ml YEAST  Final   Report Status 01/31/2015 FINAL  Final     Scheduled Meds: . calcitRIOL  0.25 mcg Oral QODAY  . calcium acetate  667 mg Oral BID WC  . darbepoetin (ARANESP) injection - NON-DIALYSIS  150 mcg Subcutaneous Q Mon-1800  . doxycycline  100 mg Oral Q12H  . feeding supplement (NEPRO CARB STEADY)  237 mL Oral BID BM  . ferrous sulfate  325 mg Oral TID WC  . fluconazole  100 mg Oral Daily  . FLUoxetine  20 mg Oral q morning - 10a  . heparin  5,000 Units Subcutaneous Q8H  . insulin aspart  0-15 Units Subcutaneous TID WC  . insulin aspart  0-5 Units Subcutaneous QHS  . insulin detemir  9 Units Subcutaneous BID  . polyethylene glycol  17 g Oral Daily  . senna  2 tablet Oral Daily  . sodium bicarbonate  1,300 mg Oral BID   Continuous Infusions:    Kandace Elrod, DO  [facility] Pager  [phone]  If 7PM-7AM, please contact night-coverage [email or web address] Password [email or web address] 02/02/2015, 5:46 PM   LOS: 9 days

## 2015-02-03 LAB — COMPREHENSIVE METABOLIC PANEL
ALBUMIN: 2 g/dL — AB (ref 3.5–5.0)
ALT: 11 U/L — ABNORMAL LOW (ref 14–54)
AST: 14 U/L — AB (ref 15–41)
Alkaline Phosphatase: 68 U/L (ref 38–126)
Anion gap: 10 (ref 5–15)
BUN: 53 mg/dL — AB (ref 6–20)
CHLORIDE: 102 mmol/L (ref 101–111)
CO2: 31 mmol/L (ref 22–32)
Calcium: 9.5 mg/dL (ref 8.9–10.3)
Creatinine, Ser: 4.66 mg/dL — ABNORMAL HIGH (ref 0.44–1.00)
GFR calc Af Amer: 12 mL/min — ABNORMAL LOW (ref >60)
GFR, EST NON AFRICAN AMERICAN: 10 mL/min — AB (ref >60)
Glucose, Bld: 81 mg/dL (ref 65–99)
POTASSIUM: 4.8 mmol/L (ref 3.5–5.1)
SODIUM: 143 mmol/L (ref 135–145)
Total Bilirubin: 0.5 mg/dL (ref 0.3–1.2)
Total Protein: 6.8 g/dL (ref 6.5–8.1)

## 2015-02-03 LAB — GLUCOSE, CAPILLARY
GLUCOSE-CAPILLARY: 159 mg/dL — AB (ref 65–99)
Glucose-Capillary: 59 mg/dL — ABNORMAL LOW (ref 65–99)
Glucose-Capillary: 60 mg/dL — ABNORMAL LOW (ref 65–99)
Glucose-Capillary: 146 mg/dL — ABNORMAL HIGH (ref 65–99)

## 2015-02-03 MED ORDER — NEPRO/CARBSTEADY PO LIQD: 237.0000 mL | Freq: Two times a day (BID) | ORAL | Status: DC

## 2015-02-03 MED ORDER — FLUCONAZOLE 100 MG PO TABS
100.0000 mg | ORAL_TABLET | Freq: Every day | ORAL | Status: DC
Start: 2015-02-03 — End: 2018-11-06

## 2015-02-03 MED ORDER — OXYCODONE-ACETAMINOPHEN 5-325 MG PO TABS: 1.0000 | ORAL_TABLET | Freq: Four times a day (QID) | ORAL | Status: DC | PRN

## 2015-02-03 MED ORDER — LACTULOSE 10 GM/15ML PO SOLN
20.0000 g | Freq: Once | ORAL | Status: AC
  Administered 2015-02-03: 20 g via ORAL
  Filled 2015-02-03: qty 30

## 2015-02-03 MED ORDER — DOXYCYCLINE HYCLATE 100 MG PO TABS: 100.0000 mg | ORAL_TABLET | Freq: Two times a day (BID) | ORAL | Status: DC

## 2015-02-03 MED ORDER — INSULIN DETEMIR 100 UNIT/ML ~~LOC~~ SOLN
5.0000 [IU] | Freq: Two times a day (BID) | SUBCUTANEOUS | Status: DC
  Administered 2015-02-03: 5 [IU] via SUBCUTANEOUS
  Filled 2015-02-03 (×2): qty 0.05

## 2015-02-03 MED ORDER — BISACODYL 10 MG RE SUPP
10.0000 mg | Freq: Once | RECTAL | Status: DC
  Filled 2015-02-03: qty 1

## 2015-02-03 NOTE — Progress Notes (Signed)
Accessed patient for low oxygen saturation. During rest and ambulation without oxygen, patient oxygen saturation was between 91%-96% on several attempts. As the patient drifts off to sleep, saturation declines to 87%. Informed physician as patient will not be a candidate for oxygen therapy.

## 2015-02-03 NOTE — Care Management (Signed)
Case manager received notification that patient will require oxygen for home. Documentation of saturations confirmed with bedside RN. [facility] Liaison Merry Proud ,was contacted with referral for oxygen.

## 2015-02-03 NOTE — Progress Notes (Signed)
Patient ID: Holly [name], female   DOB: 1965-07-13, 50 y.o.   MRN: [id number] No acute changes from ortho standpoint.  Left foot dressing clean and dry.  Can follow-up with Dr. Sharol Given in 1-2 weeks.

## 2015-02-03 NOTE — Progress Notes (Signed)
Patient discharged home with family. Antibiotic prescriptions were called to the pharmacy and pain medication prescriptions given to patient. Patient had equipment in room. Assessed for home oxygen therapy but was not needed.

## 2015-02-03 NOTE — Discharge Summary (Signed)
Physician Discharge Summary  Holly [name] TKW:[id number] DOB: 05/30/1965 DOA: 01/24/2015  PCP: [name], MD  Admit date: 01/24/2015 Discharge date: 02/03/2015  Recommendations for Outpatient Follow-up:  1. Pt will need to follow up with PCP in 2 weeks post discharge 2. Please obtain BMP and CBC on 02/06/15 3. Wean oxygen for saturation >92%  Discharge Diagnoses:  Sepsis secondary to abscess of the left foot abscess -Present at the time of admission patient had tachycardia, leukocytosis, and fever -Orthopedics, Dr. Sharol Given, consulted and appreciated, s/p left transmetatarsal amputation (01/26/15) -01/24/15 wound culture--NRSA -Initially on vanc and fortaz -fortaz was then stopped.  Vanco then transitioned to doxycycline when pt was stable -PT to be consulted 01/27/2015-->HHPT -Blood cultures negative to date -MRI foot--status post TMA--negative for osteomyelitis or abscess -ESR--135, CRP--30.5 -abscess from the forefoot extended proximal to the ankle as noted in Dr. Jess Barters op note -02/01/15-WBC still elevated--?spurious-->improved with recheck -check UA/culture--suggests UTI--started fluconazole -01/28/15 CXR negative infiltrates -02/01/15--start doxycycline; duplex LLE-->neg -home with 6 more days doxycycline to finish 14 days of therapy (culture= MRSA)  Diabetes mellitus, type II, stable -Hemoglobin A1c 7.6 on 01/24/2015 -Patient reports being more compliant with insulin regimen at home or checking her sugars. -continue Levemir 9 units bid with sliding scale  -home with previous home dose of Levemir -CBG monitoring  Acute on chronic renal failure, CKD stage 4-5 -AV fistula placed 01/08/2015 -ACE inhibitor held--will not restart -Patient had mild dehydration upon admission, but likely secondary to infectious process and hemodynamic factoras -appreciate nephrology followup--case discussed with Dr. Lorrene Reid 1/717-->ok to d/c with outpt followup -worsening due to  sepsis-->subsequently leveled off and improving now -serum creatinine 4.66 on day of d/c -01/28/2015 vancomycin trough 25-->13 -previous baseline creatinine 3.1-3.5 -d/c lasix, pt appears euvolemic  Pruritis -secondary to opioids -sarna -prn atarax -improved as pt took less opioids  Essential hypertension -BP has been soft-->improving -meds held, continue to monitor -hold Plendil, benazepril, clonidine  Thrombocytopenia -Possibly secondary to infection -Resolved, platelets 924  Chronic diastolic heart failure -ACE inhibitor held secondary to worsening renal function -Appears to be currently compensated continue to monitor intake and output, daily weights -01/28/2015--chest x-ray negative for pulmonary edema  Neurogenic bladder/urinary retention  -d/c foley 02/02/15-->pt able to urinate spontaneously -due to opioid use -Renal ultrasound negative for hydronephrosis  Hypoxia -due to atelectasis -oxygen sate 87-88% on RA -home with 1.5L -wean oxygen for sat >92% -incentive spirometry -CXR-- neg for edema or infiltrates  Normocytic anemia/Anemia of CKD/iron deficiency anemia -baseline Hb 11, currently 10.4 -Continue to monitor CBC -Patient refuses oral iron supplementation -Given IV Nulecit x 1 01/28/15 -iron sat - 7% UTI/CAUTI -d/c cefuroxime -d/c foley -start fluconazole D#3-->home with 4 more days  Discharge Condition: stable  Disposition: home Follow-up Information    Follow up with [facility].   Specialty:  Home Health Services   Why:  They will contact you to schedule home therapy visits.   Contact information:   [place] [address] [phone]       Follow up with DUDA,MARCUS V, MD In 1 week.   Specialty:  Orthopedic Surgery   Contact information:   [place] [address] [phone]       Diet:renal/carb modified Wt Readings from Last 3 Encounters:  02/03/15 79.924 kg (176 lb 3.2  oz)  01/08/15 77.111 kg (170 lb)  11/07/14 78.019 kg (172 lb)    History of present illness:  50 year female patient with  known diabetes with significant peripheral neuropathy, advanced chronic kidney disease stage IV with recent placement of AV fistula on 12/13, anemia of chronic kidney disease, hypertension, chronic diastolic heart failure and diabetes on insulin. Patient presented to the ED with complaints of foot pain with recent fevers and chills associated with a foul-smell. Patient reports she has recently been utilizing a compressive type dressing to the left foot as prescribed by her podiatrist. About 3 days prior to admission she began noticing pain in the foot and suspected she may have placed the dressing too tightly. When the dressing was removed she noticed swelling and discoloration of the left foot with redness as well as with maceration of the skin around the left pinky toe. Over the past 2 days she has noticed episodic fevers and chills with a MAXIMUM TEMPERATURE of 100.5, poor oral intake with nausea and vomiting. Because of her poor intake she did not utilize her typical insulin therapies. After admission, the patient was started on vancomycin and ceftazidime. Orthopedics was consulted. The patient underwent transmetatarsal agitation of her left foot performed by Dr. Sharol Given on 01/26/2015. Unfortunately, the patient's renal function continued to worsen during the hospitalization. Nephrology was consulted whom felt this was likely due to hemodynamic factors.  Pt's renal function was monitored and ultimately leveled off and began improving although not back to baseline.  Pt will follow up with outpt nephrology  Consultants: Ortho--Dr. Sharol Given Nephrology Discharge Exam: Filed Vitals:   02/02/15 2018 02/03/15 0426  BP: 135/65 153/70  Pulse: 81 84  Temp: 99 F (37.2 C) 98 F (36.7 C)  Resp: 18 18   Filed Vitals:   02/02/15 0613 02/02/15 1152 02/02/15 2018 02/03/15 0426  BP: 138/63  146/64 135/65 153/70  Pulse: 75 73 81 84  Temp: 98.1 F (36.7 C) 98.2 F (36.8 C) 99 F (37.2 C) 98 F (36.7 C)  TempSrc: Oral  Oral Oral  Resp: _0 Weight: 84.052 kg (185 lb 4.8 oz)   79.924 kg (176 lb 3.2 oz)  SpO2: 95% 96% 97% 92%   General: A&O x 3, NAD, pleasant, cooperative Cardiovascular: RRR, no rub, no gallop, no S3 Respiratory: bibasilar crackles, no wheeze Abdomen:soft, nontender, nondistended, positive bowel sounds Extremities: LLE 1+ edema, No lymphangitis, no petechiae  Discharge Instructions      Discharge Instructions    Diet - low sodium heart healthy    Complete by:  As directed      Elevate operative extremity    Complete by:  As directed      Increase activity slowly    Complete by:  As directed      Non weight bearing    Complete by:  As directed   Laterality:  left  Extremity:  Lower     Post-op shoe    Complete by:  As directed             Medication List    STOP taking these medications        benazepril 40 MG tablet  Commonly known as:  LOTENSIN     furosemide 40 MG tablet  Commonly known as:  LASIX      TAKE these medications        AURYXIA 1 GM 210 MG(Fe) Tabs  Generic drug:  Ferric Citrate  Take 210 mg by mouth 3 (three) times daily with meals.     calcitRIOL 0.25 MCG capsule  Commonly known as:  ROCALTROL  Take 0.25 mcg by  mouth every other day.     calcium acetate 667 MG capsule  Commonly known as:  PHOSLO  Take 667 mg by mouth 2 (two) times daily.     doxycycline 100 MG tablet  Commonly known as:  VIBRA-TABS  Take 1 tablet (100 mg total) by mouth every 12 (twelve) hours.     feeding supplement (NEPRO CARB STEADY) Liqd  Take 237 mLs by mouth 2 (two) times daily between meals.     felodipine 5 MG 24 hr tablet  Commonly known as:  PLENDIL  Take 5 mg by mouth at bedtime.     fluconazole 100 MG tablet  Commonly known as:  DIFLUCAN  Take 1 tablet (100 mg total) by mouth daily.     FLUoxetine 20 MG capsule   Commonly known as:  PROZAC  Take 20 mg by mouth every morning.     glucose blood test strip  Commonly known as:  ONETOUCH VERIO  1 each by Other route 2 (two) times daily. And lancets 2/day 250/.61     insulin detemir 100 UNIT/ML injection  Commonly known as:  LEVEMIR  Inject 13 Units into the skin 2 (two) times daily.     linagliptin 5 MG Tabs tablet  Commonly known as:  TRADJENTA  Take 5 mg by mouth daily.     oxyCODONE-acetaminophen 5-325 MG tablet  Commonly known as:  ROXICET  Take 1 tablet by mouth every 6 (six) hours as needed.     PROCRIT IJ  Inject as directed every 21 ( twenty-one) days.     sodium bicarbonate 650 MG tablet  Take 1,300 mg by mouth 2 (two) times daily.         The results of significant diagnostics from this hospitalization (including imaging, microbiology, ancillary and laboratory) are listed below for reference.    Significant Diagnostic Studies: Dg Chest 1 View  01/28/2015  CLINICAL DATA:  Hypoxia. EXAM: CHEST 1 VIEW COMPARISON:  11/02/2010 FINDINGS: The heart size and mediastinal contours are within normal limits. There is no evidence of pulmonary edema, consolidation, pneumothorax, nodule or pleural fluid. The visualized skeletal structures are unremarkable. IMPRESSION: No active disease. Electronically Signed   By: Aletta Edouard M.D.   On: 01/28/2015 11:30   US Renal  01/28/2015  CLINICAL DATA:  Hypertension and renal stone. EXAM: RENAL / URINARY TRACT ULTRASOUND COMPLETE COMPARISON:  CT scan 03/29/2014 FINDINGS: Right Kidney: Length: 10.2 cm. Echogenicity within normal limits. No mass or hydronephrosis visualized. Left Kidney: Length: 10.9 cm. Echogenicity within normal limits. No mass or hydronephrosis visualized. Bladder: Decompressed by Foley catheter. IMPRESSION: No evidence for hydronephrosis. Electronically Signed   By: Misty Stanley M.D.   On: 01/28/2015 11:15   Mr Foot Left Wo Contrast  01/28/2015  CLINICAL DATA:  Foot pain with fever  and chills and cellulitis of the foot. Transmetatarsal amputation of the left foot on 01/26/2015. EXAM: MRI OF THE LEFT FOREFOOT WITHOUT CONTRAST TECHNIQUE: Multiplanar, multisequence MR imaging was performed. No intravenous contrast was administered. COMPARISON:  Radiographs dated 01/24/2015 FINDINGS: The patient has undergone transmetatarsal amputation. The remnants of the bases of the metatarsals and the tarsal bones appear normal. There are no joint effusions. No appreciable soft tissue abscesses. There is subcutaneous edema on the dorsum of the foot. No abnormal fluid collections at the stump. IMPRESSION: Satisfactory appearance of the left foot after a transmetatarsal amputation. No evidence of osteomyelitis or soft tissue abscess. Subcutaneous edema on the dorsum of the foot and around the  lateral aspect of the ankle could represent cellulitis. Electronically Signed   By: [name] M.D.   On: 01/28/2015 09:09   Dg Foot Complete Left  01/24/2015  CLINICAL DATA:  Soft tissue swelling with wounds laterally. EXAM: LEFT FOOT - COMPLETE 3+ VIEW COMPARISON:  December 18, 2014 FINDINGS: Frontal, oblique, and lateral views were obtained. There is extensive soft tissue air laterally in the regions overlying the fifth metatarsal, fifth proximal phalanx, and fifth distal phalanx. There is also soft tissue air in the lateral aspect of the proximal fourth digit. There is diffuse soft tissue swelling, primarily in the midfoot and forefoot regions. Air is seen the volar to the fourth MTP joint as well. There is no overt bony destruction. No erosive change. No fracture or dislocation. No appreciable joint space narrowing. There is a spur arising from the inferior calcaneus. IMPRESSION: Extensive soft tissue swelling with soft tissue air laterally, primarily involving the soft tissue surrounding the fifth metatarsal and fifth phalanges, but also with soft tissue air volar to the fourth MTP joint and lateral to the  fourth proximal and middle phalanges. This air is consistent with infection with possibility of gas gangrene. No overt osteomyelitis is appreciable. No fracture or dislocation. No erosive change. There is a small calcaneal spur inferiorly. Electronically Signed   By: Lowella Grip III M.D.   On: 01/24/2015 12:03     Microbiology: Recent Results (from the past 240 hour(s))  Culture, blood (routine x 2)     Status: None   Collection Time: 01/24/15 11:10 AM  Result Value Ref Range Status   Specimen Description BLOOD LEFT HAND  Final   Special Requests BOTTLES DRAWN AEROBIC AND ANAEROBIC 5CC  Final   Culture NO GROWTH 5 DAYS  Final   Report Status 01/29/2015 FINAL  Final  Culture, blood (routine x 2)     Status: None   Collection Time: 01/24/15  1:38 PM  Result Value Ref Range Status   Specimen Description BLOOD LEFT HAND  Final   Special Requests BOTTLES DRAWN AEROBIC AND ANAEROBIC 6CCS  Final   Culture NO GROWTH 5 DAYS  Final   Report Status 01/29/2015 FINAL  Final  Wound culture     Status: None   Collection Time: 01/24/15  2:05 PM  Result Value Ref Range Status   Specimen Description WOUND  Final   Special Requests LEFT 5TH TOE  Final   Gram Stain   Final    NO WBC SEEN RARE SQUAMOUS EPITHELIAL CELLS PRESENT FEW GRAM POSITIVE COCCI IN PAIRS RARE GRAM NEGATIVE RODS Performed at [facility]    Culture   Final    MODERATE METHICILLIN RESISTANT STAPHYLOCOCCUS AUREUS Note: RIFAMPIN AND GENTAMICIN SHOULD NOT BE USED AS SINGLE DRUGS FOR TREATMENT OF STAPH INFECTIONS. CRITICAL RESULT CALLED TO, READ BACK BY AND VERIFIED WITH: CAROLYN OSBORNE 01/29/15 1300 BY SMITHERSJ Performed at [facility]    Report Status 01/29/2015 FINAL  Final   Organism ID, Bacteria METHICILLIN RESISTANT STAPHYLOCOCCUS AUREUS  Final      Susceptibility   Methicillin resistant staphylococcus aureus - MIC*    CLINDAMYCIN <=0.25 SENSITIVE Sensitive     ERYTHROMYCIN <=0.25 SENSITIVE Sensitive      GENTAMICIN <=0.5 SENSITIVE Sensitive     LEVOFLOXACIN 4 INTERMEDIATE Intermediate     OXACILLIN >=4 RESISTANT Resistant     RIFAMPIN <=0.5 SENSITIVE Sensitive     TRIMETH/SULFA <=10 SENSITIVE Sensitive     VANCOMYCIN 1 SENSITIVE Sensitive  TETRACYCLINE 2 SENSITIVE Sensitive     * MODERATE METHICILLIN RESISTANT STAPHYLOCOCCUS AUREUS  Anaerobic culture     Status: None   Collection Time: 01/26/15 10:01 AM  Result Value Ref Range Status   Specimen Description WOUND LEFT FOOT  Final   Special Requests POF FORTAZ  Final   Gram Stain   Final    FEW WBC PRESENT,BOTH PMN AND MONONUCLEAR NO SQUAMOUS EPITHELIAL CELLS SEEN ABUNDANT GRAM POSITIVE COCCI IN PAIRS Performed at [facility]    Culture   Final    MULTIPLE ORGANISMS PRESENT, NONE PREDOMINANT Performed at [facility]    Report Status 02/01/2015 FINAL  Final  Wound culture     Status: None   Collection Time: 01/26/15 10:01 AM  Result Value Ref Range Status   Specimen Description WOUND LEFT FOOT  Final   Special Requests POF FORTAZ  Final   Gram Stain   Final    NO WBC SEEN NO SQUAMOUS EPITHELIAL CELLS SEEN RARE GRAM POSITIVE COCCI IN PAIRS Performed at [facility]    Culture   Final    MULTIPLE ORGANISMS PRESENT, NONE PREDOMINANT Note: NO STAPHYLOCOCCUS AUREUS ISOLATED NO GROUP A STREP (S.PYOGENES) ISOLATED Performed at [facility]    Report Status 01/30/2015 FINAL  Final  Urine culture     Status: None   Collection Time: 01/29/15  8:00 PM  Result Value Ref Range Status   Specimen Description URINE, CATHETERIZED  Final   Special Requests NONE  Final   Culture 70,000 COLONIES/ml YEAST  Final   Report Status 01/31/2015 FINAL  Final     Labs: Basic Metabolic Panel:  Recent Labs Lab 01/29/15 0742 01/30/15 0516 02/01/15 0630 02/02/15 0415 02/03/15 0400  NA 136 138 140 142 143  K 4.3 4.4 5.4* 4.5 4.8  CL 99* 102 102 102 102  CO2 _0 GLUCOSE 122* 97 179* 121*  81  BUN 87* 85* 72* 61* 53*  CREATININE 6.15* 6.05* 5.49* 5.10* 4.66*  CALCIUM 8.7* 8.9 9.3 9.3 9.5  PHOS 5.0* 5.0* 4.4 3.6  --    Liver Function Tests:  Recent Labs Lab 01/29/15 0742 01/30/15 0516 02/01/15 0630 02/02/15 0415 02/03/15 0400  AST  --   --   --   --  14*  ALT  --   --   --   --  11*  ALKPHOS  --   --   --   --  68  BILITOT  --   --   --   --  0.5  PROT  --   --   --   --  6.8  ALBUMIN 1.5* 1.6* 1.8* 1.9* 2.0*   No results for input(s): LIPASE, AMYLASE in the last 168 hours. No results for input(s): AMMONIA in the last 168 hours. CBC:  Recent Labs Lab 01/28/15 0342 01/29/15 0432 01/30/15 0516 02/01/15 0630 02/02/15 0415  WBC 16.7* 15.1* 14.7* 21.1* 16.6*  NEUTROABS  --   --   --   --  13.8*  HGB 8.9* 8.6* 8.9* 8.9* 8.7*  HCT 28.6* 28.0* 28.2* 28.8* 29.9*  MCV 91.1 92.7 92.2 95.0 97.1  PLT 310 315 318 340 305   Cardiac Enzymes: No results for input(s): CKTOTAL, CKMB, CKMBINDEX, TROPONINI in the last 168 hours. BNP: Invalid input(s): POCBNP CBG:  Recent Labs Lab 02/02/15 1650 02/02/15 2148 02/03/15 0630 02/03/15 0724 02/03/15 0858  GLUCAP 81 81 60* 59* 146*    Time  coordinating discharge:  Greater than 30 minutes  Signed:  Yahye Siebert, DO [facility] Pager: [phone] 02/03/2015, 9:19 AM

## 2015-02-11 ENCOUNTER — Other Ambulatory Visit (HOSPITAL_COMMUNITY): Payer: Self-pay | Admitting: *Deleted

## 2015-02-12 ENCOUNTER — Encounter (HOSPITAL_COMMUNITY): Payer: Commercial Managed Care - PPO

## 2015-02-18 ENCOUNTER — Encounter: Payer: Self-pay | Admitting: Vascular Surgery

## 2015-02-26 ENCOUNTER — Other Ambulatory Visit (HOSPITAL_COMMUNITY): Payer: Self-pay | Admitting: *Deleted

## 2015-02-27 ENCOUNTER — Encounter: Payer: Commercial Managed Care - PPO | Admitting: Vascular Surgery

## 2015-02-27 ENCOUNTER — Encounter (HOSPITAL_COMMUNITY)
Admission: RE | Admit: 2015-02-27 | Discharge: 2015-02-27 | Disposition: A | Discharge status: Home / Self Care | Payer: Commercial Managed Care - PPO | Source: Ambulatory Visit | Attending: Nephrology | Admitting: Nephrology

## 2015-02-27 ENCOUNTER — Encounter (HOSPITAL_COMMUNITY): Payer: Commercial Managed Care - PPO

## 2015-02-27 DIAGNOSIS — N184 Chronic kidney disease, stage 4 (severe): Secondary | ICD-10-CM | POA: Insufficient documentation

## 2015-02-27 DIAGNOSIS — D631 Anemia in chronic kidney disease: Secondary | ICD-10-CM | POA: Insufficient documentation

## 2015-02-27 LAB — IRON AND TIBC
IRON: 38 ug/dL (ref 28–170)
SATURATION RATIOS: 17 % (ref 10.4–31.8)
TIBC: 221 ug/dL — ABNORMAL LOW (ref 250–450)
UIBC: 183 ug/dL

## 2015-02-27 LAB — POCT HEMOGLOBIN-HEMACUE: Hemoglobin: 9.9 g/dL — ABNORMAL LOW (ref 12.0–15.0)

## 2015-02-27 LAB — FERRITIN: Ferritin: 548 ng/mL — ABNORMAL HIGH (ref 11–307)

## 2015-02-27 MED ORDER — EPOETIN ALFA 10000 UNIT/ML IJ SOLN
INTRAMUSCULAR | Status: AC
  Administered 2015-02-27: 10000 [IU] via SUBCUTANEOUS
  Filled 2015-02-27: qty 1

## 2015-02-27 MED ORDER — EPOETIN ALFA 40000 UNIT/ML IJ SOLN: 30000.0000 [IU] | INTRAMUSCULAR | Status: DC

## 2015-02-27 MED ORDER — EPOETIN ALFA 20000 UNIT/ML IJ SOLN
INTRAMUSCULAR | Status: AC
  Administered 2015-02-27: 20000 [IU] via SUBCUTANEOUS
  Filled 2015-02-27: qty 1

## 2015-03-08 ENCOUNTER — Other Ambulatory Visit (HOSPITAL_COMMUNITY): Payer: Self-pay | Admitting: *Deleted

## 2015-03-11 ENCOUNTER — Ambulatory Visit (HOSPITAL_COMMUNITY): Payer: Commercial Managed Care - PPO

## 2015-03-19 ENCOUNTER — Encounter (HOSPITAL_COMMUNITY): Payer: Commercial Managed Care - PPO

## 2015-03-21 ENCOUNTER — Encounter: Payer: Self-pay | Admitting: Vascular Surgery

## 2015-03-21 ENCOUNTER — Other Ambulatory Visit (HOSPITAL_COMMUNITY): Payer: Self-pay

## 2015-03-22 ENCOUNTER — Ambulatory Visit (HOSPITAL_COMMUNITY)
Admission: RE | Admit: 2015-03-22 | Discharge: 2015-03-22 | Disposition: A | Discharge status: Home / Self Care | Payer: Commercial Managed Care - PPO | Source: Ambulatory Visit | Attending: Nephrology | Admitting: Nephrology

## 2015-03-22 DIAGNOSIS — Z79899 Other long term (current) drug therapy: Secondary | ICD-10-CM | POA: Diagnosis not present

## 2015-03-22 DIAGNOSIS — Z5181 Encounter for therapeutic drug level monitoring: Secondary | ICD-10-CM | POA: Diagnosis not present

## 2015-03-22 DIAGNOSIS — N184 Chronic kidney disease, stage 4 (severe): Secondary | ICD-10-CM | POA: Diagnosis present

## 2015-03-22 DIAGNOSIS — D631 Anemia in chronic kidney disease: Secondary | ICD-10-CM | POA: Insufficient documentation

## 2015-03-22 DIAGNOSIS — D509 Iron deficiency anemia, unspecified: Secondary | ICD-10-CM | POA: Diagnosis not present

## 2015-03-22 LAB — POCT HEMOGLOBIN-HEMACUE: HEMOGLOBIN: 8.1 g/dL — AB (ref 12.0–15.0)

## 2015-03-22 MED ORDER — EPOETIN ALFA 40000 UNIT/ML IJ SOLN: 30000.0000 [IU] | INTRAMUSCULAR | Status: DC

## 2015-03-22 MED ORDER — EPOETIN ALFA 10000 UNIT/ML IJ SOLN
INTRAMUSCULAR | Status: AC
  Administered 2015-03-22: 10000 [IU]
  Filled 2015-03-22: qty 1

## 2015-03-22 MED ORDER — EPOETIN ALFA 20000 UNIT/ML IJ SOLN
INTRAMUSCULAR | Status: AC
  Administered 2015-03-22: 20000 [IU]
  Filled 2015-03-22: qty 1

## 2015-03-22 MED ORDER — SODIUM CHLORIDE 0.9 % IV SOLN
510.0000 mg | INTRAVENOUS | Status: DC
  Administered 2015-03-22: 510 mg via INTRAVENOUS
  Filled 2015-03-22: qty 17

## 2015-03-27 ENCOUNTER — Encounter (HOSPITAL_COMMUNITY): Payer: Commercial Managed Care - PPO

## 2015-03-27 ENCOUNTER — Encounter: Payer: Commercial Managed Care - PPO | Admitting: Vascular Surgery

## 2015-03-29 ENCOUNTER — Ambulatory Visit (HOSPITAL_COMMUNITY): Payer: Commercial Managed Care - PPO | Attending: Nephrology

## 2015-04-22 ENCOUNTER — Encounter (HOSPITAL_COMMUNITY)
Admission: RE | Admit: 2015-04-22 | Discharge: 2015-04-22 | Disposition: A | Discharge status: Home / Self Care | Payer: Commercial Managed Care - PPO | Source: Ambulatory Visit | Attending: Nephrology | Admitting: Nephrology

## 2015-04-22 DIAGNOSIS — D631 Anemia in chronic kidney disease: Secondary | ICD-10-CM | POA: Insufficient documentation

## 2015-04-22 DIAGNOSIS — N184 Chronic kidney disease, stage 4 (severe): Secondary | ICD-10-CM | POA: Diagnosis not present

## 2015-04-22 LAB — IRON AND TIBC
IRON: 211 ug/dL — AB (ref 28–170)
Saturation Ratios: 91 % — ABNORMAL HIGH (ref 10.4–31.8)
TIBC: 232 ug/dL — AB (ref 250–450)
UIBC: 21 ug/dL

## 2015-04-22 LAB — FERRITIN: FERRITIN: 960 ng/mL — AB (ref 11–307)

## 2015-04-22 LAB — POCT HEMOGLOBIN-HEMACUE: HEMOGLOBIN: 8.6 g/dL — AB (ref 12.0–15.0)

## 2015-04-22 MED ORDER — EPOETIN ALFA 20000 UNIT/ML IJ SOLN
INTRAMUSCULAR | Status: AC
  Administered 2015-04-22: 20000 [IU]
  Filled 2015-04-22: qty 1

## 2015-04-22 MED ORDER — EPOETIN ALFA 10000 UNIT/ML IJ SOLN
INTRAMUSCULAR | Status: AC
  Administered 2015-04-22: 10000 [IU]
  Filled 2015-04-22: qty 1

## 2015-04-22 MED ORDER — EPOETIN ALFA 40000 UNIT/ML IJ SOLN: 30000.0000 [IU] | INTRAMUSCULAR | Status: DC

## 2015-05-06 ENCOUNTER — Encounter (HOSPITAL_COMMUNITY): Payer: Commercial Managed Care - PPO

## 2015-05-30 ENCOUNTER — Encounter (HOSPITAL_COMMUNITY)
Admission: RE | Admit: 2015-05-30 | Discharge: 2015-05-30 | Disposition: A | Discharge status: Home / Self Care | Payer: Commercial Managed Care - PPO | Source: Ambulatory Visit | Attending: Nephrology | Admitting: Nephrology

## 2015-05-30 DIAGNOSIS — N184 Chronic kidney disease, stage 4 (severe): Secondary | ICD-10-CM | POA: Insufficient documentation

## 2015-05-30 DIAGNOSIS — D631 Anemia in chronic kidney disease: Secondary | ICD-10-CM | POA: Diagnosis present

## 2015-05-30 LAB — POCT HEMOGLOBIN-HEMACUE: HEMOGLOBIN: 8.7 g/dL — AB (ref 12.0–15.0)

## 2015-05-30 MED ORDER — EPOETIN ALFA 40000 UNIT/ML IJ SOLN: 30000.0000 [IU] | INTRAMUSCULAR | Status: DC

## 2015-05-30 MED ORDER — EPOETIN ALFA 20000 UNIT/ML IJ SOLN
INTRAMUSCULAR | Status: AC
  Administered 2015-05-30: 20000 [IU] via SUBCUTANEOUS
  Filled 2015-05-30: qty 1

## 2015-05-30 MED ORDER — EPOETIN ALFA 10000 UNIT/ML IJ SOLN
INTRAMUSCULAR | Status: AC
  Administered 2015-05-30: 10000 [IU] via SUBCUTANEOUS
  Filled 2015-05-30: qty 1

## 2015-06-13 ENCOUNTER — Encounter (HOSPITAL_COMMUNITY)
Admission: RE | Admit: 2015-06-13 | Discharge: 2015-06-13 | Disposition: A | Discharge status: Home / Self Care | Payer: Commercial Managed Care - PPO | Source: Ambulatory Visit | Attending: Nephrology | Admitting: Nephrology

## 2015-06-13 DIAGNOSIS — D631 Anemia in chronic kidney disease: Secondary | ICD-10-CM | POA: Diagnosis not present

## 2015-06-13 LAB — FERRITIN: FERRITIN: 977 ng/mL — AB (ref 11–307)

## 2015-06-13 LAB — IRON AND TIBC
IRON: 46 ug/dL (ref 28–170)
SATURATION RATIOS: 19 % (ref 10.4–31.8)
TIBC: 245 ug/dL — AB (ref 250–450)
UIBC: 199 ug/dL

## 2015-06-13 LAB — POCT HEMOGLOBIN-HEMACUE: HEMOGLOBIN: 7.3 g/dL — AB (ref 12.0–15.0)

## 2015-06-13 MED ORDER — EPOETIN ALFA 40000 UNIT/ML IJ SOLN
INTRAMUSCULAR | Status: AC
  Filled 2015-06-13: qty 1

## 2015-06-13 MED ORDER — EPOETIN ALFA 40000 UNIT/ML IJ SOLN
40000.0000 [IU] | INTRAMUSCULAR | Status: DC
  Administered 2015-06-13: 40000 [IU] via SUBCUTANEOUS

## 2015-06-18 ENCOUNTER — Other Ambulatory Visit (HOSPITAL_COMMUNITY): Payer: Self-pay | Admitting: *Deleted

## 2015-06-19 ENCOUNTER — Ambulatory Visit (HOSPITAL_COMMUNITY)
Admission: RE | Admit: 2015-06-19 | Discharge: 2015-06-19 | Disposition: A | Discharge status: Home / Self Care | Payer: Commercial Managed Care - PPO | Source: Ambulatory Visit | Attending: Nephrology | Admitting: Nephrology

## 2015-06-19 DIAGNOSIS — D638 Anemia in other chronic diseases classified elsewhere: Secondary | ICD-10-CM | POA: Diagnosis present

## 2015-06-19 DIAGNOSIS — N184 Chronic kidney disease, stage 4 (severe): Secondary | ICD-10-CM | POA: Insufficient documentation

## 2015-06-19 MED ORDER — SODIUM CHLORIDE 0.9 % IV SOLN
510.0000 mg | INTRAVENOUS | Status: DC
  Administered 2015-06-19: 510 mg via INTRAVENOUS
  Filled 2015-06-19: qty 17

## 2015-06-19 NOTE — Discharge Instructions (Signed)
Excuse from Work, [employer], or Physical Activity ___________________[name]_______________________________ needs to be excused from: _x____ Work _____ [employer] _____ Physical activity beginning now and through the following date: 05/24/2017____________________. __x___ He or she may return to work or school but should still avoid the following physical activity or activities from now until __05/24/2017 @3  PM__________________. Activity restrictions include: _____ Lifting more than _______ lb _____ Sitting longer than __________ minutes at a time _____ Standing longer than ________ minutes at a time _____ He or she may return to full physical activity as of ____________________. Health Care Provider Name (printed): ___Renee Celesta Aver RN_____________________________________  [facility] Provider (signature): ___________________________________________ Date: _______05/24/2017_________   This information is not intended to replace advice given to you by your health care provider. Make sure you discuss any questions you have with your health care provider.   Document Released: 07/08/2000 Document Revised: 02/02/2014 Document Reviewed: 08/14/2013 Elsevier Interactive Patient Education [employer].

## 2015-06-27 ENCOUNTER — Ambulatory Visit (HOSPITAL_COMMUNITY)
Admission: RE | Admit: 2015-06-27 | Discharge: 2015-06-27 | Disposition: A | Discharge status: Home / Self Care | Payer: Commercial Managed Care - PPO | Source: Ambulatory Visit | Attending: Nephrology | Admitting: Nephrology

## 2015-06-27 DIAGNOSIS — Z79899 Other long term (current) drug therapy: Secondary | ICD-10-CM | POA: Diagnosis not present

## 2015-06-27 DIAGNOSIS — D631 Anemia in chronic kidney disease: Secondary | ICD-10-CM | POA: Diagnosis not present

## 2015-06-27 DIAGNOSIS — Z5181 Encounter for therapeutic drug level monitoring: Secondary | ICD-10-CM | POA: Diagnosis not present

## 2015-06-27 DIAGNOSIS — D509 Iron deficiency anemia, unspecified: Secondary | ICD-10-CM | POA: Diagnosis not present

## 2015-06-27 DIAGNOSIS — N184 Chronic kidney disease, stage 4 (severe): Secondary | ICD-10-CM | POA: Insufficient documentation

## 2015-06-27 LAB — POCT HEMOGLOBIN-HEMACUE: HEMOGLOBIN: 9.9 g/dL — AB (ref 12.0–15.0)

## 2015-06-27 MED ORDER — SODIUM CHLORIDE 0.9 % IV SOLN
510.0000 mg | Freq: Once | INTRAVENOUS | Status: AC
  Administered 2015-06-27: 510 mg via INTRAVENOUS
  Filled 2015-06-27: qty 17

## 2015-06-27 MED ORDER — EPOETIN ALFA 40000 UNIT/ML IJ SOLN
INTRAMUSCULAR | Status: AC
  Filled 2015-06-27: qty 1

## 2015-06-27 MED ORDER — EPOETIN ALFA 40000 UNIT/ML IJ SOLN
40000.0000 [IU] | INTRAMUSCULAR | Status: DC
  Administered 2015-06-27: 40000 [IU] via SUBCUTANEOUS

## 2015-07-08 ENCOUNTER — Encounter (HOSPITAL_COMMUNITY): Payer: Commercial Managed Care - PPO

## 2015-07-09 ENCOUNTER — Encounter (HOSPITAL_COMMUNITY): Payer: Commercial Managed Care - PPO

## 2015-07-17 ENCOUNTER — Encounter (HOSPITAL_COMMUNITY)
Admission: RE | Admit: 2015-07-17 | Discharge: 2015-07-17 | Disposition: A | Discharge status: Home / Self Care | Payer: Commercial Managed Care - PPO | Source: Ambulatory Visit | Attending: Nephrology | Admitting: Nephrology

## 2015-07-17 DIAGNOSIS — N184 Chronic kidney disease, stage 4 (severe): Secondary | ICD-10-CM | POA: Diagnosis not present

## 2015-07-17 DIAGNOSIS — D631 Anemia in chronic kidney disease: Secondary | ICD-10-CM | POA: Insufficient documentation

## 2015-07-17 LAB — FERRITIN: FERRITIN: 1479 ng/mL — AB (ref 11–307)

## 2015-07-17 LAB — IRON AND TIBC
IRON: 61 ug/dL (ref 28–170)
Saturation Ratios: 35 % — ABNORMAL HIGH (ref 10.4–31.8)
TIBC: 172 ug/dL — ABNORMAL LOW (ref 250–450)
UIBC: 111 ug/dL

## 2015-07-17 MED ORDER — EPOETIN ALFA 40000 UNIT/ML IJ SOLN: 40000.0000 [IU] | INTRAMUSCULAR | Status: DC

## 2015-07-18 LAB — POCT HEMOGLOBIN-HEMACUE: HEMOGLOBIN: 12.3 g/dL (ref 12.0–15.0)

## 2015-07-31 ENCOUNTER — Encounter (HOSPITAL_COMMUNITY): Payer: Commercial Managed Care - PPO

## 2015-08-12 ENCOUNTER — Encounter (HOSPITAL_COMMUNITY)
Admission: RE | Admit: 2015-08-12 | Discharge: 2015-08-12 | Disposition: A | Discharge status: Home / Self Care | Payer: Commercial Managed Care - PPO | Source: Ambulatory Visit | Attending: Nephrology | Admitting: Nephrology

## 2015-08-12 DIAGNOSIS — D631 Anemia in chronic kidney disease: Secondary | ICD-10-CM | POA: Diagnosis present

## 2015-08-12 DIAGNOSIS — N184 Chronic kidney disease, stage 4 (severe): Secondary | ICD-10-CM | POA: Diagnosis not present

## 2015-08-12 LAB — IRON AND TIBC
IRON: 75 ug/dL (ref 28–170)
Saturation Ratios: 30 % (ref 10.4–31.8)
TIBC: 249 ug/dL — ABNORMAL LOW (ref 250–450)
UIBC: 174 ug/dL

## 2015-08-12 LAB — POCT HEMOGLOBIN-HEMACUE: Hemoglobin: 11.6 g/dL — ABNORMAL LOW (ref 12.0–15.0)

## 2015-08-12 LAB — FERRITIN: Ferritin: 1394 ng/mL — ABNORMAL HIGH (ref 11–307)

## 2015-08-12 MED ORDER — EPOETIN ALFA 40000 UNIT/ML IJ SOLN
INTRAMUSCULAR | Status: AC
  Filled 2015-08-12: qty 1

## 2015-08-12 MED ORDER — EPOETIN ALFA 40000 UNIT/ML IJ SOLN
40000.0000 [IU] | INTRAMUSCULAR | Status: DC
  Administered 2015-08-12: 40000 [IU] via SUBCUTANEOUS

## 2015-08-27 ENCOUNTER — Encounter (HOSPITAL_COMMUNITY): Payer: Commercial Managed Care - PPO

## 2015-09-03 ENCOUNTER — Other Ambulatory Visit (HOSPITAL_COMMUNITY): Payer: Self-pay | Admitting: *Deleted

## 2015-09-04 ENCOUNTER — Encounter (HOSPITAL_COMMUNITY)
Admission: RE | Admit: 2015-09-04 | Discharge: 2015-09-04 | Disposition: A | Discharge status: Home / Self Care | Payer: Commercial Managed Care - PPO | Source: Ambulatory Visit | Attending: Nephrology | Admitting: Nephrology

## 2015-09-04 DIAGNOSIS — D631 Anemia in chronic kidney disease: Secondary | ICD-10-CM | POA: Diagnosis not present

## 2015-09-04 DIAGNOSIS — N179 Acute kidney failure, unspecified: Secondary | ICD-10-CM

## 2015-09-04 DIAGNOSIS — N184 Chronic kidney disease, stage 4 (severe): Secondary | ICD-10-CM | POA: Diagnosis not present

## 2015-09-04 LAB — IRON AND TIBC
IRON: 103 ug/dL (ref 28–170)
Saturation Ratios: 44 % — ABNORMAL HIGH (ref 10.4–31.8)
TIBC: 235 ug/dL — ABNORMAL LOW (ref 250–450)
UIBC: 132 ug/dL

## 2015-09-04 LAB — POCT HEMOGLOBIN-HEMACUE: Hemoglobin: 12.3 g/dL (ref 12.0–15.0)

## 2015-09-04 LAB — FERRITIN: FERRITIN: 1257 ng/mL — AB (ref 11–307)

## 2015-09-04 MED ORDER — EPOETIN ALFA 40000 UNIT/ML IJ SOLN: 40000.0000 [IU] | INTRAMUSCULAR | Status: DC

## 2015-09-18 ENCOUNTER — Encounter (HOSPITAL_COMMUNITY): Payer: Commercial Managed Care - PPO

## 2015-10-10 ENCOUNTER — Encounter (HOSPITAL_COMMUNITY)
Admission: RE | Admit: 2015-10-10 | Discharge: 2015-10-10 | Disposition: A | Discharge status: Home / Self Care | Payer: Commercial Managed Care - PPO | Source: Ambulatory Visit | Attending: Nephrology | Admitting: Nephrology

## 2015-10-10 DIAGNOSIS — D631 Anemia in chronic kidney disease: Secondary | ICD-10-CM | POA: Diagnosis present

## 2015-10-10 DIAGNOSIS — N184 Chronic kidney disease, stage 4 (severe): Secondary | ICD-10-CM

## 2015-10-10 DIAGNOSIS — N179 Acute kidney failure, unspecified: Secondary | ICD-10-CM

## 2015-10-10 LAB — POCT HEMOGLOBIN-HEMACUE: Hemoglobin: 10.9 g/dL — ABNORMAL LOW (ref 12.0–15.0)

## 2015-10-10 LAB — IRON AND TIBC
IRON: 95 ug/dL (ref 28–170)
SATURATION RATIOS: 43 % — AB (ref 10.4–31.8)
TIBC: 223 ug/dL — ABNORMAL LOW (ref 250–450)
UIBC: 128 ug/dL

## 2015-10-10 LAB — FERRITIN: Ferritin: 1311 ng/mL — ABNORMAL HIGH (ref 11–307)

## 2015-10-10 MED ORDER — EPOETIN ALFA 40000 UNIT/ML IJ SOLN
40000.0000 [IU] | INTRAMUSCULAR | Status: DC
  Administered 2015-10-10: 40000 [IU] via SUBCUTANEOUS

## 2015-10-10 MED ORDER — EPOETIN ALFA 40000 UNIT/ML IJ SOLN
INTRAMUSCULAR | Status: AC
  Administered 2015-10-10: 40000 [IU] via SUBCUTANEOUS
  Filled 2015-10-10: qty 1

## 2015-10-24 ENCOUNTER — Encounter (HOSPITAL_COMMUNITY): Payer: Commercial Managed Care - PPO

## 2015-11-15 ENCOUNTER — Encounter (HOSPITAL_COMMUNITY)
Admission: RE | Admit: 2015-11-15 | Discharge: 2015-11-15 | Disposition: A | Discharge status: Home / Self Care | Payer: Commercial Managed Care - PPO | Source: Ambulatory Visit | Attending: Nephrology | Admitting: Nephrology

## 2015-11-15 DIAGNOSIS — N179 Acute kidney failure, unspecified: Secondary | ICD-10-CM

## 2015-11-15 DIAGNOSIS — N184 Chronic kidney disease, stage 4 (severe): Secondary | ICD-10-CM | POA: Insufficient documentation

## 2015-11-15 DIAGNOSIS — D631 Anemia in chronic kidney disease: Secondary | ICD-10-CM | POA: Insufficient documentation

## 2015-11-15 LAB — IRON AND TIBC
IRON: 101 ug/dL (ref 28–170)
SATURATION RATIOS: 45 % — AB (ref 10.4–31.8)
TIBC: 227 ug/dL — AB (ref 250–450)
UIBC: 126 ug/dL

## 2015-11-15 LAB — FERRITIN: FERRITIN: 1153 ng/mL — AB (ref 11–307)

## 2015-11-15 LAB — POCT HEMOGLOBIN-HEMACUE: Hemoglobin: 10.3 g/dL — ABNORMAL LOW (ref 12.0–15.0)

## 2015-11-15 MED ORDER — EPOETIN ALFA 40000 UNIT/ML IJ SOLN
40000.0000 [IU] | INTRAMUSCULAR | Status: DC
  Administered 2015-11-15: 40000 [IU] via SUBCUTANEOUS

## 2015-11-15 MED ORDER — EPOETIN ALFA 40000 UNIT/ML IJ SOLN
INTRAMUSCULAR | Status: AC
  Filled 2015-11-15: qty 1

## 2015-11-27 ENCOUNTER — Encounter (HOSPITAL_COMMUNITY): Payer: Commercial Managed Care - PPO

## 2015-12-13 ENCOUNTER — Encounter (HOSPITAL_COMMUNITY)
Admission: RE | Admit: 2015-12-13 | Discharge: 2015-12-13 | Disposition: A | Discharge status: Home / Self Care | Payer: Commercial Managed Care - PPO | Source: Ambulatory Visit | Attending: Nephrology | Admitting: Nephrology

## 2015-12-13 DIAGNOSIS — N184 Chronic kidney disease, stage 4 (severe): Secondary | ICD-10-CM | POA: Insufficient documentation

## 2015-12-13 DIAGNOSIS — D631 Anemia in chronic kidney disease: Secondary | ICD-10-CM | POA: Insufficient documentation

## 2015-12-13 DIAGNOSIS — N179 Acute kidney failure, unspecified: Secondary | ICD-10-CM

## 2015-12-13 LAB — IRON AND TIBC
Iron: 98 ug/dL (ref 28–170)
Saturation Ratios: 43 % — ABNORMAL HIGH (ref 10.4–31.8)
TIBC: 230 ug/dL — ABNORMAL LOW (ref 250–450)
UIBC: 132 ug/dL

## 2015-12-13 LAB — POCT HEMOGLOBIN-HEMACUE: Hemoglobin: 12 g/dL (ref 12.0–15.0)

## 2015-12-13 LAB — FERRITIN: Ferritin: 1305 ng/mL — ABNORMAL HIGH (ref 11–307)

## 2015-12-13 MED ORDER — EPOETIN ALFA 40000 UNIT/ML IJ SOLN: 40000.0000 [IU] | INTRAMUSCULAR | Status: DC

## 2015-12-31 ENCOUNTER — Encounter (HOSPITAL_COMMUNITY): Payer: Commercial Managed Care - PPO

## 2016-01-09 ENCOUNTER — Inpatient Hospital Stay (HOSPITAL_COMMUNITY): Admission: RE | Admit: 2016-01-09 | Payer: Commercial Managed Care - PPO | Source: Ambulatory Visit

## 2016-01-22 ENCOUNTER — Encounter (HOSPITAL_COMMUNITY)
Admission: RE | Admit: 2016-01-22 | Discharge: 2016-01-22 | Disposition: A | Discharge status: Home / Self Care | Payer: Commercial Managed Care - PPO | Source: Ambulatory Visit | Attending: Nephrology | Admitting: Nephrology

## 2016-01-22 DIAGNOSIS — N179 Acute kidney failure, unspecified: Secondary | ICD-10-CM

## 2016-01-22 DIAGNOSIS — N184 Chronic kidney disease, stage 4 (severe): Secondary | ICD-10-CM | POA: Diagnosis not present

## 2016-01-22 DIAGNOSIS — D631 Anemia in chronic kidney disease: Secondary | ICD-10-CM | POA: Insufficient documentation

## 2016-01-22 LAB — IRON AND TIBC
IRON: 111 ug/dL (ref 28–170)
SATURATION RATIOS: 50 % — AB (ref 10.4–31.8)
TIBC: 224 ug/dL — AB (ref 250–450)
UIBC: 113 ug/dL

## 2016-01-22 LAB — FERRITIN: FERRITIN: 1220 ng/mL — AB (ref 11–307)

## 2016-01-22 LAB — POCT HEMOGLOBIN-HEMACUE: Hemoglobin: 10.5 g/dL — ABNORMAL LOW (ref 12.0–15.0)

## 2016-01-22 MED ORDER — EPOETIN ALFA 40000 UNIT/ML IJ SOLN
40000.0000 [IU] | INTRAMUSCULAR | Status: DC
  Administered 2016-01-22: 40000 [IU] via SUBCUTANEOUS

## 2016-01-22 MED ORDER — EPOETIN ALFA 40000 UNIT/ML IJ SOLN
INTRAMUSCULAR | Status: AC
  Administered 2016-01-22: 40000 [IU] via SUBCUTANEOUS
  Filled 2016-01-22: qty 1

## 2016-02-05 ENCOUNTER — Encounter (HOSPITAL_COMMUNITY)
Admission: RE | Admit: 2016-02-05 | Discharge: 2016-02-05 | Disposition: A | Discharge status: Home / Self Care | Payer: Commercial Managed Care - PPO | Source: Ambulatory Visit | Attending: Nephrology | Admitting: Nephrology

## 2016-02-05 DIAGNOSIS — N179 Acute kidney failure, unspecified: Secondary | ICD-10-CM

## 2016-02-05 DIAGNOSIS — N184 Chronic kidney disease, stage 4 (severe): Secondary | ICD-10-CM | POA: Diagnosis not present

## 2016-02-05 DIAGNOSIS — D631 Anemia in chronic kidney disease: Secondary | ICD-10-CM | POA: Diagnosis not present

## 2016-02-05 LAB — POCT HEMOGLOBIN-HEMACUE: HEMOGLOBIN: 11.4 g/dL — AB (ref 12.0–15.0)

## 2016-02-05 MED ORDER — EPOETIN ALFA 40000 UNIT/ML IJ SOLN
40000.0000 [IU] | INTRAMUSCULAR | Status: DC
  Administered 2016-02-05: 40000 [IU] via SUBCUTANEOUS

## 2016-02-05 MED ORDER — EPOETIN ALFA 40000 UNIT/ML IJ SOLN
INTRAMUSCULAR | Status: AC
  Administered 2016-02-05: 40000 [IU] via SUBCUTANEOUS
  Filled 2016-02-05: qty 1

## 2016-02-07 DIAGNOSIS — L03031 Cellulitis of right toe: Secondary | ICD-10-CM | POA: Diagnosis not present

## 2016-02-07 DIAGNOSIS — E1142 Type 2 diabetes mellitus with diabetic polyneuropathy: Secondary | ICD-10-CM | POA: Diagnosis not present

## 2016-02-07 DIAGNOSIS — L97512 Non-pressure chronic ulcer of other part of right foot with fat layer exposed: Secondary | ICD-10-CM | POA: Diagnosis not present

## 2016-02-18 ENCOUNTER — Other Ambulatory Visit (HOSPITAL_COMMUNITY): Payer: Self-pay | Admitting: *Deleted

## 2016-02-19 ENCOUNTER — Encounter (HOSPITAL_COMMUNITY): Payer: Commercial Managed Care - PPO

## 2016-02-28 ENCOUNTER — Encounter (HOSPITAL_COMMUNITY)
Admission: RE | Admit: 2016-02-28 | Discharge: 2016-02-28 | Disposition: A | Discharge status: Home / Self Care | Payer: Commercial Managed Care - PPO | Source: Ambulatory Visit | Attending: Nephrology | Admitting: Nephrology

## 2016-02-28 DIAGNOSIS — N179 Acute kidney failure, unspecified: Secondary | ICD-10-CM

## 2016-02-28 DIAGNOSIS — D631 Anemia in chronic kidney disease: Secondary | ICD-10-CM | POA: Insufficient documentation

## 2016-02-28 DIAGNOSIS — N184 Chronic kidney disease, stage 4 (severe): Secondary | ICD-10-CM | POA: Insufficient documentation

## 2016-02-28 LAB — IRON AND TIBC
Iron: 122 ug/dL (ref 28–170)
SATURATION RATIOS: 48 % — AB (ref 10.4–31.8)
TIBC: 256 ug/dL (ref 250–450)
UIBC: 134 ug/dL

## 2016-02-28 LAB — POCT HEMOGLOBIN-HEMACUE: HEMOGLOBIN: 13.6 g/dL (ref 12.0–15.0)

## 2016-02-28 LAB — FERRITIN: FERRITIN: 992 ng/mL — AB (ref 11–307)

## 2016-02-28 MED ORDER — EPOETIN ALFA 40000 UNIT/ML IJ SOLN: 40000.0000 [IU] | INTRAMUSCULAR | Status: DC

## 2016-03-23 DIAGNOSIS — E1121 Type 2 diabetes mellitus with diabetic nephropathy: Secondary | ICD-10-CM | POA: Diagnosis not present

## 2016-03-23 DIAGNOSIS — I10 Essential (primary) hypertension: Secondary | ICD-10-CM | POA: Diagnosis not present

## 2016-03-23 DIAGNOSIS — R0989 Other specified symptoms and signs involving the circulatory and respiratory systems: Secondary | ICD-10-CM | POA: Diagnosis not present

## 2016-03-24 DIAGNOSIS — N2581 Secondary hyperparathyroidism of renal origin: Secondary | ICD-10-CM | POA: Diagnosis not present

## 2016-03-24 DIAGNOSIS — N184 Chronic kidney disease, stage 4 (severe): Secondary | ICD-10-CM | POA: Diagnosis not present

## 2016-03-24 DIAGNOSIS — I129 Hypertensive chronic kidney disease with stage 1 through stage 4 chronic kidney disease, or unspecified chronic kidney disease: Secondary | ICD-10-CM | POA: Diagnosis not present

## 2016-03-24 DIAGNOSIS — D631 Anemia in chronic kidney disease: Secondary | ICD-10-CM | POA: Diagnosis not present

## 2016-03-25 ENCOUNTER — Other Ambulatory Visit: Payer: Self-pay | Admitting: Physician Assistant

## 2016-03-25 DIAGNOSIS — R0989 Other specified symptoms and signs involving the circulatory and respiratory systems: Secondary | ICD-10-CM

## 2016-03-31 DIAGNOSIS — E1121 Type 2 diabetes mellitus with diabetic nephropathy: Secondary | ICD-10-CM | POA: Diagnosis not present

## 2016-03-31 DIAGNOSIS — I10 Essential (primary) hypertension: Secondary | ICD-10-CM | POA: Diagnosis not present

## 2016-03-31 DIAGNOSIS — Z794 Long term (current) use of insulin: Secondary | ICD-10-CM | POA: Diagnosis not present

## 2016-04-06 ENCOUNTER — Encounter (HOSPITAL_COMMUNITY): Payer: Commercial Managed Care - PPO

## 2016-04-09 ENCOUNTER — Other Ambulatory Visit: Payer: Commercial Managed Care - PPO

## 2016-04-10 ENCOUNTER — Encounter (HOSPITAL_COMMUNITY)
Admission: RE | Admit: 2016-04-10 | Discharge: 2016-04-10 | Disposition: A | Discharge status: Home / Self Care | Payer: Commercial Managed Care - PPO | Source: Ambulatory Visit | Attending: Nephrology | Admitting: Nephrology

## 2016-04-10 DIAGNOSIS — D631 Anemia in chronic kidney disease: Secondary | ICD-10-CM | POA: Insufficient documentation

## 2016-04-10 DIAGNOSIS — N184 Chronic kidney disease, stage 4 (severe): Secondary | ICD-10-CM | POA: Diagnosis not present

## 2016-04-10 DIAGNOSIS — N179 Acute kidney failure, unspecified: Secondary | ICD-10-CM

## 2016-04-10 LAB — IRON AND TIBC
Iron: 125 ug/dL (ref 28–170)
SATURATION RATIOS: 53 % — AB (ref 10.4–31.8)
TIBC: 237 ug/dL — ABNORMAL LOW (ref 250–450)
UIBC: 112 ug/dL

## 2016-04-10 LAB — FERRITIN: FERRITIN: 1233 ng/mL — AB (ref 11–307)

## 2016-04-10 LAB — POCT HEMOGLOBIN-HEMACUE: HEMOGLOBIN: 11 g/dL — AB (ref 12.0–15.0)

## 2016-04-10 MED ORDER — EPOETIN ALFA 40000 UNIT/ML IJ SOLN
INTRAMUSCULAR | Status: AC
  Filled 2016-04-10: qty 1

## 2016-04-10 MED ORDER — EPOETIN ALFA 40000 UNIT/ML IJ SOLN
40000.0000 [IU] | INTRAMUSCULAR | Status: DC
  Administered 2016-04-10: 40000 [IU] via SUBCUTANEOUS

## 2016-04-14 ENCOUNTER — Ambulatory Visit
Admission: RE | Admit: 2016-04-14 | Discharge: 2016-04-14 | Disposition: A | Discharge status: Home / Self Care | Payer: Commercial Managed Care - PPO | Source: Ambulatory Visit | Attending: Physician Assistant | Admitting: Physician Assistant

## 2016-04-14 DIAGNOSIS — R0989 Other specified symptoms and signs involving the circulatory and respiratory systems: Secondary | ICD-10-CM

## 2016-04-14 DIAGNOSIS — I6523 Occlusion and stenosis of bilateral carotid arteries: Secondary | ICD-10-CM | POA: Diagnosis not present

## 2016-04-15 ENCOUNTER — Other Ambulatory Visit: Payer: Commercial Managed Care - PPO

## 2016-04-29 ENCOUNTER — Encounter (HOSPITAL_COMMUNITY)
Admission: RE | Admit: 2016-04-29 | Discharge: 2016-04-29 | Disposition: A | Discharge status: Home / Self Care | Payer: Commercial Managed Care - PPO | Source: Ambulatory Visit | Attending: Nephrology | Admitting: Nephrology

## 2016-04-29 DIAGNOSIS — N179 Acute kidney failure, unspecified: Secondary | ICD-10-CM

## 2016-04-29 DIAGNOSIS — D631 Anemia in chronic kidney disease: Secondary | ICD-10-CM | POA: Insufficient documentation

## 2016-04-29 DIAGNOSIS — N184 Chronic kidney disease, stage 4 (severe): Secondary | ICD-10-CM | POA: Diagnosis not present

## 2016-04-29 MED ORDER — EPOETIN ALFA 40000 UNIT/ML IJ SOLN
40000.0000 [IU] | INTRAMUSCULAR | Status: DC
  Administered 2016-04-29: 40000 [IU] via SUBCUTANEOUS

## 2016-04-29 MED ORDER — EPOETIN ALFA 40000 UNIT/ML IJ SOLN
INTRAMUSCULAR | Status: AC
  Filled 2016-04-29: qty 1

## 2016-04-30 LAB — POCT HEMOGLOBIN-HEMACUE: Hemoglobin: 11.5 g/dL — ABNORMAL LOW (ref 12.0–15.0)

## 2016-05-20 ENCOUNTER — Inpatient Hospital Stay (HOSPITAL_COMMUNITY)
Admission: RE | Admit: 2016-05-20 | Discharge: 2016-05-20 | Disposition: A | Discharge status: Home / Self Care | Payer: Commercial Managed Care - PPO | Source: Ambulatory Visit | Attending: Nephrology | Admitting: Nephrology

## 2016-06-09 ENCOUNTER — Encounter (HOSPITAL_COMMUNITY): Payer: Commercial Managed Care - PPO

## 2016-06-15 DIAGNOSIS — I129 Hypertensive chronic kidney disease with stage 1 through stage 4 chronic kidney disease, or unspecified chronic kidney disease: Secondary | ICD-10-CM | POA: Diagnosis not present

## 2016-06-15 DIAGNOSIS — D631 Anemia in chronic kidney disease: Secondary | ICD-10-CM | POA: Diagnosis not present

## 2016-06-15 DIAGNOSIS — N184 Chronic kidney disease, stage 4 (severe): Secondary | ICD-10-CM | POA: Diagnosis not present

## 2016-06-15 DIAGNOSIS — N2581 Secondary hyperparathyroidism of renal origin: Secondary | ICD-10-CM | POA: Diagnosis not present

## 2016-06-23 ENCOUNTER — Encounter (HOSPITAL_COMMUNITY)
Admission: RE | Admit: 2016-06-23 | Discharge: 2016-06-23 | Disposition: A | Discharge status: Home / Self Care | Payer: Commercial Managed Care - PPO | Source: Ambulatory Visit | Attending: Nephrology | Admitting: Nephrology

## 2016-06-23 DIAGNOSIS — D631 Anemia in chronic kidney disease: Secondary | ICD-10-CM | POA: Insufficient documentation

## 2016-06-23 DIAGNOSIS — N184 Chronic kidney disease, stage 4 (severe): Secondary | ICD-10-CM | POA: Insufficient documentation

## 2016-06-23 DIAGNOSIS — N179 Acute kidney failure, unspecified: Secondary | ICD-10-CM

## 2016-06-23 LAB — IRON AND TIBC
IRON: 125 ug/dL (ref 28–170)
Saturation Ratios: 50 % — ABNORMAL HIGH (ref 10.4–31.8)
TIBC: 252 ug/dL (ref 250–450)
UIBC: 127 ug/dL

## 2016-06-23 LAB — POCT HEMOGLOBIN-HEMACUE: Hemoglobin: 10.2 g/dL — ABNORMAL LOW (ref 12.0–15.0)

## 2016-06-23 LAB — FERRITIN: Ferritin: 1160 ng/mL — ABNORMAL HIGH (ref 11–307)

## 2016-06-23 MED ORDER — EPOETIN ALFA 40000 UNIT/ML IJ SOLN
40000.0000 [IU] | INTRAMUSCULAR | Status: DC
  Administered 2016-06-23: 40000 [IU] via SUBCUTANEOUS

## 2016-06-23 MED ORDER — EPOETIN ALFA 40000 UNIT/ML IJ SOLN
INTRAMUSCULAR | Status: AC
  Filled 2016-06-23: qty 1

## 2016-06-30 ENCOUNTER — Encounter (HOSPITAL_COMMUNITY)
Admission: RE | Admit: 2016-06-30 | Discharge: 2016-06-30 | Disposition: A | Discharge status: Home / Self Care | Payer: Commercial Managed Care - PPO | Source: Ambulatory Visit | Attending: Nephrology | Admitting: Nephrology

## 2016-06-30 DIAGNOSIS — D631 Anemia in chronic kidney disease: Secondary | ICD-10-CM | POA: Diagnosis not present

## 2016-06-30 DIAGNOSIS — N184 Chronic kidney disease, stage 4 (severe): Secondary | ICD-10-CM | POA: Insufficient documentation

## 2016-06-30 DIAGNOSIS — N179 Acute kidney failure, unspecified: Secondary | ICD-10-CM

## 2016-06-30 LAB — POCT HEMOGLOBIN-HEMACUE: Hemoglobin: 10.2 g/dL — ABNORMAL LOW (ref 12.0–15.0)

## 2016-06-30 MED ORDER — EPOETIN ALFA 40000 UNIT/ML IJ SOLN
40000.0000 [IU] | INTRAMUSCULAR | Status: DC
  Administered 2016-06-30: 40000 [IU] via SUBCUTANEOUS

## 2016-06-30 MED ORDER — EPOETIN ALFA 40000 UNIT/ML IJ SOLN
INTRAMUSCULAR | Status: AC
  Filled 2016-06-30: qty 1

## 2016-07-07 DIAGNOSIS — L03115 Cellulitis of right lower limb: Secondary | ICD-10-CM | POA: Diagnosis not present

## 2016-07-07 DIAGNOSIS — E1142 Type 2 diabetes mellitus with diabetic polyneuropathy: Secondary | ICD-10-CM | POA: Diagnosis not present

## 2016-07-07 DIAGNOSIS — L97311 Non-pressure chronic ulcer of right ankle limited to breakdown of skin: Secondary | ICD-10-CM | POA: Diagnosis not present

## 2016-07-14 ENCOUNTER — Encounter (HOSPITAL_COMMUNITY)
Admission: RE | Admit: 2016-07-14 | Discharge: 2016-07-14 | Disposition: A | Discharge status: Home / Self Care | Payer: Commercial Managed Care - PPO | Source: Ambulatory Visit | Attending: Nephrology | Admitting: Nephrology

## 2016-07-14 DIAGNOSIS — N184 Chronic kidney disease, stage 4 (severe): Secondary | ICD-10-CM | POA: Diagnosis not present

## 2016-07-14 DIAGNOSIS — N179 Acute kidney failure, unspecified: Secondary | ICD-10-CM

## 2016-07-14 DIAGNOSIS — D631 Anemia in chronic kidney disease: Secondary | ICD-10-CM | POA: Diagnosis not present

## 2016-07-14 LAB — POCT HEMOGLOBIN-HEMACUE: Hemoglobin: 13.2 g/dL (ref 12.0–15.0)

## 2016-07-14 MED ORDER — EPOETIN ALFA 40000 UNIT/ML IJ SOLN: 40000.0000 [IU] | INTRAMUSCULAR | Status: DC

## 2016-07-14 MED ORDER — EPOETIN ALFA 40000 UNIT/ML IJ SOLN
INTRAMUSCULAR | Status: AC
  Filled 2016-07-14: qty 1

## 2016-07-31 ENCOUNTER — Encounter (HOSPITAL_COMMUNITY): Payer: Commercial Managed Care - PPO

## 2016-08-11 ENCOUNTER — Encounter (HOSPITAL_COMMUNITY)
Admission: RE | Admit: 2016-08-11 | Discharge: 2016-08-11 | Disposition: A | Discharge status: Home / Self Care | Payer: Commercial Managed Care - PPO | Source: Ambulatory Visit | Attending: Nephrology | Admitting: Nephrology

## 2016-08-11 DIAGNOSIS — D631 Anemia in chronic kidney disease: Secondary | ICD-10-CM | POA: Insufficient documentation

## 2016-08-11 DIAGNOSIS — N184 Chronic kidney disease, stage 4 (severe): Secondary | ICD-10-CM | POA: Diagnosis not present

## 2016-08-11 DIAGNOSIS — N179 Acute kidney failure, unspecified: Secondary | ICD-10-CM

## 2016-08-11 LAB — IRON AND TIBC
IRON: 107 ug/dL (ref 28–170)
Saturation Ratios: 48 % — ABNORMAL HIGH (ref 10.4–31.8)
TIBC: 223 ug/dL — AB (ref 250–450)
UIBC: 116 ug/dL

## 2016-08-11 LAB — FERRITIN: Ferritin: 1173 ng/mL — ABNORMAL HIGH (ref 11–307)

## 2016-08-11 LAB — POCT HEMOGLOBIN-HEMACUE: HEMOGLOBIN: 12.2 g/dL (ref 12.0–15.0)

## 2016-08-11 MED ORDER — EPOETIN ALFA 40000 UNIT/ML IJ SOLN: 40000.0000 [IU] | INTRAMUSCULAR | Status: DC

## 2016-08-25 ENCOUNTER — Encounter (HOSPITAL_COMMUNITY): Payer: Commercial Managed Care - PPO

## 2016-08-28 ENCOUNTER — Encounter (HOSPITAL_COMMUNITY): Payer: Commercial Managed Care - PPO

## 2016-09-09 ENCOUNTER — Encounter (HOSPITAL_COMMUNITY)
Admission: RE | Admit: 2016-09-09 | Discharge: 2016-09-09 | Disposition: A | Discharge status: Home / Self Care | Payer: Commercial Managed Care - PPO | Source: Ambulatory Visit | Attending: Nephrology | Admitting: Nephrology

## 2016-09-09 DIAGNOSIS — D631 Anemia in chronic kidney disease: Secondary | ICD-10-CM | POA: Diagnosis not present

## 2016-09-09 DIAGNOSIS — N179 Acute kidney failure, unspecified: Secondary | ICD-10-CM

## 2016-09-09 DIAGNOSIS — N184 Chronic kidney disease, stage 4 (severe): Secondary | ICD-10-CM | POA: Insufficient documentation

## 2016-09-09 LAB — IRON AND TIBC
IRON: 194 ug/dL — AB (ref 28–170)
Saturation Ratios: 86 % — ABNORMAL HIGH (ref 10.4–31.8)
TIBC: 225 ug/dL — AB (ref 250–450)
UIBC: 31 ug/dL

## 2016-09-09 LAB — POCT HEMOGLOBIN-HEMACUE: Hemoglobin: 11.1 g/dL — ABNORMAL LOW (ref 12.0–15.0)

## 2016-09-09 LAB — FERRITIN: FERRITIN: 1157 ng/mL — AB (ref 11–307)

## 2016-09-09 MED ORDER — EPOETIN ALFA 40000 UNIT/ML IJ SOLN
INTRAMUSCULAR | Status: AC
  Filled 2016-09-09: qty 1

## 2016-09-09 MED ORDER — EPOETIN ALFA 40000 UNIT/ML IJ SOLN
40000.0000 [IU] | INTRAMUSCULAR | Status: DC
  Administered 2016-09-09: 40000 [IU] via SUBCUTANEOUS

## 2016-09-16 DIAGNOSIS — R1013 Epigastric pain: Secondary | ICD-10-CM | POA: Diagnosis not present

## 2016-09-16 DIAGNOSIS — M545 Low back pain: Secondary | ICD-10-CM | POA: Diagnosis not present

## 2016-09-18 ENCOUNTER — Other Ambulatory Visit: Payer: Self-pay | Admitting: Family Medicine

## 2016-09-18 DIAGNOSIS — R1013 Epigastric pain: Secondary | ICD-10-CM

## 2016-09-30 ENCOUNTER — Inpatient Hospital Stay (HOSPITAL_COMMUNITY): Admission: RE | Admit: 2016-09-30 | Payer: Commercial Managed Care - PPO | Source: Ambulatory Visit

## 2016-10-06 DIAGNOSIS — N184 Chronic kidney disease, stage 4 (severe): Secondary | ICD-10-CM | POA: Diagnosis not present

## 2016-10-06 DIAGNOSIS — D631 Anemia in chronic kidney disease: Secondary | ICD-10-CM | POA: Diagnosis not present

## 2016-10-06 DIAGNOSIS — I129 Hypertensive chronic kidney disease with stage 1 through stage 4 chronic kidney disease, or unspecified chronic kidney disease: Secondary | ICD-10-CM | POA: Diagnosis not present

## 2016-10-14 DIAGNOSIS — E1121 Type 2 diabetes mellitus with diabetic nephropathy: Secondary | ICD-10-CM | POA: Diagnosis not present

## 2016-10-14 DIAGNOSIS — D631 Anemia in chronic kidney disease: Secondary | ICD-10-CM | POA: Diagnosis not present

## 2016-10-14 DIAGNOSIS — N184 Chronic kidney disease, stage 4 (severe): Secondary | ICD-10-CM | POA: Diagnosis not present

## 2016-10-16 ENCOUNTER — Encounter (HOSPITAL_COMMUNITY)
Admission: RE | Admit: 2016-10-16 | Discharge: 2016-10-16 | Disposition: A | Discharge status: Home / Self Care | Payer: Commercial Managed Care - PPO | Source: Ambulatory Visit | Attending: Nephrology | Admitting: Nephrology

## 2016-10-16 DIAGNOSIS — N184 Chronic kidney disease, stage 4 (severe): Secondary | ICD-10-CM | POA: Diagnosis not present

## 2016-10-16 DIAGNOSIS — D631 Anemia in chronic kidney disease: Secondary | ICD-10-CM | POA: Insufficient documentation

## 2016-10-16 DIAGNOSIS — N179 Acute kidney failure, unspecified: Secondary | ICD-10-CM

## 2016-10-16 LAB — IRON AND TIBC
Iron: 113 ug/dL (ref 28–170)
SATURATION RATIOS: 51 % — AB (ref 10.4–31.8)
TIBC: 221 ug/dL — AB (ref 250–450)
UIBC: 108 ug/dL

## 2016-10-16 LAB — FERRITIN: Ferritin: 1176 ng/mL — ABNORMAL HIGH (ref 11–307)

## 2016-10-16 LAB — POCT HEMOGLOBIN-HEMACUE: HEMOGLOBIN: 10.7 g/dL — AB (ref 12.0–15.0)

## 2016-10-16 MED ORDER — EPOETIN ALFA 40000 UNIT/ML IJ SOLN
INTRAMUSCULAR | Status: AC
  Filled 2016-10-16: qty 1

## 2016-10-16 MED ORDER — EPOETIN ALFA 40000 UNIT/ML IJ SOLN
40000.0000 [IU] | INTRAMUSCULAR | Status: DC
  Administered 2016-10-16: 40000 [IU] via SUBCUTANEOUS

## 2016-10-23 DIAGNOSIS — Z760 Encounter for issue of repeat prescription: Secondary | ICD-10-CM | POA: Diagnosis not present

## 2016-11-04 ENCOUNTER — Encounter (HOSPITAL_COMMUNITY)
Admission: RE | Admit: 2016-11-04 | Discharge: 2016-11-04 | Disposition: A | Discharge status: Home / Self Care | Payer: Commercial Managed Care - PPO | Source: Ambulatory Visit | Attending: Nephrology | Admitting: Nephrology

## 2016-11-04 DIAGNOSIS — N184 Chronic kidney disease, stage 4 (severe): Secondary | ICD-10-CM | POA: Diagnosis not present

## 2016-11-04 DIAGNOSIS — D631 Anemia in chronic kidney disease: Secondary | ICD-10-CM | POA: Diagnosis not present

## 2016-11-04 DIAGNOSIS — N179 Acute kidney failure, unspecified: Secondary | ICD-10-CM

## 2016-11-04 LAB — POCT HEMOGLOBIN-HEMACUE: Hemoglobin: 12.8 g/dL (ref 12.0–15.0)

## 2016-11-04 MED ORDER — EPOETIN ALFA 40000 UNIT/ML IJ SOLN: 40000.0000 [IU] | INTRAMUSCULAR | Status: DC

## 2016-11-25 ENCOUNTER — Encounter (HOSPITAL_COMMUNITY)
Admission: RE | Admit: 2016-11-25 | Discharge: 2016-11-25 | Disposition: A | Discharge status: Home / Self Care | Payer: Commercial Managed Care - PPO | Source: Ambulatory Visit | Attending: Nephrology | Admitting: Nephrology

## 2016-11-25 DIAGNOSIS — N184 Chronic kidney disease, stage 4 (severe): Secondary | ICD-10-CM | POA: Diagnosis not present

## 2016-11-25 DIAGNOSIS — N179 Acute kidney failure, unspecified: Secondary | ICD-10-CM

## 2016-11-25 DIAGNOSIS — D631 Anemia in chronic kidney disease: Secondary | ICD-10-CM | POA: Diagnosis not present

## 2016-11-25 LAB — FERRITIN: FERRITIN: 903 ng/mL — AB (ref 11–307)

## 2016-11-25 LAB — IRON AND TIBC
Iron: 114 ug/dL (ref 28–170)
Saturation Ratios: 49 % — ABNORMAL HIGH (ref 10.4–31.8)
TIBC: 231 ug/dL — ABNORMAL LOW (ref 250–450)
UIBC: 117 ug/dL

## 2016-11-25 LAB — POCT HEMOGLOBIN-HEMACUE: Hemoglobin: 10.8 g/dL — ABNORMAL LOW (ref 12.0–15.0)

## 2016-11-25 MED ORDER — EPOETIN ALFA 40000 UNIT/ML IJ SOLN
INTRAMUSCULAR | Status: AC
  Administered 2016-11-25: 40000 [IU] via SUBCUTANEOUS
  Filled 2016-11-25: qty 1

## 2016-11-25 MED ORDER — EPOETIN ALFA 40000 UNIT/ML IJ SOLN
40000.0000 [IU] | INTRAMUSCULAR | Status: DC
  Administered 2016-11-25: 40000 [IU] via SUBCUTANEOUS

## 2016-12-16 ENCOUNTER — Encounter (HOSPITAL_COMMUNITY)
Admission: RE | Admit: 2016-12-16 | Discharge: 2016-12-16 | Disposition: A | Discharge status: Home / Self Care | Payer: Commercial Managed Care - PPO | Source: Ambulatory Visit | Attending: Nephrology | Admitting: Nephrology

## 2016-12-16 VITALS — BP 185/85 | HR 82 | Resp 18

## 2016-12-16 DIAGNOSIS — N184 Chronic kidney disease, stage 4 (severe): Secondary | ICD-10-CM | POA: Diagnosis not present

## 2016-12-16 DIAGNOSIS — D631 Anemia in chronic kidney disease: Secondary | ICD-10-CM | POA: Insufficient documentation

## 2016-12-16 DIAGNOSIS — N179 Acute kidney failure, unspecified: Secondary | ICD-10-CM

## 2016-12-16 MED ORDER — EPOETIN ALFA 40000 UNIT/ML IJ SOLN: 40000.0000 [IU] | INTRAMUSCULAR | Status: DC

## 2016-12-16 NOTE — Progress Notes (Signed)
Pt's BP beyond parameters for treatment.  Had patient sitting to see if BP would come down if she waited 15 minutes.  Pt took BP cuff off herself, and walked out before we could take care of her or recheck VS.  Pt left without another appountment.

## 2016-12-30 ENCOUNTER — Encounter (HOSPITAL_COMMUNITY)
Admission: RE | Admit: 2016-12-30 | Discharge: 2016-12-30 | Disposition: A | Discharge status: Home / Self Care | Payer: Commercial Managed Care - PPO | Source: Ambulatory Visit | Attending: Nephrology | Admitting: Nephrology

## 2016-12-30 VITALS — BP 167/83 | HR 80 | Temp 97.5°F | Resp 18

## 2016-12-30 DIAGNOSIS — D631 Anemia in chronic kidney disease: Secondary | ICD-10-CM | POA: Diagnosis not present

## 2016-12-30 DIAGNOSIS — N184 Chronic kidney disease, stage 4 (severe): Secondary | ICD-10-CM | POA: Insufficient documentation

## 2016-12-30 DIAGNOSIS — N179 Acute kidney failure, unspecified: Secondary | ICD-10-CM

## 2016-12-30 LAB — IRON AND TIBC
Iron: 102 ug/dL (ref 28–170)
SATURATION RATIOS: 42 % — AB (ref 10.4–31.8)
TIBC: 241 ug/dL — ABNORMAL LOW (ref 250–450)
UIBC: 139 ug/dL

## 2016-12-30 LAB — FERRITIN: Ferritin: 789 ng/mL — ABNORMAL HIGH (ref 11–307)

## 2016-12-30 MED ORDER — EPOETIN ALFA 40000 UNIT/ML IJ SOLN
40000.0000 [IU] | INTRAMUSCULAR | Status: DC
  Administered 2016-12-30: 40000 [IU] via SUBCUTANEOUS

## 2016-12-30 MED ORDER — EPOETIN ALFA 40000 UNIT/ML IJ SOLN
INTRAMUSCULAR | Status: AC
  Filled 2016-12-30: qty 1

## 2016-12-31 LAB — POCT HEMOGLOBIN-HEMACUE: Hemoglobin: 11.6 g/dL — ABNORMAL LOW (ref 12.0–15.0)

## 2017-01-11 DIAGNOSIS — I129 Hypertensive chronic kidney disease with stage 1 through stage 4 chronic kidney disease, or unspecified chronic kidney disease: Secondary | ICD-10-CM | POA: Diagnosis not present

## 2017-01-11 DIAGNOSIS — N184 Chronic kidney disease, stage 4 (severe): Secondary | ICD-10-CM | POA: Diagnosis not present

## 2017-01-11 DIAGNOSIS — N189 Chronic kidney disease, unspecified: Secondary | ICD-10-CM | POA: Diagnosis not present

## 2017-01-11 DIAGNOSIS — D631 Anemia in chronic kidney disease: Secondary | ICD-10-CM | POA: Diagnosis not present

## 2017-01-11 DIAGNOSIS — Z23 Encounter for immunization: Secondary | ICD-10-CM | POA: Diagnosis not present

## 2017-01-15 DIAGNOSIS — N184 Chronic kidney disease, stage 4 (severe): Secondary | ICD-10-CM | POA: Diagnosis not present

## 2017-01-18 DIAGNOSIS — I129 Hypertensive chronic kidney disease with stage 1 through stage 4 chronic kidney disease, or unspecified chronic kidney disease: Secondary | ICD-10-CM | POA: Diagnosis not present

## 2017-01-18 DIAGNOSIS — M79671 Pain in right foot: Secondary | ICD-10-CM | POA: Diagnosis not present

## 2017-01-18 DIAGNOSIS — L03115 Cellulitis of right lower limb: Secondary | ICD-10-CM | POA: Diagnosis not present

## 2017-01-18 DIAGNOSIS — E1165 Type 2 diabetes mellitus with hyperglycemia: Secondary | ICD-10-CM | POA: Diagnosis not present

## 2017-01-21 DIAGNOSIS — E11621 Type 2 diabetes mellitus with foot ulcer: Secondary | ICD-10-CM | POA: Diagnosis not present

## 2017-01-21 DIAGNOSIS — L03115 Cellulitis of right lower limb: Secondary | ICD-10-CM | POA: Diagnosis not present

## 2017-01-21 DIAGNOSIS — E1142 Type 2 diabetes mellitus with diabetic polyneuropathy: Secondary | ICD-10-CM | POA: Diagnosis not present

## 2017-01-25 ENCOUNTER — Other Ambulatory Visit (HOSPITAL_COMMUNITY): Payer: Self-pay

## 2017-01-27 ENCOUNTER — Encounter (HOSPITAL_COMMUNITY): Payer: Commercial Managed Care - PPO

## 2017-02-08 ENCOUNTER — Encounter (HOSPITAL_COMMUNITY)
Admission: RE | Admit: 2017-02-08 | Discharge: 2017-02-08 | Disposition: A | Discharge status: Home / Self Care | Payer: Commercial Managed Care - PPO | Source: Ambulatory Visit | Attending: Nephrology | Admitting: Nephrology

## 2017-02-08 VITALS — BP 154/77 | HR 84 | Temp 98.0°F | Resp 18

## 2017-02-08 DIAGNOSIS — N184 Chronic kidney disease, stage 4 (severe): Secondary | ICD-10-CM | POA: Diagnosis not present

## 2017-02-08 DIAGNOSIS — N179 Acute kidney failure, unspecified: Secondary | ICD-10-CM

## 2017-02-08 DIAGNOSIS — D631 Anemia in chronic kidney disease: Secondary | ICD-10-CM | POA: Diagnosis not present

## 2017-02-08 LAB — IRON AND TIBC
Iron: 88 ug/dL (ref 28–170)
Saturation Ratios: 37 % — ABNORMAL HIGH (ref 10.4–31.8)
TIBC: 238 ug/dL — ABNORMAL LOW (ref 250–450)
UIBC: 150 ug/dL

## 2017-02-08 LAB — FERRITIN: Ferritin: 784 ng/mL — ABNORMAL HIGH (ref 11–307)

## 2017-02-08 LAB — POCT HEMOGLOBIN-HEMACUE: Hemoglobin: 11.2 g/dL — ABNORMAL LOW (ref 12.0–15.0)

## 2017-02-08 MED ORDER — EPOETIN ALFA 40000 UNIT/ML IJ SOLN
INTRAMUSCULAR | Status: AC
  Filled 2017-02-08: qty 1

## 2017-02-08 MED ORDER — EPOETIN ALFA 40000 UNIT/ML IJ SOLN
40000.0000 [IU] | INTRAMUSCULAR | Status: DC
  Administered 2017-02-08: 40000 [IU] via SUBCUTANEOUS

## 2017-03-02 ENCOUNTER — Encounter (HOSPITAL_COMMUNITY)
Admission: RE | Admit: 2017-03-02 | Discharge: 2017-03-02 | Disposition: A | Discharge status: Home / Self Care | Payer: Commercial Managed Care - PPO | Source: Ambulatory Visit | Attending: Nephrology | Admitting: Nephrology

## 2017-03-02 VITALS — BP 161/77 | HR 82 | Resp 18

## 2017-03-02 DIAGNOSIS — N179 Acute kidney failure, unspecified: Secondary | ICD-10-CM

## 2017-03-02 DIAGNOSIS — D631 Anemia in chronic kidney disease: Secondary | ICD-10-CM | POA: Insufficient documentation

## 2017-03-02 DIAGNOSIS — N184 Chronic kidney disease, stage 4 (severe): Secondary | ICD-10-CM | POA: Insufficient documentation

## 2017-03-02 LAB — POCT HEMOGLOBIN-HEMACUE: HEMOGLOBIN: 11.6 g/dL — AB (ref 12.0–15.0)

## 2017-03-02 MED ORDER — EPOETIN ALFA 40000 UNIT/ML IJ SOLN: 40000.0000 [IU] | INTRAMUSCULAR | Status: DC

## 2017-03-02 MED ORDER — EPOETIN ALFA 40000 UNIT/ML IJ SOLN
INTRAMUSCULAR | Status: AC
  Administered 2017-03-02: 40000 [IU]
  Filled 2017-03-02: qty 1

## 2017-03-23 ENCOUNTER — Encounter (HOSPITAL_COMMUNITY)
Admission: RE | Admit: 2017-03-23 | Discharge: 2017-03-23 | Disposition: A | Discharge status: Home / Self Care | Payer: Commercial Managed Care - PPO | Source: Ambulatory Visit | Attending: Nephrology | Admitting: Nephrology

## 2017-03-23 VITALS — BP 151/82 | HR 84 | Temp 98.4°F | Resp 20

## 2017-03-23 DIAGNOSIS — N184 Chronic kidney disease, stage 4 (severe): Principal | ICD-10-CM

## 2017-03-23 DIAGNOSIS — D631 Anemia in chronic kidney disease: Secondary | ICD-10-CM | POA: Diagnosis not present

## 2017-03-23 DIAGNOSIS — N179 Acute kidney failure, unspecified: Secondary | ICD-10-CM

## 2017-03-23 LAB — IRON AND TIBC
Iron: 116 ug/dL (ref 28–170)
Saturation Ratios: 47 % — ABNORMAL HIGH (ref 10.4–31.8)
TIBC: 249 ug/dL — ABNORMAL LOW (ref 250–450)
UIBC: 133 ug/dL

## 2017-03-23 LAB — FERRITIN: Ferritin: 874 ng/mL — ABNORMAL HIGH (ref 11–307)

## 2017-03-23 LAB — POCT HEMOGLOBIN-HEMACUE: Hemoglobin: 13.2 g/dL (ref 12.0–15.0)

## 2017-03-23 MED ORDER — EPOETIN ALFA 40000 UNIT/ML IJ SOLN: 40000.0000 [IU] | INTRAMUSCULAR | Status: DC

## 2017-04-06 ENCOUNTER — Encounter (HOSPITAL_COMMUNITY): Payer: Self-pay

## 2017-04-06 ENCOUNTER — Inpatient Hospital Stay (HOSPITAL_COMMUNITY)
Admission: RE | Admit: 2017-04-06 | Discharge: 2017-04-06 | Disposition: A | Discharge status: Home / Self Care | Payer: Commercial Managed Care - PPO | Source: Ambulatory Visit | Attending: Nephrology | Admitting: Nephrology

## 2017-04-13 DIAGNOSIS — D631 Anemia in chronic kidney disease: Secondary | ICD-10-CM | POA: Diagnosis not present

## 2017-04-13 DIAGNOSIS — N184 Chronic kidney disease, stage 4 (severe): Secondary | ICD-10-CM | POA: Diagnosis not present

## 2017-04-13 DIAGNOSIS — I10 Essential (primary) hypertension: Secondary | ICD-10-CM | POA: Diagnosis not present

## 2017-04-13 DIAGNOSIS — E1121 Type 2 diabetes mellitus with diabetic nephropathy: Secondary | ICD-10-CM | POA: Diagnosis not present

## 2017-04-13 DIAGNOSIS — I951 Orthostatic hypotension: Secondary | ICD-10-CM | POA: Diagnosis not present

## 2017-04-20 ENCOUNTER — Inpatient Hospital Stay (HOSPITAL_COMMUNITY)
Admission: RE | Admit: 2017-04-20 | Discharge: 2017-04-20 | Disposition: A | Discharge status: Home / Self Care | Payer: Commercial Managed Care - PPO | Source: Ambulatory Visit | Attending: Nephrology | Admitting: Nephrology

## 2017-04-26 ENCOUNTER — Encounter (HOSPITAL_COMMUNITY)
Admission: RE | Admit: 2017-04-26 | Discharge: 2017-04-26 | Disposition: A | Discharge status: Home / Self Care | Payer: Commercial Managed Care - PPO | Source: Ambulatory Visit | Attending: Nephrology | Admitting: Nephrology

## 2017-04-26 ENCOUNTER — Encounter (HOSPITAL_COMMUNITY): Payer: Self-pay

## 2017-04-26 VITALS — BP 144/79 | HR 85 | Temp 98.7°F | Resp 18

## 2017-04-26 DIAGNOSIS — N184 Chronic kidney disease, stage 4 (severe): Secondary | ICD-10-CM | POA: Diagnosis not present

## 2017-04-26 DIAGNOSIS — D631 Anemia in chronic kidney disease: Secondary | ICD-10-CM | POA: Diagnosis not present

## 2017-04-26 DIAGNOSIS — N179 Acute kidney failure, unspecified: Secondary | ICD-10-CM

## 2017-04-26 LAB — IRON AND TIBC
Iron: 123 ug/dL (ref 28–170)
Saturation Ratios: 50 % — ABNORMAL HIGH (ref 10.4–31.8)
TIBC: 244 ug/dL — ABNORMAL LOW (ref 250–450)
UIBC: 121 ug/dL

## 2017-04-26 LAB — FERRITIN: FERRITIN: 726 ng/mL — AB (ref 11–307)

## 2017-04-26 LAB — POCT HEMOGLOBIN-HEMACUE: HEMOGLOBIN: 11.1 g/dL — AB (ref 12.0–15.0)

## 2017-04-26 MED ORDER — EPOETIN ALFA 40000 UNIT/ML IJ SOLN
INTRAMUSCULAR | Status: AC
  Filled 2017-04-26: qty 1

## 2017-04-26 MED ORDER — EPOETIN ALFA 40000 UNIT/ML IJ SOLN
40000.0000 [IU] | INTRAMUSCULAR | Status: DC
  Administered 2017-04-26: 40000 [IU] via SUBCUTANEOUS

## 2017-05-03 ENCOUNTER — Encounter (HOSPITAL_COMMUNITY): Payer: Commercial Managed Care - PPO

## 2017-05-04 ENCOUNTER — Encounter (HOSPITAL_COMMUNITY): Payer: Commercial Managed Care - PPO

## 2017-05-10 ENCOUNTER — Encounter (HOSPITAL_COMMUNITY): Payer: Commercial Managed Care - PPO

## 2017-05-19 ENCOUNTER — Encounter (HOSPITAL_COMMUNITY)
Admission: RE | Admit: 2017-05-19 | Discharge: 2017-05-19 | Disposition: A | Discharge status: Home / Self Care | Payer: Commercial Managed Care - PPO | Source: Ambulatory Visit | Attending: Nephrology | Admitting: Nephrology

## 2017-05-19 VITALS — BP 169/84 | HR 85 | Temp 99.0°F | Resp 18

## 2017-05-19 DIAGNOSIS — N179 Acute kidney failure, unspecified: Secondary | ICD-10-CM

## 2017-05-19 DIAGNOSIS — N184 Chronic kidney disease, stage 4 (severe): Principal | ICD-10-CM

## 2017-05-19 DIAGNOSIS — D631 Anemia in chronic kidney disease: Secondary | ICD-10-CM | POA: Diagnosis not present

## 2017-05-19 LAB — POCT HEMOGLOBIN-HEMACUE: HEMOGLOBIN: 11.6 g/dL — AB (ref 12.0–15.0)

## 2017-05-19 MED ORDER — EPOETIN ALFA 40000 UNIT/ML IJ SOLN
INTRAMUSCULAR | Status: AC
  Filled 2017-05-19: qty 1

## 2017-05-19 MED ORDER — EPOETIN ALFA 40000 UNIT/ML IJ SOLN
40000.0000 [IU] | INTRAMUSCULAR | Status: DC
  Administered 2017-05-19: 40000 [IU] via SUBCUTANEOUS

## 2017-05-21 DIAGNOSIS — J209 Acute bronchitis, unspecified: Secondary | ICD-10-CM | POA: Diagnosis not present

## 2017-06-02 ENCOUNTER — Encounter (HOSPITAL_COMMUNITY): Payer: Commercial Managed Care - PPO

## 2017-06-17 ENCOUNTER — Encounter (HOSPITAL_COMMUNITY)
Admission: RE | Admit: 2017-06-17 | Discharge: 2017-06-17 | Disposition: A | Discharge status: Home / Self Care | Payer: Commercial Managed Care - PPO | Source: Ambulatory Visit | Attending: Nephrology | Admitting: Nephrology

## 2017-06-17 VITALS — BP 139/68 | HR 85 | Temp 98.7°F

## 2017-06-17 DIAGNOSIS — N179 Acute kidney failure, unspecified: Secondary | ICD-10-CM

## 2017-06-17 DIAGNOSIS — D631 Anemia in chronic kidney disease: Secondary | ICD-10-CM | POA: Insufficient documentation

## 2017-06-17 DIAGNOSIS — N184 Chronic kidney disease, stage 4 (severe): Secondary | ICD-10-CM | POA: Insufficient documentation

## 2017-06-17 LAB — IRON AND TIBC
Iron: 109 ug/dL (ref 28–170)
SATURATION RATIOS: 42 % — AB (ref 10.4–31.8)
TIBC: 258 ug/dL (ref 250–450)
UIBC: 149 ug/dL

## 2017-06-17 LAB — POCT HEMOGLOBIN-HEMACUE: Hemoglobin: 14.1 g/dL (ref 12.0–15.0)

## 2017-06-17 LAB — FERRITIN: Ferritin: 843 ng/mL — ABNORMAL HIGH (ref 11–307)

## 2017-06-17 MED ORDER — EPOETIN ALFA 40000 UNIT/ML IJ SOLN: 40000.0000 [IU] | INTRAMUSCULAR | Status: DC

## 2017-07-01 ENCOUNTER — Ambulatory Visit (HOSPITAL_COMMUNITY)
Admission: RE | Admit: 2017-07-01 | Discharge: 2017-07-01 | Disposition: A | Discharge status: Home / Self Care | Payer: Commercial Managed Care - PPO | Source: Ambulatory Visit | Attending: Nephrology | Admitting: Nephrology

## 2017-07-01 VITALS — BP 138/69 | HR 83 | Temp 98.7°F | Resp 18

## 2017-07-01 DIAGNOSIS — N184 Chronic kidney disease, stage 4 (severe): Secondary | ICD-10-CM | POA: Insufficient documentation

## 2017-07-01 DIAGNOSIS — N179 Acute kidney failure, unspecified: Secondary | ICD-10-CM | POA: Diagnosis not present

## 2017-07-01 LAB — POCT HEMOGLOBIN-HEMACUE: HEMOGLOBIN: 12.4 g/dL (ref 12.0–15.0)

## 2017-07-01 MED ORDER — EPOETIN ALFA 40000 UNIT/ML IJ SOLN: 40000.0000 [IU] | INTRAMUSCULAR | Status: DC

## 2017-07-14 DIAGNOSIS — Z0000 Encounter for general adult medical examination without abnormal findings: Secondary | ICD-10-CM | POA: Diagnosis not present

## 2017-07-14 DIAGNOSIS — E7800 Pure hypercholesterolemia, unspecified: Secondary | ICD-10-CM | POA: Diagnosis not present

## 2017-07-14 DIAGNOSIS — E1121 Type 2 diabetes mellitus with diabetic nephropathy: Secondary | ICD-10-CM | POA: Diagnosis not present

## 2017-07-14 DIAGNOSIS — I10 Essential (primary) hypertension: Secondary | ICD-10-CM | POA: Diagnosis not present

## 2017-07-15 ENCOUNTER — Inpatient Hospital Stay (HOSPITAL_COMMUNITY)
Admission: RE | Admit: 2017-07-15 | Discharge: 2017-07-15 | Disposition: A | Discharge status: Home / Self Care | Payer: Commercial Managed Care - PPO | Source: Ambulatory Visit | Attending: Nephrology | Admitting: Nephrology

## 2017-07-26 DIAGNOSIS — D631 Anemia in chronic kidney disease: Secondary | ICD-10-CM | POA: Diagnosis not present

## 2017-07-26 DIAGNOSIS — Z7689 Persons encountering health services in other specified circumstances: Secondary | ICD-10-CM | POA: Diagnosis not present

## 2017-07-28 ENCOUNTER — Encounter (HOSPITAL_COMMUNITY): Payer: Commercial Managed Care - PPO

## 2017-08-03 ENCOUNTER — Ambulatory Visit (HOSPITAL_COMMUNITY)
Admission: RE | Admit: 2017-08-03 | Discharge: 2017-08-03 | Disposition: A | Discharge status: Home / Self Care | Payer: Commercial Managed Care - PPO | Source: Ambulatory Visit | Attending: Nephrology | Admitting: Nephrology

## 2017-08-03 VITALS — BP 152/78 | HR 80 | Resp 18

## 2017-08-03 DIAGNOSIS — N184 Chronic kidney disease, stage 4 (severe): Secondary | ICD-10-CM | POA: Insufficient documentation

## 2017-08-03 DIAGNOSIS — D631 Anemia in chronic kidney disease: Secondary | ICD-10-CM | POA: Insufficient documentation

## 2017-08-03 DIAGNOSIS — N179 Acute kidney failure, unspecified: Secondary | ICD-10-CM

## 2017-08-03 LAB — IRON AND TIBC
Iron: 125 ug/dL (ref 28–170)
SATURATION RATIOS: 45 % — AB (ref 10.4–31.8)
TIBC: 279 ug/dL (ref 250–450)
UIBC: 154 ug/dL

## 2017-08-03 LAB — FERRITIN: Ferritin: 1067 ng/mL — ABNORMAL HIGH (ref 11–307)

## 2017-08-03 LAB — POCT HEMOGLOBIN-HEMACUE: Hemoglobin: 10.7 g/dL — ABNORMAL LOW (ref 12.0–15.0)

## 2017-08-03 MED ORDER — EPOETIN ALFA 40000 UNIT/ML IJ SOLN
INTRAMUSCULAR | Status: AC
  Administered 2017-08-03: 40000 [IU] via SUBCUTANEOUS
  Filled 2017-08-03: qty 1

## 2017-08-03 MED ORDER — EPOETIN ALFA 40000 UNIT/ML IJ SOLN: 40000.0000 [IU] | INTRAMUSCULAR | Status: DC

## 2017-08-04 ENCOUNTER — Encounter: Payer: Self-pay | Admitting: Podiatry

## 2017-08-04 NOTE — Progress Notes (Signed)
Requested medical records by Michaell Cowing with [employer] were e-mailed to him at [email or web address].

## 2017-08-06 DIAGNOSIS — N2581 Secondary hyperparathyroidism of renal origin: Secondary | ICD-10-CM | POA: Diagnosis not present

## 2017-08-06 DIAGNOSIS — I129 Hypertensive chronic kidney disease with stage 1 through stage 4 chronic kidney disease, or unspecified chronic kidney disease: Secondary | ICD-10-CM | POA: Diagnosis not present

## 2017-08-06 DIAGNOSIS — D631 Anemia in chronic kidney disease: Secondary | ICD-10-CM | POA: Diagnosis not present

## 2017-08-06 DIAGNOSIS — N184 Chronic kidney disease, stage 4 (severe): Secondary | ICD-10-CM | POA: Diagnosis not present

## 2017-08-17 ENCOUNTER — Encounter (HOSPITAL_COMMUNITY): Payer: Commercial Managed Care - PPO

## 2017-08-24 ENCOUNTER — Encounter (HOSPITAL_COMMUNITY)
Admission: RE | Admit: 2017-08-24 | Discharge: 2017-08-24 | Disposition: A | Discharge status: Home / Self Care | Payer: Commercial Managed Care - PPO | Source: Ambulatory Visit | Attending: Nephrology | Admitting: Nephrology

## 2017-08-24 VITALS — BP 126/68 | HR 79 | Resp 18

## 2017-08-24 DIAGNOSIS — D631 Anemia in chronic kidney disease: Secondary | ICD-10-CM | POA: Insufficient documentation

## 2017-08-24 DIAGNOSIS — N184 Chronic kidney disease, stage 4 (severe): Secondary | ICD-10-CM | POA: Diagnosis not present

## 2017-08-24 DIAGNOSIS — N179 Acute kidney failure, unspecified: Secondary | ICD-10-CM

## 2017-08-24 MED ORDER — EPOETIN ALFA 40000 UNIT/ML IJ SOLN
40000.0000 [IU] | INTRAMUSCULAR | Status: DC
  Administered 2017-08-24: 40000 [IU] via SUBCUTANEOUS

## 2017-08-24 MED ORDER — EPOETIN ALFA 40000 UNIT/ML IJ SOLN
INTRAMUSCULAR | Status: AC
  Filled 2017-08-24: qty 1

## 2017-08-25 LAB — POCT HEMOGLOBIN-HEMACUE: Hemoglobin: 11.3 g/dL — ABNORMAL LOW (ref 12.0–15.0)

## 2017-09-07 ENCOUNTER — Encounter (HOSPITAL_COMMUNITY): Payer: Commercial Managed Care - PPO

## 2017-09-14 ENCOUNTER — Ambulatory Visit (HOSPITAL_COMMUNITY)
Admission: RE | Admit: 2017-09-14 | Discharge: 2017-09-14 | Disposition: A | Discharge status: Home / Self Care | Payer: Commercial Managed Care - PPO | Source: Ambulatory Visit | Attending: Nephrology | Admitting: Nephrology

## 2017-09-14 VITALS — BP 157/81 | HR 73 | Temp 97.6°F | Resp 16

## 2017-09-14 DIAGNOSIS — Z5181 Encounter for therapeutic drug level monitoring: Secondary | ICD-10-CM | POA: Insufficient documentation

## 2017-09-14 DIAGNOSIS — Z79899 Other long term (current) drug therapy: Secondary | ICD-10-CM | POA: Insufficient documentation

## 2017-09-14 DIAGNOSIS — N179 Acute kidney failure, unspecified: Secondary | ICD-10-CM

## 2017-09-14 DIAGNOSIS — N184 Chronic kidney disease, stage 4 (severe): Secondary | ICD-10-CM | POA: Insufficient documentation

## 2017-09-14 DIAGNOSIS — D631 Anemia in chronic kidney disease: Secondary | ICD-10-CM | POA: Insufficient documentation

## 2017-09-14 LAB — IRON AND TIBC
IRON: 85 ug/dL (ref 28–170)
SATURATION RATIOS: 36 % — AB (ref 10.4–31.8)
TIBC: 238 ug/dL — ABNORMAL LOW (ref 250–450)
UIBC: 153 ug/dL

## 2017-09-14 LAB — FERRITIN: FERRITIN: 697 ng/mL — AB (ref 11–307)

## 2017-09-14 LAB — POCT HEMOGLOBIN-HEMACUE: HEMOGLOBIN: 10.8 g/dL — AB (ref 12.0–15.0)

## 2017-09-14 MED ORDER — EPOETIN ALFA 40000 UNIT/ML IJ SOLN
INTRAMUSCULAR | Status: AC
  Administered 2017-09-14: 40000 [IU]
  Filled 2017-09-14: qty 1

## 2017-09-14 MED ORDER — EPOETIN ALFA 40000 UNIT/ML IJ SOLN: 40000.0000 [IU] | INTRAMUSCULAR | Status: DC

## 2017-09-21 ENCOUNTER — Encounter (HOSPITAL_COMMUNITY): Payer: Commercial Managed Care - PPO

## 2017-09-28 ENCOUNTER — Ambulatory Visit (HOSPITAL_COMMUNITY)
Admission: RE | Admit: 2017-09-28 | Discharge: 2017-09-28 | Disposition: A | Discharge status: Home / Self Care | Payer: Commercial Managed Care - PPO | Source: Ambulatory Visit | Attending: Nephrology | Admitting: Nephrology

## 2017-09-28 VITALS — BP 154/78 | HR 75 | Temp 97.7°F | Resp 20

## 2017-09-28 DIAGNOSIS — D631 Anemia in chronic kidney disease: Secondary | ICD-10-CM | POA: Diagnosis not present

## 2017-09-28 DIAGNOSIS — N184 Chronic kidney disease, stage 4 (severe): Secondary | ICD-10-CM | POA: Insufficient documentation

## 2017-09-28 DIAGNOSIS — N179 Acute kidney failure, unspecified: Secondary | ICD-10-CM

## 2017-09-28 LAB — POCT HEMOGLOBIN-HEMACUE: HEMOGLOBIN: 12.4 g/dL (ref 12.0–15.0)

## 2017-09-28 MED ORDER — EPOETIN ALFA 40000 UNIT/ML IJ SOLN: 40000.0000 [IU] | INTRAMUSCULAR | Status: DC

## 2017-10-12 ENCOUNTER — Ambulatory Visit (HOSPITAL_COMMUNITY)
Admission: RE | Admit: 2017-10-12 | Discharge: 2017-10-12 | Disposition: A | Discharge status: Home / Self Care | Payer: Commercial Managed Care - PPO | Source: Ambulatory Visit | Attending: Nephrology | Admitting: Nephrology

## 2017-10-12 VITALS — BP 145/75 | HR 88 | Resp 18

## 2017-10-12 DIAGNOSIS — N184 Chronic kidney disease, stage 4 (severe): Secondary | ICD-10-CM | POA: Insufficient documentation

## 2017-10-12 DIAGNOSIS — N179 Acute kidney failure, unspecified: Secondary | ICD-10-CM | POA: Insufficient documentation

## 2017-10-12 LAB — FERRITIN: Ferritin: 819 ng/mL — ABNORMAL HIGH (ref 11–307)

## 2017-10-12 LAB — POCT HEMOGLOBIN-HEMACUE: Hemoglobin: 12 g/dL (ref 12.0–15.0)

## 2017-10-12 LAB — IRON AND TIBC
IRON: 73 ug/dL (ref 28–170)
Saturation Ratios: 32 % — ABNORMAL HIGH (ref 10.4–31.8)
TIBC: 227 ug/dL — AB (ref 250–450)
UIBC: 154 ug/dL

## 2017-10-12 MED ORDER — EPOETIN ALFA 40000 UNIT/ML IJ SOLN: 40000.0000 [IU] | INTRAMUSCULAR | Status: DC

## 2017-10-26 ENCOUNTER — Inpatient Hospital Stay (HOSPITAL_COMMUNITY): Admission: RE | Admit: 2017-10-26 | Payer: Commercial Managed Care - PPO | Source: Ambulatory Visit

## 2017-11-08 ENCOUNTER — Ambulatory Visit (HOSPITAL_COMMUNITY)
Admission: RE | Admit: 2017-11-08 | Discharge: 2017-11-08 | Disposition: A | Discharge status: Home / Self Care | Payer: Commercial Managed Care - PPO | Source: Ambulatory Visit | Attending: Nephrology | Admitting: Nephrology

## 2017-11-08 VITALS — BP 163/86 | HR 90 | Resp 18

## 2017-11-08 DIAGNOSIS — D631 Anemia in chronic kidney disease: Secondary | ICD-10-CM | POA: Insufficient documentation

## 2017-11-08 DIAGNOSIS — N179 Acute kidney failure, unspecified: Secondary | ICD-10-CM

## 2017-11-08 DIAGNOSIS — N184 Chronic kidney disease, stage 4 (severe): Secondary | ICD-10-CM | POA: Insufficient documentation

## 2017-11-08 LAB — IRON AND TIBC
Iron: 116 ug/dL (ref 28–170)
Saturation Ratios: 51 % — ABNORMAL HIGH (ref 10.4–31.8)
TIBC: 225 ug/dL — ABNORMAL LOW (ref 250–450)
UIBC: 109 ug/dL

## 2017-11-08 LAB — FERRITIN: Ferritin: 801 ng/mL — ABNORMAL HIGH (ref 11–307)

## 2017-11-08 LAB — POCT HEMOGLOBIN-HEMACUE: Hemoglobin: 11.3 g/dL — ABNORMAL LOW (ref 12.0–15.0)

## 2017-11-08 MED ORDER — EPOETIN ALFA 40000 UNIT/ML IJ SOLN
INTRAMUSCULAR | Status: AC
  Administered 2017-11-08: 40000 [IU] via SUBCUTANEOUS
  Filled 2017-11-08: qty 1

## 2017-11-08 MED ORDER — EPOETIN ALFA 40000 UNIT/ML IJ SOLN
40000.0000 [IU] | INTRAMUSCULAR | Status: DC
  Administered 2017-11-08: 40000 [IU] via SUBCUTANEOUS

## 2017-11-09 ENCOUNTER — Encounter (HOSPITAL_COMMUNITY): Payer: Commercial Managed Care - PPO

## 2017-11-22 ENCOUNTER — Encounter (HOSPITAL_COMMUNITY): Payer: Commercial Managed Care - PPO

## 2017-12-06 ENCOUNTER — Encounter (HOSPITAL_COMMUNITY): Payer: Commercial Managed Care - PPO

## 2017-12-08 ENCOUNTER — Encounter (HOSPITAL_COMMUNITY): Payer: Commercial Managed Care - PPO

## 2017-12-17 ENCOUNTER — Ambulatory Visit (HOSPITAL_COMMUNITY)
Admission: RE | Admit: 2017-12-17 | Discharge: 2017-12-17 | Disposition: A | Discharge status: Home / Self Care | Payer: Commercial Managed Care - PPO | Source: Ambulatory Visit | Attending: Nephrology | Admitting: Nephrology

## 2017-12-17 VITALS — BP 140/68 | HR 85 | Temp 97.9°F | Resp 20

## 2017-12-17 DIAGNOSIS — N179 Acute kidney failure, unspecified: Secondary | ICD-10-CM

## 2017-12-17 DIAGNOSIS — N184 Chronic kidney disease, stage 4 (severe): Secondary | ICD-10-CM | POA: Insufficient documentation

## 2017-12-17 LAB — IRON AND TIBC
IRON: 121 ug/dL (ref 28–170)
SATURATION RATIOS: 48 % — AB (ref 10.4–31.8)
TIBC: 252 ug/dL (ref 250–450)
UIBC: 131 ug/dL

## 2017-12-17 LAB — FERRITIN: Ferritin: 924 ng/mL — ABNORMAL HIGH (ref 11–307)

## 2017-12-17 LAB — POCT HEMOGLOBIN-HEMACUE: Hemoglobin: 11.5 g/dL — ABNORMAL LOW (ref 12.0–15.0)

## 2017-12-17 MED ORDER — EPOETIN ALFA 40000 UNIT/ML IJ SOLN
INTRAMUSCULAR | Status: AC
  Filled 2017-12-17: qty 1

## 2017-12-17 MED ORDER — EPOETIN ALFA 40000 UNIT/ML IJ SOLN
40000.0000 [IU] | INTRAMUSCULAR | Status: DC
  Administered 2017-12-17: 40000 [IU] via SUBCUTANEOUS

## 2017-12-20 ENCOUNTER — Encounter (HOSPITAL_COMMUNITY): Payer: Commercial Managed Care - PPO

## 2017-12-22 ENCOUNTER — Encounter (HOSPITAL_COMMUNITY): Payer: Commercial Managed Care - PPO

## 2017-12-31 ENCOUNTER — Inpatient Hospital Stay (HOSPITAL_COMMUNITY): Admission: RE | Admit: 2017-12-31 | Payer: Commercial Managed Care - PPO | Source: Ambulatory Visit

## 2018-01-13 ENCOUNTER — Ambulatory Visit (HOSPITAL_COMMUNITY)
Admission: RE | Admit: 2018-01-13 | Discharge: 2018-01-13 | Disposition: A | Discharge status: Home / Self Care | Payer: Commercial Managed Care - PPO | Source: Ambulatory Visit | Attending: Nephrology | Admitting: Nephrology

## 2018-01-13 VITALS — BP 150/77 | HR 81 | Temp 97.8°F | Resp 20

## 2018-01-13 DIAGNOSIS — N179 Acute kidney failure, unspecified: Secondary | ICD-10-CM | POA: Insufficient documentation

## 2018-01-13 DIAGNOSIS — N184 Chronic kidney disease, stage 4 (severe): Secondary | ICD-10-CM | POA: Insufficient documentation

## 2018-01-13 LAB — POCT HEMOGLOBIN-HEMACUE: HEMOGLOBIN: 11.9 g/dL — AB (ref 12.0–15.0)

## 2018-01-13 LAB — IRON AND TIBC
Iron: 122 ug/dL (ref 28–170)
Saturation Ratios: 48 % — ABNORMAL HIGH (ref 10.4–31.8)
TIBC: 253 ug/dL (ref 250–450)
UIBC: 131 ug/dL

## 2018-01-13 LAB — FERRITIN: FERRITIN: 971 ng/mL — AB (ref 11–307)

## 2018-01-13 MED ORDER — EPOETIN ALFA 40000 UNIT/ML IJ SOLN
40000.0000 [IU] | INTRAMUSCULAR | Status: DC
  Administered 2018-01-13: 40000 [IU] via SUBCUTANEOUS

## 2018-01-13 MED ORDER — EPOETIN ALFA 40000 UNIT/ML IJ SOLN
INTRAMUSCULAR | Status: AC
  Filled 2018-01-13: qty 1

## 2018-01-14 ENCOUNTER — Encounter (HOSPITAL_COMMUNITY): Payer: Commercial Managed Care - PPO

## 2018-01-27 ENCOUNTER — Encounter (HOSPITAL_COMMUNITY): Payer: Commercial Managed Care - PPO

## 2018-02-03 ENCOUNTER — Encounter (HOSPITAL_COMMUNITY): Payer: Commercial Managed Care - PPO

## 2018-02-14 ENCOUNTER — Encounter (HOSPITAL_COMMUNITY)
Admission: RE | Admit: 2018-02-14 | Discharge: 2018-02-14 | Disposition: A | Discharge status: Home / Self Care | Payer: Commercial Managed Care - PPO | Source: Ambulatory Visit | Attending: Nephrology | Admitting: Nephrology

## 2018-02-14 VITALS — BP 154/72 | HR 81

## 2018-02-14 DIAGNOSIS — D631 Anemia in chronic kidney disease: Secondary | ICD-10-CM | POA: Insufficient documentation

## 2018-02-14 DIAGNOSIS — N184 Chronic kidney disease, stage 4 (severe): Secondary | ICD-10-CM | POA: Insufficient documentation

## 2018-02-14 DIAGNOSIS — N179 Acute kidney failure, unspecified: Secondary | ICD-10-CM

## 2018-02-14 LAB — FERRITIN: FERRITIN: 1010 ng/mL — AB (ref 11–307)

## 2018-02-14 LAB — POCT HEMOGLOBIN-HEMACUE: HEMOGLOBIN: 11.8 g/dL — AB (ref 12.0–15.0)

## 2018-02-14 LAB — IRON AND TIBC
Iron: 133 ug/dL (ref 28–170)
Saturation Ratios: 56 % — ABNORMAL HIGH (ref 10.4–31.8)
TIBC: 237 ug/dL — ABNORMAL LOW (ref 250–450)
UIBC: 104 ug/dL

## 2018-02-14 MED ORDER — EPOETIN ALFA 40000 UNIT/ML IJ SOLN
40000.0000 [IU] | INTRAMUSCULAR | Status: DC
  Administered 2018-02-14: 40000 [IU] via SUBCUTANEOUS

## 2018-02-14 MED ORDER — EPOETIN ALFA 40000 UNIT/ML IJ SOLN
INTRAMUSCULAR | Status: AC
  Filled 2018-02-14: qty 1

## 2018-02-17 ENCOUNTER — Encounter (HOSPITAL_COMMUNITY): Payer: Commercial Managed Care - PPO

## 2018-02-23 DIAGNOSIS — N3090 Cystitis, unspecified without hematuria: Secondary | ICD-10-CM | POA: Diagnosis not present

## 2018-02-23 DIAGNOSIS — R8290 Unspecified abnormal findings in urine: Secondary | ICD-10-CM | POA: Diagnosis not present

## 2018-02-23 DIAGNOSIS — J069 Acute upper respiratory infection, unspecified: Secondary | ICD-10-CM | POA: Diagnosis not present

## 2018-02-25 DIAGNOSIS — J029 Acute pharyngitis, unspecified: Secondary | ICD-10-CM | POA: Diagnosis not present

## 2018-02-28 ENCOUNTER — Encounter (HOSPITAL_COMMUNITY): Payer: Commercial Managed Care - PPO

## 2018-03-01 DIAGNOSIS — N184 Chronic kidney disease, stage 4 (severe): Secondary | ICD-10-CM | POA: Diagnosis not present

## 2018-03-01 DIAGNOSIS — D631 Anemia in chronic kidney disease: Secondary | ICD-10-CM | POA: Diagnosis not present

## 2018-03-01 DIAGNOSIS — I129 Hypertensive chronic kidney disease with stage 1 through stage 4 chronic kidney disease, or unspecified chronic kidney disease: Secondary | ICD-10-CM | POA: Diagnosis not present

## 2018-03-02 DIAGNOSIS — N2581 Secondary hyperparathyroidism of renal origin: Secondary | ICD-10-CM | POA: Diagnosis not present

## 2018-03-02 DIAGNOSIS — N184 Chronic kidney disease, stage 4 (severe): Secondary | ICD-10-CM | POA: Diagnosis not present

## 2018-03-02 DIAGNOSIS — E118 Type 2 diabetes mellitus with unspecified complications: Secondary | ICD-10-CM | POA: Diagnosis not present

## 2018-03-14 ENCOUNTER — Encounter (HOSPITAL_COMMUNITY): Payer: Commercial Managed Care - PPO

## 2018-03-17 ENCOUNTER — Encounter (HOSPITAL_COMMUNITY): Payer: Commercial Managed Care - PPO

## 2018-03-25 ENCOUNTER — Encounter (HOSPITAL_COMMUNITY): Payer: Commercial Managed Care - PPO

## 2018-03-31 ENCOUNTER — Encounter (HOSPITAL_COMMUNITY): Payer: Commercial Managed Care - PPO

## 2018-03-31 ENCOUNTER — Ambulatory Visit (HOSPITAL_COMMUNITY)
Admission: RE | Admit: 2018-03-31 | Discharge: 2018-03-31 | Disposition: A | Discharge status: Home / Self Care | Payer: Commercial Managed Care - PPO | Source: Ambulatory Visit | Attending: Nephrology | Admitting: Nephrology

## 2018-03-31 VITALS — BP 178/88 | HR 85 | Temp 98.3°F | Resp 87

## 2018-03-31 DIAGNOSIS — N179 Acute kidney failure, unspecified: Secondary | ICD-10-CM | POA: Diagnosis not present

## 2018-03-31 DIAGNOSIS — N184 Chronic kidney disease, stage 4 (severe): Secondary | ICD-10-CM | POA: Diagnosis not present

## 2018-03-31 LAB — IRON AND TIBC
Iron: 89 ug/dL (ref 28–170)
Saturation Ratios: 34 % — ABNORMAL HIGH (ref 10.4–31.8)
TIBC: 259 ug/dL (ref 250–450)
UIBC: 170 ug/dL

## 2018-03-31 LAB — FERRITIN: Ferritin: 962 ng/mL — ABNORMAL HIGH (ref 11–307)

## 2018-03-31 LAB — POCT HEMOGLOBIN-HEMACUE: Hemoglobin: 10.7 g/dL — ABNORMAL LOW (ref 12.0–15.0)

## 2018-03-31 MED ORDER — EPOETIN ALFA 40000 UNIT/ML IJ SOLN
INTRAMUSCULAR | Status: AC
  Administered 2018-03-31: 40000 [IU]
  Filled 2018-03-31: qty 1

## 2018-03-31 MED ORDER — EPOETIN ALFA 40000 UNIT/ML IJ SOLN: 40000.0000 [IU] | INTRAMUSCULAR | Status: DC

## 2018-04-08 ENCOUNTER — Encounter (HOSPITAL_COMMUNITY): Payer: Commercial Managed Care - PPO

## 2018-04-14 ENCOUNTER — Inpatient Hospital Stay (HOSPITAL_COMMUNITY): Admission: RE | Admit: 2018-04-14 | Payer: Commercial Managed Care - PPO | Source: Ambulatory Visit

## 2018-04-28 ENCOUNTER — Encounter (HOSPITAL_COMMUNITY): Payer: Commercial Managed Care - PPO

## 2018-05-10 ENCOUNTER — Inpatient Hospital Stay (HOSPITAL_COMMUNITY): Admission: RE | Admit: 2018-05-10 | Payer: Commercial Managed Care - PPO | Source: Ambulatory Visit

## 2018-05-12 ENCOUNTER — Encounter (HOSPITAL_COMMUNITY): Payer: Commercial Managed Care - PPO

## 2018-05-18 ENCOUNTER — Other Ambulatory Visit: Payer: Self-pay

## 2018-05-18 ENCOUNTER — Ambulatory Visit (HOSPITAL_COMMUNITY)
Admission: RE | Admit: 2018-05-18 | Discharge: 2018-05-18 | Disposition: A | Discharge status: Home / Self Care | Payer: Commercial Managed Care - PPO | Source: Ambulatory Visit | Attending: Nephrology | Admitting: Nephrology

## 2018-05-18 VITALS — BP 159/82 | HR 85 | Temp 97.9°F | Resp 18

## 2018-05-18 DIAGNOSIS — N184 Chronic kidney disease, stage 4 (severe): Secondary | ICD-10-CM | POA: Insufficient documentation

## 2018-05-18 DIAGNOSIS — N179 Acute kidney failure, unspecified: Secondary | ICD-10-CM

## 2018-05-18 LAB — FERRITIN: Ferritin: 1232 ng/mL — ABNORMAL HIGH (ref 11–307)

## 2018-05-18 LAB — IRON AND TIBC
Iron: 99 ug/dL (ref 28–170)
Saturation Ratios: 38 % — ABNORMAL HIGH (ref 10.4–31.8)
TIBC: 260 ug/dL (ref 250–450)
UIBC: 161 ug/dL

## 2018-05-18 LAB — POCT HEMOGLOBIN-HEMACUE: Hemoglobin: 10.6 g/dL — ABNORMAL LOW (ref 12.0–15.0)

## 2018-05-18 MED ORDER — EPOETIN ALFA 40000 UNIT/ML IJ SOLN
40000.0000 [IU] | INTRAMUSCULAR | Status: DC
  Administered 2018-05-18: 40000 [IU] via SUBCUTANEOUS

## 2018-05-18 MED ORDER — EPOETIN ALFA 40000 UNIT/ML IJ SOLN
INTRAMUSCULAR | Status: AC
  Filled 2018-05-18: qty 1

## 2018-05-24 ENCOUNTER — Encounter (HOSPITAL_COMMUNITY): Payer: Commercial Managed Care - PPO

## 2018-06-01 ENCOUNTER — Inpatient Hospital Stay (HOSPITAL_COMMUNITY): Admission: RE | Admit: 2018-06-01 | Payer: Commercial Managed Care - PPO | Source: Ambulatory Visit

## 2018-06-15 ENCOUNTER — Encounter (HOSPITAL_COMMUNITY): Payer: Commercial Managed Care - PPO

## 2018-07-07 ENCOUNTER — Other Ambulatory Visit: Payer: Self-pay

## 2018-07-07 ENCOUNTER — Ambulatory Visit (HOSPITAL_COMMUNITY)
Admission: RE | Admit: 2018-07-07 | Discharge: 2018-07-07 | Disposition: A | Discharge status: Home / Self Care | Payer: Commercial Managed Care - PPO | Source: Ambulatory Visit | Attending: Nephrology | Admitting: Nephrology

## 2018-07-07 ENCOUNTER — Encounter (HOSPITAL_COMMUNITY): Payer: Self-pay

## 2018-07-07 VITALS — BP 129/66 | HR 81 | Temp 97.6°F | Resp 18

## 2018-07-07 DIAGNOSIS — N184 Chronic kidney disease, stage 4 (severe): Secondary | ICD-10-CM

## 2018-07-07 DIAGNOSIS — N179 Acute kidney failure, unspecified: Secondary | ICD-10-CM | POA: Diagnosis not present

## 2018-07-07 LAB — FERRITIN: Ferritin: 1025 ng/mL — ABNORMAL HIGH (ref 11–307)

## 2018-07-07 LAB — IRON AND TIBC
Iron: 87 ug/dL (ref 28–170)
Saturation Ratios: 34 % — ABNORMAL HIGH (ref 10.4–31.8)
TIBC: 253 ug/dL (ref 250–450)
UIBC: 166 ug/dL

## 2018-07-07 LAB — POCT HEMOGLOBIN-HEMACUE: Hemoglobin: 10.8 g/dL — ABNORMAL LOW (ref 12.0–15.0)

## 2018-07-07 MED ORDER — EPOETIN ALFA 40000 UNIT/ML IJ SOLN
INTRAMUSCULAR | Status: AC
  Administered 2018-07-07: 40000 [IU] via SUBCUTANEOUS
  Filled 2018-07-07: qty 1

## 2018-07-07 MED ORDER — EPOETIN ALFA 40000 UNIT/ML IJ SOLN
40000.0000 [IU] | INTRAMUSCULAR | Status: DC
  Administered 2018-07-07: 40000 [IU] via SUBCUTANEOUS

## 2018-07-21 ENCOUNTER — Encounter (HOSPITAL_COMMUNITY): Payer: Commercial Managed Care - PPO

## 2018-08-04 ENCOUNTER — Encounter (HOSPITAL_COMMUNITY): Payer: Commercial Managed Care - PPO

## 2018-08-08 ENCOUNTER — Encounter (HOSPITAL_COMMUNITY): Payer: Commercial Managed Care - PPO

## 2018-08-19 ENCOUNTER — Other Ambulatory Visit: Payer: Self-pay

## 2018-08-19 ENCOUNTER — Ambulatory Visit (HOSPITAL_COMMUNITY)
Admission: RE | Admit: 2018-08-19 | Discharge: 2018-08-19 | Disposition: A | Discharge status: Home / Self Care | Payer: Commercial Managed Care - PPO | Source: Ambulatory Visit | Attending: Nephrology | Admitting: Nephrology

## 2018-08-19 VITALS — BP 123/68 | HR 79 | Temp 97.2°F | Resp 18

## 2018-08-19 DIAGNOSIS — N184 Chronic kidney disease, stage 4 (severe): Secondary | ICD-10-CM | POA: Diagnosis present

## 2018-08-19 DIAGNOSIS — N179 Acute kidney failure, unspecified: Secondary | ICD-10-CM | POA: Insufficient documentation

## 2018-08-19 LAB — IRON AND TIBC
Iron: 91 ug/dL (ref 28–170)
Saturation Ratios: 36 % — ABNORMAL HIGH (ref 10.4–31.8)
TIBC: 256 ug/dL (ref 250–450)
UIBC: 165 ug/dL

## 2018-08-19 LAB — POCT HEMOGLOBIN-HEMACUE: Hemoglobin: 11.5 g/dL — ABNORMAL LOW (ref 12.0–15.0)

## 2018-08-19 LAB — FERRITIN: Ferritin: 935 ng/mL — ABNORMAL HIGH (ref 11–307)

## 2018-08-19 MED ORDER — EPOETIN ALFA 40000 UNIT/ML IJ SOLN
INTRAMUSCULAR | Status: AC
  Filled 2018-08-19: qty 1

## 2018-08-19 MED ORDER — EPOETIN ALFA 40000 UNIT/ML IJ SOLN
40000.0000 [IU] | INTRAMUSCULAR | Status: DC
  Administered 2018-08-19: 40000 [IU] via SUBCUTANEOUS

## 2018-08-22 ENCOUNTER — Encounter (HOSPITAL_COMMUNITY): Payer: Commercial Managed Care - PPO

## 2018-09-06 ENCOUNTER — Ambulatory Visit (HOSPITAL_COMMUNITY)
Admission: RE | Admit: 2018-09-06 | Discharge: 2018-09-06 | Disposition: A | Discharge status: Home / Self Care | Payer: Commercial Managed Care - PPO | Source: Ambulatory Visit | Attending: Nephrology | Admitting: Nephrology

## 2018-09-06 ENCOUNTER — Other Ambulatory Visit: Payer: Self-pay

## 2018-09-06 VITALS — BP 126/67 | HR 83 | Temp 97.0°F | Resp 18

## 2018-09-06 DIAGNOSIS — N179 Acute kidney failure, unspecified: Secondary | ICD-10-CM | POA: Diagnosis present

## 2018-09-06 DIAGNOSIS — N184 Chronic kidney disease, stage 4 (severe): Secondary | ICD-10-CM | POA: Insufficient documentation

## 2018-09-06 LAB — POCT HEMOGLOBIN-HEMACUE: Hemoglobin: 13.1 g/dL (ref 12.0–15.0)

## 2018-09-06 MED ORDER — EPOETIN ALFA 40000 UNIT/ML IJ SOLN
INTRAMUSCULAR | Status: AC
  Filled 2018-09-06: qty 1

## 2018-09-06 MED ORDER — EPOETIN ALFA 40000 UNIT/ML IJ SOLN: 40000.0000 [IU] | INTRAMUSCULAR | Status: DC

## 2018-09-20 ENCOUNTER — Encounter (HOSPITAL_COMMUNITY): Payer: Commercial Managed Care - PPO

## 2018-09-26 ENCOUNTER — Encounter (HOSPITAL_COMMUNITY): Payer: Commercial Managed Care - PPO

## 2018-10-07 ENCOUNTER — Ambulatory Visit (HOSPITAL_COMMUNITY)
Admission: RE | Admit: 2018-10-07 | Discharge: 2018-10-07 | Disposition: A | Discharge status: Home / Self Care | Payer: Commercial Managed Care - PPO | Source: Ambulatory Visit | Attending: Nephrology | Admitting: Nephrology

## 2018-10-07 ENCOUNTER — Other Ambulatory Visit: Payer: Self-pay

## 2018-10-07 VITALS — BP 157/88 | HR 85 | Temp 98.1°F | Resp 18

## 2018-10-07 DIAGNOSIS — D631 Anemia in chronic kidney disease: Secondary | ICD-10-CM | POA: Diagnosis not present

## 2018-10-07 DIAGNOSIS — N184 Chronic kidney disease, stage 4 (severe): Secondary | ICD-10-CM | POA: Diagnosis present

## 2018-10-07 DIAGNOSIS — N179 Acute kidney failure, unspecified: Secondary | ICD-10-CM

## 2018-10-07 LAB — POCT HEMOGLOBIN-HEMACUE: Hemoglobin: 10.9 g/dL — ABNORMAL LOW (ref 12.0–15.0)

## 2018-10-07 LAB — IRON AND TIBC
Iron: 88 ug/dL (ref 28–170)
Saturation Ratios: 38 % — ABNORMAL HIGH (ref 10.4–31.8)
TIBC: 230 ug/dL — ABNORMAL LOW (ref 250–450)
UIBC: 142 ug/dL

## 2018-10-07 LAB — FERRITIN: Ferritin: 947 ng/mL — ABNORMAL HIGH (ref 11–307)

## 2018-10-07 MED ORDER — EPOETIN ALFA 40000 UNIT/ML IJ SOLN
INTRAMUSCULAR | Status: AC
  Filled 2018-10-07: qty 1

## 2018-10-07 MED ORDER — EPOETIN ALFA 40000 UNIT/ML IJ SOLN
40000.0000 [IU] | INTRAMUSCULAR | Status: DC
  Administered 2018-10-07: 40000 [IU] via SUBCUTANEOUS

## 2018-10-10 ENCOUNTER — Encounter (HOSPITAL_COMMUNITY): Payer: Commercial Managed Care - PPO

## 2018-10-19 ENCOUNTER — Encounter (HOSPITAL_COMMUNITY): Payer: Commercial Managed Care - PPO | Attending: Nephrology

## 2018-10-27 DIAGNOSIS — R509 Fever, unspecified: Secondary | ICD-10-CM

## 2018-10-27 HISTORY — DX: Fever, unspecified: R50.9

## 2018-11-02 ENCOUNTER — Encounter (HOSPITAL_COMMUNITY): Payer: Commercial Managed Care - PPO

## 2018-11-05 ENCOUNTER — Inpatient Hospital Stay (HOSPITAL_COMMUNITY)
Admission: EM | Admit: 2018-11-05 | Discharge: 2018-11-08 | DRG: 392 | Disposition: A | Discharge status: Home / Self Care | Payer: Commercial Managed Care - PPO | Attending: Family Medicine | Admitting: Family Medicine

## 2018-11-05 ENCOUNTER — Other Ambulatory Visit: Payer: Self-pay

## 2018-11-05 ENCOUNTER — Emergency Department (HOSPITAL_COMMUNITY): Payer: Commercial Managed Care - PPO

## 2018-11-05 ENCOUNTER — Encounter (HOSPITAL_COMMUNITY): Payer: Self-pay | Admitting: Emergency Medicine

## 2018-11-05 DIAGNOSIS — Z792 Long term (current) use of antibiotics: Secondary | ICD-10-CM

## 2018-11-05 DIAGNOSIS — Z800 Family history of malignant neoplasm of digestive organs: Secondary | ICD-10-CM

## 2018-11-05 DIAGNOSIS — D631 Anemia in chronic kidney disease: Secondary | ICD-10-CM | POA: Diagnosis present

## 2018-11-05 DIAGNOSIS — Z89432 Acquired absence of left foot: Secondary | ICD-10-CM | POA: Diagnosis present

## 2018-11-05 DIAGNOSIS — I152 Hypertension secondary to endocrine disorders: Secondary | ICD-10-CM | POA: Diagnosis present

## 2018-11-05 DIAGNOSIS — L02426 Furuncle of left lower limb: Secondary | ICD-10-CM | POA: Diagnosis present

## 2018-11-05 DIAGNOSIS — K529 Noninfective gastroenteritis and colitis, unspecified: Secondary | ICD-10-CM | POA: Diagnosis not present

## 2018-11-05 DIAGNOSIS — Z20828 Contact with and (suspected) exposure to other viral communicable diseases: Secondary | ICD-10-CM | POA: Diagnosis present

## 2018-11-05 DIAGNOSIS — I5032 Chronic diastolic (congestive) heart failure: Secondary | ICD-10-CM | POA: Diagnosis present

## 2018-11-05 DIAGNOSIS — R21 Rash and other nonspecific skin eruption: Secondary | ICD-10-CM | POA: Diagnosis present

## 2018-11-05 DIAGNOSIS — E1143 Type 2 diabetes mellitus with diabetic autonomic (poly)neuropathy: Secondary | ICD-10-CM | POA: Diagnosis present

## 2018-11-05 DIAGNOSIS — N189 Chronic kidney disease, unspecified: Secondary | ICD-10-CM | POA: Diagnosis present

## 2018-11-05 DIAGNOSIS — E1165 Type 2 diabetes mellitus with hyperglycemia: Secondary | ICD-10-CM | POA: Diagnosis present

## 2018-11-05 DIAGNOSIS — N390 Urinary tract infection, site not specified: Secondary | ICD-10-CM | POA: Diagnosis present

## 2018-11-05 DIAGNOSIS — Z794 Long term (current) use of insulin: Secondary | ICD-10-CM

## 2018-11-05 DIAGNOSIS — E1159 Type 2 diabetes mellitus with other circulatory complications: Secondary | ICD-10-CM | POA: Diagnosis present

## 2018-11-05 DIAGNOSIS — M79672 Pain in left foot: Secondary | ICD-10-CM | POA: Diagnosis present

## 2018-11-05 DIAGNOSIS — F411 Generalized anxiety disorder: Secondary | ICD-10-CM | POA: Diagnosis present

## 2018-11-05 DIAGNOSIS — I10 Essential (primary) hypertension: Secondary | ICD-10-CM | POA: Diagnosis present

## 2018-11-05 DIAGNOSIS — F329 Major depressive disorder, single episode, unspecified: Secondary | ICD-10-CM | POA: Diagnosis present

## 2018-11-05 DIAGNOSIS — N185 Chronic kidney disease, stage 5: Secondary | ICD-10-CM | POA: Diagnosis present

## 2018-11-05 DIAGNOSIS — E11319 Type 2 diabetes mellitus with unspecified diabetic retinopathy without macular edema: Secondary | ICD-10-CM | POA: Diagnosis present

## 2018-11-05 DIAGNOSIS — G629 Polyneuropathy, unspecified: Secondary | ICD-10-CM

## 2018-11-05 DIAGNOSIS — I132 Hypertensive heart and chronic kidney disease with heart failure and with stage 5 chronic kidney disease, or end stage renal disease: Secondary | ICD-10-CM | POA: Diagnosis present

## 2018-11-05 DIAGNOSIS — Z87442 Personal history of urinary calculi: Secondary | ICD-10-CM

## 2018-11-05 DIAGNOSIS — Z79899 Other long term (current) drug therapy: Secondary | ICD-10-CM

## 2018-11-05 DIAGNOSIS — Z8701 Personal history of pneumonia (recurrent): Secondary | ICD-10-CM

## 2018-11-05 DIAGNOSIS — Z8249 Family history of ischemic heart disease and other diseases of the circulatory system: Secondary | ICD-10-CM

## 2018-11-05 DIAGNOSIS — Z882 Allergy status to sulfonamides status: Secondary | ICD-10-CM

## 2018-11-05 DIAGNOSIS — R509 Fever, unspecified: Secondary | ICD-10-CM | POA: Diagnosis not present

## 2018-11-05 DIAGNOSIS — E11649 Type 2 diabetes mellitus with hypoglycemia without coma: Secondary | ICD-10-CM | POA: Diagnosis not present

## 2018-11-05 DIAGNOSIS — M86172 Other acute osteomyelitis, left ankle and foot: Secondary | ICD-10-CM

## 2018-11-05 DIAGNOSIS — D72810 Lymphocytopenia: Secondary | ICD-10-CM | POA: Diagnosis not present

## 2018-11-05 DIAGNOSIS — D72819 Decreased white blood cell count, unspecified: Secondary | ICD-10-CM | POA: Diagnosis present

## 2018-11-05 DIAGNOSIS — E1122 Type 2 diabetes mellitus with diabetic chronic kidney disease: Secondary | ICD-10-CM | POA: Diagnosis present

## 2018-11-05 DIAGNOSIS — IMO0002 Reserved for concepts with insufficient information to code with codable children: Secondary | ICD-10-CM | POA: Diagnosis present

## 2018-11-05 DIAGNOSIS — D696 Thrombocytopenia, unspecified: Secondary | ICD-10-CM | POA: Diagnosis present

## 2018-11-05 DIAGNOSIS — I872 Venous insufficiency (chronic) (peripheral): Secondary | ICD-10-CM | POA: Diagnosis present

## 2018-11-05 MED ORDER — ACETAMINOPHEN 325 MG PO TABS
650.0000 mg | ORAL_TABLET | Freq: Once | ORAL | Status: AC
  Administered 2018-11-05: 650 mg via ORAL
  Filled 2018-11-05: qty 2

## 2018-11-05 MED ORDER — SODIUM CHLORIDE 0.9 % IV BOLUS
1000.0000 mL | Freq: Once | INTRAVENOUS | Status: AC
  Administered 2018-11-05: 1000 mL via INTRAVENOUS

## 2018-11-05 NOTE — ED Notes (Signed)
IV team at bedside 

## 2018-11-05 NOTE — ED Triage Notes (Signed)
Pt c/o of fever 101.5. took acetaminophen x2. + nausea denies vomiting. Also c/o  "Boils" on left leg

## 2018-11-05 NOTE — ED Provider Notes (Signed)
[facility] EMERGENCY DEPARTMENT Provider Note   CSN: [id number] Arrival date & time: 11/05/18  1937     History   Chief Complaint Chief Complaint  Patient presents with  . Fever    HPI Holly [name] is a 53 y.o. female who presents with fever.  Past medical history significant for insulin-dependent diabetes, chronic kidney disease stage IV.  Patient states that for the past 2 weeks she has had bumps that have developed over the inside of her left ankle.  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  Last night she developed a fever she reports T-max of 101.5.  This morning she continued to not feel well therefore she decided to come to the ED.  She states that she also had several episodes of diarrhea yesterday but this is resolved.  She denies headache, vision changes, sore throat, chest pain, shortness of breath, cough, abdominal pain, vomiting, urinary symptoms.  She also reports a shooting pain in the bilateral lower extremities from the foot to the mid shin.  She states that normally she cannot feel her lower extremities due to her neuropathy.   HPI  Past Medical History:  Diagnosis Date  . Acute cystitis   . Anxiety state, unspecified   . Background diabetic retinopathy(362.01)   . Bulimia   . Calculus of kidney   . Cellulitis and abscess of foot 01/24/2015   left foot  . 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 Chronic kidney disease, stage IV (severe) ([place])   . Complication of anesthesia    slow to wake up, had lost a lot of blood - 2010  . Diarrhea    symptomatic  . Disorders of magnesium metabolism   . DM2 (diabetes mellitus, type 2) ([place])   . Dysthymic disorder   . Dysuria   . Edema    moderate  . Encounter for long-term (current) use of other medications   . Esophageal reflux   . Essential hypertension, benign   . Heart murmur    been told very slight, never given her any problems  . History of blood  transfusion X 6-7; 2010 - present (03/29/2014)   "related to OR; kidney issues"  . HLD (hyperlipidemia)    HDL goal >50, LDL goal <100  . Iron deficiency anemia    "suppose to get procrit injections q 3 wks; usually don't" do it" (03/29/2014)  . Irritable bowel syndrome   . Left heart failure ([place])   . Muscle weakness (generalized)   . Neuralgia, neuritis, and radiculitis, unspecified   . Palpitations   . Peripheral autonomic neuropathy in disorders classified elsewhere(337.1)   . Pneumonia 2010  . Primary pulmonary hypertension (HCC)    not seen at CATH  (72m Hg), pt not aware of this  . Type II or unspecified type diabetes mellitus with other specified manifestations, not stated as uncontrolled    [facility] Normal coronary arteries  . Unspecified essential hypertension    [facility] No significant renal artery stenosis bilaterally    Patient Active Problem List   Diagnosis Date Noted  . UTI (urinary tract infection) 01/30/2015  . Acute renal failure superimposed on stage 4 chronic kidney disease ([place] 01/28/2015  . Foot abscess, left 01/27/2015  . Uncontrolled type 2 diabetes mellitus with foot ulcer ([place] [place]   . Cellulitis of left foot 01/24/2015  . Thrombocytopenia ([place] 01/24/2015  .  Dehydration, moderate 01/24/2015  . CKD (chronic kidney disease), stage V ([place]) 01/24/2015  . Type 2 diabetes mellitus with left diabetic foot ulcer ([place]) 12/24/2014  . Type 2 diabetes mellitus with right diabetic foot ulcer ([place]) 12/24/2014  . Type II diabetes mellitus with neurological manifestations ([place]) 12/24/2014  . Acute renal failure superimposed on stage 3 chronic kidney disease ([place]) 03/29/2014  . Neurogenic bladder/suspected 03/29/2014  . 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 HTN (hypertension) 03/29/2014  . Menorrhagia 07/19/2011  . Tricuspid regurgitation 10/30/2010  . Anemia in  chronic kidney disease 10/30/2010  . Dysautonomia ([place]) 08/20/2010  . Chronic diastolic heart failure ([place]) 07/10/2010  . Diabetes mellitus type 2, uncontrolled ([place]) 04/04/2007  . DEPRESSION 09/08/2006  . Peripheral neuropathy ([place]) 09/08/2006  . CHOLELITHIASIS 09/08/2006    Past Surgical History:  Procedure Laterality Date  . AMPUTATION Left 01/26/2015   Procedure: POST TRANS MET ;  Surgeon: Newt Minion, MD;  Location: [facility];  Service: Orthopedics;  Laterality: Left;  . APPENDECTOMY    . AV FISTULA PLACEMENT Right 01/08/2015   Procedure: ARTERIOVENOUS (AV) FISTULA CREATION;  Surgeon: Angelia Mould, MD;  Location: [facility];  Service: Vascular;  Laterality: Right;  . CESAREAN SECTION  2001; 1999; 1994  . DILATION AND CURETTAGE OF UTERUS    . EYE SURGERY    . INCISION AND DRAINAGE OF WOUND Right 2010   "serious staph infection"  . PARS PLANA VITRECTOMY Bilateral 2010   "related to DM"  . TONSILLECTOMY AND ADENOIDECTOMY       OB History   No obstetric history on file.      Home Medications    Prior to Admission medications   Medication Sig Start Date End Date Taking? Authorizing Provider  AURYXIA 1 GM 210 MG(FE) TABS Take 210 mg by mouth 3 (three) times daily with meals. 01/01/15   [provider]  calcitRIOL (ROCALTROL) 0.25 MCG capsule Take 0.25 mcg by mouth every other day.    [provider]  calcium acetate (PHOSLO) 667 MG capsule Take 667 mg by mouth 2 (two) times daily.    [provider]  doxycycline (VIBRA-TABS) 100 MG tablet Take 1 tablet (100 mg total) by mouth every 12 (twelve) hours. 02/03/15   Orson Eva, MD  Epoetin Alfa (PROCRIT IJ) Inject as directed every 21 ( twenty-one) days.    [provider]  felodipine (PLENDIL) 5 MG 24 hr tablet Take 5 mg by mouth at bedtime. 12/16/14   [provider]  fluconazole (DIFLUCAN) 100 MG tablet Take 1 tablet (100 mg total) by mouth daily. 02/03/15   Orson Eva, MD  FLUoxetine  (PROZAC) 20 MG capsule Take 20 mg by mouth every morning.     [provider]  glucose blood (ONETOUCH VERIO) test strip 1 each by Other route 2 (two) times daily. And lancets 2/day 250/.61 05/25/12   Renato Shin, MD  insulin detemir (LEVEMIR) 100 UNIT/ML injection Inject 13 Units into the skin 2 (two) times daily.     [provider]  linagliptin (TRADJENTA) 5 MG TABS tablet Take 5 mg by mouth daily.    [provider]  Nutritional Supplements (FEEDING SUPPLEMENT, NEPRO CARB STEADY,) LIQD Take 237 mLs by mouth 2 (two) times daily between meals. 02/03/15   Orson Eva, MD  oxyCODONE-acetaminophen (ROXICET) 5-325 MG tablet Take 1 tablet by mouth every 6 (six) hours as needed. 02/03/15   Orson Eva, MD  sodium bicarbonate 650 MG tablet Take 1,300 mg by  mouth 2 (two) times daily.    [provider]    Family History Family History  Problem Relation Age of Onset  . Hypertension Mother   . Myelodysplastic syndrome Father   . Pancreatic cancer Other     Social History Social History   Tobacco Use  . Smoking status: Never Smoker  . Smokeless tobacco: Never Used  Substance Use Topics  . Alcohol use: Yes    Comment: 03/29/2014 "might have a drink a couple times/yr"  . Drug use: No     Allergies   Bactrim, Byetta 10 mcg pen [exenatide], Hctz [hydrochlorothiazide], Spironolactone, Sulfa antibiotics, Sulfacetamide sodium, and Sulfamethoxazole-trimethoprim   Review of Systems Review of Systems  Constitutional: Positive for fever. Negative for chills.  HENT: Negative for congestion and sore throat.   Eyes: Negative for visual disturbance.  Respiratory: Negative for cough and shortness of breath.   Cardiovascular: Negative for chest pain.  Gastrointestinal: Positive for diarrhea and nausea. Negative for abdominal pain and vomiting.  Genitourinary: Negative for difficulty urinating and dysuria.  Musculoskeletal: Positive for arthralgias.  Neurological:  Positive for dizziness. Negative for headaches.     Physical Exam Updated Vital Signs BP (!) 156/98   Pulse (!) 101   Temp (!) 100.7 F (38.2 C) (Oral)   Resp (!) 22   Ht _0  (1.676 m)   SpO2 100%   BMI 27.76 kg/m   Physical Exam Vitals signs and nursing note reviewed.  Constitutional:      General: She is not in acute distress.    Appearance: Normal appearance. She is well-developed. She is not ill-appearing.  HENT:     Head: Normocephalic and atraumatic.  Eyes:     General: No scleral icterus.       Right eye: No discharge.        Left eye: No discharge.     Conjunctiva/sclera: Conjunctivae normal.     Pupils: Pupils are equal, round, and reactive to light.  Neck:     Musculoskeletal: Normal range of motion.  Cardiovascular:     Rate and Rhythm: Normal rate and regular rhythm.     Heart sounds: Murmur (loud murmur in the RUSB) present.  Pulmonary:     Effort: Pulmonary effort is normal. No respiratory distress.     Breath sounds: Normal breath sounds.  Abdominal:     General: There is no distension.     Palpations: Abdomen is soft.     Tenderness: There is no abdominal tenderness.  Musculoskeletal:     Comments: Left lower extremity: S/p transmetatarsal amputation. There is a nodular feeling area over the medial ankle which feels like scar tissue. There is no wound, redness, fluctuance, drainage.  RLE: No redness, warmth, wounds. 2+radial pulse  Skin:    General: Skin is warm and dry.     Findings: Rash (multiple excoriations on the back) present.  Neurological:     Mental Status: She is alert and oriented to person, place, and time.  Psychiatric:        Attention and Perception: Attention normal.        Mood and Affect: Mood is anxious.        Behavior: Behavior normal. Behavior is cooperative.      ED Treatments / Results  Labs (all labs ordered are listed, but only abnormal results are displayed) Labs Reviewed  URINE CULTURE  SARS CORONAVIRUS 2  (TAT 6-24 HRS)  CULTURE, BLOOD (ROUTINE X 2)  CULTURE, BLOOD (ROUTINE X 2)  LACTIC ACID, PLASMA  LACTIC ACID, PLASMA  COMPREHENSIVE METABOLIC PANEL  CBC WITH DIFFERENTIAL/PLATELET  URINALYSIS, ROUTINE W REFLEX MICROSCOPIC    EKG EKG Interpretation  Date/Time:  Saturday November 05 2018 22:08:30 EDT Ventricular Rate:  97 PR Interval:    QRS Duration: 96 QT Interval:  367 QTC Calculation: 467 R Axis:   95 Text Interpretation:  Sinus rhythm Borderline right axis deviation Borderline low voltage, extremity leads Anteroseptal infarct, old Confirmed by Davonna Belling [phone]) on 11/05/2018 10:54:46 PM   Radiology Dg Chest Port 1 View  Result Date: 11/05/2018 CLINICAL DATA:  Fever. EXAM: PORTABLE CHEST 1 VIEW COMPARISON:  Chest x-ray dated January 28, 2015. FINDINGS: The heart size and mediastinal contours are within normal limits. Both lungs are clear. The visualized skeletal structures are unremarkable. IMPRESSION: No active disease. Electronically Signed   By: Titus Dubin M.D.   On: 11/05/2018 22:18    Procedures Procedures (including critical care time)  Medications Ordered in ED Medications  sodium chloride 0.9 % bolus 1,000 mL (has no administration in time range)  acetaminophen (TYLENOL) tablet 650 mg (650 mg Oral Given 11/05/18 2203)     Initial Impression / Assessment and Plan / ED Course  I have reviewed the triage vital signs and the nursing notes.  Pertinent labs & imaging results that were available during my care of the patient were reviewed by me and considered in my medical decision making (see chart for details).  53 year old female presents with a fever. She is also reporting "bumps" on the LLE although I do no see evidence of a superimposed infection and there is no wound. She has also had diarrhea which has resolved and some vague dizziness. Otherwise she denies any other symptoms. She is febrile to 100.9 here. HR is 101 in triage with RR of 22. She is  hypertensive and O2 sats are normal. Will obtain labs, CXR, xray of the left foot.   At shift change, labs are pending.  Chest x-ray is negative.  Discussed case with Dr. Alvino Chapel.  Will have low threshold to admit due to meeting SIRS criteria and immunocompromised status.  Care was signed out to Quincy Carnes, PA-C who will dispo accordingly  Final Clinical Impressions(s) / ED Diagnoses   Final diagnoses:  Fever, unspecified fever cause    ED Discharge Orders    None       Recardo Evangelist, PA-C 11/06/18 1558    Davonna Belling, MD 11/06/18 2314

## 2018-11-06 ENCOUNTER — Inpatient Hospital Stay (HOSPITAL_COMMUNITY): Payer: Commercial Managed Care - PPO

## 2018-11-06 DIAGNOSIS — I5032 Chronic diastolic (congestive) heart failure: Secondary | ICD-10-CM | POA: Diagnosis present

## 2018-11-06 DIAGNOSIS — N390 Urinary tract infection, site not specified: Secondary | ICD-10-CM | POA: Diagnosis present

## 2018-11-06 DIAGNOSIS — R21 Rash and other nonspecific skin eruption: Secondary | ICD-10-CM | POA: Diagnosis present

## 2018-11-06 DIAGNOSIS — I872 Venous insufficiency (chronic) (peripheral): Secondary | ICD-10-CM | POA: Diagnosis present

## 2018-11-06 DIAGNOSIS — F411 Generalized anxiety disorder: Secondary | ICD-10-CM | POA: Diagnosis present

## 2018-11-06 DIAGNOSIS — M79672 Pain in left foot: Secondary | ICD-10-CM | POA: Diagnosis present

## 2018-11-06 DIAGNOSIS — R239 Unspecified skin changes: Secondary | ICD-10-CM

## 2018-11-06 DIAGNOSIS — R011 Cardiac murmur, unspecified: Secondary | ICD-10-CM

## 2018-11-06 DIAGNOSIS — E11319 Type 2 diabetes mellitus with unspecified diabetic retinopathy without macular edema: Secondary | ICD-10-CM | POA: Diagnosis present

## 2018-11-06 DIAGNOSIS — E1122 Type 2 diabetes mellitus with diabetic chronic kidney disease: Secondary | ICD-10-CM | POA: Diagnosis present

## 2018-11-06 DIAGNOSIS — Z881 Allergy status to other antibiotic agents status: Secondary | ICD-10-CM

## 2018-11-06 DIAGNOSIS — Z992 Dependence on renal dialysis: Secondary | ICD-10-CM

## 2018-11-06 DIAGNOSIS — E1142 Type 2 diabetes mellitus with diabetic polyneuropathy: Secondary | ICD-10-CM

## 2018-11-06 DIAGNOSIS — Z87442 Personal history of urinary calculi: Secondary | ICD-10-CM | POA: Diagnosis not present

## 2018-11-06 DIAGNOSIS — K589 Irritable bowel syndrome without diarrhea: Secondary | ICD-10-CM

## 2018-11-06 DIAGNOSIS — D631 Anemia in chronic kidney disease: Secondary | ICD-10-CM

## 2018-11-06 DIAGNOSIS — Z89432 Acquired absence of left foot: Secondary | ICD-10-CM | POA: Diagnosis not present

## 2018-11-06 DIAGNOSIS — D696 Thrombocytopenia, unspecified: Secondary | ICD-10-CM | POA: Diagnosis not present

## 2018-11-06 DIAGNOSIS — Z20828 Contact with and (suspected) exposure to other viral communicable diseases: Secondary | ICD-10-CM | POA: Diagnosis present

## 2018-11-06 DIAGNOSIS — D72810 Lymphocytopenia: Secondary | ICD-10-CM | POA: Diagnosis not present

## 2018-11-06 DIAGNOSIS — Z79899 Other long term (current) drug therapy: Secondary | ICD-10-CM

## 2018-11-06 DIAGNOSIS — E785 Hyperlipidemia, unspecified: Secondary | ICD-10-CM

## 2018-11-06 DIAGNOSIS — F419 Anxiety disorder, unspecified: Secondary | ICD-10-CM

## 2018-11-06 DIAGNOSIS — G63 Polyneuropathy in diseases classified elsewhere: Secondary | ICD-10-CM | POA: Diagnosis not present

## 2018-11-06 DIAGNOSIS — F329 Major depressive disorder, single episode, unspecified: Secondary | ICD-10-CM | POA: Diagnosis present

## 2018-11-06 DIAGNOSIS — E1143 Type 2 diabetes mellitus with diabetic autonomic (poly)neuropathy: Secondary | ICD-10-CM | POA: Diagnosis present

## 2018-11-06 DIAGNOSIS — Z882 Allergy status to sulfonamides status: Secondary | ICD-10-CM | POA: Diagnosis not present

## 2018-11-06 DIAGNOSIS — I132 Hypertensive heart and chronic kidney disease with heart failure and with stage 5 chronic kidney disease, or end stage renal disease: Secondary | ICD-10-CM | POA: Diagnosis present

## 2018-11-06 DIAGNOSIS — Z794 Long term (current) use of insulin: Secondary | ICD-10-CM

## 2018-11-06 DIAGNOSIS — N185 Chronic kidney disease, stage 5: Secondary | ICD-10-CM | POA: Diagnosis present

## 2018-11-06 DIAGNOSIS — N184 Chronic kidney disease, stage 4 (severe): Secondary | ICD-10-CM | POA: Diagnosis not present

## 2018-11-06 DIAGNOSIS — R509 Fever, unspecified: Secondary | ICD-10-CM | POA: Diagnosis not present

## 2018-11-06 DIAGNOSIS — Z89422 Acquired absence of other left toe(s): Secondary | ICD-10-CM

## 2018-11-06 DIAGNOSIS — K529 Noninfective gastroenteritis and colitis, unspecified: Secondary | ICD-10-CM | POA: Diagnosis present

## 2018-11-06 DIAGNOSIS — Z888 Allergy status to other drugs, medicaments and biological substances status: Secondary | ICD-10-CM

## 2018-11-06 DIAGNOSIS — L02426 Furuncle of left lower limb: Secondary | ICD-10-CM | POA: Diagnosis present

## 2018-11-06 DIAGNOSIS — F339 Major depressive disorder, recurrent, unspecified: Secondary | ICD-10-CM

## 2018-11-06 DIAGNOSIS — E11649 Type 2 diabetes mellitus with hypoglycemia without coma: Secondary | ICD-10-CM | POA: Diagnosis not present

## 2018-11-06 DIAGNOSIS — E1165 Type 2 diabetes mellitus with hyperglycemia: Secondary | ICD-10-CM | POA: Diagnosis not present

## 2018-11-06 LAB — COMPREHENSIVE METABOLIC PANEL
ALT: 19 U/L (ref 0–44)
AST: 21 U/L (ref 15–41)
Albumin: 3.4 g/dL — ABNORMAL LOW (ref 3.5–5.0)
Alkaline Phosphatase: 59 U/L (ref 38–126)
Anion gap: 13 (ref 5–15)
BUN: 74 mg/dL — ABNORMAL HIGH (ref 6–20)
CO2: 24 mmol/L (ref 22–32)
Calcium: 9.8 mg/dL (ref 8.9–10.3)
Chloride: 99 mmol/L (ref 98–111)
Creatinine, Ser: 3.59 mg/dL — ABNORMAL HIGH (ref 0.44–1.00)
GFR calc Af Amer: 16 mL/min — ABNORMAL LOW (ref >60)
GFR calc non Af Amer: 14 mL/min — ABNORMAL LOW (ref >60)
Glucose, Bld: 262 mg/dL — ABNORMAL HIGH (ref 70–99)
Potassium: 4.5 mmol/L (ref 3.5–5.1)
Sodium: 136 mmol/L (ref 135–145)
Total Bilirubin: 0.6 mg/dL (ref 0.3–1.2)
Total Protein: 7 g/dL (ref 6.5–8.1)

## 2018-11-06 LAB — CBC
HCT: 35.6 % — ABNORMAL LOW (ref 36.0–46.0)
Hemoglobin: 11.4 g/dL — ABNORMAL LOW (ref 12.0–15.0)
MCH: 29.8 pg (ref 26.0–34.0)
MCHC: 32 g/dL (ref 30.0–36.0)
MCV: 93 fL (ref 80.0–100.0)
Platelets: 146 10*3/uL — ABNORMAL LOW (ref 150–400)
RBC: 3.83 MIL/uL — ABNORMAL LOW (ref 3.87–5.11)
RDW: 13.5 % (ref 11.5–15.5)
WBC: 4.2 10*3/uL (ref 4.0–10.5)
nRBC: 0 % (ref 0.0–0.2)

## 2018-11-06 LAB — CBC WITH DIFFERENTIAL/PLATELET
Abs Immature Granulocytes: 0.04 10*3/uL (ref 0.00–0.07)
Basophils Absolute: 0 10*3/uL (ref 0.0–0.1)
Basophils Relative: 1 %
Eosinophils Absolute: 0 10*3/uL (ref 0.0–0.5)
Eosinophils Relative: 0 %
HCT: 36.1 % (ref 36.0–46.0)
Hemoglobin: 11.4 g/dL — ABNORMAL LOW (ref 12.0–15.0)
Immature Granulocytes: 1 %
Lymphocytes Relative: 7 %
Lymphs Abs: 0.3 10*3/uL — ABNORMAL LOW (ref 0.7–4.0)
MCH: 29.2 pg (ref 26.0–34.0)
MCHC: 31.6 g/dL (ref 30.0–36.0)
MCV: 92.6 fL (ref 80.0–100.0)
Monocytes Absolute: 0.3 10*3/uL (ref 0.1–1.0)
Monocytes Relative: 6 %
Neutro Abs: 3.7 10*3/uL (ref 1.7–7.7)
Neutrophils Relative %: 85 %
Platelets: 160 10*3/uL (ref 150–400)
RBC: 3.9 MIL/uL (ref 3.87–5.11)
RDW: 13.4 % (ref 11.5–15.5)
WBC: 4.4 10*3/uL (ref 4.0–10.5)
nRBC: 0 % (ref 0.0–0.2)

## 2018-11-06 LAB — URINALYSIS, ROUTINE W REFLEX MICROSCOPIC
Bilirubin Urine: NEGATIVE
Glucose, UA: 50 mg/dL — AB
Ketones, ur: NEGATIVE mg/dL
Nitrite: NEGATIVE
Protein, ur: 100 mg/dL — AB
Specific Gravity, Urine: 1.011 (ref 1.005–1.030)
pH: 7 (ref 5.0–8.0)

## 2018-11-06 LAB — BASIC METABOLIC PANEL
Anion gap: 14 (ref 5–15)
BUN: 72 mg/dL — ABNORMAL HIGH (ref 6–20)
CO2: 21 mmol/L — ABNORMAL LOW (ref 22–32)
Calcium: 9.3 mg/dL (ref 8.9–10.3)
Chloride: 103 mmol/L (ref 98–111)
Creatinine, Ser: 3.29 mg/dL — ABNORMAL HIGH (ref 0.44–1.00)
GFR calc Af Amer: 17 mL/min — ABNORMAL LOW (ref >60)
GFR calc non Af Amer: 15 mL/min — ABNORMAL LOW (ref >60)
Glucose, Bld: 189 mg/dL — ABNORMAL HIGH (ref 70–99)
Potassium: 4.3 mmol/L (ref 3.5–5.1)
Sodium: 138 mmol/L (ref 135–145)

## 2018-11-06 LAB — I-STAT BETA HCG BLOOD, ED (MC, WL, AP ONLY): I-stat hCG, quantitative: <5 m[IU]/mL (ref <5)

## 2018-11-06 LAB — CBG MONITORING, ED
Glucose-Capillary: 119 mg/dL — ABNORMAL HIGH (ref 70–99)
Glucose-Capillary: 90 mg/dL (ref 70–99)

## 2018-11-06 LAB — C-REACTIVE PROTEIN: CRP: 1.8 mg/dL — ABNORMAL HIGH (ref <1.0)

## 2018-11-06 LAB — HEMOGLOBIN A1C
Hgb A1c MFr Bld: 8.4 % — ABNORMAL HIGH (ref 4.8–5.6)
Mean Plasma Glucose: 194.38 mg/dL

## 2018-11-06 LAB — GLUCOSE, CAPILLARY
Glucose-Capillary: 123 mg/dL — ABNORMAL HIGH (ref 70–99)
Glucose-Capillary: 85 mg/dL (ref 70–99)

## 2018-11-06 LAB — PROTIME-INR
INR: 1.4 — ABNORMAL HIGH (ref 0.8–1.2)
Prothrombin Time: 16.6 seconds — ABNORMAL HIGH (ref 11.4–15.2)

## 2018-11-06 LAB — PREALBUMIN: Prealbumin: 21.4 mg/dL (ref 18–38)

## 2018-11-06 LAB — SARS CORONAVIRUS 2 (TAT 6-24 HRS): SARS Coronavirus 2: NEGATIVE

## 2018-11-06 LAB — MRSA PCR SCREENING: MRSA by PCR: NEGATIVE

## 2018-11-06 LAB — LACTIC ACID, PLASMA
Lactic Acid, Venous: 1.2 mmol/L (ref 0.5–1.9)
Lactic Acid, Venous: 1 mmol/L (ref 0.5–1.9)

## 2018-11-06 LAB — HIV ANTIBODY (ROUTINE TESTING W REFLEX): HIV Screen 4th Generation wRfx: NONREACTIVE

## 2018-11-06 LAB — SEDIMENTATION RATE: Sed Rate: 59 mm/hr — ABNORMAL HIGH (ref 0–22)

## 2018-11-06 MED ORDER — INSULIN GLARGINE 100 UNIT/ML ~~LOC~~ SOLN
10.0000 [IU] | Freq: Every day | SUBCUTANEOUS | Status: DC
  Filled 2018-11-06: qty 0.1

## 2018-11-06 MED ORDER — ENOXAPARIN SODIUM 30 MG/0.3ML ~~LOC~~ SOLN
30.0000 mg | Freq: Every day | SUBCUTANEOUS | Status: DC
  Administered 2018-11-06 – 2018-11-08 (×3): 30 mg via SUBCUTANEOUS
  Filled 2018-11-06 (×4): qty 0.3

## 2018-11-06 MED ORDER — SODIUM CHLORIDE 0.9 % IV SOLN
INTRAVENOUS | Status: DC
  Administered 2018-11-06: 03:00:00 via INTRAVENOUS

## 2018-11-06 MED ORDER — CALCITRIOL 0.25 MCG PO CAPS
0.2500 ug | ORAL_CAPSULE | ORAL | Status: DC
  Administered 2018-11-06 – 2018-11-08 (×2): 0.25 ug via ORAL
  Filled 2018-11-06 (×3): qty 1

## 2018-11-06 MED ORDER — VANCOMYCIN HCL IN DEXTROSE 1-5 GM/200ML-% IV SOLN: 1000.0000 mg | INTRAVENOUS | Status: DC

## 2018-11-06 MED ORDER — SODIUM CHLORIDE 0.9 % IV SOLN
1.0000 g | INTRAVENOUS | Status: DC
  Administered 2018-11-06 – 2018-11-07 (×2): 1 g via INTRAVENOUS
  Filled 2018-11-06: qty 10

## 2018-11-06 MED ORDER — PIPERACILLIN-TAZOBACTAM 3.375 G IVPB 30 MIN
3.3750 g | Freq: Once | INTRAVENOUS | Status: AC
  Administered 2018-11-06: 3.375 g via INTRAVENOUS
  Filled 2018-11-06: qty 50

## 2018-11-06 MED ORDER — INSULIN ASPART 100 UNIT/ML ~~LOC~~ SOLN
0.0000 [IU] | Freq: Three times a day (TID) | SUBCUTANEOUS | Status: DC
  Administered 2018-11-06 – 2018-11-07 (×2): 2 [IU] via SUBCUTANEOUS
  Administered 2018-11-08: 8 [IU] via SUBCUTANEOUS

## 2018-11-06 MED ORDER — ROSUVASTATIN CALCIUM 5 MG PO TABS
10.0000 mg | ORAL_TABLET | Freq: Every day | ORAL | Status: DC
  Administered 2018-11-06 – 2018-11-07 (×2): 10 mg via ORAL
  Filled 2018-11-06 (×3): qty 2

## 2018-11-06 MED ORDER — SODIUM CHLORIDE 0.9% FLUSH
3.0000 mL | Freq: Two times a day (BID) | INTRAVENOUS | Status: DC
  Administered 2018-11-06 – 2018-11-08 (×6): 3 mL via INTRAVENOUS

## 2018-11-06 MED ORDER — OXYCODONE-ACETAMINOPHEN 5-325 MG PO TABS
1.0000 | ORAL_TABLET | Freq: Four times a day (QID) | ORAL | Status: DC | PRN
  Administered 2018-11-06: 1 via ORAL
  Filled 2018-11-06: qty 1

## 2018-11-06 MED ORDER — ONDANSETRON HCL 4 MG PO TABS: 4.0000 mg | ORAL_TABLET | Freq: Four times a day (QID) | ORAL | Status: DC | PRN

## 2018-11-06 MED ORDER — ACETAMINOPHEN 325 MG PO TABS
650.0000 mg | ORAL_TABLET | Freq: Four times a day (QID) | ORAL | Status: DC | PRN
  Administered 2018-11-06 – 2018-11-07 (×2): 650 mg via ORAL
  Filled 2018-11-06 (×2): qty 2

## 2018-11-06 MED ORDER — INSULIN GLARGINE 100 UNIT/ML ~~LOC~~ SOLN
10.0000 [IU] | Freq: Every day | SUBCUTANEOUS | Status: DC
  Administered 2018-11-06: 10 [IU] via SUBCUTANEOUS
  Filled 2018-11-06: qty 0.1

## 2018-11-06 MED ORDER — SODIUM CHLORIDE 0.9 % IV SOLN
1.0000 g | INTRAVENOUS | Status: DC
  Administered 2018-11-06: 1 g via INTRAVENOUS
  Filled 2018-11-06: qty 1

## 2018-11-06 MED ORDER — HYDROMORPHONE HCL 1 MG/ML IJ SOLN
0.5000 mg | INTRAMUSCULAR | Status: DC | PRN
  Administered 2018-11-06 (×4): 0.5 mg via INTRAVENOUS
  Filled 2018-11-06: qty 0.5
  Filled 2018-11-06: qty 1
  Filled 2018-11-06: qty 0.5
  Filled 2018-11-06: qty 1

## 2018-11-06 MED ORDER — FLUOXETINE HCL 20 MG PO CAPS
40.0000 mg | ORAL_CAPSULE | Freq: Every morning | ORAL | Status: DC
  Administered 2018-11-06 – 2018-11-08 (×3): 40 mg via ORAL
  Filled 2018-11-06 (×3): qty 2

## 2018-11-06 MED ORDER — FELODIPINE ER 5 MG PO TB24
5.0000 mg | ORAL_TABLET | Freq: Every day | ORAL | Status: DC
  Administered 2018-11-06 – 2018-11-07 (×2): 5 mg via ORAL
  Filled 2018-11-06 (×3): qty 1

## 2018-11-06 MED ORDER — INSULIN ASPART 100 UNIT/ML ~~LOC~~ SOLN: 0.0000 [IU] | Freq: Every day | SUBCUTANEOUS | Status: DC

## 2018-11-06 MED ORDER — VANCOMYCIN HCL 10 G IV SOLR
1250.0000 mg | Freq: Once | INTRAVENOUS | Status: AC
  Administered 2018-11-06: 1250 mg via INTRAVENOUS
  Filled 2018-11-06: qty 1250

## 2018-11-06 MED ORDER — BENAZEPRIL HCL 40 MG PO TABS
40.0000 mg | ORAL_TABLET | Freq: Every day | ORAL | Status: DC
  Administered 2018-11-06 – 2018-11-07 (×2): 40 mg via ORAL
  Filled 2018-11-06 (×3): qty 1

## 2018-11-06 MED ORDER — ONDANSETRON HCL 4 MG/2ML IJ SOLN: 4.0000 mg | Freq: Four times a day (QID) | INTRAMUSCULAR | Status: DC | PRN

## 2018-11-06 MED ORDER — FUROSEMIDE 40 MG PO TABS
40.0000 mg | ORAL_TABLET | Freq: Every day | ORAL | Status: DC
  Administered 2018-11-06 – 2018-11-08 (×3): 40 mg via ORAL
  Filled 2018-11-06: qty 1
  Filled 2018-11-06: qty 2
  Filled 2018-11-06: qty 1

## 2018-11-06 MED ORDER — VANCOMYCIN HCL IN DEXTROSE 1-5 GM/200ML-% IV SOLN: 1000.0000 mg | Freq: Once | INTRAVENOUS | Status: DC

## 2018-11-06 MED ORDER — SODIUM BICARBONATE 650 MG PO TABS
1300.0000 mg | ORAL_TABLET | Freq: Two times a day (BID) | ORAL | Status: DC
  Administered 2018-11-06 – 2018-11-08 (×5): 1300 mg via ORAL
  Filled 2018-11-06 (×6): qty 2

## 2018-11-06 MED ORDER — ZOLPIDEM TARTRATE 5 MG PO TABS
5.0000 mg | ORAL_TABLET | Freq: Every evening | ORAL | Status: DC | PRN
  Administered 2018-11-07: 5 mg via ORAL
  Filled 2018-11-06: qty 1

## 2018-11-06 MED ORDER — SEVELAMER CARBONATE 800 MG PO TABS
800.0000 mg | ORAL_TABLET | Freq: Two times a day (BID) | ORAL | Status: DC
  Administered 2018-11-06 – 2018-11-08 (×4): 800 mg via ORAL
  Filled 2018-11-06 (×5): qty 1

## 2018-11-06 NOTE — ED Provider Notes (Signed)
Assumed care from prior team at shift change.  See prior notes for full H&P.  Briefly, 53 y.o. F with hx of DM with CKD, hx of transmetatarsal amputation of left foot. Here with fever and new pain in left foot (usually cannot feel this).  CXR clear.  Foot films with possible new osteomyelitis.  Currently seeing orthopedics in [place].  Labs still pending, patient is a difficulty IV stick.  Plan:  Labs pending.   Results for orders placed or performed during the hospital encounter of 11/05/18  I-Stat beta hCG blood, ED  Result Value Ref Range   I-stat hCG, quantitative <5.0 <5 mIU/mL   Comment 3           Dg Chest Port 1 View  Result Date: 11/05/2018 CLINICAL DATA:  Fever. EXAM: PORTABLE CHEST 1 VIEW COMPARISON:  Chest x-ray dated January 28, 2015. FINDINGS: The heart size and mediastinal contours are within normal limits. Both lungs are clear. The visualized skeletal structures are unremarkable. IMPRESSION: No active disease. Electronically Signed   By: Titus Dubin M.D.   On: 11/05/2018 22:18   Dg Foot Complete Left  Result Date: 11/06/2018 CLINICAL DATA:  Foot pain EXAM: LEFT FOOT - COMPLETE 3+ VIEW COMPARISON:  Numerous priors, most recent comparison 10/31/2015 radiograph. FINDINGS: Patient has undergone amputation of all 5 rays of level of the tarsometatarsal joints with some residual osseous fragments from the bases of the fourth fifth metatarsals seen along the lateral foot. There is extensive overlying soft tissue swelling and irregularity but without subcutaneous gas or unexpected foreign body evident. There is a questionable area of subcortical lucency involving the distal pole of the lateral cuneiform. Plantar calcaneal spur is noted. Vascular calcium noted in the soft tissues. IMPRESSION: Postoperative changes from prior trans metatarsal amputation of all 5 rays. Soft tissue swelling and irregularity along the anterior soft tissues. Small amount of subcortical lucency involving the  lateral cuneiform could reflect early features of osteomyelitis. Electronically Signed   By: Lovena Le M.D.   On: 11/06/2018 00:00    12:23 AM Discussed with orthopedics, Dr. Marlou Sa-- does recommend to start abx, get MRI foot to assess for osteo, team will see her in AM.  Labs overall reassuring.  She does have CKD, SrCr appears around her baseline if not a little improved from prior.  Normal WBC count, normal lactate.  I have started vanc/zosyn.  Blood cultures pending along with COVID swab.  Patient was updated on results and need for admission, she is agreeable.  She did request not to see Dr.Duda from orthopedics as she reports an "unpleasant experience" with him previously.  Will consult medicine for admission.  Discussed with Dr. Daryll Drown-- she will admit.   Larene Pickett, PA-C 11/06/18 [id number]    Merrily Pew, MD 11/06/18 [phone]

## 2018-11-06 NOTE — ED Notes (Signed)
Pt transported to MRI 

## 2018-11-06 NOTE — Progress Notes (Signed)
Patient is seen and examined. pls see today's H&P. 53y.o. female with medical history significant of HTN, DM2, IBS, CKD, HLD, neuropathy, anxiety who presents for 1 day of fever, probable due to UTI. Initially thought possibly left osteomyelitis due to x ray imaging. But MRI showed no osteomyelitis. No obvious cellulitis on exam. No acute respiratory symptoms. CXR: no clear infiltrates. covid 19: negative.  Cont ceftriaxone for UTI, f/u cultures. Ortho is consulted  [name]

## 2018-11-06 NOTE — H&P (Signed)
History and Physical    Holly [name] EHO:[id number] DOB: 01/02/1966 DOA: 11/05/2018  PCP: Dr. Valora Piccolo Patient coming from: Home  Chief Complaint: fever  HPI: Holly [name] is a 53 y.o. female with medical history significant of HTN, DM2, IBS, CKD, HLD, neuropathy, anxiety who presents for 1 day of fever up to 101.5 at home.  She notes that she has been having pain in the left foot which shoots up to her knee.  She has a history of amputation on that foot.  She is very anxious about the possibility of having another infection and surgery.  She notes that she has had no sick contacts.  She had 2 boils on that leg, but they have been healing are mostly gone now.  She has no other symptoms and denies chest pain, cough, chills, headache, nausea, vomiting, diarrhea, abdominal pain,change in skin, new wounds, dysuria.  She took tylenol for the fever, but his has not helped with the pain which is sharp in nature.    ED Course: In the ED, she was noted to be hypertensive and her Tmax was 100.9.  Blood work revealed elevated glucose and Cr, but this was improved from her baseline.  She had a WBC of 4.4 with mild lymphopenia.  Xray of the foot showed signs which could indicate early osteomyelitis.  The EDP called orthopedics which recommended MRI of the foot which has been ordered.   Review of Systems: As per HPI otherwise 10 point review of systems negative.   Past Medical History:  Diagnosis Date  . Acute cystitis   . Anxiety state, unspecified   . Background diabetic retinopathy(362.01)   . Bulimia   . Calculus of kidney   . Cellulitis and abscess of foot 01/24/2015   left foot  . 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 Chronic kidney disease, stage IV (severe) ([place])   . Complication of anesthesia    slow to wake up, had lost a lot of blood - 2010  . Diarrhea    symptomatic  . Disorders of magnesium metabolism   . DM2 (diabetes mellitus, type 2) ([name])   . Dysthymic  disorder   . Dysuria   . Edema    moderate  . Encounter for long-term (current) use of other medications   . Esophageal reflux   . Essential hypertension, benign   . Heart murmur    been told very slight, never given her any problems  . History of blood transfusion X 6-7; 2010 - present (03/29/2014)   "related to OR; kidney issues"  . HLD (hyperlipidemia)    HDL goal >50, LDL goal <100  . Iron deficiency anemia    "suppose to get procrit injections q 3 wks; usually don't" do it" (03/29/2014)  . Irritable bowel syndrome   . Left heart failure ([place])   . Muscle weakness (generalized)   . Neuralgia, neuritis, and radiculitis, unspecified   . Palpitations   . Peripheral autonomic neuropathy in disorders classified elsewhere(337.1)   . Pneumonia 2010  . Primary pulmonary hypertension (HCC)    not seen at CATH  (19m Hg), pt not aware of this  . Type II or unspecified type diabetes mellitus with other specified manifestations, not stated as uncontrolled    [facility] Normal coronary arteries  . Unspecified essential hypertension    [facility] No significant renal artery stenosis bilaterally    Past  Surgical History:  Procedure Laterality Date  . AMPUTATION Left 01/26/2015   Procedure: POST TRANS MET ;  Surgeon: Newt Minion, MD;  Location: [place];  Service: Orthopedics;  Laterality: Left;  . APPENDECTOMY    . AV FISTULA PLACEMENT Right 01/08/2015   Procedure: ARTERIOVENOUS (AV) FISTULA CREATION;  Surgeon: Angelia Mould, MD;  Location: [place];  Service: Vascular;  Laterality: Right;  . CESAREAN SECTION  2001; 1999; 1994  . DILATION AND CURETTAGE OF UTERUS    . EYE SURGERY    . INCISION AND DRAINAGE OF WOUND Right 2010   "serious staph infection"  . PARS PLANA VITRECTOMY Bilateral 2010   "related to DM"  . TONSILLECTOMY AND ADENOIDECTOMY       reports that she has never  smoked. She has never used smokeless tobacco. She reports current alcohol use. She reports that she does not use drugs.  Allergies  Allergen Reactions  . Bactrim Nausea And Vomiting  . Byetta 10 Mcg Pen [Exenatide] Nausea And Vomiting  . Hctz [Hydrochlorothiazide] Other (See Comments) and Itching    dizziness dizziness  . Spironolactone Other (See Comments)    Dizziness   . Sulfa Antibiotics Nausea And Vomiting  . Sulfacetamide Sodium Nausea And Vomiting  . Sulfamethoxazole-Trimethoprim Nausea And Vomiting   Reviewed.  Family History  Problem Relation Age of Onset  . Hypertension Mother   . Myelodysplastic syndrome Father   . Pancreatic cancer Other     Current medications:  Benazepril 34m daily Calcitriol 0.259mdaily Felodipine 19m8maily Fluoxetine 63m87mily Furosemide 63mg26mly Insulin 25 units at night Linagliptin 19mg d19my Rosuvastatin 10mg d4m Sevelamer 800mg BI67mdium bicarb 650mg BID29mien 19mg prn Q29m Physical Exam: Vitals:   11/06/18 0045 11/06/18 0115 11/06/18 0145 11/06/18 0215  BP:  (!) 162/79 (!) 158/76 (!) 153/74  Pulse: 86 85 88 86  Resp: (!) '21 17 15 ' (!) 21  Temp:      TempSrc:      SpO2: 98% 97% 97% 99%  Height:        Constitutional: NAD, calm, comfortable, mildly anxious.  Eyes: PERRL, lids and conjunctivae normal ENMT: MM are mildly dry Neck: normal, supple Respiratory: CTAB, no wheezing, breathing comfortably Cardiovascular: RR, + systolic murmur.  Pulses 2+ in DP and radial arteries bilaterally, no LE edem Abdomen: +BS, NT, ND Musculoskeletal: She has an AVF in the RUE which has a thrill and bruit, she has post op changes from a transmetatarsal amputation on the left foot.  She has no induration, no warmth and no drainage.  Skin: very small boils, closed, no drainage on the medial aspect of the left lower leg.  She has some chronic skin changes on the left lower leg.  Neurologic: Grossly intact, she is moving all extremities  easily, speech is normal.  Psychiatric: Alert and oriented, highly anxious about possible infection.   Labs on Admission: I have personally reviewed following labs and imaging studies  CBC: Recent Labs  Lab 11/05/18 2351  WBC 4.4  NEUTROABS 3.7  HGB 11.4*  HCT 36.1  MCV 92.6  PLT 160   Basi979etabolic Panel: Recent Labs  Lab 11/05/18 2351  NA 136  K 4.5  CL 99  CO2 24  GLUCOSE 262*  BUN 74*  CREATININE 3.59*  CALCIUM 9.8   GFR: CrCl cannot be calculated (Unknown ideal weight.). Liver Function Tests: Recent Labs  Lab 11/05/18 2351  AST 21  ALT 19  ALKPHOS 59  BILITOT  0.6  PROT 7.0  ALBUMIN 3.4*   No results for input(s): LIPASE, AMYLASE in the last 168 hours. No results for input(s): AMMONIA in the last 168 hours. Coagulation Profile: No results for input(s): INR, PROTIME in the last 168 hours. Cardiac Enzymes: No results for input(s): CKTOTAL, CKMB, CKMBINDEX, TROPONINI in the last 168 hours. BNP (last 3 results) No results for input(s): PROBNP in the last 8760 hours. HbA1C: No results for input(s): HGBA1C in the last 72 hours. CBG: No results for input(s): GLUCAP in the last 168 hours. Lipid Profile: No results for input(s): CHOL, HDL, LDLCALC, TRIG, CHOLHDL, LDLDIRECT in the last 72 hours. Thyroid Function Tests: No results for input(s): TSH, T4TOTAL, FREET4, T3FREE, THYROIDAB in the last 72 hours. Anemia Panel: No results for input(s): VITAMINB12, FOLATE, FERRITIN, TIBC, IRON, RETICCTPCT in the last 72 hours.  Radiological Exams on Admission: Dg Chest Port 1 View  Result Date: 11/05/2018 CLINICAL DATA:  Fever. EXAM: PORTABLE CHEST 1 VIEW COMPARISON:  Chest x-ray dated January 28, 2015. FINDINGS: The heart size and mediastinal contours are within normal limits. Both lungs are clear. The visualized skeletal structures are unremarkable. IMPRESSION: No active disease. Electronically Signed   By: Titus Dubin M.D.   On: 11/05/2018 22:18   Dg Foot  Complete Left  Result Date: 11/06/2018 CLINICAL DATA:  Foot pain EXAM: LEFT FOOT - COMPLETE 3+ VIEW COMPARISON:  Numerous priors, most recent comparison 10/31/2015 radiograph. FINDINGS: Patient has undergone amputation of all 5 rays of level of the tarsometatarsal joints with some residual osseous fragments from the bases of the fourth fifth metatarsals seen along the lateral foot. There is extensive overlying soft tissue swelling and irregularity but without subcutaneous gas or unexpected foreign body evident. There is a questionable area of subcortical lucency involving the distal pole of the lateral cuneiform. Plantar calcaneal spur is noted. Vascular calcium noted in the soft tissues. IMPRESSION: Postoperative changes from prior trans metatarsal amputation of all 5 rays. Soft tissue swelling and irregularity along the anterior soft tissues. Small amount of subcortical lucency involving the lateral cuneiform could reflect early features of osteomyelitis. Electronically Signed   By: Lovena Le M.D.   On: 11/06/2018 00:00    EKG: Independently reviewed. NSR, wandering baseline, no ST changes  Assessment/Plan Fever Possible OM of the left foot - + fever, WBC normal, foot Xray with possible OM - MRI of the foot pending, no contrast - Started on Zosyn/Vanc --> given kidney disease will change to Cefepime/Vanc - Orthopedic consult, will see in the AM - [facility] pending - CXR without sign of infection, no dysuria, no cough - Other sources for infection would be AVF --> fistula is not warm/red/tender - Monitor for further fever - SARS-CoV pending - NPO tonight - ABI to check for PVD as a complicating factor - Check sed rate, CRP - oxycodone and dilaudid for pain if needed    Diabetes mellitus type 2, uncontrolled Peripheral neuropathy - She takes glargine insulin 25 units qhs, tradjenta - Check A1C - Decrease glargine to 10 units, hold tradjenta - SSI - Diabetes educator consult    Chronic  diastolic heart failure - Last TTE from 2011 in this system with grade 3 diastolic failure - No sign of acute issue today - Continue lasix   CKD (chronic kidney disease), stage V (HCC)  Anemia in chronic kidney disease - Cr is improved from previous - Continue sevelamer, sodium bicarb, lasix, calcitriol    HTN (hypertension)  - BP elevated,  continue home medications of felodipine, lasix, benazapril  - Increase or add medication if not well controlled  Depression/Anxiety - Continue fluoxetine       DVT prophylaxis: Lovenox  Code Status: Full  Disposition Plan: Admit for MRI and further work up of fever  Consults called: Orthopedics by ED, will see in the AM  Admission status: Tele, IP    Gilles Chiquito MD [facility]   If 7PM-7AM, please contact night-coverage [email or web address] Password [email or web address]  11/06/2018, 2:29 AM

## 2018-11-06 NOTE — ED Notes (Signed)
Crackers and water given to pt. Arby Barrette, RN approved.

## 2018-11-06 NOTE — ED Notes (Signed)
Only able to obtain one set of cultures by IV team before antibiotic administration. Will obtain the second soon

## 2018-11-06 NOTE — ED Notes (Signed)
Buriev MD at bedside.

## 2018-11-06 NOTE — Progress Notes (Signed)
Pharmacy Antibiotic Note  Holly [name] is a 53 y.o. female admitted on 11/05/2018 with L foot pain, possible osteomyelitis.  Pharmacy has been consulted for Vancomycin and Cefepime  dosing.  Plan: Vancomycin 1250 mg IV now, then Vancomycin 1 g IV q48h  Estimated AUC 506 Cefepime 1 g IV q24h  Height: 5\' 6"  (167.6 cm) IBW/kg (Calculated) : 59.3  Temp (24hrs), Avg:100.8 F (38.2 C), Min:100.7 F (38.2 C), Max:100.9 F (38.3 C)  Recent Labs  Lab 11/05/18 2251 11/05/18 2351  WBC  --  4.4  CREATININE  --  3.59*  LATICACIDVEN 1.2  --     CrCl cannot be calculated (Unknown ideal weight.).    Allergies  Allergen Reactions  . Bactrim Nausea And Vomiting  . Byetta 10 Mcg Pen [Exenatide] Nausea And Vomiting  . Hctz [Hydrochlorothiazide] Other (See Comments) and Itching    dizziness dizziness  . Spironolactone Other (See Comments)    Dizziness   . Sulfa Antibiotics Nausea And Vomiting  . Sulfacetamide Sodium Nausea And Vomiting  . Sulfamethoxazole-Trimethoprim Nausea And Vomiting     Caryl Pina 11/06/2018 2:26 AM

## 2018-11-07 ENCOUNTER — Encounter (HOSPITAL_COMMUNITY): Payer: Commercial Managed Care - PPO

## 2018-11-07 DIAGNOSIS — Z89432 Acquired absence of left foot: Secondary | ICD-10-CM | POA: Diagnosis not present

## 2018-11-07 DIAGNOSIS — G63 Polyneuropathy in diseases classified elsewhere: Secondary | ICD-10-CM | POA: Diagnosis not present

## 2018-11-07 DIAGNOSIS — R509 Fever, unspecified: Secondary | ICD-10-CM

## 2018-11-07 DIAGNOSIS — E1165 Type 2 diabetes mellitus with hyperglycemia: Secondary | ICD-10-CM

## 2018-11-07 DIAGNOSIS — D631 Anemia in chronic kidney disease: Secondary | ICD-10-CM

## 2018-11-07 DIAGNOSIS — N184 Chronic kidney disease, stage 4 (severe): Secondary | ICD-10-CM

## 2018-11-07 DIAGNOSIS — N185 Chronic kidney disease, stage 5: Secondary | ICD-10-CM | POA: Diagnosis not present

## 2018-11-07 LAB — GLUCOSE, CAPILLARY
Glucose-Capillary: 66 mg/dL — ABNORMAL LOW (ref 70–99)
Glucose-Capillary: 113 mg/dL — ABNORMAL HIGH (ref 70–99)
Glucose-Capillary: 149 mg/dL — ABNORMAL HIGH (ref 70–99)
Glucose-Capillary: 108 mg/dL — ABNORMAL HIGH (ref 70–99)
Glucose-Capillary: 199 mg/dL — ABNORMAL HIGH (ref 70–99)

## 2018-11-07 LAB — URINE CULTURE

## 2018-11-07 LAB — PROCALCITONIN: Procalcitonin: 1.01 ng/mL

## 2018-11-07 MED ORDER — GLUCERNA SHAKE PO LIQD: 237.0000 mL | Freq: Three times a day (TID) | ORAL | Status: DC

## 2018-11-07 MED ORDER — ADULT MULTIVITAMIN W/MINERALS CH
1.0000 | ORAL_TABLET | Freq: Every day | ORAL | Status: DC
  Administered 2018-11-07 – 2018-11-08 (×2): 1 via ORAL
  Filled 2018-11-07: qty 1

## 2018-11-07 NOTE — Progress Notes (Signed)
Inpatient Diabetes Program Recommendations  AACE/ADA: New Consensus Statement on Inpatient Glycemic Control (2015)  Target Ranges:  Prepandial:   less than 140 mg/dL      Peak postprandial:   less than 180 mg/dL (1-2 hours)      Critically ill patients:  140 - 180 mg/dL   Lab Results  Component Value Date   GLUCAP 149 (H) 11/07/2018   HGBA1C 8.4 (H) 11/06/2018    Review of Glycemic Control Results for PALLAVI, KUZIO (MRN [id number]) as of 11/07/2018 14:47  Ref. Range 11/06/2018 16:51 11/06/2018 22:12 11/07/2018 05:54 11/07/2018 06:46 11/07/2018 11:47  Glucose-Capillary Latest Ref Range: 70 - 99 mg/dL 123 (H) 85 66 (L) 113 (H) 149 (H)   Diabetes history: DM 2  Outpatient Diabetes medications: Toujeo 14-16 units daily, Tradjenta 5 mg daily Current orders for Inpatient glycemic control:  Novolog moderate tid with meals   Inpatient Diabetes Program Recommendations:    Note that patient had low blood sugars with Lantus 10 units daily. Basal insulin stopped.  Attempted to discuss DM with patient by phone.  She states that she does not always take the full dose of Toujeo due to concerns of going low.  We discussed treatment of low blood sugars and patient states "she knows what to do".  May need reduced dose of Toujeo at d/c and will need to f/u with PCP.  Patient states that RN in the room and she needs to get off the phone.  Will follow.   Thanks,  Adah Perl, RN, BC-ADM Inpatient Diabetes Coordinator Pager [phone] (8a-5p)

## 2018-11-07 NOTE — Progress Notes (Signed)
Initial Nutrition Assessment  DOCUMENTATION CODES:   Not applicable  INTERVENTION:   -MVI with minerals daily -Glucerna Shake po TID, each supplement provides 220 kcal and 10 grams of protein  NUTRITION DIAGNOSIS:   Inadequate oral intake related to poor appetite as evidenced by meal completion < 25%.  GOAL:   Patient will meet greater than or equal to 90% of their needs  MONITOR:   PO intake, Supplement acceptance, Weight trends, Skin, I & O's  REASON FOR ASSESSMENT:   Consult Wound healing  ASSESSMENT:   Holly [name] is a 53 y.o. female with medical history significant of HTN, DM2, IBS, CKD, HLD, neuropathy, anxiety who presents for 1 day of fever up to 101.5 at home.  She notes that she has been having pain in the left foot which shoots up to her knee.  She has a history of amputation on that foot.  She is very anxious about the possibility of having another infection and surgery.  She notes that she has had no sick contacts.  She had 2 boils on that leg, but they have been healing are mostly gone now.  She has no other symptoms and denies chest pain, cough, chills, headache, nausea, vomiting, diarrhea, abdominal pain,change in skin, new wounds, dysuria.  She took tylenol for the fever, but his has not helped with the pain which is sharp in nature.  Pt admitted with lt foot pain.   Reviewed I/O's: -270 ml x 24 hours  UOP: 750 ml x 24 hours  Pt is on droplet precautions. In an effort to preserve PPE, RD did not enter pt room. RD unable to speak with pt at this time.   Per MD notes, pt s/p lt transmetatarsal amputation approximately 4 years ago.  Case discussed with RN, who reports pt is alert and oriented. Pt is very eager to go home and refused lunch today because of this. Per RN, pt's fevers have resolved and there are no surgical indications at this time.   Noted meal completion 0%.   Pt with distant history of weight loss. Unsure if pt has recently lost weight.    Per MD, likely discharge home tomorrow.   Lab Results  Component Value Date   HGBA1C 8.4 (H) 11/06/2018   PTA DM medications are 25 units insulin glargine q HS.   Labs reviewed: CBGS: 113-149 (inpatient orders for glycemic control are 0-15 units insulin aspart TID with meals).   Diet Order:   Diet Order            Diet regular Room service appropriate? Yes; Fluid consistency: Thin  Diet effective now              EDUCATION NEEDS:   No education needs have been identified at this time  Skin:  Skin Assessment: Reviewed RN Assessment  Last BM:  11/06/18  Height:   Ht Readings from Last 1 Encounters:  11/05/18 5\' 6"  (1.676 m)    Weight:   Wt Readings from Last 1 Encounters:  11/07/18 66.4 kg    Ideal Body Weight:  59.1 kg  BMI:  Body mass index is 23.63 kg/m.  Estimated Nutritional Needs:   Kcal:  1700-1900  Protein:  75-90 grams  Fluid:  > 1.7 L    Ajwa Kimberley A. Jimmye Norman, RD, LDN, [place] Registered Dietitian II Certified Diabetes Care and Education Specialist Pager: [phone] After hours Pager: [phone]

## 2018-11-07 NOTE — Progress Notes (Signed)
CSW acknowledges consult for assistance with medications at discharge.   CSW will continue to follow and consult with RNCM closer to discharge. CSW is not permitted to assist with medications due to out of scope of practice.   Domenic Schwab, MSW, [place] Worker [facility]  [phone]

## 2018-11-07 NOTE — Progress Notes (Signed)
PT Cancellation Note  Patient Details Name: Holly [name] MRN: [id number] DOB: 01-18-1966   Cancelled Treatment:    Reason Eval/Treat Not Completed: PT screened, no needs identified, will sign off; patient reports mobilizing at her baseline with foot pain resolved.  Feels no need for skilled PT at this time.  RN made aware.  Will sign off.    Reginia Naas 11/07/2018, 4:47 PM Magda Kiel, [place] [phone] 11/07/2018

## 2018-11-07 NOTE — Progress Notes (Signed)
PROGRESS NOTE    Holly [name]  [id number] DOB: February 22, 1965 DOA: 11/05/2018 PCP: [name], MD   Brief Narrative:  Holly [name] is a 53 y.o. female with medical history significant of HTN, DM2, IBS, CKD, HLD, neuropathy, anxiety who presented with 1 day of fever up to 101.5 at home.  This was associated with pain in the left foot which shoots up to her knee, she has history of amputation of that foot.  Also had 1 day of diarrhea having 4 episodes and some nausea and vomiting.    No other complaint or any sick contact.  No urinary complaints.    In the ED, she was noted to be hypertensive and her Tmax was 100.9.  Blood work revealed elevated glucose and Cr, but this was improved from her baseline.  She had a WBC of 4.4 with mild lymphopenia.  Xray of the foot showed signs which could indicate early osteomyelitis for this, orthopedic was consulted and she underwent MRI of the foot which ruled out any infection, abscess or osteomyelitis.  Orthopedics signed off.  She was started on broad-spectrum antibiotics which were de-escalate it to Rocephin on 11/06/2018.  Assessment & Plan:   Active Problems:   Diabetes mellitus type 2, uncontrolled (HCC)   Peripheral neuropathy (HCC)   Chronic diastolic heart failure (HCC)   Anemia in chronic kidney disease   HTN (hypertension)   CKD (chronic kidney disease), stage V (HCC)   Fever   History of transmetatarsal amputation of left foot (HCC)  Left foot pain: Resolved.  Osteomyelitis ruled out.  Ortho signed off.  Fever, unknown etiology: She had fever of 100.4 as well as 100.8 last night again.  No other symptoms.  No respiratory symptoms she had some GI symptoms 3 days ago which resolved after 1 day.  COVID-19 negative.  No leukocytosis.  Will check procalcitonin.  Monitor for another 24 hours.  She really wants to go home but she also wants to be safe so she is agreeable with this plan.  Type 2 diabetes mellitus: He is on Lantus 25 units  at home as well as Tradjenta.  She was started on 10 units of Lantus here.  She still has hypoglycemia.  Lantus discontinued.  Continue SSI.  Chronic diastolic heart failure: Stable.  No symptoms.  Monitor.  CKD stage V: Improving.  Continue to watch.  Essential hypertension: Blood pressure controlled.  Continue current regimen.  Depression/anxiety: Continue fluoxetine.  DVT prophylaxis: Lovenox Code Status: Full code Family Communication:  None present at bedside.  Plan of care discussed with patient in length and he verbalized understanding and agreed with it. Disposition Plan: Potential discharge tomorrow.  Consultants:   Orthopedics  Procedures:   None  Antimicrobials:   IV Rocephin   Subjective: Patient seen and examined.  She states that she feels much better.  Pain has resolved in the foot.  No other complaint.  Objective: Vitals:   11/07/18 0426 11/07/18 0559 11/07/18 0647 11/07/18 0857  BP: (!) 126/59   (!) 127/59  Pulse: 87   86  Resp: 20   20  Temp: 99.3 F (37.4 C) 100.2 F (37.9 C) 100.2 F (37.9 C) 99.3 F (37.4 C)  TempSrc: Oral Oral Oral Oral  SpO2: 97%   97%  Weight:      Height:        Intake/Output Summary (Last 24 hours) at 11/07/2018 1036 Last data filed at 11/07/2018 0800 Gross per 24 hour  Intake 720  ml  Output 750 ml  Net -30 ml   Filed Weights   11/06/18 1645 11/07/18 0115  Weight: 66.5 kg 66.4 kg    Examination:  General exam: Appears calm and comfortable  Respiratory system: Clear to auscultation. Respiratory effort normal. Cardiovascular system: S1 & S2 heard, RRR. No JVD, murmurs, rubs, gallops or clicks. No pedal edema. Gastrointestinal system: Abdomen is nondistended, soft and nontender. No organomegaly or masses felt. Normal bowel sounds heard. Central nervous system: Alert and oriented. No focal neurological deficits. Extremities: Symmetric 5 x 5 power.  Amputated left foot. Skin: No rashes, lesions or ulcers  Psychiatry: Judgement and insight appear normal. Mood & affect appropriate.    Data Reviewed: I have personally reviewed following labs and imaging studies  CBC: Recent Labs  Lab 11/05/18 2351 11/06/18 0328  WBC 4.4 4.2  NEUTROABS 3.7  --   HGB 11.4* 11.4*  HCT 36.1 35.6*  MCV 92.6 93.0  PLT 160 [id number]*   Basic Metabolic Panel: Recent Labs  Lab 11/05/18 2351 11/06/18 0328  NA 136 138  K 4.5 4.3  CL 99 103  CO2 24 21*  GLUCOSE 262* 189*  BUN 74* 72*  CREATININE 3.59* 3.29*  CALCIUM 9.8 9.3   GFR: Estimated Creatinine Clearance: 18.1 mL/min (A) (by C-G formula based on SCr of 3.29 mg/dL (H)). Liver Function Tests: Recent Labs  Lab 11/05/18 2351  AST 21  ALT 19  ALKPHOS 59  BILITOT 0.6  PROT 7.0  ALBUMIN 3.4*   No results for input(s): LIPASE, AMYLASE in the last 168 hours. No results for input(s): AMMONIA in the last 168 hours. Coagulation Profile: Recent Labs  Lab 11/06/18 0431  INR 1.4*   Cardiac Enzymes: No results for input(s): CKTOTAL, CKMB, CKMBINDEX, TROPONINI in the last 168 hours. BNP (last 3 results) No results for input(s): PROBNP in the last 8760 hours. HbA1C: Recent Labs    11/06/18 0328  HGBA1C 8.4*   CBG: Recent Labs  Lab 11/06/18 1250 11/06/18 1651 11/06/18 2212 11/07/18 0554 11/07/18 0646  GLUCAP 90 123* 85 66* 113*   Lipid Profile: No results for input(s): CHOL, HDL, LDLCALC, TRIG, CHOLHDL, LDLDIRECT in the last 72 hours. Thyroid Function Tests: No results for input(s): TSH, T4TOTAL, FREET4, T3FREE, THYROIDAB in the last 72 hours. Anemia Panel: No results for input(s): VITAMINB12, FOLATE, FERRITIN, TIBC, IRON, RETICCTPCT in the last 72 hours. Sepsis Labs: Recent Labs  Lab 11/05/18 2251 11/06/18 0328  LATICACIDVEN 1.2 1.0    Recent Results (from the past 240 hour(s))  Blood culture (routine x 2)     Status: None (Preliminary result)   Collection Time: 11/05/18 11:06 PM   Specimen: BLOOD RIGHT HAND  Result Value Ref  Range Status   Specimen Description BLOOD RIGHT HAND  Final   Special Requests   Final    BOTTLES DRAWN AEROBIC AND ANAEROBIC Blood Culture results may not be optimal due to an inadequate volume of blood received in culture bottles   Culture   Final    NO GROWTH 2 DAYS Performed at [facility], [place] [address], [place], [place] [address]    Report Status PENDING  Incomplete  SARS CORONAVIRUS 2 (TAT 6-24 HRS) Nasopharyngeal Nasopharyngeal Swab     Status: None   Collection Time: 11/05/18 11:18 PM   Specimen: Nasopharyngeal Swab  Result Value Ref Range Status   SARS Coronavirus 2 NEGATIVE NEGATIVE Final    Comment: (NOTE) SARS-CoV-2 target nucleic acids are NOT DETECTED. The SARS-CoV-2  RNA is generally detectable in upper and lower respiratory specimens during the acute phase of infection. Negative results do not preclude SARS-CoV-2 infection, do not rule out co-infections with other pathogens, and should not be used as the sole basis for treatment or other patient management decisions. Negative results must be combined with clinical observations, patient history, and epidemiological information. The expected result is Negative. Fact Sheet for Patients: SugarRoll.be Fact Sheet for Healthcare Providers: https://www.woods-mathews.com/ This test is not yet approved or cleared by the [place] FDA and  has been authorized for detection and/or diagnosis of SARS-CoV-2 by FDA under an Emergency Use Authorization (EUA). This EUA will remain  in effect (meaning this test can be used) for the duration of the COVID-19 declaration under Section 56 4(b)(1) of the Act, 21 U.S.C. section 360bbb-3(b)(1), unless the authorization is terminated or revoked sooner. Performed at [facility], [place] [address], [place], [place] [address]   Blood Cultures x 2 sites     Status: None (Preliminary result)   Collection Time: 11/06/18  3:28 AM    Specimen: BLOOD RIGHT HAND  Result Value Ref Range Status   Specimen Description BLOOD RIGHT HAND  Final   Special Requests   Final    BOTTLES DRAWN AEROBIC AND ANAEROBIC Blood Culture adequate volume   Culture   Final    NO GROWTH 1 DAY Performed at [facility], [place] [address] [place], [place] [address]    Report Status PENDING  Incomplete  Urine culture     Status: None   Collection Time: 11/06/18 10:26 AM   Specimen: In/Out Cath Urine  Result Value Ref Range Status   Specimen Description IN/OUT CATH URINE  Final   Special Requests   Final    NONE Performed at [facility], [place] [address], [place], [place] [address]    Culture   Final    Multiple bacterial morphotypes present, none predominant. Suggest appropriate recollection if clinically indicated.   Report Status 11/07/2018 FINAL  Final  MRSA PCR Screening     Status: None   Collection Time: 11/06/18  8:33 PM   Specimen: Nasopharyngeal  Result Value Ref Range Status   MRSA by PCR NEGATIVE NEGATIVE Final    Comment:        The GeneXpert MRSA Assay (FDA approved for NASAL specimens only), is one component of a comprehensive MRSA colonization surveillance program. It is not intended to diagnose MRSA infection nor to guide or monitor treatment for MRSA infections. Performed at [facility], [place] [address], [place], [place] [address]       Radiology Studies: Mr Foot Left Wo Contrast  Result Date: 11/06/2018 CLINICAL DATA:  Diabetic patient with left foot pain and swelling. History of prior amputation. EXAM: MRI OF THE LEFT FOOT WITHOUT CONTRAST TECHNIQUE: Multiplanar, multisequence MR imaging of the left foot was performed. No intravenous contrast was administered. COMPARISON:  Plain films left foot 11/05/2018. MRI left foot 10/31/2015. FINDINGS: Bones/Joint/Cartilage The patient is status post amputation at the level of the tarsometatarsal joints as seen on the prior exams. There is no marrow edema  to suggest osteomyelitis. No fracture, stress change or worrisome lesion. Ligaments Intact. Muscles and Tendons Intact.  No evidence of tenosynovitis. Soft tissues Negative for abscess.  No mass.  No soft tissue edema. IMPRESSION: No acute abnormality. Negative for abscess, osteomyelitis or septic joint. Electronically Signed   By: Inge Rise M.D.   On: 11/06/2018 09:17  Dg Chest Port 1 View  Result Date: 11/05/2018 CLINICAL DATA:  Fever. EXAM: PORTABLE CHEST 1 VIEW COMPARISON:  Chest x-ray dated January 28, 2015. FINDINGS: The heart size and mediastinal contours are within normal limits. Both lungs are clear. The visualized skeletal structures are unremarkable. IMPRESSION: No active disease. Electronically Signed   By: Titus Dubin M.D.   On: 11/05/2018 22:18   Dg Foot Complete Left  Result Date: 11/06/2018 CLINICAL DATA:  Foot pain EXAM: LEFT FOOT - COMPLETE 3+ VIEW COMPARISON:  Numerous priors, most recent comparison 10/31/2015 radiograph. FINDINGS: Patient has undergone amputation of all 5 rays of level of the tarsometatarsal joints with some residual osseous fragments from the bases of the fourth fifth metatarsals seen along the lateral foot. There is extensive overlying soft tissue swelling and irregularity but without subcutaneous gas or unexpected foreign body evident. There is a questionable area of subcortical lucency involving the distal pole of the lateral cuneiform. Plantar calcaneal spur is noted. Vascular calcium noted in the soft tissues. IMPRESSION: Postoperative changes from prior trans metatarsal amputation of all 5 rays. Soft tissue swelling and irregularity along the anterior soft tissues. Small amount of subcortical lucency involving the lateral cuneiform could reflect early features of osteomyelitis. Electronically Signed   By: Lovena Le M.D.   On: 11/06/2018 00:00    Scheduled Meds: . benazepril  40 mg Oral Daily  . calcitRIOL  0.25 mcg Oral QODAY  . enoxaparin  (LOVENOX) injection  30 mg Subcutaneous Daily  . felodipine  5 mg Oral QHS  . FLUoxetine  40 mg Oral q morning - 10a  . furosemide  40 mg Oral Daily  . insulin aspart  0-15 Units Subcutaneous TID WC  . rosuvastatin  10 mg Oral q1800  . sevelamer carbonate  800 mg Oral BID WC  . sodium bicarbonate  1,300 mg Oral BID  . sodium chloride flush  3 mL Intravenous Q12H   Continuous Infusions: . cefTRIAXone (ROCEPHIN)  IV 1 g (11/06/18 2141)     LOS: 1 day   Time spent: 32 minutes.   Darliss Cheney, MD [facility]  11/07/2018, 10:36 AM   To contact the attending provider between 7A-7P or the covering provider during after hours 7P-7A, please log into the web site [email or web address] and use password [email or web address].

## 2018-11-07 NOTE — TOC Transition Note (Signed)
Transition of Care [facility]) - CM/SW Discharge Note   Patient Details  Name: Holly [name] MRN: [id number] Date of Birth: 1965/09/19  Transition of [facility]) CM/SW Contact:  Zenon Mayo, RN Phone Number: 11/07/2018, 4:32 PM   Clinical Narrative:    Patient is indep, has insurance, does not need help with medications, she has no needs.   Final next level of care: Home/Self Care Barriers to Discharge: No Barriers Identified   Patient Goals and CMS Choice Patient states their goals for this hospitalization and ongoing recovery are:: go home   Choice offered to / list presented to : NA  Discharge Placement                       Discharge Plan and Services                DME Arranged: (NA)         HH Arranged: NA          Social Determinants of Health (SDOH) Interventions     Readmission Risk Interventions No flowsheet data found.

## 2018-11-07 NOTE — Consult Note (Signed)
ORTHOPAEDIC CONSULTATION  REQUESTING PHYSICIAN: Darliss Cheney, MD  Chief Complaint: Patient presents with fever and chills concern for possible osteomyelitis left transmetatarsal amputation.  HPI: Holly [name] is a 53 y.o. female who presents with fever and chills she is status post left transmetatarsal amputation 4 years ago.  Patient denies any pains or problems with her foot.  Patient does have type 2 diabetes.  Past Medical History:  Diagnosis Date   Acute cystitis    Anxiety state, unspecified    Background diabetic retinopathy(362.01)    Bulimia    Calculus of kidney    Cellulitis and abscess of foot 01/24/2015   left foot   Chronic inflammatory demyelinating polyneuritis (HCC)    Chronic kidney disease, stage IV (severe) (HCC)    Complication of anesthesia    slow to wake up, had lost a lot of blood - 2010   Diarrhea    symptomatic   Disorders of magnesium metabolism    DM2 (diabetes mellitus, type 2) (HCC)    Dysthymic disorder    Dysuria    Edema    moderate   Encounter for long-term (current) use of other medications    Esophageal reflux    Essential hypertension, benign    Heart murmur    been told very slight, never given her any problems   History of blood transfusion X 6-7; 2010 - present (03/29/2014)   "related to OR; kidney issues"   HLD (hyperlipidemia)    HDL goal >50, LDL goal <100   Iron deficiency anemia    "suppose to get procrit injections q 3 wks; usually don't" do it" (03/29/2014)   Irritable bowel syndrome    Left heart failure (HCC)    Muscle weakness (generalized)    Neuralgia, neuritis, and radiculitis, unspecified    Palpitations    Peripheral autonomic neuropathy in disorders classified elsewhere(337.1)    Pneumonia 2010   Primary pulmonary hypertension ([place])    not seen at CATH  (60m Hg), pt not aware of this   Type II or unspecified type diabetes mellitus with other specified manifestations, not  stated as uncontrolled    [facility] Normal coronary arteries   Unspecified essential hypertension    [facility] No significant renal artery stenosis bilaterally   Past Surgical History:  Procedure Laterality Date   AMPUTATION Left 01/26/2015   Procedure: POST TRANS MET ;  Surgeon: MNewt Minion MD;  [place]  Service: [place]ics;  Laterality: Left;   APPENDECTOMY     AV FISTULA PLACEMENT Right 01/08/2015   Procedure: ARTERIOVENOUS (AV) FISTULA CREATION;  Surgeon: CAngelia Mould MD;  [facility]  Service: Vascular;  Laterality: Right;   [place] SE[place]  2001; 1999; [place] Right 2010   "serious staph infection"   PARS PLANA VITRECTOMY Bilateral 2010   "related to DM"   TONSILLECTOMY AND ADENOIDECTOMY     Social History   Socioeconomic History   Marital status: Single    Spouse name: Not on file   Number of children: 4   Years of education: Not on file   Highest education level: Not on file  Occupational History   Occupation: collections  Social Needs   Financial resource strain: Not on file   Food insecurity  Worry: Not on file    Inability: Not on file   Transportation needs    Medical: Not on file    Non-medical: Not on file  Tobacco Use   Smoking status: Never Smoker   Smokeless tobacco: Never Used  Substance and Sexual Activity   Alcohol use: Yes    Comment: 03/29/2014 "might have a drink a couple times/yr"   Drug use: No   Sexual activity: Yes  Lifestyle   Physical activity    Days per week: Not on file    Minutes per session: Not on file   Stress: Not on file  Relationships   Social connections    Talks on phone: Not on file    Gets together: Not on file    Attends religious service: Not on file    Active member of  club or organization: Not on file    Attends meetings of clubs or organizations: Not on file    Relationship status: Not on file  Other Topics Concern   Not on file  Social History Narrative   Not on file   Family History  Problem Relation Age of Onset   Hypertension Mother    Myelodysplastic syndrome Father    Pancreatic cancer Other    - negative except otherwise stated in the family history section Allergies  Allergen Reactions   Bactrim Nausea And Vomiting   [place] 10 Mcg Pen [Exenatide] Nausea And Vomiting   Hctz [Hydrochlorothiazide] Other (See Comments) and Itching    dizziness dizziness   Spironolactone Other (See Comments)    Dizziness    Sulfa Antibiotics Nausea And Vomiting   Sulfacetamide Sodium Nausea And Vomiting   Sulfamethoxazole-Trimethoprim Nausea And Vomiting   Prior to Admission medications   Medication Sig Start Date End Date Taking? Authorizing Provider  benazepril (LOTENSIN) 40 MG tablet Take 40 mg by mouth at bedtime. 08/22/18  Yes [provider]  calcitRIOL (ROCALTROL) 0.25 MCG capsule Take 0.25 mcg by mouth daily.    Yes [provider]  calcium acetate (PHOSLO) 667 MG capsule Take 667 mg by mouth 2 (two) times daily.   Yes [provider]  felodipine (PLENDIL) 5 MG 24 hr tablet Take 5 mg by mouth at bedtime. 12/16/14  Yes [provider]  FLUoxetine (PROZAC) 40 MG capsule Take 40 mg by mouth every morning.    Yes [provider]  furosemide (LASIX) 20 MG tablet Take 20 mg by mouth 2 (two) times daily. 01/01/13  Yes [provider]  Insulin Glargine, 1 Unit Dial, 300 UNIT/ML SOPN Inject 14-16 Units into the skin daily. 06/09/16  Yes [provider]  linagliptin (TRADJENTA) 5 MG TABS tablet Take 5 mg by mouth daily.   Yes [provider]  sodium bicarbonate 650 MG tablet Take 1,300 mg by mouth 2 (two) times daily.   Yes [provider]  zolpidem (AMBIEN) 5 MG  tablet Take 5 mg by mouth at bedtime. 10/18/18  Yes [provider]  glucose blood (ONETOUCH VERIO) test strip 1 each by Other route 2 (two) times daily. And lancets 2/day 250/.61 05/25/12   Renato Shin, MD  Nutritional Supplements (FEEDING SUPPLEMENT, NEPRO CARB STEADY,) LIQD Take 237 mLs by mouth 2 (two) times daily between meals. Patient not taking: Reported on 11/06/2018 02/03/15   Orson Eva, MD  oxyCODONE-acetaminophen (ROXICET) 5-325 MG tablet Take 1 tablet by mouth every 6 (six) hours as needed. Patient not taking: Reported on 11/06/2018 02/03/15   Orson Eva,  MD   Mr Foot Left Wo Contrast  Result Date: 11/06/2018 CLINICAL DATA:  Diabetic patient with left foot pain and swelling. History of prior amputation. EXAM: MRI OF THE LEFT FOOT WITHOUT CONTRAST TECHNIQUE: Multiplanar, multisequence MR imaging of the left foot was performed. No intravenous contrast was administered. COMPARISON:  Plain films left foot 11/05/2018. MRI left foot 10/31/2015. FINDINGS: Bones/Joint/Cartilage The patient is status post amputation at the level of the tarsometatarsal joints as seen on the prior exams. There is no marrow edema to suggest osteomyelitis. No fracture, stress change or worrisome lesion. Ligaments Intact. Muscles and Tendons Intact.  No evidence of tenosynovitis. Soft tissues Negative for abscess.  No mass.  No soft tissue edema. IMPRESSION: No acute abnormality. Negative for abscess, osteomyelitis or septic joint. Electronically Signed   By: Inge Rise M.D.   On: 11/06/2018 09:17   Dg Chest Port 1 View  Result Date: 11/05/2018 CLINICAL DATA:  Fever. EXAM: PORTABLE CHEST 1 VIEW COMPARISON:  Chest x-ray dated January 28, 2015. FINDINGS: The heart size and mediastinal contours are within normal limits. Both lungs are clear. The visualized skeletal structures are unremarkable. IMPRESSION: No active disease. Electronically Signed   By: Titus Dubin M.D.   On: 11/05/2018 22:18   Dg Foot  Complete Left  Result Date: 11/06/2018 CLINICAL DATA:  Foot pain EXAM: LEFT FOOT - COMPLETE 3+ VIEW COMPARISON:  Numerous priors, most recent comparison 10/31/2015 radiograph. FINDINGS: Patient has undergone amputation of all 5 rays of level of the tarsometatarsal joints with some residual osseous fragments from the bases of the fourth fifth metatarsals seen along the lateral foot. There is extensive overlying soft tissue swelling and irregularity but without subcutaneous gas or unexpected foreign body evident. There is a questionable area of subcortical lucency involving the distal pole of the lateral cuneiform. Plantar calcaneal spur is noted. Vascular calcium noted in the soft tissues. IMPRESSION: Postoperative changes from prior trans metatarsal amputation of all 5 rays. Soft tissue swelling and irregularity along the anterior soft tissues. Small amount of subcortical lucency involving the lateral cuneiform could reflect early features of osteomyelitis. Electronically Signed   By: Lovena Le M.D.   On: 11/06/2018 00:00   - pertinent xrays, CT, MRI studies were reviewed and independently interpreted  Positive ROS: All other systems have been reviewed and were otherwise negative with the exception of those mentioned in the HPI and as above.  Physical Exam: General: Alert, no acute distress Psychiatric: Patient is competent for consent with normal mood and affect Lymphatic: No axillary or cervical lymphadenopathy Cardiovascular: No pedal edema Respiratory: No cyanosis, no use of accessory musculature GI: No organomegaly, abdomen is soft and non-tender    Images:  _0 @  Labs:  Lab Results  Component Value Date   HGBA1C 8.4 (H) 11/06/2018   HGBA1C 7.6 (H) 01/24/2015   HGBA1C 11.4 (H) 03/29/2014   ESRSEDRATE 59 (H) 11/06/2018   ESRSEDRATE 135 (H) 01/28/2015   ESRSEDRATE 65 (H) 03/27/2008   CRP 1.8 (H) 11/06/2018   CRP 30.5 (H) 01/28/2015   REPTSTATUS PENDING 11/05/2018    GRAMSTAIN  01/26/2015    FEW WBC PRESENT,BOTH PMN AND MONONUCLEAR NO SQUAMOUS EPITHELIAL CELLS SEEN ABUNDANT GRAM POSITIVE COCCI IN PAIRS Performed at [place]  01/26/2015    NO WBC SEEN NO SQUAMOUS EPITHELIAL CELLS SEEN RARE GRAM POSITIVE COCCI IN PAIRS Performed at [place]  11/05/2018    NO GROWTH < 24 HOURS  Performed at [facility], [place] [address] [place], [place] [address]    LABORGA METHICILLIN RESISTANT STAPHYLOCOCCUS AUREUS 01/24/2015    Lab Results  Component Value Date   ALBUMIN 3.4 (L) 11/05/2018   ALBUMIN 2.0 (L) 02/03/2015   ALBUMIN 1.9 (L) 02/02/2015   PREALBUMIN 21.4 11/06/2018    Neurologic: Patient does not have protective sensation bilateral lower extremities.   MUSCULOSKELETAL:   Skin: Examination patient has no redness cellulitis or swelling in the left foot she does have venous insufficiency with some brawny skin color changes across the ankle.  Patient has a palpable dorsalis pedis pulse there are no ulcers no cellulitis no fluctuance no clinical signs of infection.  Her radiographs and MRI scan were reviewed which shows no evidence of osteomyelitis or abscess.  Assessment: Assessment: Diabetic insensate neuropathy with a stable left transmetatarsal amputation.  Plan: I will follow-up as needed for her foot no surgical intervention necessary at this time.  No indication for antibiotics for the foot.  Thank you for the consult and the opportunity to see Holly [name], [place] [phone] 7:39 AM

## 2018-11-08 ENCOUNTER — Encounter (HOSPITAL_COMMUNITY): Payer: Self-pay

## 2018-11-08 ENCOUNTER — Encounter (HOSPITAL_COMMUNITY): Payer: Commercial Managed Care - PPO

## 2018-11-08 DIAGNOSIS — N184 Chronic kidney disease, stage 4 (severe): Secondary | ICD-10-CM | POA: Diagnosis not present

## 2018-11-08 DIAGNOSIS — D72819 Decreased white blood cell count, unspecified: Secondary | ICD-10-CM | POA: Diagnosis present

## 2018-11-08 DIAGNOSIS — R509 Fever, unspecified: Secondary | ICD-10-CM | POA: Diagnosis not present

## 2018-11-08 DIAGNOSIS — D631 Anemia in chronic kidney disease: Secondary | ICD-10-CM | POA: Diagnosis not present

## 2018-11-08 LAB — CBC WITH DIFFERENTIAL/PLATELET
Abs Immature Granulocytes: 0.02 10*3/uL (ref 0.00–0.07)
Abs Immature Granulocytes: 0.02 10*3/uL (ref 0.00–0.07)
Basophils Absolute: 0 10*3/uL (ref 0.0–0.1)
Basophils Absolute: 0 10*3/uL (ref 0.0–0.1)
Basophils Relative: 0 %
Basophils Relative: 1 %
Eosinophils Absolute: 0 10*3/uL (ref 0.0–0.5)
Eosinophils Absolute: 0 10*3/uL (ref 0.0–0.5)
Eosinophils Relative: 1 %
Eosinophils Relative: 1 %
HCT: 30.9 % — ABNORMAL LOW (ref 36.0–46.0)
HCT: 31.2 % — ABNORMAL LOW (ref 36.0–46.0)
Hemoglobin: 10.1 g/dL — ABNORMAL LOW (ref 12.0–15.0)
Hemoglobin: 10.2 g/dL — ABNORMAL LOW (ref 12.0–15.0)
Immature Granulocytes: 1 %
Immature Granulocytes: 1 %
Lymphocytes Relative: 17 %
Lymphocytes Relative: 19 %
Lymphs Abs: 0.4 10*3/uL — ABNORMAL LOW (ref 0.7–4.0)
Lymphs Abs: 0.6 10*3/uL — ABNORMAL LOW (ref 0.7–4.0)
MCH: 29.2 pg (ref 26.0–34.0)
MCH: 29.5 pg (ref 26.0–34.0)
MCHC: 32.7 g/dL (ref 30.0–36.0)
MCHC: 32.7 g/dL (ref 30.0–36.0)
MCV: 89.3 fL (ref 80.0–100.0)
MCV: 90.2 fL (ref 80.0–100.0)
Monocytes Absolute: 0.4 10*3/uL (ref 0.1–1.0)
Monocytes Absolute: 0.4 10*3/uL (ref 0.1–1.0)
Monocytes Relative: 16 %
Monocytes Relative: 13 %
Neutro Abs: 1.6 10*3/uL — ABNORMAL LOW (ref 1.7–7.7)
Neutro Abs: 1.9 10*3/uL (ref 1.7–7.7)
Neutrophils Relative %: 65 %
Neutrophils Relative %: 65 %
Platelets: 78 10*3/uL — ABNORMAL LOW (ref 150–400)
Platelets: 84 10*3/uL — ABNORMAL LOW (ref 150–400)
RBC: 3.46 MIL/uL — ABNORMAL LOW (ref 3.87–5.11)
RBC: 3.46 MIL/uL — ABNORMAL LOW (ref 3.87–5.11)
RDW: 13.3 % (ref 11.5–15.5)
RDW: 13.5 % (ref 11.5–15.5)
WBC Morphology: INCREASED
WBC: 2.5 10*3/uL — ABNORMAL LOW (ref 4.0–10.5)
WBC: 2.9 10*3/uL — ABNORMAL LOW (ref 4.0–10.5)
nRBC: 0 % (ref 0.0–0.2)
nRBC: 0 % (ref 0.0–0.2)

## 2018-11-08 LAB — COMPREHENSIVE METABOLIC PANEL
ALT: 43 U/L (ref 0–44)
AST: 50 U/L — ABNORMAL HIGH (ref 15–41)
Albumin: 2.7 g/dL — ABNORMAL LOW (ref 3.5–5.0)
Alkaline Phosphatase: 88 U/L (ref 38–126)
Anion gap: 13 (ref 5–15)
BUN: 72 mg/dL — ABNORMAL HIGH (ref 6–20)
CO2: 23 mmol/L (ref 22–32)
Calcium: 9 mg/dL (ref 8.9–10.3)
Chloride: 98 mmol/L (ref 98–111)
Creatinine, Ser: 3.47 mg/dL — ABNORMAL HIGH (ref 0.44–1.00)
GFR calc Af Amer: 16 mL/min — ABNORMAL LOW (ref >60)
GFR calc non Af Amer: 14 mL/min — ABNORMAL LOW (ref >60)
Glucose, Bld: 291 mg/dL — ABNORMAL HIGH (ref 70–99)
Potassium: 4 mmol/L (ref 3.5–5.1)
Sodium: 134 mmol/L — ABNORMAL LOW (ref 135–145)
Total Bilirubin: 0.5 mg/dL (ref 0.3–1.2)
Total Protein: 6 g/dL — ABNORMAL LOW (ref 6.5–8.1)

## 2018-11-08 LAB — GLUCOSE, CAPILLARY: Glucose-Capillary: 275 mg/dL — ABNORMAL HIGH (ref 70–99)

## 2018-11-08 NOTE — Discharge Instructions (Signed)
Fever, Adult     A fever is an increase in your body's temperature. It often means a temperature of 100.4F (38C) or higher. Brief mild or moderate fevers often have no long-term effects. They often do not need treatment. Moderate or high fevers may make you feel uncomfortable. Sometimes, they can be a sign of a serious illness or disease. A fever that keeps coming back or that lasts a long time may cause you to lose water in your body (get dehydrated). You can take your temperature with a thermometer to see if you have a fever. Temperature can change with:  Age.  Time of day.  Where the thermometer is put in the body. Readings may vary when the thermometer is put: ? In the mouth (oral). ? In the butt (rectal). ? In the ear (tympanic). ? Under the arm (axillary). ? On the forehead (temporal). Follow these instructions at home: Medicines  Take over-the-counter and prescription medicines only as told by your doctor. Follow the dosing instructions carefully.  If you were prescribed an antibiotic medicine, take it as told by your doctor. Do not stop taking it even if you start to feel better. General instructions  Watch for any changes in your symptoms. Tell your doctor about them.  Rest as needed.  Drink enough fluid to keep your pee (urine) pale yellow.  Sponge yourself or bathe with room-temperature water as needed. This helps to lower your body temperature. Do not use ice water.  Do not use too many blankets or wear clothes that are too heavy.  If your fever was caused by an infection that spreads from person to person (is contagious), such as a cold or the flu: ? You should stay home from work and public places for at least 24 hours after your fever is gone. ? Your fever should be gone for at least 24 hours without the need to use medicines. Contact a doctor if:  You throw up (vomit).  You cannot eat or drink without throwing up.  You have watery poop (diarrhea).  It  hurts when you pee.  Your symptoms do not get better with treatment.  You have new symptoms.  You feel very weak. Get help right away if:  You are short of breath or have trouble breathing.  You are dizzy or you pass out (faint).  You feel mixed up (confused).  You have signs of not having enough water in your body, such as: ? Dark pee, very little pee, or no pee. ? Cracked lips. ? Dry mouth. ? Sunken eyes. ? Sleepiness. ? Weakness.  You have very bad pain in your belly (abdomen).  You keep throwing up or having watery poop.  You have a rash on your skin.  Your symptoms get worse all of a sudden. Summary  A fever is an increase in your body's temperature. It often means a temperature of 100.4F (38C) or higher.  Watch for any changes in your symptoms. Tell your doctor about them.  Take all medicines only as told by your doctor.  Do not go to work or other public places if your fever was caused by an illness that can spread to other people.  Get help right away if you have signs that you do not have enough water in your body. This information is not intended to replace advice given to you by your health care provider. Make sure you discuss any questions you have with your health care provider. Document Released: 10/22/2007   Document Revised: 06/28/2017 Document Reviewed: 06/28/2017 Elsevier Patient Education  2020 Elsevier Inc.  

## 2018-11-08 NOTE — Progress Notes (Signed)
Patient has all her belongings on the chair and her cardigan on and said that she needs to go home because she has to work. MD notified, will continue to educate and monitor.

## 2018-11-08 NOTE — Discharge Summary (Signed)
Physician Discharge Summary  Holly [name] [id number] DOB: 02-May-1965 DOA: 11/05/2018  PCP: [name], MD  Admit date: 11/05/2018 Discharge date: 11/08/2018  Admitted From: Home Disposition: Home  Recommendations for Outpatient Follow-up:  1. Follow up with PCP in 1-2 weeks 2. Follow-up with Dr. Alvy Bimler at [facility] on 11/15/2026 11:15 AM. 3. Please obtain BMP/CBC in one week 4. Please follow up on the following pending results:  Home Health: None Equipment/Devices: None  Discharge Condition: Stable CODE STATUS: Code Diet recommendation: Cardiac  Subjective: Patient seen and examined.  She has no complaints.  She has remained afebrile for last 24 hours.  Brief/Interim Summary: Holly Cuartas Delzottois a 53 y.o.femalewith medical history significant ofHTN, DM2, IBS, CKD, HLD, neuropathy, anxiety who presented with 1 day of fever up to 101.5 at home.  This was associated with pain in the left foot which shoots up to her knee, she has history of amputation of that foot.  Also had 1 day of diarrhea having 4 episodes and some nausea and vomiting.   No other complaint or any sick contact.  No urinary complaints.    In the ED, she was noted to be hypertensive and her Tmax was 100.9. Blood work revealed elevated glucose and Cr, but this was improved from her baseline. She had a WBC of 4.4 with mild lymphopenia. Xray of the foot showed signs which could indicate early osteomyelitis for this, orthopedic was consulted and she underwent MRI of the foot which ruled out any infection, abscess or osteomyelitis.  Orthopedics signed off.  She was started on broad-spectrum antibiotics which were de-escalate it to Rocephin on 11/06/2018.  She has now remained afebrile for last 24 hours as of 11/08/2018 9:30 AM.  I received a message from her nurse about 30 minutes ago that patient is very eager to go home as she has to get back to work.  I came to see patient within 5 minutes.  Patient  developed leukopenia and thrombocytopenia with no signs of infection or bleeding.  I explained to the patient that I would like to further investigate this and for this reason, I recommend hematology consult as inpatient before she gets discharged.  She was initially adamant on going home and following up with hematology as an outpatient but after some talking, she was convinced to stay.  I did consult and talked to Dr. Alvy Bimler over the phone who reviewed patient's chart remotely and recommended outpatient follow-up with her.  She was kind enough to provide me 1 week follow-up with her at her office on next Tuesday on 11/15/2018 at 11:15 AM.  She did mention that a lot of times patients when they are hospitalized may develop leukopenia and thrombocytopenia due to antibiotics or bone marrow suppression due to fever.  I then talked to the patient again and offered this plan of outpatient follow-up.  She was happy to do so and she was happy to know that she is going to be discharged today.  I did explain to her that she needs to seek immediate medical attention if she develops fever or bleeding or any other signs of infection.  Since we were not able to find any source of infection, her fever could be due to gastroenteritis so we decided not to send her on home antibiotics.  She also agrees with that plan.  She will follow with PCP within 1 week as well.  Discharge Diagnoses:  Active Problems:   Diabetes mellitus type 2, uncontrolled ([place])  Peripheral neuropathy (HCC)   Chronic diastolic heart failure (HCC)   Anemia in chronic kidney disease   HTN (hypertension)   Thrombocytopenia (HCC)   CKD (chronic kidney disease), stage V (HCC)   Fever   History of transmetatarsal amputation of left foot (HCC)   Leukopenia    Discharge Instructions  Discharge Instructions    Discharge patient   Complete by: As directed    Discharge disposition: 01-Home or Self Care   Discharge patient date: 11/08/2018      Allergies as of 11/08/2018      Reactions   Bactrim Nausea And Vomiting   Byetta 10 Mcg Pen [exenatide] Nausea And Vomiting   Hctz [hydrochlorothiazide] Other (See Comments), [name]      Medication List    TAKE these medications   benazepril 40 MG tablet Commonly known as: LOTENSIN Take 40 mg by mouth at bedtime.   calcitRIOL 0.25 MCG capsule Commonly known as: ROCALTROL Take 0.25 mcg by mouth daily.   calcium acetate 667 MG capsule Commonly known as: PHOSLO Take 667 mg by mouth 2 (two) times daily.   feeding supplement (NEPRO CARB STEADY) Liqd Take 237 mLs by mouth 2 (two) times daily between meals.   felodipine 5 MG 24 hr tablet Commonly known as: PLENDIL Take 5 mg by mouth at bedtime.   FLUoxetine 40 MG capsule Commonly known as: PROZAC Take 40 mg by mouth every morning.   furosemide 20 MG tablet Commonly known as: LASIX Take 20 mg by mouth 2 (two) times daily.   glucose blood test strip Commonly known as: OneTouch Verio 1 each by Other route 2 (two) times daily. And lancets 2/day 250/.61   Insulin Glargine (1 Unit Dial) 300 UNIT/ML Sopn Inject 14-16 Units into the skin daily.   linagliptin 5 MG Tabs tablet Commonly known as: TRADJENTA Take 5 mg by mouth daily.   oxyCODONE-acetaminophen 5-325 MG tablet Commonly known as: Roxicet Take 1 tablet by mouth every 6 (six) hours as needed.   sodium bicarbonate 650 MG tablet Take 1,300 mg by mouth 2 (two) times daily.   zolpidem 5 MG tablet Commonly known as: AMBIEN Take 5 mg by mouth at bedtime.      Follow-up Information    Newt Minion, MD Follow up.   Specialty: Orthopedic Surgery Why: Follow-up as needed. Contact information: [place]  [address] [phone]        [name], MD Follow up in 1 week(s).   Specialty: Family Medicine Contact information: [address] [place] [address] [place] Follow up in 1 week(s).   Why: You have an appointment to see Dr. Alvy Bimler at 11:15 AM on 11/15/2018, Tuesday.  Please arrive 30 minutes before your appointment time.         Allergies  Allergen Reactions  . Bactrim Nausea And Vomiting  . Byetta 10 Mcg Pen [Exenatide] Nausea And Vomiting  . Hctz [Hydrochlorothiazide] Other (See Comments) and Itching    dizziness dizziness  . Spironolactone Other (See Comments)    Dizziness   . Sulfa Antibiotics Nausea And Vomiting  . Sulfacetamide Sodium Nausea And Vomiting  . Sulfamethoxazole-Trimethoprim Nausea And Vomiting    Consultations: Hematology/oncology over the phone  Procedures/Studies: Mr Foot Left Wo Contrast  Result Date: 11/06/2018 CLINICAL DATA:  Diabetic patient with left foot pain and swelling. History of prior amputation. EXAM: MRI OF THE LEFT FOOT WITHOUT CONTRAST TECHNIQUE: Multiplanar, multisequence MR imaging of the left foot was performed. No intravenous contrast was administered. COMPARISON:  Plain films left foot 11/05/2018. MRI left foot 10/31/2015. FINDINGS: Bones/Joint/Cartilage The patient is status post amputation at the level of the tarsometatarsal joints as seen on the prior exams. There is no marrow edema to suggest osteomyelitis. No fracture, stress change or worrisome lesion. Ligaments Intact. Muscles and Tendons Intact.  No evidence of tenosynovitis. Soft tissues Negative for abscess.  No mass.  No soft tissue edema. IMPRESSION: No acute abnormality. Negative for abscess, osteomyelitis or septic joint. Electronically Signed   By: Inge Rise M.D.   On: 11/06/2018 09:17   Dg Chest Port 1 View  Result Date: 11/05/2018 CLINICAL DATA:  Fever. EXAM: PORTABLE CHEST 1 VIEW COMPARISON:  Chest  x-ray dated January 28, 2015. FINDINGS: The heart size and mediastinal contours are within normal limits. Both lungs are clear. The visualized skeletal structures are unremarkable. IMPRESSION: No active disease. Electronically Signed   By: Titus Dubin M.D.   On: 11/05/2018 22:18   Dg Foot Complete Left  Result Date: 11/06/2018 CLINICAL DATA:  Foot pain EXAM: LEFT FOOT - COMPLETE 3+ VIEW COMPARISON:  Numerous priors, most recent comparison 10/31/2015 radiograph. FINDINGS: Patient has undergone amputation of all 5 rays of level of the tarsometatarsal joints with some residual osseous fragments from the bases of the fourth fifth metatarsals seen along the lateral foot. There is extensive overlying soft tissue swelling and irregularity but without subcutaneous gas or unexpected foreign body evident. There is a questionable area of subcortical lucency involving the distal pole of the lateral cuneiform. Plantar calcaneal spur is noted. Vascular calcium noted in the soft tissues. IMPRESSION: Postoperative changes from prior trans metatarsal amputation of all 5 rays. Soft tissue swelling and irregularity along the anterior soft tissues. Small amount of subcortical lucency involving the lateral cuneiform could reflect early features of osteomyelitis. Electronically Signed   By: Lovena Le M.D.   On: 11/06/2018 00:00      Discharge Exam: Vitals:   11/08/18 0440 11/08/18 0854  BP: 121/66 111/62  Pulse: 83 87  Resp: 16 20  Temp: 99.3 F (37.4 C) 99.1 F (37.3 C)  SpO2: 98% 100%   Vitals:   11/07/18 2027 11/08/18 0056 11/08/18 0440 11/08/18 0854  BP: (!) 144/67 123/64 121/66 111/62  Pulse: 88 87 83 87  Resp: 16 14 16 20   Temp: 99.8 F (37.7 C) 99.1 F (37.3 C) 99.3 F (37.4 C) 99.1 F (37.3 C)  TempSrc: Oral Oral Oral Oral  SpO2: 99% 98% 98% 100%  Weight:   65.5 kg   Height:        General: Pt is alert, awake, not in acute distress Cardiovascular: RRR, S1/S2 +, no rubs, no  gallops Respiratory: CTA bilaterally, no wheezing, no rhonchi Abdominal: Soft, NT, ND, bowel sounds + Extremities: no edema, no cyanosis    The results of significant diagnostics from this hospitalization (including imaging, microbiology, ancillary and laboratory) are listed below for reference.     Microbiology: Recent Results (from the past 240 hour(s))  Blood culture (routine x 2)     Status: None (Preliminary result)   Collection Time: 11/05/18 11:06 PM   Specimen: BLOOD RIGHT HAND  Result Value Ref Range Status   Specimen Description  BLOOD RIGHT HAND  Final   Special Requests   Final    BOTTLES DRAWN AEROBIC AND ANAEROBIC Blood Culture results may not be optimal due to an inadequate volume of blood received in culture bottles   Culture   Final    NO GROWTH 2 DAYS Performed at [facility], [place] [address], [place], [place] [address]    Report Status PENDING  Incomplete  SARS CORONAVIRUS 2 (TAT 6-24 HRS) Nasopharyngeal Nasopharyngeal Swab     Status: None   Collection Time: 11/05/18 11:18 PM   Specimen: Nasopharyngeal Swab  Result Value Ref Range Status   SARS Coronavirus 2 NEGATIVE NEGATIVE Final    Comment: (NOTE) SARS-CoV-2 target nucleic acids are NOT DETECTED. The SARS-CoV-2 RNA is generally detectable in upper and lower respiratory specimens during the acute phase of infection. Negative results do not preclude SARS-CoV-2 infection, do not rule out co-infections with other pathogens, and should not be used as the sole basis for treatment or other patient management decisions. Negative results must be combined with clinical observations, patient history, and epidemiological information. The expected result is Negative. Fact Sheet for Patients: SugarRoll.be Fact Sheet for Healthcare Providers: https://www.woods-mathews.com/ This test is not yet approved or cleared by the [place] FDA and  has been authorized for  detection and/or diagnosis of SARS-CoV-2 by FDA under an Emergency Use Authorization (EUA). This EUA will remain  in effect (meaning this test can be used) for the duration of the COVID-19 declaration under Section 56 4(b)(1) of the Act, 21 U.S.C. section 360bbb-3(b)(1), unless the authorization is terminated or revoked sooner. Performed at [facility], [place] [address], [place], [place] [address]   Blood Cultures x 2 sites     Status: None (Preliminary result)   Collection Time: 11/06/18  3:28 AM   Specimen: BLOOD RIGHT HAND  Result Value Ref Range Status   Specimen Description BLOOD RIGHT HAND  Final   Special Requests   Final    BOTTLES DRAWN AEROBIC AND ANAEROBIC Blood Culture adequate volume   Culture   Final    NO GROWTH 1 DAY Performed at [facility], [place] [address], [place], [place] [address]    Report Status PENDING  Incomplete  Urine culture     Status: None   Collection Time: 11/06/18 10:26 AM   Specimen: In/Out Cath Urine  Result Value Ref Range Status   Specimen Description IN/OUT CATH URINE  Final   Special Requests   Final    NONE Performed at [facility], [place] [address], [place], [place] [address]    Culture   Final    Multiple bacterial morphotypes present, none predominant. Suggest appropriate recollection if clinically indicated.   Report Status 11/07/2018 FINAL  Final  MRSA PCR Screening     Status: None   Collection Time: 11/06/18  8:33 PM   Specimen: Nasopharyngeal  Result Value Ref Range Status   MRSA by PCR NEGATIVE NEGATIVE Final    Comment:        The GeneXpert MRSA Assay (FDA approved for NASAL specimens only), is one component of a comprehensive MRSA colonization surveillance program. It is not intended to diagnose MRSA infection nor to guide or monitor treatment for MRSA infections. Performed at [facility], [place] [address] [place], [place] [address]      Labs: BNP (last 3 results) No results for input(s):  BNP in the last 8760 hours. Basic Metabolic Panel: Recent Labs  Lab 11/05/18 2351 11/06/18 0328 11/08/18 0415  NA 136 138 134*  K 4.5 4.3 4.0  CL 99 103 98  CO2 24 21* 23  GLUCOSE 262* 189* 291*  BUN 74* 72* 72*  CREATININE 3.59* 3.29* 3.47*  CALCIUM 9.8 9.3 9.0   Liver Function Tests: Recent Labs  Lab 11/05/18 2351 11/08/18 0415  AST 21 50*  ALT 19 43  ALKPHOS 59 88  BILITOT 0.6 0.5  PROT 7.0 6.0*  ALBUMIN 3.4* 2.7*   No results for input(s): LIPASE, AMYLASE in the last 168 hours. No results for input(s): AMMONIA in the last 168 hours. CBC: Recent Labs  Lab 11/05/18 2351 11/06/18 0328 11/08/18 0415  WBC 4.4 4.2 2.5*  NEUTROABS 3.7  --  1.6*  HGB 11.4* 11.4* 10.1*  HCT 36.1 35.6* 30.9*  MCV 92.6 93.0 89.3  PLT 160 146* 78*   Cardiac Enzymes: No results for input(s): CKTOTAL, CKMB, CKMBINDEX, TROPONINI in the last 168 hours. BNP: Invalid input(s): POCBNP CBG: Recent Labs  Lab 11/07/18 0646 11/07/18 1147 11/07/18 1753 11/07/18 2140 11/08/18 0618  GLUCAP 113* 149* 108* 199* 275*   D-Dimer No results for input(s): DDIMER in the last 72 hours. Hgb A1c Recent Labs    11/06/18 0328  HGBA1C 8.4*   Lipid Profile No results for input(s): CHOL, HDL, LDLCALC, TRIG, CHOLHDL, LDLDIRECT in the last 72 hours. Thyroid function studies No results for input(s): TSH, T4TOTAL, T3FREE, THYROIDAB in the last 72 hours.  Invalid input(s): FREET3 Anemia work up No results for input(s): VITAMINB12, FOLATE, FERRITIN, TIBC, IRON, RETICCTPCT in the last 72 hours. Urinalysis    Component Value Date/Time   COLORURINE YELLOW 11/06/2018 0328   APPEARANCEUR CLOUDY (A) 11/06/2018 0328   LABSPEC 1.011 11/06/2018 0328   PHURINE 7.0 11/06/2018 0328   GLUCOSEU 50 (A) 11/06/2018 0328   HGBUR SMALL (A) 11/06/2018 0328   BILIRUBINUR NEGATIVE 11/06/2018 0328   KETONESUR NEGATIVE 11/06/2018 0328   PROTEINUR 100 (A) 11/06/2018 0328   UROBILINOGEN 0.2 03/29/2014 1331   NITRITE  NEGATIVE 11/06/2018 0328   LEUKOCYTESUR LARGE (A) 11/06/2018 0328   Sepsis Labs Invalid input(s): PROCALCITONIN,  WBC,  LACTICIDVEN Microbiology Recent Results (from the past 240 hour(s))  Blood culture (routine x 2)     Status: None (Preliminary result)   Collection Time: 11/05/18 11:06 PM   Specimen: BLOOD RIGHT HAND  Result Value Ref Range Status   Specimen Description BLOOD RIGHT HAND  Final   Special Requests   Final    BOTTLES DRAWN AEROBIC AND ANAEROBIC Blood Culture results may not be optimal due to an inadequate volume of blood received in culture bottles   Culture   Final    NO GROWTH 2 DAYS Performed at [facility], [place] [address] [place], [place] [address]    Report Status PENDING  Incomplete  SARS CORONAVIRUS 2 (TAT 6-24 HRS) Nasopharyngeal Nasopharyngeal Swab     Status: None   Collection Time: 11/05/18 11:18 PM   Specimen: Nasopharyngeal Swab  Result Value Ref Range Status   SARS Coronavirus 2 NEGATIVE NEGATIVE Final    Comment: (NOTE) SARS-CoV-2 target nucleic acids are NOT DETECTED. The SARS-CoV-2 RNA is generally detectable in upper and lower respiratory specimens during the acute phase of infection. Negative results do not preclude SARS-CoV-2 infection, do not rule out co-infections with other pathogens, and should not be used as the sole basis for treatment or other patient management decisions. Negative results must be combined with clinical observations, patient  history, and epidemiological information. The expected result is Negative. Fact Sheet for Patients: SugarRoll.be Fact Sheet for Healthcare Providers: https://www.woods-mathews.com/ This test is not yet approved or cleared by the [place] FDA and  has been authorized for detection and/or diagnosis of SARS-CoV-2 by FDA under an Emergency Use Authorization (EUA). This EUA will remain  in effect (meaning this test can be used) for the duration of  the COVID-19 declaration under Section 56 4(b)(1) of the Act, 21 U.S.C. section 360bbb-3(b)(1), unless the authorization is terminated or revoked sooner. Performed at [facility], [place] [address], [place], [place] [address]   Blood Cultures x 2 sites     Status: None (Preliminary result)   Collection Time: 11/06/18  3:28 AM   Specimen: BLOOD RIGHT HAND  Result Value Ref Range Status   Specimen Description BLOOD RIGHT HAND  Final   Special Requests   Final    BOTTLES DRAWN AEROBIC AND ANAEROBIC Blood Culture adequate volume   Culture   Final    NO GROWTH 1 DAY Performed at [facility], [place] [address] [place], [place] [address]    Report Status PENDING  Incomplete  Urine culture     Status: None   Collection Time: 11/06/18 10:26 AM   Specimen: In/Out Cath Urine  Result Value Ref Range Status   Specimen Description IN/OUT CATH URINE  Final   Special Requests   Final    NONE Performed at [facility], [place] [address] [place], [place] [address]    Culture   Final    Multiple bacterial morphotypes present, none predominant. Suggest appropriate recollection if clinically indicated.   Report Status 11/07/2018 FINAL  Final  MRSA PCR Screening     Status: None   Collection Time: 11/06/18  8:33 PM   Specimen: Nasopharyngeal  Result Value Ref Range Status   MRSA by PCR NEGATIVE NEGATIVE Final    Comment:        The GeneXpert MRSA Assay (FDA approved for NASAL specimens only), is one component of a comprehensive MRSA colonization surveillance program. It is not intended to diagnose MRSA infection nor to guide or monitor treatment for MRSA infections. Performed at [facility], [place] [address], [place], [place] [address]      Time coordinating discharge: Over 30 minutes  SIGNED:   Darliss Cheney, MD  [facility] 11/08/2018, 9:29 AM  If 7PM-7AM, please contact night-coverage [email or web address] Password [email or web address]

## 2018-11-08 NOTE — Progress Notes (Signed)
Patient is agreeable to follow up with hematology outpatient and schedule a follow up with her primary care doctor. All questions and concerns answered to her satisfaction.

## 2018-11-10 ENCOUNTER — Ambulatory Visit (HOSPITAL_COMMUNITY)
Admission: RE | Admit: 2018-11-10 | Discharge: 2018-11-10 | Disposition: A | Discharge status: Home / Self Care | Payer: Commercial Managed Care - PPO | Source: Ambulatory Visit | Attending: Nephrology | Admitting: Nephrology

## 2018-11-10 VITALS — BP 153/89 | HR 84 | Temp 97.3°F | Resp 20

## 2018-11-10 DIAGNOSIS — N184 Chronic kidney disease, stage 4 (severe): Secondary | ICD-10-CM | POA: Insufficient documentation

## 2018-11-10 DIAGNOSIS — D631 Anemia in chronic kidney disease: Secondary | ICD-10-CM | POA: Diagnosis not present

## 2018-11-10 DIAGNOSIS — N179 Acute kidney failure, unspecified: Secondary | ICD-10-CM | POA: Diagnosis present

## 2018-11-10 LAB — IRON AND TIBC
Iron: 66 ug/dL (ref 28–170)
Saturation Ratios: 29 % (ref 10.4–31.8)
TIBC: 224 ug/dL — ABNORMAL LOW (ref 250–450)
UIBC: 158 ug/dL

## 2018-11-10 LAB — FERRITIN: Ferritin: 5262 ng/mL — ABNORMAL HIGH (ref 11–307)

## 2018-11-10 LAB — CULTURE, BLOOD (ROUTINE X 2): Culture: NO GROWTH

## 2018-11-10 LAB — POCT HEMOGLOBIN-HEMACUE: Hemoglobin: 10.1 g/dL — ABNORMAL LOW (ref 12.0–15.0)

## 2018-11-10 MED ORDER — EPOETIN ALFA 40000 UNIT/ML IJ SOLN
INTRAMUSCULAR | Status: AC
  Filled 2018-11-10: qty 1

## 2018-11-10 MED ORDER — EPOETIN ALFA 40000 UNIT/ML IJ SOLN
40000.0000 [IU] | INTRAMUSCULAR | Status: DC
  Administered 2018-11-10: 40000 [IU] via SUBCUTANEOUS

## 2018-11-11 LAB — CULTURE, BLOOD (ROUTINE X 2)
Culture: NO GROWTH
Special Requests: ADEQUATE

## 2018-11-15 ENCOUNTER — Inpatient Hospital Stay (HOSPITAL_BASED_OUTPATIENT_CLINIC_OR_DEPARTMENT_OTHER): Payer: Commercial Managed Care - PPO | Admitting: Hematology and Oncology

## 2018-11-15 ENCOUNTER — Other Ambulatory Visit: Payer: Self-pay

## 2018-11-15 ENCOUNTER — Inpatient Hospital Stay: Payer: Commercial Managed Care - PPO | Attending: Hematology and Oncology

## 2018-11-15 ENCOUNTER — Encounter: Payer: Self-pay | Admitting: Hematology and Oncology

## 2018-11-15 ENCOUNTER — Telehealth: Payer: Self-pay | Admitting: *Deleted

## 2018-11-15 DIAGNOSIS — E785 Hyperlipidemia, unspecified: Secondary | ICD-10-CM | POA: Insufficient documentation

## 2018-11-15 DIAGNOSIS — D539 Nutritional anemia, unspecified: Secondary | ICD-10-CM | POA: Diagnosis present

## 2018-11-15 DIAGNOSIS — K589 Irritable bowel syndrome without diarrhea: Secondary | ICD-10-CM | POA: Diagnosis not present

## 2018-11-15 DIAGNOSIS — D631 Anemia in chronic kidney disease: Secondary | ICD-10-CM

## 2018-11-15 DIAGNOSIS — N184 Chronic kidney disease, stage 4 (severe): Secondary | ICD-10-CM | POA: Insufficient documentation

## 2018-11-15 DIAGNOSIS — D61818 Other pancytopenia: Secondary | ICD-10-CM | POA: Diagnosis not present

## 2018-11-15 DIAGNOSIS — E1122 Type 2 diabetes mellitus with diabetic chronic kidney disease: Secondary | ICD-10-CM | POA: Diagnosis not present

## 2018-11-15 DIAGNOSIS — Z794 Long term (current) use of insulin: Secondary | ICD-10-CM | POA: Diagnosis not present

## 2018-11-15 DIAGNOSIS — I130 Hypertensive heart and chronic kidney disease with heart failure and stage 1 through stage 4 chronic kidney disease, or unspecified chronic kidney disease: Secondary | ICD-10-CM | POA: Insufficient documentation

## 2018-11-15 DIAGNOSIS — Z79899 Other long term (current) drug therapy: Secondary | ICD-10-CM | POA: Diagnosis not present

## 2018-11-15 DIAGNOSIS — K219 Gastro-esophageal reflux disease without esophagitis: Secondary | ICD-10-CM | POA: Diagnosis not present

## 2018-11-15 LAB — CBC WITH DIFFERENTIAL/PLATELET
Abs Immature Granulocytes: 0.08 10*3/uL — ABNORMAL HIGH (ref 0.00–0.07)
Basophils Absolute: 0 10*3/uL (ref 0.0–0.1)
Basophils Relative: 0 %
Eosinophils Absolute: 0.3 10*3/uL (ref 0.0–0.5)
Eosinophils Relative: 4 %
HCT: 33.2 % — ABNORMAL LOW (ref 36.0–46.0)
Hemoglobin: 10.4 g/dL — ABNORMAL LOW (ref 12.0–15.0)
Immature Granulocytes: 1 %
Lymphocytes Relative: 21 %
Lymphs Abs: 1.7 10*3/uL (ref 0.7–4.0)
MCH: 29.4 pg (ref 26.0–34.0)
MCHC: 31.3 g/dL (ref 30.0–36.0)
MCV: 93.8 fL (ref 80.0–100.0)
Monocytes Absolute: 0.7 10*3/uL (ref 0.1–1.0)
Monocytes Relative: 9 %
Neutro Abs: 5.3 10*3/uL (ref 1.7–7.7)
Neutrophils Relative %: 65 %
Platelets: 224 10*3/uL (ref 150–400)
RBC: 3.54 MIL/uL — ABNORMAL LOW (ref 3.87–5.11)
RDW: 14.4 % (ref 11.5–15.5)
WBC: 8.2 10*3/uL (ref 4.0–10.5)
nRBC: 0 % (ref 0.0–0.2)

## 2018-11-15 LAB — VITAMIN B12: Vitamin B-12: 881 pg/mL (ref 180–914)

## 2018-11-15 NOTE — Telephone Encounter (Signed)
-----   Message from [name] Lark, MD sent at 11/15/2018  2:04 PM EDT ----- Regarding: labs I tried to call her to review CBC and B12 level Mailbox is full Can you try later? B12 and repeat CBC is normal as expected She is still a bit anemic from her kidney problem but WBC and platelets are now normal No need to return

## 2018-11-15 NOTE — Telephone Encounter (Signed)
Telephone call to T[place] and advised lab results as directed below. She appreciated the call and verbalized an understanding.

## 2018-11-15 NOTE — Assessment & Plan Note (Addendum)
She developed acute pancytopenia during hospitalization of unknown etiology I suspect it could be related to possible cellulitis/infection/side effects of antibiotic treatment We discussed checking minimum blood work with CBC and serum vitamin B12 only Repeat stat CBC came back with normal white blood cell count and platelet count which is what I would expect So I think she only had a transient pancytopenia due to infection recently I will call her with test result of serum vitamin B12 level She does not need long-term follow-up She will continue erythropoietin stimulating agent through nephrology service for chronic anemia secondary to anemia chronic kidney failure

## 2018-11-15 NOTE — Progress Notes (Signed)
[facility] CONSULT NOTE  Patient Care Team: Lilian Coma., MD as PCP - General (Internal Medicine) Deterding, Jeneen Rinks, MD as Consulting Physician (Nephrology)  CHIEF COMPLAINTS/PURPOSE OF CONSULTATION:  Acute pancytopenia, for further follow-up  HISTORY OF PRESENTING ILLNESS:  Holly [name] 53 y.o. female is here because of recent finding of acute pancytopenia while hospitalized.  She was found to have abnormal CBC from from recent hospitalization On her admission date, she have mild anemia only She was admitted for possible infection/cellulitis of her foot She was placed on antibiotics treatment She developed pancytopenia while hospitalized She was subsequently discharged She denies recent bruising/bleeding, such as spontaneous epistaxis, hematuria, melena or hematochezia  The patient denies history of liver disease She has chronic kidney disease stage IV due to diabetes, hypertension and history of autoimmune disorder She is receiving ESA through nephrology service but she is currently not on hemodialysis She had foot surgery several years ago Since discharge from the hospital, cellulitis has resolved She denies recent fever or chills  MEDICAL HISTORY:  Past Medical History:  Diagnosis Date  . Acute cystitis   . Anxiety state, unspecified   . Background diabetic retinopathy(362.01)   . Bulimia   . Calculus of kidney   . Cellulitis and abscess of foot 01/24/2015   left foot  . 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 Chronic kidney disease, stage IV (severe) ([place])   . Complication of anesthesia    slow to wake up, had lost a lot of blood - 2010  . Diarrhea    symptomatic  . Disorders of magnesium metabolism   . DM2 (diabetes mellitus, type 2) ([place])   . Dysthymic disorder   . Dysuria   . Edema    moderate  . Encounter for long-term (current) use of other medications   . Esophageal reflux   . Essential hypertension, benign   .  Fever 10/2018  . Heart murmur    been told very slight, never given her any problems  . History of blood transfusion X 6-7; 2010 - present (03/29/2014)   "related to OR; kidney issues"  . HLD (hyperlipidemia)    HDL goal >50, LDL goal <100  . Iron deficiency anemia    "suppose to get procrit injections q 3 wks; usually don't" do it" (03/29/2014)  . Irritable bowel syndrome   . Left heart failure ([place])   . Muscle weakness (generalized)   . Neuralgia, neuritis, and radiculitis, unspecified   . Palpitations   . Peripheral autonomic neuropathy in disorders classified elsewhere(337.1)   . Pneumonia 2010  . Primary pulmonary hypertension (HCC)    not seen at CATH  (69m Hg), pt not aware of this  . Type II or unspecified type diabetes mellitus with other specified manifestations, not stated as uncontrolled    [facility] Normal coronary arteries  . Unspecified essential hypertension    [facility] No significant renal artery stenosis bilaterally    SURGICAL HISTORY: Past Surgical History:  Procedure Laterality Date  . AMPUTATION Left 01/26/2015   Procedure: POST TRANS MET ;  Surgeon: MNewt Minion MD;  [place]  Service: [place]ics;  Laterality: Left;  . APPENDECTOMY    . AV FISTULA PLACEMENT Right 01/08/2015   Procedure: ARTERIOVENOUS (AV) FISTULA CREATION;  Surgeon: CAngelia Mould MD;  [place]  Service: Vascular;  Laterality: Right;  . CESAREAN SECTION  2001; 1999; 1994  . DILATION AND CURETTAGE OF UTERUS    . EYE SURGERY    . INCISION AND DRAINAGE OF WOUND Right 2010   "serious staph infection"  . PARS PLANA VITRECTOMY Bilateral 2010   "related to DM"  . TONSILLECTOMY AND ADENOIDECTOMY      SOCIAL HISTORY: Social History   Socioeconomic History  . Marital status: Single    Spouse name: Not on file  . Number of children: 4  . Years of education:  Not on file  . Highest education level: Not on file  Occupational History  . Occupation: collections  Social Needs  . Financial resource strain: Not on file  . Food insecurity    Worry: Not on file    Inability: Not on file  . Transportation needs    Medical: Not on file    Non-medical: Not on file  Tobacco Use  . Smoking status: Never Smoker  . Smokeless tobacco: Never Used  Substance and Sexual Activity  . Alcohol use: Yes    Comment: 03/29/2014 "might have a drink a couple times/yr"  . Drug use: No  . Sexual activity: Yes  Lifestyle  . Physical activity    Days per week: Not on file    Minutes per session: Not on file  . Stress: Not on file  Relationships  . Social Herbalist on phone: Not on file    Gets together: Not on file    Attends religious service: Not on file    Active member of club or organization: Not on file    Attends meetings of clubs or organizations: Not on file    Relationship status: Not on file  . Intimate partner violence    Fear of current or ex partner: Not on file    Emotionally abused: Not on file    Physically abused: Not on file    Forced sexual activity: Not on file  Other Topics Concern  . Not on file  Social History Narrative  . Not on file    FAMILY HISTORY: Family History  Problem Relation Age of Onset  . Hypertension Mother   . Myelodysplastic syndrome Father   . Pancreatic cancer Other     ALLERGIES:  is allergic to bactrim; byetta 10 mcg pen [exenatide]; hctz [hydrochlorothiazide]; spironolactone; sulfa antibiotics; sulfacetamide sodium; and sulfamethoxazole-trimethoprim.  MEDICATIONS:  Current Outpatient Medications  Medication Sig Dispense Refill  . benazepril (LOTENSIN) 40 MG tablet Take 40 mg by mouth at bedtime.    . calcitRIOL (ROCALTROL) 0.25 MCG capsule Take 0.25 mcg by mouth daily.     . calcium acetate (PHOSLO) 667 MG capsule Take 667 mg by mouth 2 (two) times daily.    . felodipine (PLENDIL) 5 MG 24  hr tablet Take 5 mg by mouth at bedtime.  6  . FLUoxetine (PROZAC) 40 MG capsule Take 40 mg by mouth every morning.     . furosemide (LASIX) 20 MG tablet Take 20 mg by mouth 2 (two) times daily.    Marland Kitchen glucose blood (ONETOUCH VERIO) test strip 1 each by Other route 2 (two) times daily. And lancets 2/day 250/.61 100 each 12  . Insulin Glargine, 1 Unit Dial, 300 UNIT/ML SOPN Inject 14-16 Units into the skin daily.    Marland Kitchen linagliptin (TRADJENTA) 5 MG TABS tablet Take 5 mg by mouth daily.    . Nutritional Supplements (FEEDING SUPPLEMENT, NEPRO CARB STEADY,) LIQD Take 237 mLs by mouth 2 (two) times daily  between meals. (Patient not taking: Reported on 11/06/2018) 60 Can 0  . oxyCODONE-acetaminophen (ROXICET) 5-325 MG tablet Take 1 tablet by mouth every 6 (six) hours as needed. (Patient not taking: Reported on 11/06/2018) 15 tablet 0  . sodium bicarbonate 650 MG tablet Take 1,300 mg by mouth 2 (two) times daily.    Marland Kitchen zolpidem (AMBIEN) 5 MG tablet Take 5 mg by mouth at bedtime.     No current facility-administered medications for this visit.     REVIEW OF SYSTEMS:   Constitutional: Denies fevers, chills or abnormal night sweats Eyes: Denies blurriness of vision, double vision or watery eyes Ears, nose, mouth, throat, and face: Denies mucositis or sore throat Respiratory: Denies cough, dyspnea or wheezes Cardiovascular: Denies palpitation, chest discomfort or lower extremity swelling Gastrointestinal:  Denies nausea, heartburn or change in bowel habits Skin: Denies abnormal skin rashes Lymphatics: Denies new lymphadenopathy or easy bruising Neurological:Denies numbness, tingling or new weaknesses Behavioral/Psych: Mood is stable, no new changes  All other systems were reviewed with the patient and are negative.  PHYSICAL EXAMINATION: ECOG PERFORMANCE STATUS: 0 - Asymptomatic  Vitals:   11/15/18 1132  BP: (!) 165/91  Pulse: 87  Resp: 18  Temp: 99.1 F (37.3 C)  SpO2: 99%   Filed Weights    11/15/18 1132  Weight: 150 lb 9.6 oz (68.3 kg)    GENERAL:alert, no distress and comfortable SKIN: skin color, texture, turgor are normal, no rashes or significant lesions EYES: normal, conjunctiva are pink and non-injected, sclera clear OROPHARYNX:no exudate, no erythema and lips, buccal mucosa, and tongue normal  NECK: supple, thyroid normal size, non-tender, without nodularity LYMPH:  no palpable lymphadenopathy in the cervical, axillary or inguinal LUNGS: clear to auscultation and percussion with normal breathing effort HEART: regular rate & rhythm and no murmurs and no lower extremity edema ABDOMEN:abdomen soft, non-tender and normal bowel sounds Musculoskeletal:no cyanosis of digits and no clubbing  PSYCH: alert & oriented x 3 with fluent speech NEURO: no focal motor/sensory deficits  LABORATORY DATA:  I have reviewed the data as listed Lab Results  Component Value Date   WBC 8.2 11/15/2018   HGB 10.4 (L) 11/15/2018   HCT 33.2 (L) 11/15/2018   MCV 93.8 11/15/2018   PLT 224 11/15/2018   I have also reviewed information from care everywhere  RADIOGRAPHIC STUDIES: I have personally reviewed the radiological images as listed and agreed with the findings in the report. Mr Foot Left Wo Contrast  Result Date: 11/06/2018 CLINICAL DATA:  Diabetic patient with left foot pain and swelling. History of prior amputation. EXAM: MRI OF THE LEFT FOOT WITHOUT CONTRAST TECHNIQUE: Multiplanar, multisequence MR imaging of the left foot was performed. No intravenous contrast was administered. COMPARISON:  Plain films left foot 11/05/2018. MRI left foot 10/31/2015. FINDINGS: Bones/Joint/Cartilage The patient is status post amputation at the level of the tarsometatarsal joints as seen on the prior exams. There is no marrow edema to suggest osteomyelitis. No fracture, stress change or worrisome lesion. Ligaments Intact. Muscles and Tendons Intact.  No evidence of tenosynovitis. Soft tissues Negative  for abscess.  No mass.  No soft tissue edema. IMPRESSION: No acute abnormality. Negative for abscess, osteomyelitis or septic joint. Electronically Signed   By: Inge Rise M.D.   On: 11/06/2018 09:17   Dg Chest Port 1 View  Result Date: 11/05/2018 CLINICAL DATA:  Fever. EXAM: PORTABLE CHEST 1 VIEW COMPARISON:  Chest x-ray dated January 28, 2015. FINDINGS: The heart size and mediastinal contours are within  normal limits. Both lungs are clear. The visualized skeletal structures are unremarkable. IMPRESSION: No active disease. Electronically Signed   By: Titus Dubin M.D.   On: 11/05/2018 22:18   Dg Foot Complete Left  Result Date: 11/06/2018 CLINICAL DATA:  Foot pain EXAM: LEFT FOOT - COMPLETE 3+ VIEW COMPARISON:  Numerous priors, most recent comparison 10/31/2015 radiograph. FINDINGS: Patient has undergone amputation of all 5 rays of level of the tarsometatarsal joints with some residual osseous fragments from the bases of the fourth fifth metatarsals seen along the lateral foot. There is extensive overlying soft tissue swelling and irregularity but without subcutaneous gas or unexpected foreign body evident. There is a questionable area of subcortical lucency involving the distal pole of the lateral cuneiform. Plantar calcaneal spur is noted. Vascular calcium noted in the soft tissues. IMPRESSION: Postoperative changes from prior trans metatarsal amputation of all 5 rays. Soft tissue swelling and irregularity along the anterior soft tissues. Small amount of subcortical lucency involving the lateral cuneiform could reflect early features of osteomyelitis. Electronically Signed   By: Lovena Le M.D.   On: 11/06/2018 00:00    ASSESSMENT & PLAN Deficiency anemia She developed acute pancytopenia during hospitalization of unknown etiology I suspect it could be related to possible cellulitis/infection/side effects of antibiotic treatment We discussed checking minimum blood work with CBC and serum  vitamin B12 only Repeat stat CBC came back with normal white blood cell count and platelet count which is what I would expect So I think she only had a transient pancytopenia due to infection recently I will call her with test result of serum vitamin B12 level She does not need long-term follow-up She will continue erythropoietin stimulating agent through nephrology service for chronic anemia secondary to anemia chronic kidney failure  Anemia in chronic kidney disease She is doing well with ESA through nephrology office I will defer to them for treatment and follow-up   Orders Placed This Encounter  Procedures  . Vitamin B12    Standing Status:   Future    Number of Occurrences:   1    Standing Expiration Date:   12/20/2019  . CBC with Differential/Platelet    Standing Status:   Future    Number of Occurrences:   1    Standing Expiration Date:   12/20/2019   All questions were answered. The patient knows to call the clinic with any problems, questions or concerns. No barriers to learning was detected. I spent 25 minutes counseling the patient face to face. The total time spent in the appointment was 30 minutes and more than 50% was on counseling and review of test results  [name] Lark, MD 11/15/2018 12:19 PM

## 2018-11-15 NOTE — Assessment & Plan Note (Signed)
She is doing well with ESA through nephrology office I will defer to them for treatment and follow-up

## 2018-12-01 ENCOUNTER — Inpatient Hospital Stay (HOSPITAL_COMMUNITY): Admission: RE | Admit: 2018-12-01 | Payer: Commercial Managed Care - PPO | Source: Ambulatory Visit

## 2018-12-01 ENCOUNTER — Encounter (HOSPITAL_COMMUNITY): Payer: Self-pay

## 2018-12-13 ENCOUNTER — Other Ambulatory Visit: Payer: Self-pay

## 2018-12-13 ENCOUNTER — Ambulatory Visit (HOSPITAL_COMMUNITY)
Admission: RE | Admit: 2018-12-13 | Discharge: 2018-12-13 | Disposition: A | Discharge status: Home / Self Care | Payer: Commercial Managed Care - PPO | Source: Ambulatory Visit | Attending: Nephrology | Admitting: Nephrology

## 2018-12-13 VITALS — BP 136/76 | HR 85 | Temp 97.2°F | Resp 18

## 2018-12-13 DIAGNOSIS — N184 Chronic kidney disease, stage 4 (severe): Secondary | ICD-10-CM

## 2018-12-13 DIAGNOSIS — N179 Acute kidney failure, unspecified: Secondary | ICD-10-CM | POA: Diagnosis present

## 2018-12-13 LAB — RENAL FUNCTION PANEL
Albumin: 3.7 g/dL (ref 3.5–5.0)
Anion gap: 13 (ref 5–15)
BUN: 47 mg/dL — ABNORMAL HIGH (ref 6–20)
CO2: 23 mmol/L (ref 22–32)
Calcium: 9.9 mg/dL (ref 8.9–10.3)
Chloride: 99 mmol/L (ref 98–111)
Creatinine, Ser: 2.66 mg/dL — ABNORMAL HIGH (ref 0.44–1.00)
GFR calc Af Amer: 23 mL/min — ABNORMAL LOW (ref >60)
GFR calc non Af Amer: 19 mL/min — ABNORMAL LOW (ref >60)
Glucose, Bld: 196 mg/dL — ABNORMAL HIGH (ref 70–99)
Phosphorus: 4.4 mg/dL (ref 2.5–4.6)
Potassium: 4.3 mmol/L (ref 3.5–5.1)
Sodium: 135 mmol/L (ref 135–145)

## 2018-12-13 LAB — FERRITIN: Ferritin: 785 ng/mL — ABNORMAL HIGH (ref 11–307)

## 2018-12-13 LAB — IRON AND TIBC
Iron: 73 ug/dL (ref 28–170)
Saturation Ratios: 30 % (ref 10.4–31.8)
TIBC: 241 ug/dL — ABNORMAL LOW (ref 250–450)
UIBC: 168 ug/dL

## 2018-12-13 LAB — POCT HEMOGLOBIN-HEMACUE: Hemoglobin: 11.3 g/dL — ABNORMAL LOW (ref 12.0–15.0)

## 2018-12-13 MED ORDER — EPOETIN ALFA 40000 UNIT/ML IJ SOLN
40000.0000 [IU] | INTRAMUSCULAR | Status: DC
  Administered 2018-12-13: 40000 [IU] via SUBCUTANEOUS

## 2018-12-14 LAB — PTH, INTACT AND CALCIUM
Calcium, Total (PTH): 10.1 mg/dL (ref 8.7–10.2)
PTH: 106 pg/mL — ABNORMAL HIGH (ref 15–65)

## 2019-01-03 ENCOUNTER — Encounter (HOSPITAL_COMMUNITY): Payer: Commercial Managed Care - PPO

## 2019-01-16 ENCOUNTER — Ambulatory Visit (HOSPITAL_COMMUNITY)
Admission: RE | Admit: 2019-01-16 | Discharge: 2019-01-16 | Disposition: A | Discharge status: Home / Self Care | Payer: Commercial Managed Care - PPO | Source: Ambulatory Visit | Attending: Nephrology | Admitting: Nephrology

## 2019-01-16 ENCOUNTER — Other Ambulatory Visit: Payer: Self-pay

## 2019-01-16 VITALS — BP 171/84 | HR 80 | Temp 96.5°F | Resp 18

## 2019-01-16 DIAGNOSIS — N184 Chronic kidney disease, stage 4 (severe): Secondary | ICD-10-CM | POA: Diagnosis present

## 2019-01-16 DIAGNOSIS — N179 Acute kidney failure, unspecified: Secondary | ICD-10-CM | POA: Diagnosis not present

## 2019-01-16 LAB — RENAL FUNCTION PANEL
Albumin: 3.7 g/dL (ref 3.5–5.0)
Anion gap: 11 (ref 5–15)
BUN: 58 mg/dL — ABNORMAL HIGH (ref 6–20)
CO2: 22 mmol/L (ref 22–32)
Calcium: 10.2 mg/dL (ref 8.9–10.3)
Chloride: 105 mmol/L (ref 98–111)
Creatinine, Ser: 2.72 mg/dL — ABNORMAL HIGH (ref 0.44–1.00)
GFR calc Af Amer: 22 mL/min — ABNORMAL LOW (ref >60)
GFR calc non Af Amer: 19 mL/min — ABNORMAL LOW (ref >60)
Glucose, Bld: 145 mg/dL — ABNORMAL HIGH (ref 70–99)
Phosphorus: 4.3 mg/dL (ref 2.5–4.6)
Potassium: 4.6 mmol/L (ref 3.5–5.1)
Sodium: 138 mmol/L (ref 135–145)

## 2019-01-16 LAB — IRON AND TIBC
Iron: 70 ug/dL (ref 28–170)
Saturation Ratios: 30 % (ref 10.4–31.8)
TIBC: 237 ug/dL — ABNORMAL LOW (ref 250–450)
UIBC: 167 ug/dL

## 2019-01-16 LAB — FERRITIN: Ferritin: 640 ng/mL — ABNORMAL HIGH (ref 11–307)

## 2019-01-16 LAB — POCT HEMOGLOBIN-HEMACUE: Hemoglobin: 11.6 g/dL — ABNORMAL LOW (ref 12.0–15.0)

## 2019-01-16 MED ORDER — EPOETIN ALFA 40000 UNIT/ML IJ SOLN
INTRAMUSCULAR | Status: AC
  Filled 2019-01-16: qty 1

## 2019-01-16 MED ORDER — EPOETIN ALFA 40000 UNIT/ML IJ SOLN
40000.0000 [IU] | INTRAMUSCULAR | Status: DC
  Administered 2019-01-16: 40000 [IU] via SUBCUTANEOUS

## 2019-01-17 LAB — PTH, INTACT AND CALCIUM
Calcium, Total (PTH): 10 mg/dL (ref 8.7–10.2)
PTH: 82 pg/mL — ABNORMAL HIGH (ref 15–65)

## 2019-01-24 ENCOUNTER — Encounter (HOSPITAL_COMMUNITY): Payer: Commercial Managed Care - PPO

## 2019-02-06 ENCOUNTER — Inpatient Hospital Stay (HOSPITAL_COMMUNITY): Admission: RE | Admit: 2019-02-06 | Payer: Commercial Managed Care - PPO | Source: Ambulatory Visit

## 2019-02-15 ENCOUNTER — Inpatient Hospital Stay (HOSPITAL_COMMUNITY): Admission: RE | Admit: 2019-02-15 | Payer: Commercial Managed Care - PPO | Source: Ambulatory Visit

## 2019-02-23 ENCOUNTER — Encounter (HOSPITAL_COMMUNITY)
Admission: RE | Admit: 2019-02-23 | Discharge: 2019-02-23 | Disposition: A | Discharge status: Home / Self Care | Payer: Commercial Managed Care - PPO | Source: Ambulatory Visit | Attending: Nephrology | Admitting: Nephrology

## 2019-02-23 ENCOUNTER — Other Ambulatory Visit: Payer: Self-pay

## 2019-02-23 VITALS — BP 174/89 | HR 82 | Temp 98.2°F | Resp 18

## 2019-02-23 DIAGNOSIS — N184 Chronic kidney disease, stage 4 (severe): Secondary | ICD-10-CM | POA: Diagnosis present

## 2019-02-23 DIAGNOSIS — N179 Acute kidney failure, unspecified: Secondary | ICD-10-CM

## 2019-02-23 LAB — RENAL FUNCTION PANEL
Albumin: 3.9 g/dL (ref 3.5–5.0)
Anion gap: 10 (ref 5–15)
BUN: 63 mg/dL — ABNORMAL HIGH (ref 6–20)
CO2: 20 mmol/L — ABNORMAL LOW (ref 22–32)
Calcium: 10.2 mg/dL (ref 8.9–10.3)
Chloride: 106 mmol/L (ref 98–111)
Creatinine, Ser: 2.8 mg/dL — ABNORMAL HIGH (ref 0.44–1.00)
GFR calc Af Amer: 21 mL/min — ABNORMAL LOW (ref >60)
GFR calc non Af Amer: 18 mL/min — ABNORMAL LOW (ref >60)
Glucose, Bld: 253 mg/dL — ABNORMAL HIGH (ref 70–99)
Phosphorus: 5.3 mg/dL — ABNORMAL HIGH (ref 2.5–4.6)
Potassium: 5.6 mmol/L — ABNORMAL HIGH (ref 3.5–5.1)
Sodium: 136 mmol/L (ref 135–145)

## 2019-02-23 LAB — FERRITIN: Ferritin: 723 ng/mL — ABNORMAL HIGH (ref 11–307)

## 2019-02-23 LAB — IRON AND TIBC
Iron: 104 ug/dL (ref 28–170)
Saturation Ratios: 40 % — ABNORMAL HIGH (ref 10.4–31.8)
TIBC: 259 ug/dL (ref 250–450)
UIBC: 155 ug/dL

## 2019-02-23 LAB — POCT HEMOGLOBIN-HEMACUE: Hemoglobin: 11.9 g/dL — ABNORMAL LOW (ref 12.0–15.0)

## 2019-02-23 MED ORDER — EPOETIN ALFA 40000 UNIT/ML IJ SOLN
INTRAMUSCULAR | Status: AC
  Administered 2019-02-23: 40000 [IU] via SUBCUTANEOUS
  Filled 2019-02-23: qty 1

## 2019-02-23 MED ORDER — EPOETIN ALFA 40000 UNIT/ML IJ SOLN: 40000.0000 [IU] | INTRAMUSCULAR | Status: DC

## 2019-02-24 LAB — PTH, INTACT AND CALCIUM
Calcium, Total (PTH): 10.5 mg/dL — ABNORMAL HIGH (ref 8.7–10.2)
PTH: 91 pg/mL — ABNORMAL HIGH (ref 15–65)

## 2019-03-01 ENCOUNTER — Encounter (HOSPITAL_COMMUNITY): Payer: Commercial Managed Care - PPO

## 2019-03-09 ENCOUNTER — Encounter (HOSPITAL_COMMUNITY): Payer: Commercial Managed Care - PPO

## 2019-03-23 ENCOUNTER — Encounter (HOSPITAL_COMMUNITY): Payer: Commercial Managed Care - PPO

## 2019-03-30 ENCOUNTER — Ambulatory Visit (HOSPITAL_COMMUNITY)
Admission: RE | Admit: 2019-03-30 | Discharge: 2019-03-30 | Disposition: A | Discharge status: Home / Self Care | Payer: Commercial Managed Care - PPO | Source: Ambulatory Visit | Attending: Nephrology | Admitting: Nephrology

## 2019-03-30 ENCOUNTER — Other Ambulatory Visit: Payer: Self-pay

## 2019-03-30 VITALS — BP 148/76 | HR 78 | Temp 96.7°F | Resp 18

## 2019-03-30 DIAGNOSIS — N179 Acute kidney failure, unspecified: Secondary | ICD-10-CM | POA: Diagnosis present

## 2019-03-30 DIAGNOSIS — N184 Chronic kidney disease, stage 4 (severe): Secondary | ICD-10-CM | POA: Insufficient documentation

## 2019-03-30 LAB — IRON AND TIBC
Iron: 93 ug/dL (ref 28–170)
Saturation Ratios: 35 % — ABNORMAL HIGH (ref 10.4–31.8)
TIBC: 266 ug/dL (ref 250–450)
UIBC: 173 ug/dL

## 2019-03-30 LAB — RENAL FUNCTION PANEL
Albumin: 3.8 g/dL (ref 3.5–5.0)
Anion gap: 12 (ref 5–15)
BUN: 57 mg/dL — ABNORMAL HIGH (ref 6–20)
CO2: 24 mmol/L (ref 22–32)
Calcium: 9.8 mg/dL (ref 8.9–10.3)
Chloride: 95 mmol/L — ABNORMAL LOW (ref 98–111)
Creatinine, Ser: 3.04 mg/dL — ABNORMAL HIGH (ref 0.44–1.00)
GFR calc Af Amer: 19 mL/min — ABNORMAL LOW (ref >60)
GFR calc non Af Amer: 17 mL/min — ABNORMAL LOW (ref >60)
Glucose, Bld: 332 mg/dL — ABNORMAL HIGH (ref 70–99)
Phosphorus: 4 mg/dL (ref 2.5–4.6)
Potassium: 4.5 mmol/L (ref 3.5–5.1)
Sodium: 131 mmol/L — ABNORMAL LOW (ref 135–145)

## 2019-03-30 LAB — POCT HEMOGLOBIN-HEMACUE: Hemoglobin: 10.2 g/dL — ABNORMAL LOW (ref 12.0–15.0)

## 2019-03-30 LAB — FERRITIN: Ferritin: 649 ng/mL — ABNORMAL HIGH (ref 11–307)

## 2019-03-30 MED ORDER — EPOETIN ALFA 40000 UNIT/ML IJ SOLN
40000.0000 [IU] | INTRAMUSCULAR | Status: DC
  Administered 2019-03-30: 40000 [IU] via SUBCUTANEOUS

## 2019-03-30 MED ORDER — EPOETIN ALFA 40000 UNIT/ML IJ SOLN
INTRAMUSCULAR | Status: AC
  Filled 2019-03-30: qty 1

## 2019-03-31 LAB — PTH, INTACT AND CALCIUM
Calcium, Total (PTH): 9.7 mg/dL (ref 8.7–10.2)
PTH: 67 pg/mL — ABNORMAL HIGH (ref 15–65)

## 2019-04-13 ENCOUNTER — Encounter (HOSPITAL_COMMUNITY): Payer: Commercial Managed Care - PPO

## 2019-04-20 ENCOUNTER — Inpatient Hospital Stay (HOSPITAL_COMMUNITY)
Admission: RE | Admit: 2019-04-20 | Discharge: 2019-04-20 | Disposition: A | Discharge status: Home / Self Care | Payer: Commercial Managed Care - PPO | Source: Ambulatory Visit | Attending: Nephrology | Admitting: Nephrology

## 2019-04-20 NOTE — Progress Notes (Signed)
Patient was a no show for 1:00 appt.  Called at 1:50 saying that she just woke up and wanted to know if she could come on in.  No appts available at this point today.  Pt rescheduled

## 2019-04-28 ENCOUNTER — Encounter (HOSPITAL_COMMUNITY)
Admission: RE | Admit: 2019-04-28 | Discharge: 2019-04-28 | Disposition: A | Discharge status: Home / Self Care | Payer: Commercial Managed Care - PPO | Source: Ambulatory Visit | Attending: Nephrology | Admitting: Nephrology

## 2019-04-28 ENCOUNTER — Other Ambulatory Visit: Payer: Self-pay

## 2019-04-28 VITALS — BP 167/82 | HR 81 | Temp 96.0°F | Resp 18

## 2019-04-28 DIAGNOSIS — N184 Chronic kidney disease, stage 4 (severe): Secondary | ICD-10-CM | POA: Insufficient documentation

## 2019-04-28 DIAGNOSIS — N179 Acute kidney failure, unspecified: Secondary | ICD-10-CM | POA: Diagnosis not present

## 2019-04-28 LAB — IRON AND TIBC
Iron: 97 ug/dL (ref 28–170)
Saturation Ratios: 38 % — ABNORMAL HIGH (ref 10.4–31.8)
TIBC: 252 ug/dL (ref 250–450)
UIBC: 155 ug/dL

## 2019-04-28 LAB — RENAL FUNCTION PANEL
Albumin: 3.8 g/dL (ref 3.5–5.0)
Anion gap: 11 (ref 5–15)
BUN: 62 mg/dL — ABNORMAL HIGH (ref 6–20)
CO2: 24 mmol/L (ref 22–32)
Calcium: 10.2 mg/dL (ref 8.9–10.3)
Chloride: 100 mmol/L (ref 98–111)
Creatinine, Ser: 2.81 mg/dL — ABNORMAL HIGH (ref 0.44–1.00)
GFR calc Af Amer: 21 mL/min — ABNORMAL LOW (ref >60)
GFR calc non Af Amer: 18 mL/min — ABNORMAL LOW (ref >60)
Glucose, Bld: 373 mg/dL — ABNORMAL HIGH (ref 70–99)
Phosphorus: 4.9 mg/dL — ABNORMAL HIGH (ref 2.5–4.6)
Potassium: 4.8 mmol/L (ref 3.5–5.1)
Sodium: 135 mmol/L (ref 135–145)

## 2019-04-28 LAB — FERRITIN: Ferritin: 698 ng/mL — ABNORMAL HIGH (ref 11–307)

## 2019-04-28 LAB — POCT HEMOGLOBIN-HEMACUE: Hemoglobin: 12.2 g/dL (ref 12.0–15.0)

## 2019-04-28 MED ORDER — EPOETIN ALFA 40000 UNIT/ML IJ SOLN: 40000.0000 [IU] | INTRAMUSCULAR | Status: DC

## 2019-04-29 LAB — PTH, INTACT AND CALCIUM
Calcium, Total (PTH): 10.1 mg/dL (ref 8.7–10.2)
PTH: 134 pg/mL — ABNORMAL HIGH (ref 15–65)

## 2019-05-11 ENCOUNTER — Encounter (HOSPITAL_COMMUNITY)
Admission: RE | Admit: 2019-05-11 | Discharge: 2019-05-11 | Disposition: A | Discharge status: Home / Self Care | Payer: Commercial Managed Care - PPO | Source: Ambulatory Visit | Attending: Nephrology | Admitting: Nephrology

## 2019-05-11 VITALS — BP 158/81 | HR 77 | Temp 96.7°F | Resp 18

## 2019-05-11 DIAGNOSIS — N179 Acute kidney failure, unspecified: Secondary | ICD-10-CM | POA: Diagnosis not present

## 2019-05-11 LAB — RENAL FUNCTION PANEL
Albumin: 4 g/dL (ref 3.5–5.0)
Anion gap: 15 (ref 5–15)
BUN: 76 mg/dL — ABNORMAL HIGH (ref 6–20)
CO2: 23 mmol/L (ref 22–32)
Calcium: 10.3 mg/dL (ref 8.9–10.3)
Chloride: 99 mmol/L (ref 98–111)
Creatinine, Ser: 2.87 mg/dL — ABNORMAL HIGH (ref 0.44–1.00)
GFR calc Af Amer: 20 mL/min — ABNORMAL LOW (ref >60)
GFR calc non Af Amer: 18 mL/min — ABNORMAL LOW (ref >60)
Glucose, Bld: 240 mg/dL — ABNORMAL HIGH (ref 70–99)
Phosphorus: 5 mg/dL — ABNORMAL HIGH (ref 2.5–4.6)
Potassium: 5 mmol/L (ref 3.5–5.1)
Sodium: 137 mmol/L (ref 135–145)

## 2019-05-11 LAB — POCT HEMOGLOBIN-HEMACUE: Hemoglobin: 12.3 g/dL (ref 12.0–15.0)

## 2019-05-11 MED ORDER — EPOETIN ALFA 40000 UNIT/ML IJ SOLN: 40000.0000 [IU] | INTRAMUSCULAR | Status: DC

## 2019-05-12 ENCOUNTER — Inpatient Hospital Stay (HOSPITAL_COMMUNITY): Admission: RE | Admit: 2019-05-12 | Payer: Commercial Managed Care - PPO | Source: Ambulatory Visit

## 2019-05-25 ENCOUNTER — Encounter (HOSPITAL_COMMUNITY): Payer: Commercial Managed Care - PPO

## 2019-06-02 ENCOUNTER — Encounter (HOSPITAL_COMMUNITY)
Admission: RE | Admit: 2019-06-02 | Discharge: 2019-06-02 | Disposition: A | Discharge status: Home / Self Care | Payer: Commercial Managed Care - PPO | Source: Ambulatory Visit | Attending: Nephrology | Admitting: Nephrology

## 2019-06-02 ENCOUNTER — Other Ambulatory Visit: Payer: Self-pay

## 2019-06-02 VITALS — BP 152/74 | HR 73

## 2019-06-02 DIAGNOSIS — N184 Chronic kidney disease, stage 4 (severe): Secondary | ICD-10-CM | POA: Insufficient documentation

## 2019-06-02 DIAGNOSIS — N179 Acute kidney failure, unspecified: Secondary | ICD-10-CM | POA: Diagnosis not present

## 2019-06-02 LAB — RENAL FUNCTION PANEL
Albumin: 3.9 g/dL (ref 3.5–5.0)
Anion gap: 13 (ref 5–15)
BUN: 98 mg/dL — ABNORMAL HIGH (ref 6–20)
CO2: 21 mmol/L — ABNORMAL LOW (ref 22–32)
Calcium: 10.2 mg/dL (ref 8.9–10.3)
Chloride: 100 mmol/L (ref 98–111)
Creatinine, Ser: 3.62 mg/dL — ABNORMAL HIGH (ref 0.44–1.00)
GFR calc Af Amer: 15 mL/min — ABNORMAL LOW (ref >60)
GFR calc non Af Amer: 13 mL/min — ABNORMAL LOW (ref >60)
Glucose, Bld: 200 mg/dL — ABNORMAL HIGH (ref 70–99)
Phosphorus: 5.9 mg/dL — ABNORMAL HIGH (ref 2.5–4.6)
Potassium: 5.4 mmol/L — ABNORMAL HIGH (ref 3.5–5.1)
Sodium: 134 mmol/L — ABNORMAL LOW (ref 135–145)

## 2019-06-02 LAB — IRON AND TIBC
Iron: 118 ug/dL (ref 28–170)
Saturation Ratios: 45 % — ABNORMAL HIGH (ref 10.4–31.8)
TIBC: 265 ug/dL (ref 250–450)
UIBC: 147 ug/dL

## 2019-06-02 LAB — FERRITIN: Ferritin: 800 ng/mL — ABNORMAL HIGH (ref 11–307)

## 2019-06-02 LAB — POCT HEMOGLOBIN-HEMACUE: Hemoglobin: 11.4 g/dL — ABNORMAL LOW (ref 12.0–15.0)

## 2019-06-02 MED ORDER — EPOETIN ALFA 40000 UNIT/ML IJ SOLN: 40000.0000 [IU] | INTRAMUSCULAR | Status: DC

## 2019-06-02 MED ORDER — EPOETIN ALFA 40000 UNIT/ML IJ SOLN
INTRAMUSCULAR | Status: AC
  Administered 2019-06-02: 40000 [IU] via SUBCUTANEOUS
  Filled 2019-06-02: qty 1

## 2019-06-03 LAB — PTH, INTACT AND CALCIUM
Calcium, Total (PTH): 10.4 mg/dL — ABNORMAL HIGH (ref 8.7–10.2)
PTH: 111 pg/mL — ABNORMAL HIGH (ref 15–65)

## 2019-06-21 ENCOUNTER — Encounter (HOSPITAL_COMMUNITY)
Admission: RE | Admit: 2019-06-21 | Discharge: 2019-06-21 | Disposition: A | Discharge status: Home / Self Care | Payer: Commercial Managed Care - PPO | Source: Ambulatory Visit | Attending: Nephrology | Admitting: Nephrology

## 2019-06-21 ENCOUNTER — Other Ambulatory Visit: Payer: Self-pay

## 2019-06-21 VITALS — BP 172/87 | HR 81 | Temp 96.5°F | Resp 18

## 2019-06-21 DIAGNOSIS — N179 Acute kidney failure, unspecified: Secondary | ICD-10-CM | POA: Diagnosis not present

## 2019-06-21 LAB — POCT HEMOGLOBIN-HEMACUE: Hemoglobin: 11.6 g/dL — ABNORMAL LOW (ref 12.0–15.0)

## 2019-06-21 LAB — IRON AND TIBC
Iron: 74 ug/dL (ref 28–170)
Saturation Ratios: 30 % (ref 10.4–31.8)
TIBC: 249 ug/dL — ABNORMAL LOW (ref 250–450)
UIBC: 175 ug/dL

## 2019-06-21 LAB — RENAL FUNCTION PANEL
Albumin: 3.8 g/dL (ref 3.5–5.0)
Anion gap: 14 (ref 5–15)
BUN: 76 mg/dL — ABNORMAL HIGH (ref 6–20)
CO2: 22 mmol/L (ref 22–32)
Calcium: 10 mg/dL (ref 8.9–10.3)
Chloride: 99 mmol/L (ref 98–111)
Creatinine, Ser: 3.14 mg/dL — ABNORMAL HIGH (ref 0.44–1.00)
GFR calc Af Amer: 18 mL/min — ABNORMAL LOW (ref >60)
GFR calc non Af Amer: 16 mL/min — ABNORMAL LOW (ref >60)
Glucose, Bld: 190 mg/dL — ABNORMAL HIGH (ref 70–99)
Phosphorus: 5.7 mg/dL — ABNORMAL HIGH (ref 2.5–4.6)
Potassium: 4.7 mmol/L (ref 3.5–5.1)
Sodium: 135 mmol/L (ref 135–145)

## 2019-06-21 MED ORDER — EPOETIN ALFA 40000 UNIT/ML IJ SOLN
INTRAMUSCULAR | Status: AC
  Filled 2019-06-21: qty 1

## 2019-06-21 MED ORDER — EPOETIN ALFA 40000 UNIT/ML IJ SOLN
40000.0000 [IU] | INTRAMUSCULAR | Status: DC
  Administered 2019-06-21: 40000 [IU] via SUBCUTANEOUS

## 2019-07-19 ENCOUNTER — Encounter (HOSPITAL_COMMUNITY): Payer: Commercial Managed Care - PPO

## 2019-07-28 ENCOUNTER — Encounter (HOSPITAL_COMMUNITY)
Admission: RE | Admit: 2019-07-28 | Discharge: 2019-07-28 | Disposition: A | Discharge status: Home / Self Care | Payer: Commercial Managed Care - PPO | Source: Ambulatory Visit | Attending: Nephrology | Admitting: Nephrology

## 2019-07-28 ENCOUNTER — Other Ambulatory Visit: Payer: Self-pay

## 2019-07-28 VITALS — BP 159/77 | HR 87 | Temp 97.4°F | Resp 18

## 2019-07-28 DIAGNOSIS — N179 Acute kidney failure, unspecified: Secondary | ICD-10-CM | POA: Diagnosis not present

## 2019-07-28 DIAGNOSIS — N184 Chronic kidney disease, stage 4 (severe): Secondary | ICD-10-CM | POA: Diagnosis present

## 2019-07-28 LAB — RENAL FUNCTION PANEL
Albumin: 2.7 g/dL — ABNORMAL LOW (ref 3.5–5.0)
Anion gap: 14 (ref 5–15)
BUN: 111 mg/dL — ABNORMAL HIGH (ref 6–20)
CO2: 21 mmol/L — ABNORMAL LOW (ref 22–32)
Calcium: 9.6 mg/dL (ref 8.9–10.3)
Chloride: 92 mmol/L — ABNORMAL LOW (ref 98–111)
Creatinine, Ser: 4.92 mg/dL — ABNORMAL HIGH (ref 0.44–1.00)
GFR calc Af Amer: 11 mL/min — ABNORMAL LOW (ref >60)
GFR calc non Af Amer: 9 mL/min — ABNORMAL LOW (ref >60)
Glucose, Bld: 523 mg/dL (ref 70–99)
Phosphorus: 4.6 mg/dL (ref 2.5–4.6)
Potassium: 4.9 mmol/L (ref 3.5–5.1)
Sodium: 127 mmol/L — ABNORMAL LOW (ref 135–145)

## 2019-07-28 LAB — IRON AND TIBC
Iron: 73 ug/dL (ref 28–170)
Saturation Ratios: 42 % — ABNORMAL HIGH (ref 10.4–31.8)
TIBC: 174 ug/dL — ABNORMAL LOW (ref 250–450)
UIBC: 101 ug/dL

## 2019-07-28 LAB — POCT HEMOGLOBIN-HEMACUE: Hemoglobin: 11.6 g/dL — ABNORMAL LOW (ref 12.0–15.0)

## 2019-07-28 LAB — FERRITIN: Ferritin: 1373 ng/mL — ABNORMAL HIGH (ref 11–307)

## 2019-07-28 MED ORDER — EPOETIN ALFA 40000 UNIT/ML IJ SOLN
INTRAMUSCULAR | Status: AC
  Filled 2019-07-28: qty 1

## 2019-07-28 MED ORDER — EPOETIN ALFA 40000 UNIT/ML IJ SOLN
40000.0000 [IU] | INTRAMUSCULAR | Status: DC
  Administered 2019-07-28: 40000 [IU] via SUBCUTANEOUS

## 2019-07-29 LAB — PTH, INTACT AND CALCIUM
Calcium, Total (PTH): 9.7 mg/dL (ref 8.7–10.2)
PTH: 116 pg/mL — ABNORMAL HIGH (ref 15–65)

## 2019-08-02 ENCOUNTER — Encounter (HOSPITAL_COMMUNITY): Payer: Commercial Managed Care - PPO

## 2019-08-14 ENCOUNTER — Other Ambulatory Visit: Payer: Self-pay | Admitting: Nurse Practitioner

## 2019-08-14 DIAGNOSIS — Z1231 Encounter for screening mammogram for malignant neoplasm of breast: Secondary | ICD-10-CM

## 2019-08-24 ENCOUNTER — Other Ambulatory Visit: Payer: Self-pay

## 2019-08-24 ENCOUNTER — Encounter (HOSPITAL_COMMUNITY)
Admission: RE | Admit: 2019-08-24 | Discharge: 2019-08-24 | Disposition: A | Discharge status: Home / Self Care | Payer: Commercial Managed Care - PPO | Source: Ambulatory Visit | Attending: Nephrology | Admitting: Nephrology

## 2019-08-24 VITALS — BP 131/69 | HR 81 | Temp 96.9°F | Resp 18

## 2019-08-24 DIAGNOSIS — N179 Acute kidney failure, unspecified: Secondary | ICD-10-CM

## 2019-08-24 DIAGNOSIS — N184 Chronic kidney disease, stage 4 (severe): Secondary | ICD-10-CM

## 2019-08-24 LAB — RENAL FUNCTION PANEL
Albumin: 3.9 g/dL (ref 3.5–5.0)
Anion gap: 14 (ref 5–15)
BUN: 83 mg/dL — ABNORMAL HIGH (ref 6–20)
CO2: 19 mmol/L — ABNORMAL LOW (ref 22–32)
Calcium: 10.1 mg/dL (ref 8.9–10.3)
Chloride: 100 mmol/L (ref 98–111)
Creatinine, Ser: 3.06 mg/dL — ABNORMAL HIGH (ref 0.44–1.00)
GFR calc Af Amer: 19 mL/min — ABNORMAL LOW (ref >60)
GFR calc non Af Amer: 16 mL/min — ABNORMAL LOW (ref >60)
Glucose, Bld: 205 mg/dL — ABNORMAL HIGH (ref 70–99)
Phosphorus: 5.3 mg/dL — ABNORMAL HIGH (ref 2.5–4.6)
Potassium: 4.4 mmol/L (ref 3.5–5.1)
Sodium: 133 mmol/L — ABNORMAL LOW (ref 135–145)

## 2019-08-24 LAB — FERRITIN: Ferritin: 659 ng/mL — ABNORMAL HIGH (ref 11–307)

## 2019-08-24 LAB — IRON AND TIBC
Iron: 76 ug/dL (ref 28–170)
Saturation Ratios: 30 % (ref 10.4–31.8)
TIBC: 258 ug/dL (ref 250–450)
UIBC: 182 ug/dL

## 2019-08-24 LAB — POCT HEMOGLOBIN-HEMACUE: Hemoglobin: 11.5 g/dL — ABNORMAL LOW (ref 12.0–15.0)

## 2019-08-24 MED ORDER — EPOETIN ALFA 40000 UNIT/ML IJ SOLN: 40000.0000 [IU] | INTRAMUSCULAR | Status: DC

## 2019-08-24 MED ORDER — EPOETIN ALFA 40000 UNIT/ML IJ SOLN
INTRAMUSCULAR | Status: AC
  Administered 2019-08-24: 40000 [IU] via SUBCUTANEOUS
  Filled 2019-08-24: qty 1

## 2019-09-21 ENCOUNTER — Encounter (HOSPITAL_COMMUNITY): Payer: Commercial Managed Care - PPO

## 2019-10-06 ENCOUNTER — Encounter (HOSPITAL_COMMUNITY)
Admission: RE | Admit: 2019-10-06 | Discharge: 2019-10-06 | Disposition: A | Discharge status: Home / Self Care | Payer: Commercial Managed Care - PPO | Source: Ambulatory Visit | Attending: Nephrology | Admitting: Nephrology

## 2019-10-06 ENCOUNTER — Other Ambulatory Visit: Payer: Self-pay

## 2019-10-06 VITALS — BP 124/69 | HR 85 | Temp 97.4°F

## 2019-10-06 DIAGNOSIS — N179 Acute kidney failure, unspecified: Secondary | ICD-10-CM

## 2019-10-06 DIAGNOSIS — N184 Chronic kidney disease, stage 4 (severe): Secondary | ICD-10-CM | POA: Insufficient documentation

## 2019-10-06 LAB — IRON AND TIBC
Iron: 85 ug/dL (ref 28–170)
Saturation Ratios: 33 % — ABNORMAL HIGH (ref 10.4–31.8)
TIBC: 256 ug/dL (ref 250–450)
UIBC: 171 ug/dL

## 2019-10-06 LAB — RENAL FUNCTION PANEL
Albumin: 3.9 g/dL (ref 3.5–5.0)
Anion gap: 13 (ref 5–15)
BUN: 96 mg/dL — ABNORMAL HIGH (ref 6–20)
CO2: 23 mmol/L (ref 22–32)
Calcium: 10.3 mg/dL (ref 8.9–10.3)
Chloride: 99 mmol/L (ref 98–111)
Creatinine, Ser: 3.17 mg/dL — ABNORMAL HIGH (ref 0.44–1.00)
GFR calc Af Amer: 18 mL/min — ABNORMAL LOW (ref >60)
GFR calc non Af Amer: 16 mL/min — ABNORMAL LOW (ref >60)
Glucose, Bld: 188 mg/dL — ABNORMAL HIGH (ref 70–99)
Phosphorus: 4.8 mg/dL — ABNORMAL HIGH (ref 2.5–4.6)
Potassium: 4.1 mmol/L (ref 3.5–5.1)
Sodium: 135 mmol/L (ref 135–145)

## 2019-10-06 LAB — FERRITIN: Ferritin: 852 ng/mL — ABNORMAL HIGH (ref 11–307)

## 2019-10-06 LAB — POCT HEMOGLOBIN-HEMACUE: Hemoglobin: 12 g/dL (ref 12.0–15.0)

## 2019-10-06 MED ORDER — EPOETIN ALFA 40000 UNIT/ML IJ SOLN: 40000.0000 [IU] | INTRAMUSCULAR | Status: DC

## 2019-10-07 LAB — PTH, INTACT AND CALCIUM
Calcium, Total (PTH): 10.5 mg/dL — ABNORMAL HIGH (ref 8.7–10.2)
PTH: 154 pg/mL — ABNORMAL HIGH (ref 15–65)

## 2019-11-03 ENCOUNTER — Encounter (HOSPITAL_COMMUNITY)
Admission: RE | Admit: 2019-11-03 | Discharge: 2019-11-03 | Disposition: A | Discharge status: Home / Self Care | Payer: Commercial Managed Care - PPO | Source: Ambulatory Visit | Attending: Nephrology | Admitting: Nephrology

## 2019-11-03 ENCOUNTER — Other Ambulatory Visit: Payer: Self-pay

## 2019-11-03 VITALS — BP 140/69 | HR 80 | Temp 97.4°F | Resp 18

## 2019-11-03 DIAGNOSIS — N179 Acute kidney failure, unspecified: Secondary | ICD-10-CM | POA: Insufficient documentation

## 2019-11-03 DIAGNOSIS — N184 Chronic kidney disease, stage 4 (severe): Secondary | ICD-10-CM | POA: Diagnosis present

## 2019-11-03 LAB — FERRITIN: Ferritin: 770 ng/mL — ABNORMAL HIGH (ref 11–307)

## 2019-11-03 LAB — IRON AND TIBC
Iron: 98 ug/dL (ref 28–170)
Saturation Ratios: 39 % — ABNORMAL HIGH (ref 10.4–31.8)
TIBC: 251 ug/dL (ref 250–450)
UIBC: 153 ug/dL

## 2019-11-03 LAB — RENAL FUNCTION PANEL
Albumin: 3.8 g/dL (ref 3.5–5.0)
Anion gap: 12 (ref 5–15)
BUN: 78 mg/dL — ABNORMAL HIGH (ref 6–20)
CO2: 23 mmol/L (ref 22–32)
Calcium: 10 mg/dL (ref 8.9–10.3)
Chloride: 99 mmol/L (ref 98–111)
Creatinine, Ser: 3.14 mg/dL — ABNORMAL HIGH (ref 0.44–1.00)
GFR, Estimated: 16 mL/min — ABNORMAL LOW (ref >60)
Glucose, Bld: 329 mg/dL — ABNORMAL HIGH (ref 70–99)
Phosphorus: 4.2 mg/dL (ref 2.5–4.6)
Potassium: 4.3 mmol/L (ref 3.5–5.1)
Sodium: 134 mmol/L — ABNORMAL LOW (ref 135–145)

## 2019-11-03 LAB — POCT HEMOGLOBIN-HEMACUE: Hemoglobin: 10.2 g/dL — ABNORMAL LOW (ref 12.0–15.0)

## 2019-11-03 MED ORDER — EPOETIN ALFA 40000 UNIT/ML IJ SOLN: 40000.0000 [IU] | INTRAMUSCULAR | Status: DC

## 2019-11-03 MED ORDER — EPOETIN ALFA 40000 UNIT/ML IJ SOLN
INTRAMUSCULAR | Status: AC
  Administered 2019-11-03: 40000 [IU] via SUBCUTANEOUS
  Filled 2019-11-03: qty 1

## 2019-11-04 LAB — PTH, INTACT AND CALCIUM
Calcium, Total (PTH): 9.9 mg/dL (ref 8.7–10.2)
PTH: 236 pg/mL — ABNORMAL HIGH (ref 15–65)

## 2019-11-30 ENCOUNTER — Inpatient Hospital Stay (HOSPITAL_COMMUNITY): Admission: RE | Admit: 2019-11-30 | Payer: Commercial Managed Care - PPO | Source: Ambulatory Visit

## 2019-12-07 ENCOUNTER — Encounter (HOSPITAL_COMMUNITY): Payer: Commercial Managed Care - PPO

## 2019-12-13 ENCOUNTER — Encounter (HOSPITAL_COMMUNITY)
Admission: RE | Admit: 2019-12-13 | Discharge: 2019-12-13 | Disposition: A | Discharge status: Home / Self Care | Payer: Commercial Managed Care - PPO | Source: Ambulatory Visit | Attending: Nephrology | Admitting: Nephrology

## 2019-12-13 ENCOUNTER — Other Ambulatory Visit: Payer: Self-pay

## 2019-12-13 VITALS — BP 162/80 | HR 80 | Temp 97.3°F | Resp 18

## 2019-12-13 DIAGNOSIS — N179 Acute kidney failure, unspecified: Secondary | ICD-10-CM | POA: Diagnosis not present

## 2019-12-13 DIAGNOSIS — N184 Chronic kidney disease, stage 4 (severe): Secondary | ICD-10-CM

## 2019-12-13 LAB — POCT HEMOGLOBIN-HEMACUE: Hemoglobin: 11.3 g/dL — ABNORMAL LOW (ref 12.0–15.0)

## 2019-12-13 LAB — RENAL FUNCTION PANEL
Albumin: 3.9 g/dL (ref 3.5–5.0)
Anion gap: 12 (ref 5–15)
BUN: 94 mg/dL — ABNORMAL HIGH (ref 6–20)
CO2: 20 mmol/L — ABNORMAL LOW (ref 22–32)
Calcium: 10.5 mg/dL — ABNORMAL HIGH (ref 8.9–10.3)
Chloride: 100 mmol/L (ref 98–111)
Creatinine, Ser: 3.2 mg/dL — ABNORMAL HIGH (ref 0.44–1.00)
GFR, Estimated: 16 mL/min — ABNORMAL LOW (ref >60)
Glucose, Bld: 347 mg/dL — ABNORMAL HIGH (ref 70–99)
Phosphorus: 4.8 mg/dL — ABNORMAL HIGH (ref 2.5–4.6)
Potassium: 4.6 mmol/L (ref 3.5–5.1)
Sodium: 132 mmol/L — ABNORMAL LOW (ref 135–145)

## 2019-12-13 LAB — IRON AND TIBC
Iron: 122 ug/dL (ref 28–170)
Saturation Ratios: 49 % — ABNORMAL HIGH (ref 10.4–31.8)
TIBC: 248 ug/dL — ABNORMAL LOW (ref 250–450)
UIBC: 126 ug/dL

## 2019-12-13 LAB — FERRITIN: Ferritin: 809 ng/mL — ABNORMAL HIGH (ref 11–307)

## 2019-12-13 MED ORDER — EPOETIN ALFA 40000 UNIT/ML IJ SOLN
INTRAMUSCULAR | Status: AC
  Administered 2019-12-13: 40000 [IU] via SUBCUTANEOUS
  Filled 2019-12-13: qty 1

## 2019-12-13 MED ORDER — EPOETIN ALFA 40000 UNIT/ML IJ SOLN: 40000.0000 [IU] | INTRAMUSCULAR | Status: DC

## 2019-12-14 LAB — PTH, INTACT AND CALCIUM
Calcium, Total (PTH): 10.2 mg/dL (ref 8.7–10.2)
PTH: 129 pg/mL — ABNORMAL HIGH (ref 15–65)

## 2019-12-28 ENCOUNTER — Encounter (HOSPITAL_COMMUNITY): Payer: Commercial Managed Care - PPO

## 2020-01-10 ENCOUNTER — Encounter (HOSPITAL_COMMUNITY): Payer: Commercial Managed Care - PPO

## 2020-02-02 ENCOUNTER — Ambulatory Visit (HOSPITAL_COMMUNITY)
Admission: RE | Admit: 2020-02-02 | Discharge: 2020-02-02 | Disposition: A | Discharge status: Home / Self Care | Payer: Commercial Managed Care - PPO | Source: Ambulatory Visit | Attending: Nephrology | Admitting: Nephrology

## 2020-02-02 VITALS — BP 177/84 | HR 83 | Resp 18

## 2020-02-02 DIAGNOSIS — N184 Chronic kidney disease, stage 4 (severe): Secondary | ICD-10-CM

## 2020-02-02 DIAGNOSIS — N179 Acute kidney failure, unspecified: Secondary | ICD-10-CM | POA: Insufficient documentation

## 2020-02-02 LAB — RENAL FUNCTION PANEL
Albumin: 4 g/dL (ref 3.5–5.0)
Anion gap: 10 (ref 5–15)
BUN: 93 mg/dL — ABNORMAL HIGH (ref 6–20)
CO2: 22 mmol/L (ref 22–32)
Calcium: 10.4 mg/dL — ABNORMAL HIGH (ref 8.9–10.3)
Chloride: 103 mmol/L (ref 98–111)
Creatinine, Ser: 3.19 mg/dL — ABNORMAL HIGH (ref 0.44–1.00)
GFR, Estimated: 16 mL/min — ABNORMAL LOW (ref >60)
Glucose, Bld: 211 mg/dL — ABNORMAL HIGH (ref 70–99)
Phosphorus: 4 mg/dL (ref 2.5–4.6)
Potassium: 5.2 mmol/L — ABNORMAL HIGH (ref 3.5–5.1)
Sodium: 135 mmol/L (ref 135–145)

## 2020-02-02 LAB — FERRITIN: Ferritin: 750 ng/mL — ABNORMAL HIGH (ref 11–307)

## 2020-02-02 LAB — IRON AND TIBC
Iron: 115 ug/dL (ref 28–170)
Saturation Ratios: 43 % — ABNORMAL HIGH (ref 10.4–31.8)
TIBC: 267 ug/dL (ref 250–450)
UIBC: 152 ug/dL

## 2020-02-02 LAB — POCT HEMOGLOBIN-HEMACUE: Hemoglobin: 11.3 g/dL — ABNORMAL LOW (ref 12.0–15.0)

## 2020-02-02 MED ORDER — EPOETIN ALFA 40000 UNIT/ML IJ SOLN
INTRAMUSCULAR | Status: AC
  Filled 2020-02-02: qty 1

## 2020-02-02 MED ORDER — EPOETIN ALFA 40000 UNIT/ML IJ SOLN
40000.0000 [IU] | INTRAMUSCULAR | Status: DC
  Administered 2020-02-02: 40000 [IU] via SUBCUTANEOUS

## 2020-02-03 LAB — PTH, INTACT AND CALCIUM
Calcium, Total (PTH): 10.2 mg/dL (ref 8.7–10.2)
PTH: 101 pg/mL — ABNORMAL HIGH (ref 15–65)

## 2020-03-01 ENCOUNTER — Encounter (HOSPITAL_COMMUNITY): Payer: Commercial Managed Care - PPO

## 2020-03-12 ENCOUNTER — Other Ambulatory Visit: Payer: Self-pay

## 2020-03-12 ENCOUNTER — Ambulatory Visit (HOSPITAL_COMMUNITY)
Admission: RE | Admit: 2020-03-12 | Discharge: 2020-03-12 | Disposition: A | Discharge status: Home / Self Care | Payer: Commercial Managed Care - PPO | Source: Ambulatory Visit | Attending: Nephrology | Admitting: Nephrology

## 2020-03-12 VITALS — BP 155/83 | HR 80 | Temp 97.7°F | Resp 18

## 2020-03-12 DIAGNOSIS — N179 Acute kidney failure, unspecified: Secondary | ICD-10-CM

## 2020-03-12 DIAGNOSIS — N184 Chronic kidney disease, stage 4 (severe): Secondary | ICD-10-CM | POA: Diagnosis present

## 2020-03-12 LAB — RENAL FUNCTION PANEL
Albumin: 4 g/dL (ref 3.5–5.0)
Anion gap: 12 (ref 5–15)
BUN: 84 mg/dL — ABNORMAL HIGH (ref 6–20)
CO2: 22 mmol/L (ref 22–32)
Calcium: 10.5 mg/dL — ABNORMAL HIGH (ref 8.9–10.3)
Chloride: 101 mmol/L (ref 98–111)
Creatinine, Ser: 3.52 mg/dL — ABNORMAL HIGH (ref 0.44–1.00)
GFR, Estimated: 15 mL/min — ABNORMAL LOW (ref >60)
Glucose, Bld: 186 mg/dL — ABNORMAL HIGH (ref 70–99)
Phosphorus: 4.9 mg/dL — ABNORMAL HIGH (ref 2.5–4.6)
Potassium: 5.3 mmol/L — ABNORMAL HIGH (ref 3.5–5.1)
Sodium: 135 mmol/L (ref 135–145)

## 2020-03-12 LAB — IRON AND TIBC
Iron: 102 ug/dL (ref 28–170)
Saturation Ratios: 40 % — ABNORMAL HIGH (ref 10.4–31.8)
TIBC: 252 ug/dL (ref 250–450)
UIBC: 150 ug/dL

## 2020-03-12 LAB — FERRITIN: Ferritin: 751 ng/mL — ABNORMAL HIGH (ref 11–307)

## 2020-03-12 LAB — POCT HEMOGLOBIN-HEMACUE: Hemoglobin: 11.6 g/dL — ABNORMAL LOW (ref 12.0–15.0)

## 2020-03-12 MED ORDER — EPOETIN ALFA 40000 UNIT/ML IJ SOLN: 40000.0000 [IU] | INTRAMUSCULAR | Status: DC

## 2020-03-12 MED ORDER — EPOETIN ALFA 40000 UNIT/ML IJ SOLN
INTRAMUSCULAR | Status: AC
  Administered 2020-03-12: 40000 [IU] via SUBCUTANEOUS
  Filled 2020-03-12: qty 1

## 2020-03-13 LAB — PTH, INTACT AND CALCIUM
Calcium, Total (PTH): 10.4 mg/dL — ABNORMAL HIGH (ref 8.7–10.2)
PTH: 143 pg/mL — ABNORMAL HIGH (ref 15–65)

## 2020-04-09 ENCOUNTER — Encounter (HOSPITAL_COMMUNITY): Payer: Commercial Managed Care - PPO

## 2020-04-16 ENCOUNTER — Inpatient Hospital Stay (HOSPITAL_COMMUNITY)
Admission: RE | Admit: 2020-04-16 | Discharge: 2020-04-16 | Disposition: A | Discharge status: Home / Self Care | Payer: Commercial Managed Care - PPO | Source: Ambulatory Visit | Attending: Nephrology | Admitting: Nephrology

## 2020-04-16 NOTE — Progress Notes (Signed)
Pt was a no show/no call for injection appt

## 2020-05-03 ENCOUNTER — Ambulatory Visit (HOSPITAL_COMMUNITY)
Admission: RE | Admit: 2020-05-03 | Discharge: 2020-05-03 | Disposition: A | Discharge status: Home / Self Care | Payer: Commercial Managed Care - PPO | Source: Ambulatory Visit | Attending: Nephrology | Admitting: Nephrology

## 2020-05-03 ENCOUNTER — Other Ambulatory Visit: Payer: Self-pay

## 2020-05-03 VITALS — BP 155/82 | HR 83 | Resp 18

## 2020-05-03 DIAGNOSIS — N184 Chronic kidney disease, stage 4 (severe): Secondary | ICD-10-CM

## 2020-05-03 DIAGNOSIS — N179 Acute kidney failure, unspecified: Secondary | ICD-10-CM | POA: Insufficient documentation

## 2020-05-03 LAB — RENAL FUNCTION PANEL
Albumin: 3.8 g/dL (ref 3.5–5.0)
Anion gap: 9 (ref 5–15)
BUN: 91 mg/dL — ABNORMAL HIGH (ref 6–20)
CO2: 23 mmol/L (ref 22–32)
Calcium: 10.3 mg/dL (ref 8.9–10.3)
Chloride: 102 mmol/L (ref 98–111)
Creatinine, Ser: 3.39 mg/dL — ABNORMAL HIGH (ref 0.44–1.00)
GFR, Estimated: 15 mL/min — ABNORMAL LOW (ref >60)
Glucose, Bld: 296 mg/dL — ABNORMAL HIGH (ref 70–99)
Phosphorus: 5.4 mg/dL — ABNORMAL HIGH (ref 2.5–4.6)
Potassium: 5.2 mmol/L — ABNORMAL HIGH (ref 3.5–5.1)
Sodium: 134 mmol/L — ABNORMAL LOW (ref 135–145)

## 2020-05-03 LAB — IRON AND TIBC
Iron: 93 ug/dL (ref 28–170)
Saturation Ratios: 36 % — ABNORMAL HIGH (ref 10.4–31.8)
TIBC: 255 ug/dL (ref 250–450)
UIBC: 162 ug/dL

## 2020-05-03 LAB — POCT HEMOGLOBIN-HEMACUE: Hemoglobin: 11.5 g/dL — ABNORMAL LOW (ref 12.0–15.0)

## 2020-05-03 LAB — FERRITIN: Ferritin: 758 ng/mL — ABNORMAL HIGH (ref 11–307)

## 2020-05-03 MED ORDER — EPOETIN ALFA 40000 UNIT/ML IJ SOLN
INTRAMUSCULAR | Status: AC
  Filled 2020-05-03: qty 1

## 2020-05-03 MED ORDER — EPOETIN ALFA 40000 UNIT/ML IJ SOLN
40000.0000 [IU] | INTRAMUSCULAR | Status: DC
  Administered 2020-05-03: 40000 [IU] via SUBCUTANEOUS

## 2020-05-04 LAB — PTH, INTACT AND CALCIUM
Calcium, Total (PTH): 10.3 mg/dL — ABNORMAL HIGH (ref 8.7–10.2)
PTH: 259 pg/mL — ABNORMAL HIGH (ref 15–65)

## 2020-06-03 ENCOUNTER — Encounter (HOSPITAL_COMMUNITY): Payer: Self-pay

## 2020-06-03 ENCOUNTER — Inpatient Hospital Stay (HOSPITAL_COMMUNITY)
Admission: RE | Admit: 2020-06-03 | Discharge: 2020-06-03 | Disposition: A | Discharge status: Home / Self Care | Payer: Commercial Managed Care - PPO | Source: Ambulatory Visit | Attending: Nephrology | Admitting: Nephrology

## 2020-06-14 ENCOUNTER — Encounter (HOSPITAL_COMMUNITY): Payer: Commercial Managed Care - PPO

## 2020-07-02 ENCOUNTER — Ambulatory Visit (HOSPITAL_COMMUNITY)
Admission: RE | Admit: 2020-07-02 | Discharge: 2020-07-02 | Disposition: A | Discharge status: Home / Self Care | Payer: Commercial Managed Care - PPO | Source: Ambulatory Visit | Attending: Nephrology | Admitting: Nephrology

## 2020-07-02 ENCOUNTER — Other Ambulatory Visit: Payer: Self-pay

## 2020-07-02 VITALS — BP 143/76 | HR 85 | Temp 97.3°F | Resp 18

## 2020-07-02 DIAGNOSIS — N179 Acute kidney failure, unspecified: Secondary | ICD-10-CM | POA: Insufficient documentation

## 2020-07-02 DIAGNOSIS — N184 Chronic kidney disease, stage 4 (severe): Secondary | ICD-10-CM | POA: Insufficient documentation

## 2020-07-02 LAB — FERRITIN: Ferritin: 824 ng/mL — ABNORMAL HIGH (ref 11–307)

## 2020-07-02 LAB — RENAL FUNCTION PANEL
Albumin: 3.9 g/dL (ref 3.5–5.0)
Anion gap: 11 (ref 5–15)
BUN: 120 mg/dL — ABNORMAL HIGH (ref 6–20)
CO2: 20 mmol/L — ABNORMAL LOW (ref 22–32)
Calcium: 10.3 mg/dL (ref 8.9–10.3)
Chloride: 102 mmol/L (ref 98–111)
Creatinine, Ser: 3.47 mg/dL — ABNORMAL HIGH (ref 0.44–1.00)
GFR, Estimated: 15 mL/min — ABNORMAL LOW (ref >60)
Glucose, Bld: 104 mg/dL — ABNORMAL HIGH (ref 70–99)
Phosphorus: 6.4 mg/dL — ABNORMAL HIGH (ref 2.5–4.6)
Potassium: 4.5 mmol/L (ref 3.5–5.1)
Sodium: 133 mmol/L — ABNORMAL LOW (ref 135–145)

## 2020-07-02 LAB — IRON AND TIBC
Iron: 128 ug/dL (ref 28–170)
Saturation Ratios: 48 % — ABNORMAL HIGH (ref 10.4–31.8)
TIBC: 267 ug/dL (ref 250–450)
UIBC: 139 ug/dL

## 2020-07-02 LAB — POCT HEMOGLOBIN-HEMACUE: Hemoglobin: 10.8 g/dL — ABNORMAL LOW (ref 12.0–15.0)

## 2020-07-02 MED ORDER — EPOETIN ALFA 40000 UNIT/ML IJ SOLN
40000.0000 [IU] | INTRAMUSCULAR | Status: DC
  Administered 2020-07-02: 40000 [IU] via SUBCUTANEOUS

## 2020-07-02 MED ORDER — EPOETIN ALFA 40000 UNIT/ML IJ SOLN
INTRAMUSCULAR | Status: AC
  Filled 2020-07-02: qty 1

## 2020-07-03 LAB — PTH, INTACT AND CALCIUM
Calcium, Total (PTH): 10.3 mg/dL — ABNORMAL HIGH (ref 8.7–10.2)
PTH: 300 pg/mL — ABNORMAL HIGH (ref 15–65)

## 2020-07-30 ENCOUNTER — Encounter (HOSPITAL_COMMUNITY): Payer: Commercial Managed Care - PPO

## 2020-08-09 ENCOUNTER — Other Ambulatory Visit: Payer: Self-pay

## 2020-08-09 ENCOUNTER — Ambulatory Visit (HOSPITAL_COMMUNITY)
Admission: RE | Admit: 2020-08-09 | Discharge: 2020-08-09 | Disposition: A | Discharge status: Home / Self Care | Payer: Commercial Managed Care - PPO | Source: Ambulatory Visit | Attending: Nephrology | Admitting: Nephrology

## 2020-08-09 VITALS — BP 152/76 | HR 80 | Temp 97.7°F | Resp 18

## 2020-08-09 DIAGNOSIS — N179 Acute kidney failure, unspecified: Secondary | ICD-10-CM

## 2020-08-09 DIAGNOSIS — N184 Chronic kidney disease, stage 4 (severe): Secondary | ICD-10-CM | POA: Diagnosis present

## 2020-08-09 LAB — RENAL FUNCTION PANEL
Albumin: 3.8 g/dL (ref 3.5–5.0)
Anion gap: 12 (ref 5–15)
BUN: 110 mg/dL — ABNORMAL HIGH (ref 6–20)
CO2: 22 mmol/L (ref 22–32)
Calcium: 10.2 mg/dL (ref 8.9–10.3)
Chloride: 100 mmol/L (ref 98–111)
Creatinine, Ser: 4.09 mg/dL — ABNORMAL HIGH (ref 0.44–1.00)
GFR, Estimated: 12 mL/min — ABNORMAL LOW (ref >60)
Glucose, Bld: 204 mg/dL — ABNORMAL HIGH (ref 70–99)
Phosphorus: 6.4 mg/dL — ABNORMAL HIGH (ref 2.5–4.6)
Potassium: 3.8 mmol/L (ref 3.5–5.1)
Sodium: 134 mmol/L — ABNORMAL LOW (ref 135–145)

## 2020-08-09 LAB — IRON AND TIBC
Iron: 27 ug/dL — ABNORMAL LOW (ref 28–170)
Saturation Ratios: 12 % (ref 10.4–31.8)
TIBC: 230 ug/dL — ABNORMAL LOW (ref 250–450)
UIBC: 203 ug/dL

## 2020-08-09 LAB — POCT HEMOGLOBIN-HEMACUE: Hemoglobin: 11 g/dL — ABNORMAL LOW (ref 12.0–15.0)

## 2020-08-09 LAB — FERRITIN: Ferritin: 648 ng/mL — ABNORMAL HIGH (ref 11–307)

## 2020-08-09 MED ORDER — EPOETIN ALFA 40000 UNIT/ML IJ SOLN: 40000.0000 [IU] | INTRAMUSCULAR | Status: DC

## 2020-08-09 MED ORDER — EPOETIN ALFA 40000 UNIT/ML IJ SOLN
INTRAMUSCULAR | Status: AC
  Administered 2020-08-09: 40000 [IU] via SUBCUTANEOUS
  Filled 2020-08-09: qty 1

## 2020-08-10 LAB — PTH, INTACT AND CALCIUM
Calcium, Total (PTH): 10.2 mg/dL (ref 8.7–10.2)
PTH: 340 pg/mL — ABNORMAL HIGH (ref 15–65)

## 2020-09-05 ENCOUNTER — Ambulatory Visit (HOSPITAL_COMMUNITY)
Admission: RE | Admit: 2020-09-05 | Discharge: 2020-09-05 | Disposition: A | Discharge status: Home / Self Care | Payer: Commercial Managed Care - PPO | Source: Ambulatory Visit | Attending: Nephrology | Admitting: Nephrology

## 2020-09-05 VITALS — BP 157/84 | HR 81 | Temp 97.5°F | Resp 18

## 2020-09-05 DIAGNOSIS — N184 Chronic kidney disease, stage 4 (severe): Secondary | ICD-10-CM

## 2020-09-05 DIAGNOSIS — N179 Acute kidney failure, unspecified: Secondary | ICD-10-CM | POA: Insufficient documentation

## 2020-09-05 LAB — RENAL FUNCTION PANEL
Albumin: 3.6 g/dL (ref 3.5–5.0)
Anion gap: 11 (ref 5–15)
BUN: 102 mg/dL — ABNORMAL HIGH (ref 6–20)
CO2: 22 mmol/L (ref 22–32)
Calcium: 10 mg/dL (ref 8.9–10.3)
Chloride: 99 mmol/L (ref 98–111)
Creatinine, Ser: 3.72 mg/dL — ABNORMAL HIGH (ref 0.44–1.00)
GFR, Estimated: 14 mL/min — ABNORMAL LOW (ref >60)
Glucose, Bld: 347 mg/dL — ABNORMAL HIGH (ref 70–99)
Phosphorus: 6.4 mg/dL — ABNORMAL HIGH (ref 2.5–4.6)
Potassium: 5.3 mmol/L — ABNORMAL HIGH (ref 3.5–5.1)
Sodium: 132 mmol/L — ABNORMAL LOW (ref 135–145)

## 2020-09-05 LAB — IRON AND TIBC
Iron: 76 ug/dL (ref 28–170)
Saturation Ratios: 30 % (ref 10.4–31.8)
TIBC: 252 ug/dL (ref 250–450)
UIBC: 176 ug/dL

## 2020-09-05 LAB — FERRITIN: Ferritin: 646 ng/mL — ABNORMAL HIGH (ref 11–307)

## 2020-09-05 MED ORDER — EPOETIN ALFA 40000 UNIT/ML IJ SOLN
INTRAMUSCULAR | Status: AC
  Filled 2020-09-05: qty 1

## 2020-09-05 MED ORDER — EPOETIN ALFA 40000 UNIT/ML IJ SOLN
40000.0000 [IU] | INTRAMUSCULAR | Status: DC
  Administered 2020-09-05: 40000 [IU] via SUBCUTANEOUS

## 2020-09-06 LAB — PTH, INTACT AND CALCIUM
Calcium, Total (PTH): 10 mg/dL (ref 8.7–10.2)
PTH: 310 pg/mL — ABNORMAL HIGH (ref 15–65)

## 2020-09-06 LAB — POCT HEMOGLOBIN-HEMACUE: Hemoglobin: 10.4 g/dL — ABNORMAL LOW (ref 12.0–15.0)

## 2020-09-20 ENCOUNTER — Emergency Department (HOSPITAL_COMMUNITY)
Admission: EM | Admit: 2020-09-20 | Discharge: 2020-09-20 | Disposition: A | Discharge status: Home / Self Care | Payer: Commercial Managed Care - PPO | Attending: Emergency Medicine | Admitting: Emergency Medicine

## 2020-09-20 DIAGNOSIS — G4700 Insomnia, unspecified: Secondary | ICD-10-CM | POA: Diagnosis present

## 2020-09-20 DIAGNOSIS — I5032 Chronic diastolic (congestive) heart failure: Secondary | ICD-10-CM | POA: Diagnosis not present

## 2020-09-20 DIAGNOSIS — G2581 Restless legs syndrome: Secondary | ICD-10-CM | POA: Insufficient documentation

## 2020-09-20 DIAGNOSIS — N185 Chronic kidney disease, stage 5: Secondary | ICD-10-CM | POA: Diagnosis not present

## 2020-09-20 DIAGNOSIS — Z79899 Other long term (current) drug therapy: Secondary | ICD-10-CM | POA: Insufficient documentation

## 2020-09-20 DIAGNOSIS — G479 Sleep disorder, unspecified: Secondary | ICD-10-CM

## 2020-09-20 DIAGNOSIS — Z794 Long term (current) use of insulin: Secondary | ICD-10-CM | POA: Diagnosis not present

## 2020-09-20 DIAGNOSIS — E1122 Type 2 diabetes mellitus with diabetic chronic kidney disease: Secondary | ICD-10-CM | POA: Insufficient documentation

## 2020-09-20 DIAGNOSIS — I132 Hypertensive heart and chronic kidney disease with heart failure and with stage 5 chronic kidney disease, or end stage renal disease: Secondary | ICD-10-CM | POA: Insufficient documentation

## 2020-09-20 DIAGNOSIS — E1140 Type 2 diabetes mellitus with diabetic neuropathy, unspecified: Secondary | ICD-10-CM | POA: Diagnosis not present

## 2020-09-20 MED ORDER — ROPINIROLE HCL 1 MG PO TABS: 1.0000 mg | ORAL_TABLET | Freq: Every day | ORAL | 0 refills | Status: DC | PRN

## 2020-09-20 NOTE — ED Provider Notes (Signed)
[facility] EMERGENCY DEPARTMENT Provider Note   CSN: [id number] Arrival date & time: 09/20/20  0121     History Chief Complaint  Patient presents with   restless leg syndrome   Insomnia    Holly [name] is a 55 y.o. female.  55 year old female who presents to the emergency department today secondary to restless legs and insomnia.  Patient states that she was on ropinirole and has not been able get a refill.  She states that she has had it for the last few nights in the last couple nights does not sleep because she has discomfort in her legs.  Exactly same has been the last 3 months is a multiple work-ups for the same.  She has chronic kidney disease most recently labs were checked about a week ago.  She is also had her iron labs checked multiple times without any significant abnormalities.  She tried Ambien and a couple other home medications to help with trying to sleep which have not worked.  No change in caffeine intake.  No alcohol drugs or tobacco.   Insomnia      Past Medical History:  Diagnosis Date   Acute cystitis    Anxiety state, unspecified    Background diabetic retinopathy(362.01)    Bulimia    Calculus of kidney    Cellulitis and abscess of foot 01/24/2015   left foot   Chronic inflammatory demyelinating polyneuritis (HCC)    Chronic kidney disease, stage IV (severe) (HCC)    Complication of anesthesia    slow to wake up, had lost a lot of blood - 2010   Diarrhea    symptomatic   Disorders of magnesium metabolism    DM2 (diabetes mellitus, type 2) (HCC)    Dysthymic disorder    Dysuria    Edema    moderate   Encounter for long-term (current) use of other medications    Esophageal reflux    Essential hypertension, benign    Fever 10/2018   Heart murmur    been told very slight, never given her any problems   History of blood transfusion X 6-7; 2010 - present (03/29/2014)   "related to OR; kidney issues"   HLD (hyperlipidemia)     HDL goal >50, LDL goal <100   Iron deficiency anemia    "suppose to get procrit injections q 3 wks; usually don't" do it" (03/29/2014)   Irritable bowel syndrome    Left heart failure (HCC)    Muscle weakness (generalized)    Neuralgia, neuritis, and radiculitis, unspecified    Palpitations    Peripheral autonomic neuropathy in disorders classified elsewhere(337.1)    Pneumonia 2010   Primary pulmonary hypertension ([place])    not seen at CATH  (71m Hg), pt not aware of this   Type II or unspecified type diabetes mellitus with other specified manifestations, not stated as uncontrolled    [facility] Normal coronary arteries   Unspecified essential hypertension    [facility] No significant renal artery stenosis bilaterally    Patient Active Problem List   Diagnosis Date Noted   Deficiency anemia 11/15/2018   Leukopenia 11/08/2018   [place] of [place]atarsal am[place] of left foot ([place]    Fever 11/06/2018   UTI (urinary tract infection) 01/30/2015   Acute renal failure superimposed on stage 4 chronic kidney disease ([place] 01/28/2015   Foot abscess, left 01/27/2015   Uncontrolled  type 2 diabetes mellitus with foot ulcer ([place])    Cellulitis of left foot 01/24/2015   Thrombocytopenia ([place]) 01/24/2015   Dehydration, moderate 01/24/2015   CKD (chronic kidney disease), stage V ([place]) 01/24/2015   Type 2 diabetes mellitus with left diabetic foot ulcer ([place]) 12/24/2014   Type 2 diabetes mellitus with right diabetic foot ulcer ([place]) 12/24/2014   Type II diabetes mellitus with neurological manifestations ([place]) 12/24/2014   Acute renal failure superimposed on stage 3 chronic kidney disease ([place]) 03/29/2014   Neurogenic bladder/suspected 03/29/2014   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   HTN (hypertension) 03/29/2014   Menorrhagia 07/19/2011   Tricuspid regurgitation 10/30/2010   Anemia in  chronic kidney disease 10/30/2010   Dysautonomia ([place]) 08/20/2010   Chronic diastolic heart failure ([place]) 07/10/2010   Diabetes mellitus type 2, uncontrolled ([place]) 04/04/2007   DEPRESSION 09/08/2006   Peripheral neuropathy ([place]) 09/08/2006   CHOLELITHIASIS 09/08/2006    Past Surgical History:  Procedure Laterality Date   AMPUTATION Left 01/26/2015   Procedure: POST TRANS MET ;  Surgeon: Newt Minion, MD;  Location: [place];  Service: Orthopedics;  Laterality: Left;   APPENDECTOMY     AV FISTULA PLACEMENT Right 01/08/2015   Procedure: ARTERIOVENOUS (AV) FISTULA CREATION;  Surgeon: Angelia Mould, MD;  Location: [place];  Service: Vascular;  Laterality: Right;   CESAREAN SECTION  2001; 1999; [place] AND DRAINAGE OF WOUND Right 2010   "serious staph infection"   PARS PLANA VITRECTOMY Bilateral 2010   "related to DM"   TONSILLECTOMY AND ADENOIDECTOMY       OB History   No obstetric history on file.     Family History  Problem Relation Age of Onset   Hypertension Mother    Myelodysplastic syndrome Father    Pancreatic cancer Other     Social History   Tobacco Use   Smoking status: Never   Smokeless tobacco: Never  Vaping Use   Vaping Use: Never used  Substance Use Topics   Alcohol use: Yes    Comment: 03/29/2014 "might have a drink a couple times/yr"   Drug use: No    Home Medications Prior to Admission medications   Medication Sig Start Date End Date Taking? Authorizing Provider  benazepril (LOTENSIN) 40 MG tablet Take 40 mg by mouth at bedtime. 08/22/18   [provider]  calcitRIOL (ROCALTROL) 0.25 MCG capsule Take 0.25 mcg by mouth daily.     [provider]  calcium acetate (PHOSLO) 667 MG capsule Take 667 mg by mouth 2 (two) times daily.    [provider]  felodipine (PLENDIL) 5 MG 24 hr tablet Take 5 mg by mouth at bedtime. 12/16/14   [provider]   FLUoxetine (PROZAC) 40 MG capsule Take 40 mg by mouth every morning.     [provider]  furosemide (LASIX) 20 MG tablet Take 20 mg by mouth 2 (two) times daily. 01/01/13   [provider]  glucose blood (ONETOUCH VERIO) test strip 1 each by Other route 2 (two) times daily. And lancets 2/day 250/.61 05/25/12   Renato Shin, MD  Insulin Glargine, 1 Unit Dial, 300 UNIT/ML SOPN Inject 14-16 Units into the skin daily. 06/09/16   [provider]  linagliptin (TRADJENTA) 5 MG TABS tablet Take 5 mg by mouth daily.    [provider]  rOPINIRole (REQUIP) 1 MG tablet Take 1 tablet (  1 mg total) by mouth daily as needed. 09/20/20   Graci Hulce, Corene Cornea, MD  Semaglutide (OZEMPIC, 0.25 OR 0.5 MG/DOSE, [place]) Inject 2.5 mg into the skin once a week.    [provider]  sodium bicarbonate 650 MG tablet Take 1,300 mg by mouth 2 (two) times daily.    [provider]  zolpidem (AMBIEN) 5 MG tablet Take 5 mg by mouth at bedtime. 10/18/18   [provider]    Allergies    Bactrim, Byetta 10 mcg pen [exenatide], Hctz [hydrochlorothiazide], Spironolactone, Sulfa antibiotics, Sulfacetamide sodium, and Sulfamethoxazole-trimethoprim  Review of Systems   Review of Systems  Psychiatric/Behavioral:  The patient has insomnia.   All other systems reviewed and are negative.  Physical Exam Updated Vital Signs BP 120/73 (BP Location: Left Arm)   Pulse 79   Temp 97.6 F (36.4 C) (Oral)   Resp 20   LMP 07/17/2011   SpO2 98%   Physical Exam Vitals and nursing note reviewed.  Constitutional:      Appearance: She is well-developed.  HENT:     Head: Normocephalic and atraumatic.  Eyes:     Pupils: Pupils are equal, round, and reactive to light.  Cardiovascular:     Rate and Rhythm: Normal rate and regular rhythm.  Pulmonary:     Effort: No respiratory distress.     Breath sounds: No stridor.  Abdominal:     General: There is no distension.  Musculoskeletal:         General: No swelling or tenderness. Normal range of motion.     Cervical back: Normal range of motion.  Skin:    General: Skin is warm and dry.     Coloration: Skin is not jaundiced or pale.  Neurological:     General: No focal deficit present.     Mental Status: She is alert.     Comments: Ambulates without difficulty.  Symmetric and normal strength BLE    ED Results / Procedures / Treatments   Labs (all labs ordered are listed, but only abnormal results are displayed) Labs Reviewed - No data to display  EKG None  Radiology No results found.  Procedures Procedures   Medications Ordered in ED Medications - No data to display  ED Course  I have reviewed the triage vital signs and the nursing notes.  Pertinent labs & imaging results that were available during my care of the patient were reviewed by me and considered in my medical decision making (see chart for details).    MDM Rules/Calculators/A&P                          After discussion with pharmacy will refill ropinirole as the other medications are not really preferable in the setting of chronic kidney disease.  Final Clinical Impression(s) / ED Diagnoses Final diagnoses:  Difficulty sleeping    Rx / DC Orders ED Discharge Orders          Ordered    rOPINIRole (REQUIP) 1 MG tablet  Daily PRN        09/20/20 0300             Aleza Pew, Corene Cornea, MD 09/20/20 2314

## 2020-09-20 NOTE — ED Triage Notes (Signed)
Pt w h/o RLS, states she's not slept in 2 days. Taken "everything" including ambien, melatonin gummies, riperdal, OTC sleep aid. Has seen multiple specialists, no relief.

## 2020-10-03 ENCOUNTER — Encounter (HOSPITAL_COMMUNITY)
Admission: RE | Admit: 2020-10-03 | Discharge: 2020-10-03 | Disposition: A | Discharge status: Home / Self Care | Payer: Commercial Managed Care - PPO | Source: Ambulatory Visit | Attending: Nephrology | Admitting: Nephrology

## 2020-10-03 VITALS — BP 150/82 | HR 77 | Temp 97.8°F | Resp 17

## 2020-10-03 DIAGNOSIS — N184 Chronic kidney disease, stage 4 (severe): Secondary | ICD-10-CM | POA: Diagnosis present

## 2020-10-03 DIAGNOSIS — N179 Acute kidney failure, unspecified: Secondary | ICD-10-CM | POA: Diagnosis not present

## 2020-10-03 LAB — RENAL FUNCTION PANEL
Albumin: 3.8 g/dL (ref 3.5–5.0)
Anion gap: 13 (ref 5–15)
BUN: 109 mg/dL — ABNORMAL HIGH (ref 6–20)
CO2: 19 mmol/L — ABNORMAL LOW (ref 22–32)
Calcium: 10.3 mg/dL (ref 8.9–10.3)
Chloride: 103 mmol/L (ref 98–111)
Creatinine, Ser: 4.37 mg/dL — ABNORMAL HIGH (ref 0.44–1.00)
GFR, Estimated: 11 mL/min — ABNORMAL LOW (ref >60)
Glucose, Bld: 125 mg/dL — ABNORMAL HIGH (ref 70–99)
Phosphorus: 6.9 mg/dL — ABNORMAL HIGH (ref 2.5–4.6)
Potassium: 5.1 mmol/L (ref 3.5–5.1)
Sodium: 135 mmol/L (ref 135–145)

## 2020-10-03 LAB — IRON AND TIBC
Iron: 97 ug/dL (ref 28–170)
Saturation Ratios: 37 % — ABNORMAL HIGH (ref 10.4–31.8)
TIBC: 263 ug/dL (ref 250–450)
UIBC: 166 ug/dL

## 2020-10-03 LAB — FERRITIN: Ferritin: 490 ng/mL — ABNORMAL HIGH (ref 11–307)

## 2020-10-03 LAB — POCT HEMOGLOBIN-HEMACUE: Hemoglobin: 11.7 g/dL — ABNORMAL LOW (ref 12.0–15.0)

## 2020-10-03 MED ORDER — EPOETIN ALFA 40000 UNIT/ML IJ SOLN
INTRAMUSCULAR | Status: AC
  Filled 2020-10-03: qty 1

## 2020-10-03 MED ORDER — EPOETIN ALFA 40000 UNIT/ML IJ SOLN
40000.0000 [IU] | INTRAMUSCULAR | Status: DC
  Administered 2020-10-03: 40000 [IU] via SUBCUTANEOUS

## 2020-10-04 LAB — PTH, INTACT AND CALCIUM
Calcium, Total (PTH): 10.8 mg/dL — ABNORMAL HIGH (ref 8.7–10.2)
PTH: 384 pg/mL — ABNORMAL HIGH (ref 15–65)

## 2020-10-31 ENCOUNTER — Encounter (HOSPITAL_COMMUNITY): Payer: Self-pay

## 2020-10-31 ENCOUNTER — Encounter (HOSPITAL_COMMUNITY): Payer: Commercial Managed Care - PPO

## 2020-11-21 ENCOUNTER — Other Ambulatory Visit: Payer: Self-pay

## 2020-11-21 ENCOUNTER — Ambulatory Visit (HOSPITAL_COMMUNITY)
Admission: RE | Admit: 2020-11-21 | Discharge: 2020-11-21 | Disposition: A | Discharge status: Home / Self Care | Payer: Commercial Managed Care - PPO | Source: Ambulatory Visit | Attending: Nephrology | Admitting: Nephrology

## 2020-11-21 VITALS — BP 163/75 | HR 77 | Temp 97.2°F | Resp 18

## 2020-11-21 DIAGNOSIS — N179 Acute kidney failure, unspecified: Secondary | ICD-10-CM | POA: Diagnosis not present

## 2020-11-21 DIAGNOSIS — N184 Chronic kidney disease, stage 4 (severe): Secondary | ICD-10-CM | POA: Diagnosis present

## 2020-11-21 LAB — FERRITIN: Ferritin: 481 ng/mL — ABNORMAL HIGH (ref 11–307)

## 2020-11-21 LAB — POCT HEMOGLOBIN-HEMACUE: Hemoglobin: 10.5 g/dL — ABNORMAL LOW (ref 12.0–15.0)

## 2020-11-21 LAB — RENAL FUNCTION PANEL
Albumin: 3.8 g/dL (ref 3.5–5.0)
Anion gap: 10 (ref 5–15)
BUN: 79 mg/dL — ABNORMAL HIGH (ref 6–20)
CO2: 24 mmol/L (ref 22–32)
Calcium: 10.6 mg/dL — ABNORMAL HIGH (ref 8.9–10.3)
Chloride: 102 mmol/L (ref 98–111)
Creatinine, Ser: 3.92 mg/dL — ABNORMAL HIGH (ref 0.44–1.00)
GFR, Estimated: 13 mL/min — ABNORMAL LOW (ref >60)
Glucose, Bld: 189 mg/dL — ABNORMAL HIGH (ref 70–99)
Phosphorus: 5.2 mg/dL — ABNORMAL HIGH (ref 2.5–4.6)
Potassium: 4.2 mmol/L (ref 3.5–5.1)
Sodium: 136 mmol/L (ref 135–145)

## 2020-11-21 LAB — IRON AND TIBC
Iron: 81 ug/dL (ref 28–170)
Saturation Ratios: 29 % (ref 10.4–31.8)
TIBC: 283 ug/dL (ref 250–450)
UIBC: 202 ug/dL

## 2020-11-21 MED ORDER — EPOETIN ALFA 40000 UNIT/ML IJ SOLN
40000.0000 [IU] | INTRAMUSCULAR | Status: DC
  Administered 2020-11-21: 40000 [IU] via SUBCUTANEOUS

## 2020-11-21 MED ORDER — EPOETIN ALFA 40000 UNIT/ML IJ SOLN
INTRAMUSCULAR | Status: AC
  Filled 2020-11-21: qty 1

## 2020-11-22 LAB — PTH, INTACT AND CALCIUM
Calcium, Total (PTH): 10.4 mg/dL — ABNORMAL HIGH (ref 8.7–10.2)
PTH: 290 pg/mL — ABNORMAL HIGH (ref 15–65)

## 2020-12-04 ENCOUNTER — Emergency Department (HOSPITAL_COMMUNITY): Payer: Commercial Managed Care - PPO

## 2020-12-04 ENCOUNTER — Encounter (HOSPITAL_COMMUNITY): Payer: Self-pay | Admitting: Emergency Medicine

## 2020-12-04 ENCOUNTER — Emergency Department (HOSPITAL_COMMUNITY)
Admission: EM | Admit: 2020-12-04 | Discharge: 2020-12-04 | Disposition: A | Discharge status: Home / Self Care | Payer: Commercial Managed Care - PPO | Attending: Student | Admitting: Student

## 2020-12-04 DIAGNOSIS — I130 Hypertensive heart and chronic kidney disease with heart failure and stage 1 through stage 4 chronic kidney disease, or unspecified chronic kidney disease: Secondary | ICD-10-CM | POA: Insufficient documentation

## 2020-12-04 DIAGNOSIS — Z79899 Other long term (current) drug therapy: Secondary | ICD-10-CM | POA: Diagnosis not present

## 2020-12-04 DIAGNOSIS — N184 Chronic kidney disease, stage 4 (severe): Secondary | ICD-10-CM | POA: Diagnosis not present

## 2020-12-04 DIAGNOSIS — I509 Heart failure, unspecified: Secondary | ICD-10-CM | POA: Diagnosis not present

## 2020-12-04 DIAGNOSIS — E119 Type 2 diabetes mellitus without complications: Secondary | ICD-10-CM | POA: Diagnosis not present

## 2020-12-04 DIAGNOSIS — R079 Chest pain, unspecified: Secondary | ICD-10-CM | POA: Diagnosis present

## 2020-12-04 DIAGNOSIS — R0789 Other chest pain: Secondary | ICD-10-CM | POA: Diagnosis not present

## 2020-12-04 LAB — CBC
HCT: 36.5 % (ref 36.0–46.0)
Hemoglobin: 11 g/dL — ABNORMAL LOW (ref 12.0–15.0)
MCH: 28.5 pg (ref 26.0–34.0)
MCHC: 30.1 g/dL (ref 30.0–36.0)
MCV: 94.6 fL (ref 80.0–100.0)
Platelets: 146 10*3/uL — ABNORMAL LOW (ref 150–400)
RBC: 3.86 MIL/uL — ABNORMAL LOW (ref 3.87–5.11)
RDW: 14.1 % (ref 11.5–15.5)
WBC: 5.6 10*3/uL (ref 4.0–10.5)
nRBC: 0 % (ref 0.0–0.2)

## 2020-12-04 LAB — BASIC METABOLIC PANEL
Anion gap: 11 (ref 5–15)
BUN: 93 mg/dL — ABNORMAL HIGH (ref 6–20)
CO2: 23 mmol/L (ref 22–32)
Calcium: 10.3 mg/dL (ref 8.9–10.3)
Chloride: 101 mmol/L (ref 98–111)
Creatinine, Ser: 4.33 mg/dL — ABNORMAL HIGH (ref 0.44–1.00)
GFR, Estimated: 11 mL/min — ABNORMAL LOW (ref >60)
Glucose, Bld: 136 mg/dL — ABNORMAL HIGH (ref 70–99)
Potassium: 4.6 mmol/L (ref 3.5–5.1)
Sodium: 135 mmol/L (ref 135–145)

## 2020-12-04 LAB — TROPONIN I (HIGH SENSITIVITY)
Troponin I (High Sensitivity): 19 ng/L — ABNORMAL HIGH (ref <18)
Troponin I (High Sensitivity): 18 ng/L — ABNORMAL HIGH (ref <18)

## 2020-12-04 NOTE — ED Triage Notes (Signed)
Patient complains of intermittent chest discomfort that started several months ago, states she was referred to [facility] for further evaluation. Patient states discomfort occurs randomly and there does not seem to be any specific event that brings on discomfort. Discomfort states in left shoulder and radiates into chest. Patient alert, oriented, and in no apparent distress at this time.

## 2020-12-04 NOTE — Discharge Instructions (Signed)

## 2020-12-04 NOTE — ED Notes (Signed)
Patient transported to X-ray 

## 2020-12-04 NOTE — ED Provider Notes (Signed)
[facility] EMERGENCY DEPARTMENT Provider Note   CSN: [id number] Arrival date & time: 12/04/20  1029     History Chief Complaint  Patient presents with   Chest Pain    Alantra Popoca Monnig is a 55 y.o. female who presents emergency department with chief complaint of chest discomfort.  Patient states that for months she has been having a sensation in the left neck, left upper arm and left side of the chest which she describes as "discomfort."  She also states that it is like "having less restless legs in my chest."  She states that nothing seems to make the discomfort worse or better.  It is not exacerbated by movement or exertion.  Patient states that she was at her doctor's office for routine lab checks today, told her doctor about the sensation and was asked to come to the emergency department for further evaluation.  She states "I do not feel like I am having a heart attack."  She denies nausea, vomiting, diaphoresis.  She has an extensive past medical history that includes diabetes, chronic kidney disease, chronic inflammatory demyelinating polyneuritis, hypertension and hyperlipidemia.  The patient states that she does not take medications for her high cholesterol but is compliant with the remainder of her medications.  Denies a history of smoking, or family history of MI.   Chest Pain     Past Medical History:  Diagnosis Date   Acute cystitis    Anxiety state, unspecified    Background diabetic retinopathy(362.01)    Bulimia    Calculus of kidney    Cellulitis and abscess of foot 01/24/2015   left foot   Chronic inflammatory demyelinating polyneuritis (HCC)    Chronic kidney disease, stage IV (severe) (HCC)    Complication of anesthesia    slow to wake up, had lost a lot of blood - 2010   Diarrhea    symptomatic   Disorders of magnesium metabolism    DM2 (diabetes mellitus, type 2) (HCC)    Dysthymic disorder    Dysuria    Edema    moderate   Encounter for  long-term (current) use of other medications    Esophageal reflux    Essential hypertension, benign    Fever 10/2018   Heart murmur    been told very slight, never given her any problems   History of blood transfusion X 6-7; 2010 - present (03/29/2014)   "related to OR; kidney issues"   HLD (hyperlipidemia)    HDL goal >50, LDL goal <100   Iron deficiency anemia    "suppose to get procrit injections q 3 wks; usually don't" do it" (03/29/2014)   Irritable bowel syndrome    Left heart failure (HCC)    Muscle weakness (generalized)    Neuralgia, neuritis, and radiculitis, unspecified    Palpitations    Peripheral autonomic neuropathy in disorders classified elsewhere(337.1)    Pneumonia 2010   Primary pulmonary hypertension ([place])    not seen at CATH  (105m Hg), pt not aware of this   Type II or unspecified type diabetes mellitus with other specified manifestations, not stated as uncontrolled    [facility] Normal coronary arteries   Unspecified essential hypertension    [facility] No significant renal artery stenosis bilaterally    Patient Active Problem List   Diagnosis Date Noted   Deficiency anemia 11/15/2018   Leukopenia 11/08/2018  History of transmetatarsal amputation of left foot ([place])    Fever 11/06/2018   UTI (urinary tract infection) 01/30/2015   Acute renal failure superimposed on stage 4 chronic kidney disease ([place]) 01/28/2015   Foot abscess, left 01/27/2015   Uncontrolled type 2 diabetes mellitus with foot ulcer    Cellulitis of left foot 01/24/2015   Thrombocytopenia ([place]) 01/24/2015   Dehydration, moderate 01/24/2015   CKD (chronic kidney disease), stage V ([place]) 01/24/2015   Type 2 diabetes mellitus with left diabetic foot ulcer ([place]) 12/24/2014   Type 2 diabetes mellitus with right diabetic foot ulcer ([place]) 12/24/2014   Type II diabetes mellitus with  neurological manifestations ([place]) 12/24/2014   Acute renal failure superimposed on stage 3 chronic kidney disease ([place]) 03/29/2014   Neurogenic bladder/suspected 03/29/2014   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   HTN (hypertension) 03/29/2014   Menorrhagia 07/19/2011   Tricuspid regurgitation 10/30/2010   Anemia in chronic kidney disease 10/30/2010   Dysautonomia ([place]) 08/20/2010   Chronic diastolic heart failure ([place]) 07/10/2010   Diabetes mellitus type 2, uncontrolled 04/04/2007   DEPRESSION 09/08/2006   Peripheral neuropathy ([place]) 09/08/2006   CHOLELITHIASIS 09/08/2006    Past Surgical History:  Procedure Laterality Date   AMPUTATION Left 01/26/2015   Procedure: POST TRANS MET ;  Surgeon: Newt Minion, MD;  Location: [facility];  Service: Orthopedics;  Laterality: Left;   APPENDECTOMY     AV FISTULA PLACEMENT Right 01/08/2015   Procedure: ARTERIOVENOUS (AV) FISTULA CREATION;  Surgeon: Angelia Mould, MD;  Location: [facility];  Service: Vascular;  Laterality: Right;   CESAREAN SECTION  2001; 1999; [place] AND DRAINAGE OF WOUND Right 2010   "serious staph infection"   PARS PLANA VITRECTOMY Bilateral 2010   "related to DM"   TONSILLECTOMY AND ADENOIDECTOMY       OB History   No obstetric history on file.     Family History  Problem Relation Age of Onset   Hypertension Mother    Myelodysplastic syndrome Father    Pancreatic cancer Other     Social History   Tobacco Use   Smoking status: Never   Smokeless tobacco: Never  Vaping Use   Vaping Use: Never used  Substance Use Topics   Alcohol use: Yes    Comment: 03/29/2014 "might have a drink a couple times/yr"   Drug use: No    Home Medications Prior to Admission medications   Medication Sig Start Date End Date Taking? Authorizing Provider  benazepril (LOTENSIN) 40 MG tablet Take 40 mg by mouth at bedtime. 08/22/18   [provider]  calcitRIOL  (ROCALTROL) 0.25 MCG capsule Take 0.25 mcg by mouth daily.     [provider]  calcium acetate (PHOSLO) 667 MG capsule Take 667 mg by mouth 2 (two) times daily.    [provider]  felodipine (PLENDIL) 5 MG 24 hr tablet Take 5 mg by mouth at bedtime. 12/16/14   [provider]  FLUoxetine (PROZAC) 40 MG capsule Take 40 mg by mouth every morning.     [provider]  furosemide (LASIX) 20 MG tablet Take 20 mg by mouth 2 (two) times daily. 01/01/13   [provider]  glucose blood (ONETOUCH VERIO) test strip 1 each by Other route 2 (two) times daily. And lancets 2/day 250/.61 05/25/12   Renato Shin, MD  Insulin Glargine, 1 Unit Dial, 300 UNIT/ML SOPN  Inject 14-16 Units into the skin daily. 06/09/16   [provider]  linagliptin (TRADJENTA) 5 MG TABS tablet Take 5 mg by mouth daily.    [provider]  rOPINIRole (REQUIP) 1 MG tablet Take 1 tablet (1 mg total) by mouth daily as needed. 09/20/20   Mesner, Corene Cornea, MD  Semaglutide (OZEMPIC, 0.25 OR 0.5 MG/DOSE, [place]) Inject 2.5 mg into the skin once a week.    [provider]  sodium bicarbonate 650 MG tablet Take 1,300 mg by mouth 2 (two) times daily.    [provider]  zolpidem (AMBIEN) 5 MG tablet Take 5 mg by mouth at bedtime. 10/18/18   [provider]    Allergies    Bactrim, Byetta 10 mcg pen [exenatide], Hctz [hydrochlorothiazide], Spironolactone, Sulfa antibiotics, Sulfacetamide sodium, and Sulfamethoxazole-trimethoprim  Review of Systems   Review of Systems  Cardiovascular:  Positive for chest pain.  Ten systems reviewed and are negative for acute change, except as noted in the HPI.   Physical Exam Updated Vital Signs BP 126/85   Pulse 80   Temp 97.7 F (36.5 C) (Oral)   Resp 16   LMP 07/17/2011   SpO2 98%   Physical Exam Vitals and nursing note reviewed.  Constitutional:      General: She is not in acute distress.    Appearance: She is  well-developed. She is not diaphoretic.  HENT:     Head: Normocephalic and atraumatic.     Right Ear: External ear normal.     Left Ear: External ear normal.     Nose: Nose normal.     Mouth/Throat:     Mouth: Mucous membranes are moist.  Eyes:     General: No scleral icterus.    Conjunctiva/sclera: Conjunctivae normal.  Cardiovascular:     Rate and Rhythm: Normal rate and regular rhythm.     Heart sounds: Normal heart sounds. No murmur heard.   No friction rub. No gallop.  Pulmonary:     Effort: Pulmonary effort is normal. No respiratory distress.     Breath sounds: Normal breath sounds.  Chest:     Chest wall: No tenderness.  Abdominal:     General: Bowel sounds are normal. There is no distension.     Palpations: Abdomen is soft. There is no mass.     Tenderness: There is no abdominal tenderness. There is no guarding.  Musculoskeletal:     Cervical back: Normal range of motion.  Skin:    General: Skin is warm and dry.  Neurological:     Mental Status: She is alert and oriented to person, place, and time.  Psychiatric:        Behavior: Behavior normal.    ED Results / Procedures / Treatments   Labs (all labs ordered are listed, but only abnormal results are displayed) Labs Reviewed  BASIC METABOLIC PANEL - Abnormal; Notable for the following components:      Result Value   Glucose, Bld 136 (*)    BUN 93 (*)    Creatinine, Ser 4.33 (*)    GFR, Estimated 11 (*)    All other components within normal limits  CBC - Abnormal; Notable for the following components:   RBC 3.86 (*)    Hemoglobin 11.0 (*)    Platelets 146 (*)    All other components within normal limits  TROPONIN I (HIGH SENSITIVITY) - Abnormal; Notable for the following components:   Troponin I (High Sensitivity) 19 (*)  All other components within normal limits    EKG EKG Interpretation  Date/Time:  Wednesday December 04 2020 10:41:27 EST Ventricular Rate:  78 PR Interval:  156 QRS  Duration: 90 QT Interval:  418 QTC Calculation: 476 R Axis:   16 Text Interpretation: Normal sinus rhythm Low voltage QRS No significant change was found Confirmed by Lennice Sites ([id number]) on 12/04/2020 11:34:08 AM  Radiology DG Chest 2 View  Result Date: 12/04/2020 CLINICAL DATA:  Chest pain EXAM: CHEST - 2 VIEW COMPARISON:  Chest x-ray 11/05/2018 FINDINGS: Heart size and mediastinal contours are within normal limits. No suspicious pulmonary opacities identified. No pleural effusion or pneumothorax visualized. No acute osseous abnormality appreciated. IMPRESSION: No acute intrathoracic process identified. Electronically Signed   By: Ofilia Neas M.D.   On: 12/04/2020 11:01    Procedures Procedures   Medications Ordered in ED Medications - No data to display  ED Course  I have reviewed the triage vital signs and the nursing notes.  Pertinent labs & imaging results that were available during my care of the patient were reviewed by me and considered in my medical decision making (see chart for details).    MDM Rules/Calculators/A&P                          Given the large differential diagnosis for Luree Palla Calabrese, the decision making in this case is of high complexity.  I ordered and reviewed labs that included CBC which shows mild anemia, BMP with chronically elevated BUN/creatinine consistent with patient's history of CKD stage IV, mildly elevated glucose consistent with history of diabetes.  Patient's troponin 19, then 18.  This is just above normal but given her renal insufficiency  appears to be within normal limits.  I reviewed a 2 view chest x-ray image which shows no acute abnormalities.  EKG shows sinus rhythm at a rate of 78.   After evaluating all of the data points in this case, the presentation of Shalay Carder Whitfill is NOT consistent with Acute Coronary Syndrome (ACS) and/or myocardial ischemia, pulmonary embolism, aortic dissection; Borhaave's, significant arrythmia,  pneumothorax, cardiac tamponade, or other emergent cardiopulmonary condition.  Further, the presentation of Cherese Lozano Rarick is NOT consistent with pericarditis, myocarditis, cholecystitis, pancreatitis, mediastinitis, endocarditis, new valvular disease.  Additionally, the presentation of Genevra Orne Delzottois NOT consistent with flail chest, cardiac contusion, ARDS, or significant intra-thoracic or intra-abdominal bleeding.  Moreover, this presentation is NOT consistent with pneumonia, sepsis, or pyelonephritis.   Strict return and follow-up precautions have been given by me personally or by detailed written instruction given verbally by nursing staff using the teach back method to the patient/family/caregiver(s).  Data Reviewed/Counseling: I have reviewed the patient's vital signs, nursing notes, and other relevant tests/information. I had a detailed discussion regarding the historical points, exam findings, and any diagnostic results supporting the discharge diagnosis. I also discussed the need for outpatient follow-up and the need to return to the ED if symptoms worsen or if there are any questions or concerns that arise at home.  Final Clinical Impression(s) / ED Diagnoses Final diagnoses:  None    Rx / DC Orders ED Discharge Orders     None        Margarita Mail, PA-C 12/04/20 1438    Lennice Sites, DO 12/04/20 1516

## 2020-12-10 ENCOUNTER — Other Ambulatory Visit: Payer: Self-pay | Admitting: *Deleted

## 2020-12-10 ENCOUNTER — Encounter: Payer: Self-pay | Admitting: *Deleted

## 2020-12-10 ENCOUNTER — Ambulatory Visit: Payer: Commercial Managed Care - PPO | Admitting: Psychiatry

## 2020-12-10 VITALS — BP 175/87 | HR 77 | Ht 66.0 in | Wt 133.0 lb

## 2020-12-10 DIAGNOSIS — G2581 Restless legs syndrome: Secondary | ICD-10-CM | POA: Diagnosis not present

## 2020-12-10 NOTE — Progress Notes (Signed)
[facility]  PATIENT: Holly [name] DOB: 1966-01-25  REFERRING CLINICIAN: Sue Lush, PA-C HISTORY FROM: self REASON FOR VISIT: RLS   HISTORICAL  CHIEF COMPLAINT:  Chief Complaint  Patient presents with   Follow-up    RM 1 alone Pt is well,she has RLS in her L leg up to her side. Does have a amputated foot. Has been experiencing these symptoms for about 4-5 months, mentioned she started drinking energy drinks and that's when symptoms started but she stopped and symptoms persist.  No other onsite triggers she is aware.     HISTORY OF PRESENT ILLNESS:  The patient is here for a restless sensation in her left leg which began 4 months ago. Symptoms began when she started drinking energy drinks. She has since stopped the energy drinks but RLS did not stop. It begins around 7-9 pm at night. States she can feel in her left arm occasionally as well. Symptoms improve if she moves her legs or shakes them. Caffeine makes it worse. Riding her exercise bike and walking help reduce her restless leg symptoms. Has mild symptoms on the right but it is predominantly on the left. No associated pain.  Husband does not notice her kicking or moving her legs in her sleep. She does feel tired during the day.  She was started on ropinirole in August which does help her symptoms. She has to take it every night or will not be able to sleep. Sometimes she will take an extra dose at bedtime if symptoms are severe enough.  OTHER MEDICAL CONDITIONS: DM, CKD (has a fistula but is not on dialysis), HTN   REVIEW OF SYSTEMS: Full 14 system review of systems performed and negative with exception of: restless legs  ALLERGIES: Allergies  Allergen Reactions   Bactrim Nausea And Vomiting   Byetta 10 Mcg Pen [Exenatide] Nausea And Vomiting   Hctz [Hydrochlorothiazide] Other (See Comments) and Itching    dizziness dizziness   Spironolactone Other (See Comments)    Dizziness    Sulfa  Antibiotics Nausea And Vomiting   Sulfacetamide Sodium Nausea And Vomiting   Sulfamethoxazole-Trimethoprim Nausea And Vomiting    HOME MEDICATIONS: Outpatient Medications Prior to Visit  Medication Sig Dispense Refill   benazepril (LOTENSIN) 20 MG tablet Take 10 mg by mouth at bedtime.     calcitRIOL (ROCALTROL) 0.25 MCG capsule Take 0.25 mcg by mouth daily.      calcium acetate (PHOSLO) 667 MG capsule Take 667 mg by mouth 2 (two) times daily.     felodipine (PLENDIL) 5 MG 24 hr tablet Take 5 mg by mouth at bedtime.  6   FLUoxetine (PROZAC) 40 MG capsule Take 40 mg by mouth every morning.      furosemide (LASIX) 40 MG tablet Take 40 mg by mouth daily.     glucose blood (ONETOUCH VERIO) test strip 1 each by Other route 2 (two) times daily. And lancets 2/day 250/.61 100 each 12   Insulin Glargine, 1 Unit Dial, 300 UNIT/ML SOPN Inject 14-16 Units into the skin daily.     labetalol (NORMODYNE) 200 MG tablet Take 200 mg by mouth 2 (two) times daily.     linagliptin (TRADJENTA) 5 MG TABS tablet Take 5 mg by mouth daily.     pravastatin (PRAVACHOL) 10 MG tablet Take by mouth daily.     rOPINIRole (REQUIP) 1 MG tablet Take 1 tablet (1 mg total) by mouth daily as needed. 21 tablet 0   Semaglutide (OZEMPIC,  0.25 OR 0.5 MG/DOSE, ) Inject 2.5 mg into the skin once a week.     sevelamer carbonate (RENVELA) 800 MG tablet Take 800 mg by mouth 2 (two) times daily.     sodium bicarbonate 650 MG tablet Take 650 mg by mouth 2 (two) times daily.     zolpidem (AMBIEN) 5 MG tablet Take 5 mg by mouth at bedtime.     No facility-administered medications prior to visit.    PAST MEDICAL HISTORY: Past Medical History:  Diagnosis Date   Acute cystitis    Anxiety state, unspecified    Background diabetic retinopathy(362.01)    Bulimia    Calculus of kidney    Cellulitis and abscess of foot 01/24/2015   left foot   Chronic inflammatory demyelinating polyneuritis (HCC)    Chronic kidney disease, stage IV  (severe) (HCC)    Complication of anesthesia    slow to wake up, had lost a lot of blood - 2010   Diarrhea    symptomatic   Disorders of magnesium metabolism    DM2 (diabetes mellitus, type 2) (HCC)    Dysthymic disorder    Dysuria    Edema    moderate   Encounter for long-term (current) use of other medications    Esophageal reflux    Essential hypertension, benign    Fever 10/2018   Heart murmur    been told very slight, never given her any problems   History of blood transfusion X 6-7; 2010 - present (03/29/2014)   "related to OR; kidney issues"   HLD (hyperlipidemia)    HDL goal >50, LDL goal <100   Iron deficiency anemia    "suppose to get procrit injections q 3 wks; usually don't" do it" (03/29/2014)   Irritable bowel syndrome    Left heart failure (HCC)    Muscle weakness (generalized)    Neuralgia, neuritis, and radiculitis, unspecified    Palpitations    Peripheral autonomic neuropathy in disorders classified elsewhere(337.1)    Pneumonia 2010   Primary pulmonary hypertension ([place])    not seen at CATH  (60mm Hg), pt not aware of this   RLS (restless legs syndrome)    Type II or unspecified type diabetes mellitus with other specified manifestations, not stated as uncontrolled    Cardiac catheterization 7/10 at [facility]: Normal coronary arteries   Unspecified essential hypertension    Renal Dopplers 12/23/09 at [place] [facility] in [facility]: No significant renal artery stenosis bilaterally    PAST SURGICAL HISTORY: Past Surgical History:  Procedure Laterality Date   AMPUTATION Left 01/26/2015   Procedure: POST TRANS MET ;  Surgeon: Newt Minion, MD;  Location: [facility];  Service: Orthopedics;  Laterality: Left;   APPENDECTOMY     AV FISTULA PLACEMENT Right 01/08/2015   Procedure: ARTERIOVENOUS (AV) FISTULA CREATION;  Surgeon: Angelia Mould, MD;  Location: [facility];  Service: Vascular;  Laterality: Right;   CESAREAN SECTION  2001; 1999;  [place] AND DRAINAGE OF WOUND Right 2010   "serious staph infection"   PARS PLANA VITRECTOMY Bilateral 2010   "related to DM"   TONSILLECTOMY AND ADENOIDECTOMY      FAMILY HISTORY: Family History  Problem Relation Age of Onset   Hypertension Mother    Myelodysplastic syndrome Father    Pancreatic cancer Other     SOCIAL HISTORY: Social History  Socioeconomic History   Marital status: Single    Spouse name: Not on file   Number of children: 4   Years of education: Not on file   Highest education level: Not on file  Occupational History   Occupation: collections  Tobacco Use   Smoking status: Never   Smokeless tobacco: Never  Vaping Use   Vaping Use: Never used  Substance and Sexual Activity   Alcohol use: Yes    Comment: 03/29/2014 "might have a drink a couple times/yr"   Drug use: No   Sexual activity: Yes  Other Topics Concern   Not on file  Social History Narrative   Not on file   Social Determinants of Health   Financial Resource Strain: Not on file  Food Insecurity: Not on file  Transportation Needs: Not on file  Physical Activity: Not on file  Stress: Not on file  Social Connections: Not on file  Intimate Partner Violence: Not on file     PHYSICAL EXAM  GENERAL EXAM/CONSTITUTIONAL: Vitals:  Vitals:   12/10/20 1254  BP: (!) 175/87  Pulse: 77  Weight: 133 lb (60.3 kg)  Height: $Remove'5\' 6"'RvaIhCN$  (1.676 m)   Body mass index is 21.47 kg/m. Wt Readings from Last 3 Encounters:  12/10/20 133 lb (60.3 kg)  11/15/18 150 lb 9.6 oz (68.3 kg)  11/08/18 144 lb 6.4 oz (65.5 kg)   Patient is in no distress; well developed, nourished and groomed; neck is supple  CARDIOVASCULAR: Examination of carotid arteries is normal; no carotid bruits Regular rate and rhythm, no murmurs Examination of peripheral vascular system by observation and palpation is normal  EYES: Pupils round and reactive to light,  Visual fields full to confrontation, Extraocular movements intacts  MUSCULOSKELETAL: Gait, strength, tone, movements noted in Neurologic exam below  NEUROLOGIC: MENTAL STATUS:  awake, alert, oriented to person, place and time recent and remote memory intact normal attention and concentration language fluent, comprehension intact, naming intact fund of knowledge appropriate  CRANIAL NERVE:  2nd, 3rd, 4th, 6th - pupils equal and reactive to light, visual fields full to confrontation, extraocular muscles intact, no nystagmus 5th - facial sensation symmetric 7th - facial strength symmetric 8th - hearing intact 9th - palate elevates symmetrically, uvula midline 11th - shoulder shrug symmetric 12th - tongue protrusion midline  MOTOR:  normal bulk and tone, full strength in the BUE, BLE  SENSORY:  Diminished sensation to light touch, pinprick, and vibration in bilateral feet up to knees  COORDINATION:  finger-nose-finger, heel-to shin normal  REFLEXES:  deep tendon reflexes present and symmetric    DIAGNOSTIC DATA (LABS, IMAGING, TESTING) - I reviewed patient records, labs, notes, testing and imaging myself where available.  Lab Results  Component Value Date   WBC 5.6 12/04/2020   HGB 11.0 (L) 12/04/2020   HCT 36.5 12/04/2020   MCV 94.6 12/04/2020   PLT 146 (L) 12/04/2020      Component Value Date/Time   NA 135 12/04/2020 1047   K 4.6 12/04/2020 1047   CL 101 12/04/2020 1047   CO2 23 12/04/2020 1047   GLUCOSE 136 (H) 12/04/2020 1047   BUN 93 (H) 12/04/2020 1047   CREATININE 4.33 (H) 12/04/2020 1047   CREATININE 2.89 (H) 07/23/2013 0905   CALCIUM 10.3 12/04/2020 1047   CALCIUM 10.4 (H) 11/21/2020 1310   PROT 6.0 (L) 11/08/2018 0415   ALBUMIN 3.8 11/21/2020 1310   AST 50 (H) 11/08/2018 0415   ALT 43 11/08/2018 0415   ALKPHOS 88  11/08/2018 0415   BILITOT 0.5 11/08/2018 0415   GFRNONAA 11 (L) 12/04/2020 1047   GFRAA 18 (L) 10/06/2019 1055   Lab Results   Component Value Date   CHOL 238 (H) 07/23/2013   HDL 34 (L) 07/23/2013   LDLCALC 170 (H) 07/23/2013   LDLDIRECT  03/28/2008    96 (NOTE) ATP III Classification (LDL):      < 100        mg/dL         Optimal     100 - 129     mg/dL         Near or Above Optimal     130 - 159     mg/dL         Borderline High     160 - 189     mg/dL         High      > 190        mg/dL         Very  High    TRIG 169 (H) 07/23/2013   CHOLHDL 7.0 07/23/2013   Lab Results  Component Value Date   HGBA1C 8.4 (H) 11/06/2018   Lab Results  Component Value Date   WRKYBTVD91 792 11/15/2018   Lab Results  Component Value Date   TSH 3.022 07/23/2013   Iron/TIBC/Ferritin/ %Sat    Component Value Date/Time   IRON 81 11/21/2020 1310   TIBC 283 11/21/2020 1310   FERRITIN 481 (H) 11/21/2020 1310   IRONPCTSAT 29 11/21/2020 1310      ASSESSMENT AND PLAN  55 y.o. year old female with a history of CKD, DM, HTN who presents for evaluation of restless legs at night. Her symptoms are consistent with restless leg syndrome. Iron studies in September were within normal limits. Discussed lifestyle modifications including avoiding caffeine and alcohol. Symptoms are generally well-controlled with ropinirole 1 mg nightly, though she will occasionally require a second dose at bedtime. Will continue her current dose of ropinirole and she can take an extra 0.5-1 pill as needed.    1. Restless leg       PLAN: -Continue ropinirole 1 mg at bedtime, can take extra 0.5-1 mg as needed -next steps: consider low dose gabapentin   Return in about 1 year (around 12/10/2021).    Genia Harold, MD 12/10/20 1:43 PM   I spent an average of 37 minutes chart reviewing and counseling the patient, with at least 50% of the time face to face with the patient.  [facility] [address], [place], [place] [address] [phone]

## 2020-12-10 NOTE — Patient Instructions (Addendum)
Continue ropinirole 1 mg at bedtime. Can take an extra 0.5-1 pill as needed.  RLS info Things that exacerbate Restless Leg Syndrome: Caffeine  Nicotine  SSRI/SNRI medications Alcohol Antihistamines Antipsychotic medications  Anemia Sleep deprivation Pregnancy  Interventions to improve Restless Leg Syndrome: Maintain good sleep hygiene, adequate sleep duration, and a regular sleep/wake schedule  Warm baths Avoid alcohol and caffeine before bed Do not use nicotine  Get regular exercise (though not within a few hours of bedtime)    What is restless legs syndrome (RLS)? Restless legs syndrome (RLS), also called Willis-Ekbom disease, is a sleep disorder in which  there is an uncomfortable and irresistible urge to move his or her legs. This urge usually happens at bedtime but can occur at other times when the legs have been inactive, such as when sitting still for a long period of time (eg, during long car rides or while watching a movie). To relieve the discomfort, you may move your legs, stretches your legs, toss and turn, or get up and walk or runs around. The relief experienced is usually immediate.  What causes restless legs syndrome (RLS)? The exact cause of restless legs syndrome varies. In some cases, the cause is not known. In other children, RLS can be related to a low iron level or sometimes is associated with diabetes, kidney or some neurological diseases. RLS sometimes runs in families and there is thought to be a genetic link in these cases. Many different types of drugs including those used to treat depression, allergies, and psychiatric disorders may cause RLS as a side effect.  What are the symptoms of restless legs syndrome (RLS)? Symptoms of restless legs syndrome include: Leg discomfort or "heebie-jeebies:" Uncomfortable leg sensations often described by adults as creeping, itching, pulling, crawling, tugging, throbbing, burning, or gnawing.  Urge to move legs:  Uncontrollable urge to move their legs, especially when resting, such as when sitting or lying down.  Sleep disruption: Additional time is often needed to fall asleep because of the urge to move the legs to relieve the discomfort. Sometimes staying asleep may also be difficult.  Bedtime behavior problems: Because children have a hard time falling asleep, they may not always stay in bed and sometimes need to get out of bed to stretch their legs to relieve discomfort.  Daytime sleepiness: Problems with falling asleep and staying asleep may result in daytime sleepiness.  How is restless legs syndrome (RLS) diagnosed?  Unfortunately, there is no specific test for restless legs syndrome. Diagnosis is made based on symptoms. A medical history and complete physical exam is conducted to rule out any other possible health problems. A blood test may be ordered to check iron levels. An overnight sleep study may be recommended to evaluate for other sleep disorders, especially periodic limb movement disorder (a movement disorder in which legs kick or twitch during sleep but the child is usually not aware of the symptoms).  The individual must have nearly an irresistible urge to move his or her legs. The urge is often accompanied by uncomfortable sensations described above.  The symptoms start or become worse at rest or inactivity such as when sitting or lying down. The longer the rest period, the greater the chance that symptoms will occur and the more severe they are likely to be.  Symptoms are temporarily relieved when legs are moved. Relief can be complete or partial but only persists as long as legs continue to be moved.  The restless legs symptoms are worse in  the evening or night than during the day or occur only in the evening or night.  Symptoms are not due to another medical or behavioral condition.   How is restless legs syndrome (RLS) treated? Treatment options for RLS can include any of the  following: Get regular exercise: Gentle exercises, such as walking or riding a bike can be tried. Avoid heavy/intense exercise within a few hours of bedtime.  Adopt appropriate bedtime habits: Only get into bed and lay in bed when it is time to go to bed. Do not get into bed and spend time reading, watching television, or playing any games.  Say "No" to caffeine: Caffeine can make RLS worse, so avoid caffeinated products (coffees, teas, colas, chocolates, and some medications).  Use local comfort aids for legs: Apply a heating pad, cold compress, or consider rubbing your legs to provide temporary relief to the discomfort in your legs. Also consider massage, acupressure, walking, light stretching, or other relaxation techniques.  Check iron levels: Have your physician check your iron levels and if necessary, folic acid levels. Low levels of these substances can contribute to restless legs syndrome symptoms. Your doctor may recommend iron or folate supplements.  Consider medication options

## 2020-12-18 ENCOUNTER — Encounter (HOSPITAL_COMMUNITY): Payer: Commercial Managed Care - PPO

## 2020-12-30 ENCOUNTER — Telehealth: Payer: Self-pay | Admitting: Psychiatry

## 2020-12-30 MED ORDER — ROPINIROLE HCL 1 MG PO TABS: 1.0000 mg | ORAL_TABLET | Freq: Every day | ORAL | 2 refills | Status: DC | PRN

## 2020-12-30 NOTE — Telephone Encounter (Signed)
I'll take over her ropinirole prescription. I sent in a refill for her

## 2020-12-30 NOTE — Telephone Encounter (Signed)
Pt called, out of rOPINIRole (REQUIP) 1 MG tablet. Dr Billey Gosling said she would refill after I run out of the medication. Would like a call from the nurse to discuss getting a refill.  Informed pt because another physician prescribed the medication I cannot send a refill.

## 2020-12-30 NOTE — Telephone Encounter (Signed)
Were you taking over management for this mediation? Please advise

## 2020-12-30 NOTE — Telephone Encounter (Signed)
Refill has been faxed to [facility] in [place]

## 2020-12-31 ENCOUNTER — Other Ambulatory Visit: Payer: Self-pay

## 2020-12-31 ENCOUNTER — Encounter (HOSPITAL_COMMUNITY)
Admission: RE | Admit: 2020-12-31 | Discharge: 2020-12-31 | Disposition: A | Discharge status: Home / Self Care | Payer: Commercial Managed Care - PPO | Source: Ambulatory Visit | Attending: Nephrology | Admitting: Nephrology

## 2020-12-31 VITALS — BP 154/74 | HR 76 | Temp 97.0°F | Resp 19

## 2020-12-31 DIAGNOSIS — N184 Chronic kidney disease, stage 4 (severe): Secondary | ICD-10-CM | POA: Diagnosis present

## 2020-12-31 DIAGNOSIS — N179 Acute kidney failure, unspecified: Secondary | ICD-10-CM | POA: Insufficient documentation

## 2020-12-31 LAB — FERRITIN: Ferritin: 476 ng/mL — ABNORMAL HIGH (ref 11–307)

## 2020-12-31 LAB — RENAL FUNCTION PANEL
Albumin: 3.6 g/dL (ref 3.5–5.0)
Anion gap: 12 (ref 5–15)
BUN: 78 mg/dL — ABNORMAL HIGH (ref 6–20)
CO2: 22 mmol/L (ref 22–32)
Calcium: 10.1 mg/dL (ref 8.9–10.3)
Chloride: 102 mmol/L (ref 98–111)
Creatinine, Ser: 4.81 mg/dL — ABNORMAL HIGH (ref 0.44–1.00)
GFR, Estimated: 10 mL/min — ABNORMAL LOW (ref >60)
Glucose, Bld: 232 mg/dL — ABNORMAL HIGH (ref 70–99)
Phosphorus: 5 mg/dL — ABNORMAL HIGH (ref 2.5–4.6)
Potassium: 4.2 mmol/L (ref 3.5–5.1)
Sodium: 136 mmol/L (ref 135–145)

## 2020-12-31 LAB — POCT HEMOGLOBIN-HEMACUE: Hemoglobin: 9.7 g/dL — ABNORMAL LOW (ref 12.0–15.0)

## 2020-12-31 LAB — IRON AND TIBC
Iron: 79 ug/dL (ref 28–170)
Saturation Ratios: 30 % (ref 10.4–31.8)
TIBC: 265 ug/dL (ref 250–450)
UIBC: 186 ug/dL

## 2020-12-31 MED ORDER — EPOETIN ALFA 40000 UNIT/ML IJ SOLN
INTRAMUSCULAR | Status: AC
  Filled 2020-12-31: qty 1

## 2020-12-31 MED ORDER — EPOETIN ALFA 40000 UNIT/ML IJ SOLN
40000.0000 [IU] | INTRAMUSCULAR | Status: DC
  Administered 2020-12-31: 40000 [IU] via SUBCUTANEOUS

## 2021-01-01 LAB — PTH, INTACT AND CALCIUM
Calcium, Total (PTH): 10.3 mg/dL — ABNORMAL HIGH (ref 8.7–10.2)
PTH: 364 pg/mL — ABNORMAL HIGH (ref 15–65)

## 2021-01-28 ENCOUNTER — Encounter (HOSPITAL_COMMUNITY)
Admission: RE | Admit: 2021-01-28 | Discharge: 2021-01-28 | Disposition: A | Discharge status: Home / Self Care | Payer: Commercial Managed Care - PPO | Source: Ambulatory Visit | Attending: Nephrology | Admitting: Nephrology

## 2021-01-28 ENCOUNTER — Other Ambulatory Visit: Payer: Self-pay

## 2021-01-28 VITALS — BP 161/75 | HR 76 | Temp 97.3°F | Resp 20

## 2021-01-28 DIAGNOSIS — N179 Acute kidney failure, unspecified: Secondary | ICD-10-CM | POA: Insufficient documentation

## 2021-01-28 DIAGNOSIS — N184 Chronic kidney disease, stage 4 (severe): Secondary | ICD-10-CM | POA: Diagnosis present

## 2021-01-28 LAB — RENAL FUNCTION PANEL
Albumin: 3.2 g/dL — ABNORMAL LOW (ref 3.5–5.0)
Anion gap: 10 (ref 5–15)
BUN: 71 mg/dL — ABNORMAL HIGH (ref 6–20)
CO2: 22 mmol/L (ref 22–32)
Calcium: 9.5 mg/dL (ref 8.9–10.3)
Chloride: 104 mmol/L (ref 98–111)
Creatinine, Ser: 4.65 mg/dL — ABNORMAL HIGH (ref 0.44–1.00)
GFR, Estimated: 10 mL/min — ABNORMAL LOW (ref >60)
Glucose, Bld: 166 mg/dL — ABNORMAL HIGH (ref 70–99)
Phosphorus: 5 mg/dL — ABNORMAL HIGH (ref 2.5–4.6)
Potassium: 4.9 mmol/L (ref 3.5–5.1)
Sodium: 136 mmol/L (ref 135–145)

## 2021-01-28 LAB — IRON AND TIBC
Iron: 68 ug/dL (ref 28–170)
Saturation Ratios: 29 % (ref 10.4–31.8)
TIBC: 237 ug/dL — ABNORMAL LOW (ref 250–450)
UIBC: 169 ug/dL

## 2021-01-28 LAB — FERRITIN: Ferritin: 430 ng/mL — ABNORMAL HIGH (ref 11–307)

## 2021-01-28 LAB — POCT HEMOGLOBIN-HEMACUE: Hemoglobin: 9.4 g/dL — ABNORMAL LOW (ref 12.0–15.0)

## 2021-01-28 MED ORDER — EPOETIN ALFA 40000 UNIT/ML IJ SOLN
INTRAMUSCULAR | Status: AC
  Administered 2021-01-28: 40000 [IU] via SUBCUTANEOUS
  Filled 2021-01-28: qty 1

## 2021-01-28 MED ORDER — EPOETIN ALFA 40000 UNIT/ML IJ SOLN: 40000.0000 [IU] | INTRAMUSCULAR | Status: DC

## 2021-01-29 LAB — PTH, INTACT AND CALCIUM
Calcium, Total (PTH): 9.6 mg/dL (ref 8.7–10.2)
PTH: 277 pg/mL — ABNORMAL HIGH (ref 15–65)

## 2021-02-25 ENCOUNTER — Encounter (HOSPITAL_COMMUNITY): Payer: Commercial Managed Care - PPO

## 2021-03-03 ENCOUNTER — Ambulatory Visit (HOSPITAL_COMMUNITY)
Admission: RE | Admit: 2021-03-03 | Discharge: 2021-03-03 | Disposition: A | Discharge status: Home / Self Care | Payer: Commercial Managed Care - PPO | Source: Ambulatory Visit | Attending: Nephrology | Admitting: Nephrology

## 2021-03-03 ENCOUNTER — Other Ambulatory Visit: Payer: Self-pay

## 2021-03-03 VITALS — BP 144/71 | HR 79 | Resp 20

## 2021-03-03 DIAGNOSIS — N184 Chronic kidney disease, stage 4 (severe): Secondary | ICD-10-CM | POA: Diagnosis present

## 2021-03-03 DIAGNOSIS — N179 Acute kidney failure, unspecified: Secondary | ICD-10-CM | POA: Diagnosis present

## 2021-03-03 LAB — POCT HEMOGLOBIN-HEMACUE: Hemoglobin: 9.3 g/dL — ABNORMAL LOW (ref 12.0–15.0)

## 2021-03-03 LAB — RENAL FUNCTION PANEL
Albumin: 3.5 g/dL (ref 3.5–5.0)
Anion gap: 12 (ref 5–15)
BUN: 93 mg/dL — ABNORMAL HIGH (ref 6–20)
CO2: 20 mmol/L — ABNORMAL LOW (ref 22–32)
Calcium: 9.9 mg/dL (ref 8.9–10.3)
Chloride: 105 mmol/L (ref 98–111)
Creatinine, Ser: 6.17 mg/dL — ABNORMAL HIGH (ref 0.44–1.00)
GFR, Estimated: 7 mL/min — ABNORMAL LOW (ref >60)
Glucose, Bld: 188 mg/dL — ABNORMAL HIGH (ref 70–99)
Phosphorus: 6.3 mg/dL — ABNORMAL HIGH (ref 2.5–4.6)
Potassium: 5.9 mmol/L — ABNORMAL HIGH (ref 3.5–5.1)
Sodium: 137 mmol/L (ref 135–145)

## 2021-03-03 LAB — FERRITIN: Ferritin: 321 ng/mL — ABNORMAL HIGH (ref 11–307)

## 2021-03-03 LAB — IRON AND TIBC
Iron: 50 ug/dL (ref 28–170)
Saturation Ratios: 20 % (ref 10.4–31.8)
TIBC: 252 ug/dL (ref 250–450)
UIBC: 202 ug/dL

## 2021-03-03 MED ORDER — EPOETIN ALFA 40000 UNIT/ML IJ SOLN
40000.0000 [IU] | INTRAMUSCULAR | Status: DC
  Administered 2021-03-03: 40000 [IU] via SUBCUTANEOUS

## 2021-03-03 MED ORDER — EPOETIN ALFA 40000 UNIT/ML IJ SOLN
INTRAMUSCULAR | Status: AC
  Filled 2021-03-03: qty 1

## 2021-03-04 LAB — PTH, INTACT AND CALCIUM
Calcium, Total (PTH): 9.9 mg/dL (ref 8.7–10.2)
PTH: 299 pg/mL — ABNORMAL HIGH (ref 15–65)

## 2021-03-09 ENCOUNTER — Other Ambulatory Visit: Payer: Self-pay | Admitting: Psychiatry

## 2021-03-19 ENCOUNTER — Other Ambulatory Visit: Payer: Self-pay | Admitting: Psychiatry

## 2021-03-31 ENCOUNTER — Encounter (HOSPITAL_COMMUNITY): Admission: RE | Admit: 2021-03-31 | Payer: Commercial Managed Care - PPO | Source: Ambulatory Visit

## 2021-04-08 ENCOUNTER — Encounter (HOSPITAL_COMMUNITY)
Admission: RE | Admit: 2021-04-08 | Discharge: 2021-04-08 | Disposition: A | Discharge status: Home / Self Care | Payer: Commercial Managed Care - PPO | Source: Ambulatory Visit | Attending: Nephrology | Admitting: Nephrology

## 2021-04-08 VITALS — BP 154/76 | HR 73 | Temp 97.2°F | Resp 20

## 2021-04-08 DIAGNOSIS — N184 Chronic kidney disease, stage 4 (severe): Secondary | ICD-10-CM | POA: Insufficient documentation

## 2021-04-08 DIAGNOSIS — N179 Acute kidney failure, unspecified: Secondary | ICD-10-CM | POA: Diagnosis present

## 2021-04-08 LAB — RENAL FUNCTION PANEL
Albumin: 3.4 g/dL — ABNORMAL LOW (ref 3.5–5.0)
Anion gap: 15 (ref 5–15)
BUN: 118 mg/dL — ABNORMAL HIGH (ref 6–20)
CO2: 20 mmol/L — ABNORMAL LOW (ref 22–32)
Calcium: 9.6 mg/dL (ref 8.9–10.3)
Chloride: 104 mmol/L (ref 98–111)
Creatinine, Ser: 8.24 mg/dL — ABNORMAL HIGH (ref 0.44–1.00)
GFR, Estimated: 5 mL/min — ABNORMAL LOW (ref >60)
Glucose, Bld: 118 mg/dL — ABNORMAL HIGH (ref 70–99)
Phosphorus: 8.6 mg/dL — ABNORMAL HIGH (ref 2.5–4.6)
Potassium: 4.8 mmol/L (ref 3.5–5.1)
Sodium: 139 mmol/L (ref 135–145)

## 2021-04-08 LAB — IRON AND TIBC
Iron: 62 ug/dL (ref 28–170)
Saturation Ratios: 27 % (ref 10.4–31.8)
TIBC: 230 ug/dL — ABNORMAL LOW (ref 250–450)
UIBC: 168 ug/dL

## 2021-04-08 LAB — FERRITIN: Ferritin: 422 ng/mL — ABNORMAL HIGH (ref 11–307)

## 2021-04-08 LAB — POCT HEMOGLOBIN-HEMACUE: Hemoglobin: 8.9 g/dL — ABNORMAL LOW (ref 12.0–15.0)

## 2021-04-08 MED ORDER — EPOETIN ALFA 40000 UNIT/ML IJ SOLN
INTRAMUSCULAR | Status: AC
  Filled 2021-04-08: qty 1

## 2021-04-08 MED ORDER — EPOETIN ALFA 40000 UNIT/ML IJ SOLN
40000.0000 [IU] | INTRAMUSCULAR | Status: DC
  Administered 2021-04-08: 40000 [IU] via SUBCUTANEOUS

## 2021-04-09 LAB — PTH, INTACT AND CALCIUM
Calcium, Total (PTH): 9.2 mg/dL (ref 8.7–10.2)
PTH: 532 pg/mL — ABNORMAL HIGH (ref 15–65)

## 2021-04-15 ENCOUNTER — Inpatient Hospital Stay (HOSPITAL_COMMUNITY): Payer: Commercial Managed Care - PPO

## 2021-04-15 ENCOUNTER — Encounter (HOSPITAL_COMMUNITY): Payer: Self-pay

## 2021-04-15 ENCOUNTER — Other Ambulatory Visit: Payer: Self-pay

## 2021-04-15 ENCOUNTER — Inpatient Hospital Stay (HOSPITAL_COMMUNITY)
Admission: EM | Admit: 2021-04-15 | Discharge: 2021-04-20 | DRG: 683 | Disposition: A | Discharge status: Home / Self Care | Payer: Commercial Managed Care - PPO | Attending: Internal Medicine | Admitting: Internal Medicine

## 2021-04-15 DIAGNOSIS — Z79899 Other long term (current) drug therapy: Secondary | ICD-10-CM | POA: Diagnosis not present

## 2021-04-15 DIAGNOSIS — E1122 Type 2 diabetes mellitus with diabetic chronic kidney disease: Secondary | ICD-10-CM | POA: Diagnosis present

## 2021-04-15 DIAGNOSIS — N179 Acute kidney failure, unspecified: Secondary | ICD-10-CM | POA: Diagnosis present

## 2021-04-15 DIAGNOSIS — Z888 Allergy status to other drugs, medicaments and biological substances status: Secondary | ICD-10-CM | POA: Diagnosis not present

## 2021-04-15 DIAGNOSIS — Z89432 Acquired absence of left foot: Secondary | ICD-10-CM | POA: Diagnosis not present

## 2021-04-15 DIAGNOSIS — E1142 Type 2 diabetes mellitus with diabetic polyneuropathy: Secondary | ICD-10-CM | POA: Diagnosis present

## 2021-04-15 DIAGNOSIS — R339 Retention of urine, unspecified: Secondary | ICD-10-CM

## 2021-04-15 DIAGNOSIS — E11621 Type 2 diabetes mellitus with foot ulcer: Secondary | ICD-10-CM | POA: Diagnosis present

## 2021-04-15 DIAGNOSIS — I152 Hypertension secondary to endocrine disorders: Secondary | ICD-10-CM | POA: Diagnosis present

## 2021-04-15 DIAGNOSIS — I480 Paroxysmal atrial fibrillation: Secondary | ICD-10-CM | POA: Diagnosis present

## 2021-04-15 DIAGNOSIS — E872 Acidosis, unspecified: Secondary | ICD-10-CM | POA: Diagnosis present

## 2021-04-15 DIAGNOSIS — E1159 Type 2 diabetes mellitus with other circulatory complications: Secondary | ICD-10-CM | POA: Diagnosis present

## 2021-04-15 DIAGNOSIS — Z794 Long term (current) use of insulin: Secondary | ICD-10-CM

## 2021-04-15 DIAGNOSIS — D631 Anemia in chronic kidney disease: Secondary | ICD-10-CM | POA: Diagnosis present

## 2021-04-15 DIAGNOSIS — N2581 Secondary hyperparathyroidism of renal origin: Secondary | ICD-10-CM | POA: Diagnosis present

## 2021-04-15 DIAGNOSIS — K219 Gastro-esophageal reflux disease without esophagitis: Secondary | ICD-10-CM | POA: Diagnosis present

## 2021-04-15 DIAGNOSIS — E11319 Type 2 diabetes mellitus with unspecified diabetic retinopathy without macular edema: Secondary | ICD-10-CM | POA: Diagnosis present

## 2021-04-15 DIAGNOSIS — Z8249 Family history of ischemic heart disease and other diseases of the circulatory system: Secondary | ICD-10-CM | POA: Diagnosis not present

## 2021-04-15 DIAGNOSIS — R338 Other retention of urine: Secondary | ICD-10-CM | POA: Diagnosis present

## 2021-04-15 DIAGNOSIS — E785 Hyperlipidemia, unspecified: Secondary | ICD-10-CM | POA: Diagnosis present

## 2021-04-15 DIAGNOSIS — F32A Depression, unspecified: Secondary | ICD-10-CM | POA: Diagnosis present

## 2021-04-15 DIAGNOSIS — L97519 Non-pressure chronic ulcer of other part of right foot with unspecified severity: Secondary | ICD-10-CM | POA: Diagnosis present

## 2021-04-15 DIAGNOSIS — Z882 Allergy status to sulfonamides status: Secondary | ICD-10-CM

## 2021-04-15 DIAGNOSIS — N184 Chronic kidney disease, stage 4 (severe): Secondary | ICD-10-CM

## 2021-04-15 DIAGNOSIS — I4891 Unspecified atrial fibrillation: Secondary | ICD-10-CM | POA: Diagnosis not present

## 2021-04-15 DIAGNOSIS — N186 End stage renal disease: Secondary | ICD-10-CM | POA: Diagnosis present

## 2021-04-15 DIAGNOSIS — M898X9 Other specified disorders of bone, unspecified site: Secondary | ICD-10-CM | POA: Diagnosis present

## 2021-04-15 DIAGNOSIS — R600 Localized edema: Secondary | ICD-10-CM

## 2021-04-15 DIAGNOSIS — G2581 Restless legs syndrome: Secondary | ICD-10-CM | POA: Diagnosis present

## 2021-04-15 DIAGNOSIS — E10621 Type 1 diabetes mellitus with foot ulcer: Secondary | ICD-10-CM | POA: Diagnosis not present

## 2021-04-15 DIAGNOSIS — R609 Edema, unspecified: Principal | ICD-10-CM

## 2021-04-15 DIAGNOSIS — E1165 Type 2 diabetes mellitus with hyperglycemia: Secondary | ICD-10-CM | POA: Diagnosis present

## 2021-04-15 LAB — COMPREHENSIVE METABOLIC PANEL
ALT: 17 U/L (ref 0–44)
AST: 15 U/L (ref 15–41)
Albumin: 3.3 g/dL — ABNORMAL LOW (ref 3.5–5.0)
Alkaline Phosphatase: 92 U/L (ref 38–126)
Anion gap: 19 — ABNORMAL HIGH (ref 5–15)
BUN: 135 mg/dL — ABNORMAL HIGH (ref 6–20)
CO2: 18 mmol/L — ABNORMAL LOW (ref 22–32)
Calcium: 9 mg/dL (ref 8.9–10.3)
Chloride: 102 mmol/L (ref 98–111)
Creatinine, Ser: 12.15 mg/dL — ABNORMAL HIGH (ref 0.44–1.00)
GFR, Estimated: 3 mL/min — ABNORMAL LOW (ref >60)
Glucose, Bld: 234 mg/dL — ABNORMAL HIGH (ref 70–99)
Potassium: 4.8 mmol/L (ref 3.5–5.1)
Sodium: 139 mmol/L (ref 135–145)
Total Bilirubin: 0.6 mg/dL (ref 0.3–1.2)
Total Protein: 5.9 g/dL — ABNORMAL LOW (ref 6.5–8.1)

## 2021-04-15 LAB — CBC WITH DIFFERENTIAL/PLATELET
Abs Immature Granulocytes: 0.02 10*3/uL (ref 0.00–0.07)
Basophils Absolute: 0 10*3/uL (ref 0.0–0.1)
Basophils Relative: 0 %
Eosinophils Absolute: 0.3 10*3/uL (ref 0.0–0.5)
Eosinophils Relative: 5 %
HCT: 28.3 % — ABNORMAL LOW (ref 36.0–46.0)
Hemoglobin: 8.5 g/dL — ABNORMAL LOW (ref 12.0–15.0)
Immature Granulocytes: 0 %
Lymphocytes Relative: 9 %
Lymphs Abs: 0.6 10*3/uL — ABNORMAL LOW (ref 0.7–4.0)
MCH: 27.6 pg (ref 26.0–34.0)
MCHC: 30 g/dL (ref 30.0–36.0)
MCV: 91.9 fL (ref 80.0–100.0)
Monocytes Absolute: 0.5 10*3/uL (ref 0.1–1.0)
Monocytes Relative: 7 %
Neutro Abs: 5 10*3/uL (ref 1.7–7.7)
Neutrophils Relative %: 79 %
Platelets: 163 10*3/uL (ref 150–400)
RBC: 3.08 MIL/uL — ABNORMAL LOW (ref 3.87–5.11)
RDW: 14.9 % (ref 11.5–15.5)
WBC: 6.5 10*3/uL (ref 4.0–10.5)
nRBC: 0 % (ref 0.0–0.2)

## 2021-04-15 LAB — URINALYSIS, ROUTINE W REFLEX MICROSCOPIC
Bilirubin Urine: NEGATIVE
Glucose, UA: 50 mg/dL — AB
Ketones, ur: NEGATIVE mg/dL
Nitrite: NEGATIVE
Protein, ur: 100 mg/dL — AB
Specific Gravity, Urine: 1.01 (ref 1.005–1.030)
WBC, UA: >50 WBC/hpf — ABNORMAL HIGH (ref 0–5)
pH: 6 (ref 5.0–8.0)

## 2021-04-15 LAB — LIPASE, BLOOD: Lipase: 27 U/L (ref 11–51)

## 2021-04-15 NOTE — ED Triage Notes (Signed)
Pt reports she is starting dialysis soon but came to ED because she has noticed swelling to her abdomen and bilateral legs x 2 weeks. She also reports she has not urinated in 4 days and reports feeling pressure to her abdomen.  ? ?Dry weight is 133 lbs. Pt weighed in triage and is 161 lb. ?

## 2021-04-15 NOTE — ED Notes (Signed)
Pt steadily ambulated to bathroom, no acute distress noted. ?

## 2021-04-15 NOTE — ED Provider Triage Note (Signed)
Emergency Medicine Provider Triage Evaluation Note ? ?Holly [name] , a 56 y.o. female  was evaluated in triage.  Pt complains of weight gain. History of CKD, states that she has been told that she will need dialysis but has not started this yet.  States that over the past 2 weeks she has noticed swelling in her bilateral lower extremities and her abdomen with associated abdominal fullness.  She states that in the past 4 days she has only urinated once. Was sent here by her nephrologist for evaluation. ? ?Review of Systems  ?Positive:  ?Negative: See above ? ?Physical Exam  ?BP 135/66 (BP Location: Left Arm)   Pulse 80   Temp 97.6 ?F (36.4 ?C) (Oral)   Resp 18   Wt 73.3 kg   LMP 07/17/2011   SpO2 97%   BMI 26.08 kg/m?  ?Gen:   Awake, no distress  ?Resp:  Normal effort  ?MSK:   Moves extremities without difficulty  ?Other:   ? ?Medical Decision Making  ?Medically screening exam initiated at 4:03 PM.  Appropriate orders placed.  Holly [name] was informed that the remainder of the evaluation will be completed by another provider, this initial triage assessment does not replace that evaluation, and the importance of remaining in the ED until their evaluation is complete. ? ? ?  ?Bud Face, PA-C ?04/15/21 1605 ? ?

## 2021-04-15 NOTE — ED Notes (Signed)
Bladder scan > 742ml, MD [place] notified. ?

## 2021-04-15 NOTE — H&P (Signed)
?History and Physical  ? ? ?[name] TIW:[id number] DOB: Feb 26, 1965 DOA: 04/15/2021 ? ?PCP: [facility] [place]  ?Patient coming from: Home ? ?I have personally briefly reviewed patient's old medical records in [place] ? ?Chief Complaint: Abdominal and lower extremity swelling ? ?HPI: ?Holly [name] is a 56 y.o. female with medical history significant for CKD stage IV, insulin-dependent T2DM, HTN, HLD, anemia of chronic kidney disease, left foot abscess s/p transmetatarsal amputation, RLS, and depression/anxiety who presented to the ED for evaluation of abdominal and lower extremity swelling. ? ?Patient reports 2 weeks of progressive fluid retention with lower extremity swelling and some abdominal swelling.  She has had progressively decreased urine output with only very small amount of urine output the last 24 hours.  She has not had any associated dyspnea, chest pain, nausea, vomiting.  She does report some recent loose stools.  She states that she does not usually have urge to urinate which she attributes to neuropathy. ? ?Patient states that her nephrology team are planning on starting outpatient dialysis soon. ? ?ED Course  Labs/Imaging on admission: I have personally reviewed following labs and imaging studies. ? ?Initial vitals showed BP 161/73, pulse 68, RR 18, temp 97.6 ?F, SPO2 97% on room air. ? ?Labs show creatinine 12.15 (rising from around 4 > 6 > 8 last 4 months) BUN 135, bicarb 18, sodium 139, potassium 4.8, serum glucose 234, LFTs within normal limits, lipase 27, WBC 6.5, hemoglobin 8.5, platelets 163,000. ? ?Urinalysis shows 100 protein, negative nitrates, large leukocytes, 0-5 RBC/hpf, >50 WBC/hpf, few bacteria on microscopy. ? ?Bladder scan showed >700 mL.  Foley catheter was ordered to be placed.  EDP discussed with on-call nephrology who recommended admission to initiate dialysis.  The hospitalist service was consulted to admit for further evaluation and management. ? ?Review of  Systems: All systems reviewed and are negative except as documented in history of present illness above. ? ? ?Past Medical History:  ?Diagnosis Date  ? Acute cystitis   ? Anxiety state, unspecified   ? Background diabetic retinopathy(362.01)   ? Bulimia   ? Calculus of kidney   ? Cellulitis and abscess of foot 01/24/2015  ? left foot  ? Chronic inflammatory demyelinating polyneuritis (HCC)   ? Chronic kidney disease, stage IV (severe) ([place])   ? Complication of anesthesia   ? slow to wake up, had lost a lot of blood - 2010  ? Diarrhea   ? symptomatic  ? Disorders of magnesium metabolism   ? DM2 (diabetes mellitus, type 2) ([place])   ? Dysthymic disorder   ? Dysuria   ? Edema   ? moderate  ? Encounter for long-term (current) use of other medications   ? Esophageal reflux   ? Essential hypertension, benign   ? Fever 10/2018  ? Heart murmur   ? been told very slight, never given her any problems  ? History of blood transfusion X 6-7; 2010 - present (03/29/2014)  ? "related to OR; kidney issues"  ? HLD (hyperlipidemia)   ? HDL goal >50, LDL goal <100  ? Iron deficiency anemia   ? "suppose to get procrit injections q 3 wks; usually don't" do it" (03/29/2014)  ? Irritable bowel syndrome   ? Left heart failure ([place])   ? Muscle weakness (generalized)   ? Neuralgia, neuritis, and radiculitis, unspecified   ? Palpitations   ? Peripheral autonomic neuropathy in disorders classified elsewhere(337.1)   ? Pneumonia 2010  ? Primary pulmonary hypertension (  [place])   ? not seen at CATH  (24m Hg), pt not aware of this  ? RLS (restless legs syndrome)   ? Type II or unspecified type diabetes mellitus with other specified manifestations, not stated as uncontrolled   ? [facility] Normal coronary arteries  ? Unspecified essential hypertension   ? [facility] No significant renal artery stenosis bilaterally  ? ? ?Past Surgical History:   ?Procedure Laterality Date  ? AMPUTATION Left 01/26/2015  ? Procedure: POST TRANS MET ;  Surgeon: MNewt Minion MD;  [place]  Service: [place]opedics;  Laterality: Left;  ? APPENDECTOMY    ? AV FISTULA PLACEMENT Right 01/08/2015  ? Procedure: ARTERIOVENOUS (AV) FISTULA CREATION;  Surgeon: CAngelia Mould MD;  [place]  Service: Vascular;  Laterality: Right;  ? CESAREAN SECTION  2001; 1999; 1994  ? DILATION AND CURETTAGE OF UTERUS    ? EYE SURGERY    ? INCISION AND DRAINAGE OF WOUND Right 2010  ? "serious staph infection"  ? PARS PLANA VITRECTOMY Bilateral 2010  ? "related to DM"  ? TONSILLECTOMY AND ADENOIDECTOMY    ? ? ?Social History: ? reports that she has never smoked. She has never used smokeless tobacco. She reports current alcohol use. She reports that she does not use drugs. ? ?Allergies  ?Allergen Reactions  ? Bactrim Nausea And Vomiting  ? Byetta 10 Mcg Pen [Exenatide] Nausea And Vomiting  ? Hctz [Hydrochlorothiazide] Other (See Comments) and Itching  ?  dizziness ?dizziness  ? Spironolactone Other (See Comments)  ?  Dizziness   ? Sulfa Antibiotics Nausea And Vomiting  ? Sulfacetamide Sodium Nausea And Vomiting  ? Sulfamethoxazole-Trimethoprim Nausea And Vomiting  ? ? ?Family History  ?Problem Relation Age of Onset  ? Hypertension Mother   ? Myelodysplastic syndrome Father   ? Pancreatic cancer Other   ? ? ? ?Prior to Admission medications   ?Medication Sig Start Date End Date Taking? Authorizing Provider  ?felodipine (PLENDIL) 5 MG 24 hr tablet Take 5 mg by mouth at bedtime. 12/16/14  Yes [provider]  ?FLUoxetine (PROZAC) 40 MG capsule Take 40 mg by mouth every morning.    Yes [provider]  ?furosemide (LASIX) 40 MG tablet Take 80-120 mg by mouth See admin instructions. 120 mg bid ?May take 80 mg bid prn swelling 11/28/20  Yes [provider]  ?insulin glargine, 1 Unit Dial, (TOUJEO SOLOSTAR) 300 UNIT/ML Solostar Pen Inject 10 Units into the skin  daily. 03/10/21  Yes [provider]  ?labetalol (NORMODYNE) 200 MG tablet Take 200 mg by mouth 2 (two) times daily. 12/06/20  Yes [provider]  ?loperamide (IMODIUM A-D) 2 MG tablet Take 2-4 mg by mouth 4 (four) times daily as needed for diarrhea or loose stools.   Yes [provider]  ?metolazone (ZAROXOLYN) 5 MG tablet Take 5 mg by mouth daily.   Yes [provider]  ?rOPINIRole (REQUIP) 1 MG tablet TAKE 1 TABLET EVERY DAY AS NEEDED ?Patient taking differently: Take 1 mg by mouth at bedtime. 03/20/21  Yes CGenia Harold MD  ?Semaglutide, 1 MG/DOSE, (OZEMPIC, 1 MG/DOSE,) 4 MG/3ML SOPN Inject 1 mg into the skin every Wednesday. 02/20/21  Yes [provider]  ?sevelamer carbonate (RENVELA) 800 MG tablet Take 800 mg by mouth 3 (three) times daily with meals. 11/28/20  Yes [provider]  ?sodium bicarbonate 650 MG tablet  Take 1,300 mg by mouth 2 (two) times daily. 05/30/18  Yes [provider]  ?zolpidem (AMBIEN) 5 MG tablet Take 5 mg by mouth at bedtime.   Yes [provider]  ?glucose blood (ONETOUCH VERIO) test strip 1 each by Other route 2 (two) times daily. And lancets 2/day 250/.61 05/25/12   Renato Shin, MD  ?rOPINIRole (REQUIP) 1 MG tablet Take 1 tablet (1 mg total) by mouth daily as needed. 09/20/20   Mesner, Corene Cornea, MD  ? ? ?Physical Exam: ?Vitals:  ? 04/15/21 1547 04/15/21 2044 04/15/21 2205 04/15/21 2315  ?BP:  (!) 161/73 (!) 165/78 (!) 175/83  ?Pulse:  68 68 68  ?Resp:  '18 16 11  ' ?Temp:      ?TempSrc:      ?SpO2:  98% 100% 99%  ?Weight: 73.3 kg     ? ?Constitutional: Resting in bed, NAD, calm, comfortable ?Eyes: PERRL, lids and conjunctivae normal ?ENMT: Mucous membranes are moist. Posterior pharynx clear of any exudate or lesions.Normal dentition.  ?Neck: normal, supple, no masses. ?Respiratory: clear to auscultation bilaterally, no wheezing, no crackles. Normal respiratory effort. No accessory muscle use.  ?Cardiovascular: Regular  rate and rhythm, no murmurs / rubs / gallops.  +2 bilateral lower extremity edema. 2+ pedal pulses.  RUE aVF with palpable thrill. ?Abdomen: no tenderness, no masses palpated. No hepatosplenomegaly. Bowel sounds posi

## 2021-04-15 NOTE — ED Provider Notes (Signed)
?[facility] ?Provider Note ? ? ?CSN: [id number] ?Arrival date & time: 04/15/21  1519 ? ?  ? ?History ? ?Chief Complaint  ?Patient presents with  ? Edema  ? ? ?Holly [name] is a 56 y.o. female. ? ?Pt is a 56 yo female with a hx of CKD, bulemia, anxiety, htn, gerd, hyperlipidemia, ibs and RLS.  Pt is followed by [facility].  She has an AVF in her right arm and is scheduled to start dialysis soon.  She has had an increase in weight and swelling in her legs and abdomen.  She has urinated once in the past 4 days.  She has not urinated since yesterday morning.  She does not feel the urge to urinate.   ? ? ?  ? ?Home Medications ?Prior to Admission medications   ?Medication Sig Start Date End Date Taking? Authorizing Provider  ?felodipine (PLENDIL) 5 MG 24 hr tablet Take 5 mg by mouth at bedtime. 12/16/14  Yes [provider]  ?FLUoxetine (PROZAC) 40 MG capsule Take 40 mg by mouth every morning.    Yes [provider]  ?furosemide (LASIX) 40 MG tablet Take 80-120 mg by mouth See admin instructions. 120 mg bid ?May take 80 mg bid prn swelling 11/28/20  Yes [provider]  ?insulin glargine, 1 Unit Dial, (TOUJEO SOLOSTAR) 300 UNIT/ML Solostar Pen Inject 10 Units into the skin daily. 03/10/21  Yes [provider]  ?labetalol (NORMODYNE) 200 MG tablet Take 200 mg by mouth 2 (two) times daily. 12/06/20  Yes [provider]  ?loperamide (IMODIUM A-D) 2 MG tablet Take 2-4 mg by mouth 4 (four) times daily as needed for diarrhea or loose stools.   Yes [provider]  ?MELATONIN GUMMIES PO Take 2 tablets by mouth at bedtime. Unsure of dose   Yes [provider]  ?metolazone (ZAROXOLYN) 5 MG tablet Take 5 mg by mouth daily.   Yes [provider]  ?rOPINIRole (REQUIP) 1 MG tablet TAKE 1 TABLET EVERY DAY AS NEEDED ?Patient taking differently: Take 1 mg by mouth at bedtime. 03/20/21  Yes Genia Harold, MD  ?Semaglutide,  1 MG/DOSE, (OZEMPIC, 1 MG/DOSE,) 4 MG/3ML SOPN Inject 1 mg into the skin every Wednesday. 02/20/21  Yes [provider]  ?sevelamer carbonate (RENVELA) 800 MG tablet Take 800 mg by mouth 3 (three) times daily with meals. 11/28/20  Yes [provider]  ?silver sulfADIAZINE (SILVADENE) 1 % cream Apply 1 application. topically daily. Right foot   Yes [provider]  ?sodium bicarbonate 650 MG tablet Take 1,300 mg by mouth 2 (two) times daily. 05/30/18  Yes [provider]  ?zolpidem (AMBIEN) 5 MG tablet Take 5 mg by mouth at bedtime.   Yes [provider]  ?glucose blood (ONETOUCH VERIO) test strip 1 each by Other route 2 (two) times daily. And lancets 2/day 250/.61 05/25/12   Renato Shin, MD  ?rOPINIRole (REQUIP) 1 MG tablet Take 1 tablet (1 mg total) by mouth daily as needed. 09/20/20   Mesner, Corene Cornea, MD  ?   ? ?Allergies    ?Bactrim, Byetta 10 mcg pen [exenatide], Hctz [hydrochlorothiazide], Spironolactone, Sulfa antibiotics, Sulfacetamide sodium, and Sulfamethoxazole-trimethoprim   ? ?Review of Systems   ?Review of Systems  ?Cardiovascular:  Positive for leg swelling.  ?Neurological:  Positive for weakness.  ?All other systems reviewed and are negative. ? ?Physical Exam ?Updated Vital Signs ?BP (!) 175/83   Pulse 68   Temp 97.6 ?F (36.4 ?  C) (Oral)   Resp 11   Wt 73.3 kg   LMP 07/17/2011   SpO2 99%   BMI 26.08 kg/m?  ?Physical Exam ?Vitals and nursing note reviewed.  ?Constitutional:   ?   Appearance: Normal appearance.  ?HENT:  ?   Head: Normocephalic and atraumatic.  ?   Right Ear: External ear normal.  ?   Left Ear: External ear normal.  ?   Nose: Nose normal.  ?   Mouth/Throat:  ?   Mouth: Mucous membranes are moist.  ?   Pharynx: Oropharynx is clear.  ?Eyes:  ?   Extraocular Movements: Extraocular movements intact.  ?   Conjunctiva/sclera: Conjunctivae normal.  ?   Pupils: Pupils are equal, round, and reactive to light.  ?Cardiovascular:  ?   Rate and Rhythm:  Normal rate and regular rhythm.  ?   Pulses: Normal pulses.  ?   Heart sounds: Normal heart sounds.  ?Pulmonary:  ?   Effort: Pulmonary effort is normal.  ?   Breath sounds: Normal breath sounds.  ?Abdominal:  ?   General: Abdomen is flat. Bowel sounds are normal.  ?   Palpations: Abdomen is soft.  ?Musculoskeletal:  ?   Cervical back: Normal range of motion and neck supple.  ?   Right lower leg: Edema present.  ?   Left lower leg: Edema present.  ?   Comments: Left foot transmetatarsal amputation  ?Skin: ?   General: Skin is warm.  ?   Capillary Refill: Capillary refill takes less than 2 seconds.  ?Neurological:  ?   General: No focal deficit present.  ?   Mental Status: She is alert and oriented to person, place, and time.  ?Psychiatric:     ?   Mood and Affect: Mood normal.     ?   Behavior: Behavior normal.  ? ? ?ED Results / Procedures / Treatments   ?Labs ?(all labs ordered are listed, but only abnormal results are displayed) ?Labs Reviewed  ?CBC WITH DIFFERENTIAL/PLATELET - Abnormal; Notable for the following components:  ?    Result Value  ? RBC 3.08 (*)   ? Hemoglobin 8.5 (*)   ? HCT 28.3 (*)   ? Lymphs Abs 0.6 (*)   ? All other components within normal limits  ?COMPREHENSIVE METABOLIC PANEL - Abnormal; Notable for the following components:  ? CO2 18 (*)   ? Glucose, Bld 234 (*)   ? BUN 135 (*)   ? Creatinine, Ser 12.15 (*)   ? Total Protein 5.9 (*)   ? Albumin 3.3 (*)   ? GFR, Estimated 3 (*)   ? Anion gap 19 (*)   ? All other components within normal limits  ?LIPASE, BLOOD  ?URINALYSIS, ROUTINE W REFLEX MICROSCOPIC  ? ? ?EKG ?EKG Interpretation ? ?Date/Time:  Tuesday April 15 2021 22:54:44 EDT ?Ventricular Rate:  68 ?PR Interval:  164 ?QRS Duration: 104 ?QT Interval:  463 ?QTC Calculation: 493 ?R Axis:   122 ?Text Interpretation: Sinus rhythm Right axis deviation Probable anteroseptal infarct, old No significant change since last tracing Confirmed by Isla Pence [phone]) on 04/15/2021 11:19:53  PM ? ?Radiology ?No results found. ? ?Procedures ?Procedures  ? ? ?Medications Ordered in ED ?Medications - No data to display ? ?ED Course/ Medical Decision Making/ A&P ?  ?                        ?Medical Decision Making ?Risk ?Decision regarding  hospitalization. ? ? ?This patient presents to the ED for concern of weakness and swelling, this involves an extensive number of treatment options, and is a complaint that carries with it a high risk of complications and morbidity.  The differential diagnosis includes worsening kidney injury, chf,  ? ? ?Co morbidities that complicate the patient evaluation ? ?CKD, bulemia, anxiety, htn, gerd, hyperlipidemia, ibs and RLS ? ? ?Additional history obtained: ? ?Additional history obtained from epic chart review ?External records from outside source obtained and reviewed including son ? ? ?Lab Tests: ? ?I Ordered, and personally interpreted labs.  The pertinent results include:  Cr up to 12 (it was 8.24 on the 14th, 6.17 on 2/6 and 4.65 on 1/3).  She has chronic anemia with hgb 8.5.   ? ? ?Imaging Studies ordered: ? ?I ordered imaging studies including cxr which is pending ? ?Cardiac Monitoring: ? ?The patient was maintained on a cardiac monitor.  I personally viewed and interpreted the cardiac monitored which showed an underlying rhythm of: nsr ? ? ? ?Critical Interventions: ? ?Foley placed due to urinary retention ? ? ?Consultations Obtained: ? ?I requested consultation with the nephrologist (Dr. Joelyn Oms),  and discussed lab and imaging findings as well as pertinent plan - they recommend: admission and starting dialysis tomorrow ?Pt d/w Dr. Posey Pronto (triad) for admission ? ? ?Problem List / ED Course: ? ?Worsening ESRD.  Pt has >700 cc urine in bladder on bladder scan, so foley ordered.  PT will get admitted to start dialysis. ? ? ?Reevaluation: ? ?After the interventions noted above, I reevaluated the patient and found that they have :improved ? ? ?Social Determinants of  Health: ? ?Lives at home ? ? ?Dispostion: ? ?After consideration of the diagnostic results and the patients response to treatment, I feel that the patent would benefit from admission.   ? ? ? ? ? ? ? ?Final Clinical Impressi

## 2021-04-16 ENCOUNTER — Inpatient Hospital Stay (HOSPITAL_COMMUNITY): Payer: Commercial Managed Care - PPO

## 2021-04-16 ENCOUNTER — Encounter (HOSPITAL_COMMUNITY): Payer: Self-pay | Admitting: Internal Medicine

## 2021-04-16 DIAGNOSIS — E10621 Type 1 diabetes mellitus with foot ulcer: Secondary | ICD-10-CM

## 2021-04-16 DIAGNOSIS — N179 Acute kidney failure, unspecified: Secondary | ICD-10-CM | POA: Diagnosis not present

## 2021-04-16 DIAGNOSIS — N184 Chronic kidney disease, stage 4 (severe): Secondary | ICD-10-CM | POA: Diagnosis not present

## 2021-04-16 LAB — BASIC METABOLIC PANEL
Anion gap: 16 — ABNORMAL HIGH (ref 5–15)
BUN: 136 mg/dL — ABNORMAL HIGH (ref 6–20)
CO2: 18 mmol/L — ABNORMAL LOW (ref 22–32)
Calcium: 9 mg/dL (ref 8.9–10.3)
Chloride: 102 mmol/L (ref 98–111)
Creatinine, Ser: 12.35 mg/dL — ABNORMAL HIGH (ref 0.44–1.00)
GFR, Estimated: 3 mL/min — ABNORMAL LOW (ref >60)
Glucose, Bld: 88 mg/dL (ref 70–99)
Potassium: 4.6 mmol/L (ref 3.5–5.1)
Sodium: 136 mmol/L (ref 135–145)

## 2021-04-16 LAB — CBG MONITORING, ED: Glucose-Capillary: 134 mg/dL — ABNORMAL HIGH (ref 70–99)

## 2021-04-16 LAB — FERRITIN: Ferritin: 303 ng/mL (ref 11–307)

## 2021-04-16 LAB — GLUCOSE, CAPILLARY
Glucose-Capillary: 108 mg/dL — ABNORMAL HIGH (ref 70–99)
Glucose-Capillary: 97 mg/dL (ref 70–99)
Glucose-Capillary: 94 mg/dL (ref 70–99)
Glucose-Capillary: 158 mg/dL — ABNORMAL HIGH (ref 70–99)

## 2021-04-16 LAB — CBC
HCT: 26.8 % — ABNORMAL LOW (ref 36.0–46.0)
Hemoglobin: 8.2 g/dL — ABNORMAL LOW (ref 12.0–15.0)
MCH: 27.6 pg (ref 26.0–34.0)
MCHC: 30.6 g/dL (ref 30.0–36.0)
MCV: 90.2 fL (ref 80.0–100.0)
Platelets: 150 10*3/uL (ref 150–400)
RBC: 2.97 MIL/uL — ABNORMAL LOW (ref 3.87–5.11)
RDW: 14.9 % (ref 11.5–15.5)
WBC: 4.9 10*3/uL (ref 4.0–10.5)
nRBC: 0 % (ref 0.0–0.2)

## 2021-04-16 LAB — HEMOGLOBIN A1C
Hgb A1c MFr Bld: 6.3 % — ABNORMAL HIGH (ref 4.8–5.6)
Mean Plasma Glucose: 134.11 mg/dL

## 2021-04-16 LAB — HIV ANTIBODY (ROUTINE TESTING W REFLEX): HIV Screen 4th Generation wRfx: NONREACTIVE

## 2021-04-16 LAB — MRSA NEXT GEN BY PCR, NASAL: MRSA by PCR Next Gen: NOT DETECTED

## 2021-04-16 LAB — IRON AND TIBC
Iron: 32 ug/dL (ref 28–170)
Saturation Ratios: 17 % (ref 10.4–31.8)
TIBC: 189 ug/dL — ABNORMAL LOW (ref 250–450)
UIBC: 157 ug/dL

## 2021-04-16 MED ORDER — SENNOSIDES-DOCUSATE SODIUM 8.6-50 MG PO TABS: 1.0000 | ORAL_TABLET | Freq: Every evening | ORAL | Status: DC | PRN

## 2021-04-16 MED ORDER — ACETAMINOPHEN 325 MG PO TABS
650.0000 mg | ORAL_TABLET | Freq: Four times a day (QID) | ORAL | Status: DC | PRN
  Administered 2021-04-18: 650 mg via ORAL
  Filled 2021-04-16 (×2): qty 2

## 2021-04-16 MED ORDER — SODIUM BICARBONATE 650 MG PO TABS
1300.0000 mg | ORAL_TABLET | Freq: Two times a day (BID) | ORAL | Status: DC
  Administered 2021-04-16 (×2): 1300 mg via ORAL
  Filled 2021-04-16 (×2): qty 2

## 2021-04-16 MED ORDER — LABETALOL HCL 200 MG PO TABS
200.0000 mg | ORAL_TABLET | Freq: Two times a day (BID) | ORAL | Status: DC
Start: 2021-04-16 — End: 2021-04-18
  Administered 2021-04-16 – 2021-04-17 (×5): 200 mg via ORAL
  Filled 2021-04-16 (×5): qty 1

## 2021-04-16 MED ORDER — FLUOXETINE HCL 20 MG PO CAPS
40.0000 mg | ORAL_CAPSULE | Freq: Every morning | ORAL | Status: DC
  Administered 2021-04-16 – 2021-04-20 (×4): 40 mg via ORAL
  Filled 2021-04-16 (×4): qty 2

## 2021-04-16 MED ORDER — SEVELAMER CARBONATE 800 MG PO TABS
800.0000 mg | ORAL_TABLET | Freq: Three times a day (TID) | ORAL | Status: DC
Start: 2021-04-16 — End: 2021-04-20
  Administered 2021-04-16 – 2021-04-20 (×9): 800 mg via ORAL
  Filled 2021-04-16 (×9): qty 1

## 2021-04-16 MED ORDER — ROPINIROLE HCL 1 MG PO TABS
1.0000 mg | ORAL_TABLET | Freq: Every day | ORAL | Status: DC
  Administered 2021-04-16 – 2021-04-19 (×5): 1 mg via ORAL
  Filled 2021-04-16 (×6): qty 1

## 2021-04-16 MED ORDER — ONDANSETRON HCL 4 MG/2ML IJ SOLN: 4.0000 mg | Freq: Four times a day (QID) | INTRAMUSCULAR | Status: DC | PRN

## 2021-04-16 MED ORDER — ONDANSETRON HCL 4 MG PO TABS: 4.0000 mg | ORAL_TABLET | Freq: Four times a day (QID) | ORAL | Status: DC | PRN

## 2021-04-16 MED ORDER — HEPARIN SODIUM (PORCINE) 5000 UNIT/ML IJ SOLN
5000.0000 [IU] | Freq: Three times a day (TID) | INTRAMUSCULAR | Status: DC
  Administered 2021-04-16 – 2021-04-18 (×6): 5000 [IU] via SUBCUTANEOUS
  Filled 2021-04-16 (×6): qty 1

## 2021-04-16 MED ORDER — ZOLPIDEM TARTRATE 5 MG PO TABS
5.0000 mg | ORAL_TABLET | Freq: Every day | ORAL | Status: DC
  Administered 2021-04-16 – 2021-04-19 (×5): 5 mg via ORAL
  Filled 2021-04-16 (×5): qty 1

## 2021-04-16 MED ORDER — CHLORHEXIDINE GLUCONATE CLOTH 2 % EX PADS
6.0000 | MEDICATED_PAD | Freq: Every day | CUTANEOUS | Status: DC
  Administered 2021-04-17: 6 via TOPICAL

## 2021-04-16 MED ORDER — INSULIN ASPART 100 UNIT/ML IJ SOLN
0.0000 [IU] | Freq: Three times a day (TID) | INTRAMUSCULAR | Status: DC
  Administered 2021-04-17: 1 [IU] via SUBCUTANEOUS
  Administered 2021-04-19 (×2): 2 [IU] via SUBCUTANEOUS
  Administered 2021-04-20: 1 [IU] via SUBCUTANEOUS

## 2021-04-16 MED ORDER — MEDIHONEY WOUND/BURN DRESSING EX PSTE
1.0000 "application " | PASTE | Freq: Every day | CUTANEOUS | Status: DC
  Administered 2021-04-18 – 2021-04-20 (×3): 1 via TOPICAL
  Filled 2021-04-16: qty 44

## 2021-04-16 MED ORDER — MUPIROCIN 2 % EX OINT
1.0000 "application " | TOPICAL_OINTMENT | Freq: Two times a day (BID) | CUTANEOUS | Status: DC
  Administered 2021-04-16 – 2021-04-20 (×6): 1 via NASAL
  Filled 2021-04-16 (×3): qty 22

## 2021-04-16 MED ORDER — INSULIN ASPART 100 UNIT/ML IJ SOLN
0.0000 [IU] | Freq: Every day | INTRAMUSCULAR | Status: DC
  Administered 2021-04-18: 2 [IU] via SUBCUTANEOUS

## 2021-04-16 MED ORDER — CHLORHEXIDINE GLUCONATE CLOTH 2 % EX PADS
6.0000 | MEDICATED_PAD | Freq: Every day | CUTANEOUS | Status: DC
  Administered 2021-04-17 – 2021-04-20 (×4): 6 via TOPICAL

## 2021-04-16 MED ORDER — ACETAMINOPHEN 650 MG RE SUPP
650.0000 mg | Freq: Four times a day (QID) | RECTAL | Status: DC | PRN
Start: 2021-04-16 — End: 2021-04-20

## 2021-04-16 MED ORDER — FELODIPINE ER 5 MG PO TB24
5.0000 mg | ORAL_TABLET | Freq: Every day | ORAL | Status: DC
  Administered 2021-04-16 – 2021-04-17 (×3): 5 mg via ORAL
  Filled 2021-04-16 (×4): qty 1

## 2021-04-16 NOTE — Assessment & Plan Note (Signed)
Resume home labetalol and felodipine. ?

## 2021-04-16 NOTE — Consult Note (Addendum)
Nephrology Consult   Assessment/Recommendations:  CKD5, now ESRD -will initiate HD while she's here (volume status has been refractory to escalating doses of diuretics, was supposed to start HD as an outpatient). Had a lengthy discussion about this and she is agreeable. Has a functional AVF -will start HD tomorrow (high inpatient census/staffing issues today). Slow start protocol. Start CLIP for outpatient placement -Continue to monitor daily Cr, Dose meds for GFR<15 -Maintain MAP>65 for optimal renal perfusion.  -Avoid nephrotoxic medications including NSAIDs and iodinated intravenous contrast exposure unless the latter is absolutely indicated.  Preferred narcotic agents for pain control are hydromorphone, fentanyl, and methadone. Morphine should not be used. Avoid Baclofen and avoid oral sodium phosphate and magnesium citrate based laxatives / bowel preps. Continue strict Input and Output monitoring. Will monitor the patient closely with you and intervene or adjust therapy as indicated by changes in clinical status/labs   Hypertension/volume: -resume home meds, UF as tolerated  Metabolic acidosis -on nahco3. Can stop this once steady on HD  Secondary hyperparathyroidism, CKD-MBD -c/w renvela. Low phos diet. Will check PTH  Anemia due to CKD: -Transfuse for Hgb<7 g/dL -iron studies to determine Fe vs ESA needs  Diabetes Mellitus Type 2 with Hyperglycemia: per primary service  Dialysis access: Right brachiocephalic AV fistula placed in 1308-[id number] to be functional at this time.  If any issues with access pressures on HD, then we will consult IR for fistulogram   [facility] 04/16/2021 11:10 AM   _____________________________________________________________________________________   History of Present Illness: Holly [name] is a/an 56 y.o. female with a past medical history of CKD 5 (follows with Dr. Marisue Humble), DM 2, hypertension, hyperlipidemia, chronic  anemia of CKD, history of status post (metatarsal amputation, RLS, depression/anxiety who presents to [facility] with worsening edema of her legs and abdomen.  She was last seen in the office on 3/17, had already escalated her Lasix dose at that time with no improvement.  Was in the process of being set up for dialysis at that time. She did have some urinary retention with greater than 700 cc on bladder scan, despite Foley placement her kidney function has not improved.  She otherwise denies any nausea/vomiting, dysgeusia, loss of appetite, intractable hiccups/pruritus, brain fog, new shakes/tremors.   Medications:  Current Facility-Administered Medications  Medication Dose Route Frequency Provider Last Rate Last Admin   acetaminophen (TYLENOL) tablet 650 mg  650 mg Oral Q6H PRN Charlsie Quest, MD       Or   acetaminophen (TYLENOL) suppository 650 mg  650 mg Rectal Q6H PRN Charlsie Quest, MD       Chlorhexidine Gluconate Cloth 2 % PADS 6 each  6 each Topical Q0600 Darreld Mclean R, MD       felodipine (PLENDIL) 24 hr tablet 5 mg  5 mg Oral QHS Darreld Mclean R, MD   5 mg at 04/16/21 0114   FLUoxetine (PROZAC) capsule 40 mg  40 mg Oral q morning Darreld Mclean R, MD   40 mg at 04/16/21 0837   heparin injection 5,000 Units  5,000 Units Subcutaneous Q8H Darreld Mclean R, MD   5,000 Units at 04/16/21 6962   insulin aspart (novoLOG) injection 0-5 Units  0-5 Units Subcutaneous QHS Patel, Vishal R, MD       insulin aspart (novoLOG) injection 0-9 Units  0-9 Units Subcutaneous TID WC Patel, Vishal R, MD       labetalol (NORMODYNE) tablet 200 mg  200 mg Oral BID Allena Katz,  Vishal R, MD   200 mg at 04/16/21 0838   mupirocin ointment (BACTROBAN) 2 % 1 application.  1 application. Nasal BID Charlsie Quest, MD       ondansetron [facility]) tablet 4 mg  4 mg Oral Q6H PRN Charlsie Quest, MD       Or   ondansetron [facility]) injection 4 mg  4 mg Intravenous Q6H PRN Charlsie Quest, MD       rOPINIRole (REQUIP) tablet 1 mg  1 mg  Oral QHS Darreld Mclean R, MD   1 mg at 04/16/21 0114   senna-docusate (Senokot-S) tablet 1 tablet  1 tablet Oral QHS PRN Charlsie Quest, MD       sevelamer carbonate (RENVELA) tablet 800 mg  800 mg Oral TID WC Darreld Mclean R, MD   800 mg at 04/16/21 1610   sodium bicarbonate tablet 1,300 mg  1,300 mg Oral BID Charlsie Quest, MD   1,300 mg at 04/16/21 9604   zolpidem (AMBIEN) tablet 5 mg  5 mg Oral QHS Charlsie Quest, MD   5 mg at 04/16/21 0114     ALLERGIES Bactrim, Byetta 10 mcg pen [exenatide], Hctz [hydrochlorothiazide], Spironolactone, Sulfa antibiotics, Sulfacetamide sodium, and Sulfamethoxazole-trimethoprim  MEDICAL HISTORY Past Medical History:  Diagnosis Date   Acute cystitis    Anxiety state, unspecified    Background diabetic retinopathy(362.01)    Bulimia    Calculus of kidney    Cellulitis and abscess of foot 01/24/2015   left foot   Chronic inflammatory demyelinating polyneuritis (HCC)    Chronic kidney disease, stage IV (severe) (HCC)    Complication of anesthesia    slow to wake up, had lost a lot of blood - 2010   Diarrhea    symptomatic   Disorders of magnesium metabolism    DM2 (diabetes mellitus, type 2) (HCC)    Dysthymic disorder    Dysuria    Edema    moderate   Encounter for long-term (current) use of other medications    Esophageal reflux    Essential hypertension, benign    Fever 10/2018   Heart murmur    been told very slight, never given her any problems   History of blood transfusion X 6-7; 2010 - present (03/29/2014)   "related to OR; kidney issues"   HLD (hyperlipidemia)    HDL goal >50, LDL goal <100   Iron deficiency anemia    "suppose to get procrit injections q 3 wks; usually don't" do it" (03/29/2014)   Irritable bowel syndrome    Left heart failure (HCC)    Muscle weakness (generalized)    Neuralgia, neuritis, and radiculitis, unspecified    Palpitations    Peripheral autonomic neuropathy in disorders classified elsewhere(337.1)     Pneumonia 2010   Primary pulmonary hypertension (HCC)    not seen at CATH  (35mm Hg), pt not aware of this   RLS (restless legs syndrome)    Type II or unspecified type diabetes mellitus with other specified manifestations, not stated as uncontrolled    Cardiac catheterization 7/10 at [facility] in [facility]: Normal coronary arteries   Unspecified essential hypertension    Renal Dopplers 12/23/09 at [place] [facility] in [facility]: No significant renal artery stenosis bilaterally     SOCIAL HISTORY Social History   Socioeconomic History   Marital status: Media planner    Spouse name: Not on file   Number of children: 4   Years of education: Not  on file   Highest education level: Not on file  Occupational History   Occupation: collections  Tobacco Use   Smoking status: Never   Smokeless tobacco: Never  Vaping Use   Vaping Use: Never used  Substance and Sexual Activity   Alcohol use: Yes    Comment: 03/29/2014 "might have a drink a couple times/yr"   Drug use: No   Sexual activity: Yes  Other Topics Concern   Not on file  Social History Narrative   Not on file   Social Determinants of Health   Financial Resource Strain: Not on file  Food Insecurity: Not on file  Transportation Needs: Not on file  Physical Activity: Not on file  Stress: Not on file  Social Connections: Not on file  Intimate Partner Violence: Not on file     FAMILY HISTORY Family History  Problem Relation Age of Onset   Hypertension Mother    Myelodysplastic syndrome Father    Pancreatic cancer Other      Review of Systems: 12 systems reviewed Otherwise as per HPI, all other systems reviewed and negative  Physical Exam: Vitals:   04/16/21 0154 04/16/21 0808  BP: (!) 163/77 (!) 117/56  Pulse: 68 72  Resp: 18 17  Temp: 97.6 F (36.4 C) 98 F (36.7 C)  SpO2: 100% 97%   Total I/O In: 240 [P.O.:240] Out: -   Intake/Output Summary (Last 24 hours) at 04/16/2021 1110 Last  data filed at 04/16/2021 0900 Gross per 24 hour  Intake 240 ml  Output 650 ml  Net -410 ml   General: well-appearing, no acute distress HEENT: anicteric sclera, oropharynx clear without lesions CV: regular rate, normal rhythm, no murmurs, no gallops, no rubs Lungs: clear to auscultation bilaterally, normal work of breathing Abd: soft, non-tender, non-distended Skin: no visible lesions or rashes Psych: alert, engaged, appropriate mood and affect Musculoskeletal: 2+ edema bilateral lower extremities Neuro: normal speech, no gross focal deficits, no asterixis Dialysis access: RUE AVF +b/t  Test Results Reviewed Lab Results  Component Value Date   NA 136 04/16/2021   K 4.6 04/16/2021   CL 102 04/16/2021   CO2 18 (L) 04/16/2021   BUN 136 (H) 04/16/2021   CREATININE 12.35 (H) 04/16/2021   GFR 40.73 (L) 10/30/2010   CALCIUM 9.0 04/16/2021   ALBUMIN 3.3 (L) 04/15/2021   PHOS 8.6 (H) 04/08/2021     I have reviewed all relevant outside healthcare records related to the patient's kidney injury.

## 2021-04-16 NOTE — Progress Notes (Signed)
NEW ADMISSION NOTE ?New Admission Note:  ?  ?Arrival Method: Stretcher ?Mental Orientation: A & O X4 ?Telemetry:# N/A ?Assessment: Completed ?Skin: Left 3 and 4th toe diabetic ulcer ?IV: None ?Pain: None ?Tubes: Indwelling Foley Catheter ?Safety Measures: Low Bed ?Admission: Completed ?[facility] [place] Orientation: Patient has been oriented to the room, unit and staff.  ?Family: Aware of patient's disposition ?  ?Orders have been reviewed and implemented. Will continue to monitor the patient. Call light has been placed within reach and bed alarm has been activated. ?Wound care consulted for left toes ulcer. MRSA PCR completed. ?

## 2021-04-16 NOTE — Assessment & Plan Note (Signed)
Continue home Prozac 

## 2021-04-16 NOTE — Progress Notes (Addendum)
?PROGRESS NOTE ? ? ? ?Holly [name]  VOJ:[id number] DOB: 1965/12/12 DOA: 04/15/2021 ? ?PCP: [facility] [place]  ? 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  Patient also reported decreased urine output .  She reports that her nephrology team is planning on starting outpatient dialysis soon.  Labs in the ED include serum creatinine 12.15(rising from 4> 6> 8> in last 4 months) bladder scan showed more than 700 mL.  Foley catheter was inserted for urinary retention. Nephrologist was consulted,  Patient is scheduled to have hemodialysis tomorrow. ? ?Assessment & Plan: ?  ?Principal Problem: ?  Acute renal failure superimposed on stage 4 chronic kidney disease ([place]) ?Active Problems: ?  Acute urinary retention ?  Type 2 diabetes mellitus with chronic kidney disease, with long-term current use of insulin ([place]) ?  Hypertension associated with diabetes ([place]) ?  Anemia in chronic kidney disease ?  Depression ? ?AKI on CKD stage IV: ?Patient with progressively worsening renal function over the last 4 months with labs showing BUN 135, Creatinine 12.5, GFR 3 on admission. ?Patient has AV fistula placed in right upper extremity with plans for initiating outpatient dialysis soon. ?Nephrology is consulted by ED, plan to initiate hemodialysis tomorrow. ? ?Acute urinary retention: ?Bladder scan more than 700 mm on arrival. ?Likely contributing some degree of progressive renal dysfunction. ?Continue Foley catheter. ? ?Type 2 diabetes with CKD ?Hold p.o. diabetic medications.  ?Continue regular insulin sliding scale.  ? ?Essential hypertension: ?Continue labetalol and felodipine ? ?Anemia of chronic disease: ?Hemoglobin relatively  stable. ? ?Depression: ?Continue Prozac ? ?Right foot chronic wound: ?Podiatry consulted.  X-ray obtained.  ?Awaiting recommendation. ?  ? ? ?DVT prophylaxis:  Heparin sq ?Code Status:Full code, ?Family Communication: No family at bed side. ?Disposition Plan:  ? ?Status is: Inpatient ?Remains inpatient appropriate because: Admitted for AKI on CKD stage IV requiring hemodialysis. Scheduled for tomorrow. ? ?Consultants:  ?Nephrology ? ?Procedures: ?Initiation of  Hemodialysis 3/23 ?Antimicrobials: None ? ? ?Subjective: ?Patient was seen and examined at bedside.  Overnight events noted. ?Patient reports feeling better,  states she was not able to urinate,  now has Foley catheter placed.  Renal functions not improving,  She is initiating hemodialysis tomorrow. ? ?Objective: ?Vitals:  ? 04/16/21 0114 04/16/21 0116 04/16/21 0154 04/16/21 9937  ?BP: (!) 160/76 (!) 160/76 (!) 163/77 (!) 117/56  ?Pulse: 71 71 68 72  ?Resp:  15 18 17   ?Temp:   97.6 ?F (36.4 ?C) 98 ?F (36.7 ?C)  ?TempSrc:   Oral   ?SpO2:  100% 100% 97%  ?Weight:   73.3 kg   ?Height:   5\' 6"  (1.676 m)   ? ? ?Intake/Output Summary (Last 24 hours) at 04/16/2021 1217 ?Last data filed at 04/16/2021 0900 ?Gross per 24 hour  ?Intake 240 ml  ?Output 650 ml  ?Net -410 ml  ? ?Filed Weights  ? 04/15/21 1547 04/16/21 0154  ?Weight: 73.3 kg 73.3 kg  ? ? ?Examination: ? ?General exam: Appears comfortable, not in any acute distress. ?Respiratory system: CTA bilaterally, no wheezing, no crackles. Respiratory effort normal. ?Cardiovascular system: S1 & S2 heard, regular rate and rhythm, no murmur.  Gastrointestinal system: Abdomen is soft, non tender, non distended, BS+ ?Central nervous system: Alert and oriented  x 3. No focal neurological deficits. ?Extremities: Edema+, chronic erythematous non healing wound on the right foot. ?Skin: No rashes, lesions or ulcers ?Psychiatry: Judgement and insight appear normal. Mood & affect appropriate.  ? ? ? ?Data Reviewed: I have personally  reviewed following labs and imaging studies ? ?CBC: ?Recent Labs  ?Lab 04/15/21 ?1607 04/16/21 ?0615  ?WBC 6.5 4.9  ?NEUTROABS 5.0  --   ?HGB 8.5* 8.2*  ?HCT 28.3* 26.8*  ?MCV 91.9 90.2  ?PLT 163 150  ? ?Basic Metabolic Panel: ?Recent Labs  ?Lab 04/15/21 ?1607 04/16/21 ?0615  ?NA 139 136  ?K 4.8 4.6  ?CL 102 102  ?CO2 18* 18*  ?GLUCOSE 234* 88  ?BUN 135* 136*  ?CREATININE 12.15* 12.35*  ?CALCIUM 9.0 9.0  ? ?GFR: ?Estimated Creatinine Clearance: 5.1 mL/min (A) (by C-G formula based on SCr of 12.35 mg/dL (H)). ?Liver Function Tests: ?Recent Labs  ?Lab 04/15/21 ?1607  ?AST 15  ?ALT 17  ?ALKPHOS 92  ?BILITOT 0.6  ?PROT 5.9*  ?ALBUMIN 3.3*  ? ?Recent Labs  ?Lab 04/15/21 ?1607  ?LIPASE 27  ? ?No results for input(s): AMMONIA in the last 168 hours. ?Coagulation Profile: ?No results for input(s): INR, PROTIME in the last 168 hours. ?Cardiac Enzymes: ?No results for input(s): CKTOTAL, CKMB, CKMBINDEX, TROPONINI in the last 168 hours. ?BNP (last 3 results) ?No results for input(s): PROBNP in the last 8760 hours. ?HbA1C: ?Recent Labs  ?  04/16/21 ?0615  ?HGBA1C 6.3*  ? ?CBG: ?Recent Labs  ?Lab 04/16/21 ?0101 04/16/21 ?0748 04/16/21 ?1140  ?GLUCAP 134* 108* 97  ? ?Lipid Profile: ?No results for input(s): CHOL, HDL, LDLCALC, TRIG, CHOLHDL, LDLDIRECT in the last 72 hours. ?Thyroid Function Tests: ?No results for input(s): TSH, T4TOTAL, FREET4, T3FREE, THYROIDAB in the last 72 hours. ?Anemia Panel: ?No results for input(s): VITAMINB12, FOLATE, FERRITIN, TIBC, IRON, RETICCTPCT in the last 72 hours. ?Sepsis Labs: ?No results for input(s): PROCALCITON, LATICACIDVEN in the last 168 hours. ? ?Recent Results (from the past 240 hour(s))  ?MRSA Next Gen by PCR, Nasal     Status: None  ? Collection Time: 04/16/21  2:48 AM  ? Specimen: Nasal Mucosa; Nasal Swab  ?Result Value Ref Range Status  ? MRSA by PCR Next Gen NOT DETECTED NOT DETECTED Final  ?  Comment: (NOTE) ?The GeneXpert MRSA Assay (FDA approved for NASAL specimens only), ?is one  component of a comprehensive MRSA colonization surveillance ?program. It is not intended to diagnose MRSA infection nor to guide ?or monitor treatment for MRSA infections. ?Test performance is not FDA approved in patients less than 2 years ?old. ?Performed at [facility], [place] [address] [place], [place] ?[address] ?  ?  ?Radiology Studies: ?DG Chest Portable 1 View ? ?Result Date: 04/15/2021 ?CLINICAL DATA:  End-stage renal disease. EXAM: PORTABLE CHEST 1 VIEW COMPARISON:  Chest x-ray 12/04/2020. FINDINGS: Cardiac silhouette is enlarged. There are some linear opacities in the left mid lung. There is no pleural effusion or pneumothorax. There is no central pulmonary vascular congestion. No acute fractures are seen. IMPRESSION: 1. Stable cardiomegaly. 2. Linear opacities in the left mid lung favored is atelectasis. Electronically Signed   By: Ronney Asters M.D.   On: 04/15/2021 23:56   ? ?Scheduled Meds: ? Chlorhexidine Gluconate Cloth  6 each Topical Q0600  ? [START ON 04/17/2021] Chlorhexidine Gluconate Cloth  6 each Topical Q0600  ? felodipine  5 mg Oral QHS  ? FLUoxetine  40 mg Oral q morning  ? heparin  5,000 Units Subcutaneous Q8H  ? insulin aspart  0-5 Units Subcutaneous QHS  ? insulin aspart  0-9 Units Subcutaneous TID WC  ? labetalol  200 mg Oral BID  ? mupirocin ointment  1 application. Nasal BID  ? rOPINIRole  1 mg Oral QHS  ? sevelamer carbonate  800 mg Oral TID WC  ? sodium bicarbonate  1,300 mg Oral BID  ? zolpidem  5 mg Oral QHS  ? ?Continuous Infusions: ? ? LOS: 1 day  ? ? ?Time spent: 50 mins ? ? ? ?Shawna Clamp, MD ?[facility] ? ? ?If 7PM-7AM, please contact night-coverage  ?

## 2021-04-16 NOTE — Progress Notes (Signed)
Prior to pt's admission to the hospital, pt had been referred to [place] program at [facility]. Pt's schedule should be Monday, Tuesday, Thursday, Friday at 5:00 am chair time. Awaiting final clearance/approval from [facility]. Will assist as needed.  ? ?Melven Sartorius ?Renal Navigator ?[phone] ?

## 2021-04-16 NOTE — Consult Note (Addendum)
[facility] Nurse Consult Note: ?Reason for Consult: Right foot with chronic, non healing full thickness wound to 1st metatarsal head, two new full thickness wounds to anterior aspect of 2nd and 3rd digits. Patient follows in [place] ([facility] outpatient wound care center with Podiatric Medicine physician, Dr. Eston Esters. Last seen on 03/07/21 in that office. Please see note from that encounter. ?Wound type: Neuropathic, infectious ?Pressure Injury POA: N/A ?Measurement: ?Right 1st met head: 1cm round x 0.2cm with ruddy dark center, no exudate. ?Right foot, 2nd digit: 1.5cm round x 0.1cm with dry, red wound bed ?Right foot, 3rd digit: 1cm round x 0.1cm with dry, red, wound bed ?Wound bed:As noted above ?Drainage (amount, consistency, odor) None ?Periwound: Right foot, 2nd and 3rd digits with erythema. Bilateral LEs with edema. ?Dressing procedure/placement/frequency: ?I will provide topical wound care guidance for the daily care of these lesions using a soap and water cleanse, rinse and pat dry and dressing with folded layers of xeroform (nonadherent antimicrobial) topped with dry gauze and secured with Kerlix roll gauze/paper tape. ? ?If desired, recommend consultation with Orthopedics or Podiatric Medicine while in house. ? ?[facility] nursing team will not follow, but will remain available to this patient, the nursing and medical teams.  Please re-consult if needed. ?Thanks, ?Maudie Flakes, MSN, RN, [place], [place], CWON-AP, [place]  ?Pager# [phone]  ? ? ? ?  ?

## 2021-04-16 NOTE — Consult Note (Signed)
Podiatry Consult Note ? ?To: Dr. Shawna Clamp, hospitalist ?Reason for consult: Right foot wounds ?From: Dr. Cannon Kettle, podiatry ? ?HPI: ?Holly [name] is a 56 y.o. female patient who seen at bedside for evaluation of right foot ulcerations.  Patient reports that she has always had the one on the bottom of the foot for several weeks but on yesterday noticed new wounds to her second and third toes.  Patient reports that she came to the hospital for other concerns but states that while she was here she did asked the hospital doctor to also have her foot checked.  Patient has a significant history of gangrene and wound complications that required her to get a transmetatarsal amputation on the left foot back in 2016.  Patient has also a significant history of renal failure and is scheduled to get dialysis.  Patient denies any current pain in the right foot nausea vomiting fever chills or any constitutional symptoms at this time. ? ?Patient is assisted by son's bedside. ? ?Patient Active Problem List  ? Diagnosis Date Noted  ? Acute urinary retention 04/15/2021  ? Deficiency anemia 11/15/2018  ? Leukopenia 11/08/2018  ? History of transmetatarsal amputation of left foot ([place])   ? Fever 11/06/2018  ? UTI (urinary tract infection) 01/30/2015  ? Acute renal failure superimposed on stage 4 chronic kidney disease ([place]) 01/28/2015  ? Foot abscess, left 01/27/2015  ? Type 2 diabetes mellitus with chronic kidney disease, with long-term current use of insulin ([place])   ? Cellulitis of left foot 01/24/2015  ? Thrombocytopenia ([place]) 01/24/2015  ? Dehydration, moderate 01/24/2015  ? CKD (chronic kidney disease), stage V ([place]) 01/24/2015  ? Type 2 diabetes mellitus with left diabetic foot ulcer ([place]) 12/24/2014  ? Type 2 diabetes mellitus with right diabetic foot ulcer ([place]) 12/24/2014  ? Type II diabetes mellitus with neurological manifestations ([place]) 12/24/2014  ? Acute renal failure superimposed on stage 3 chronic kidney disease  ([place]) 03/29/2014  ? Neurogenic bladder/suspected 03/29/2014  ? 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 Hypertension associated with diabetes ([place]) 03/29/2014  ? Menorrhagia 07/19/2011  ? Tricuspid regurgitation 10/30/2010  ? Anemia in chronic kidney disease 10/30/2010  ? Dysautonomia ([place]) 08/20/2010  ? Chronic diastolic heart failure ([place]) 07/10/2010  ? Diabetes mellitus type 2, uncontrolled 04/04/2007  ? Depression 09/08/2006  ? Peripheral neuropathy ([place]) 09/08/2006  ? CHOLELITHIASIS 09/08/2006  ? ? ?No current facility-administered medications on file prior to encounter.  ? ?Current Outpatient Medications on File Prior to Encounter  ?Medication Sig Dispense Refill  ? felodipine (PLENDIL) 5 MG 24 hr tablet Take 5 mg by mouth at bedtime.  6  ? FLUoxetine (PROZAC) 40 MG capsule Take 40 mg by mouth every morning.     ? furosemide (LASIX) 40 MG tablet Take 80-120 mg by mouth See admin instructions. 120 mg bid ?May take 80 mg bid prn swelling    ? insulin glargine, 1 Unit Dial, (TOUJEO SOLOSTAR) 300 UNIT/ML Solostar Pen Inject 10 Units into the skin daily.    ? labetalol (NORMODYNE) 200 MG tablet Take 200 mg by mouth 2 (two) times daily.    ? loperamide (IMODIUM A-D) 2 MG tablet Take 2-4 mg by mouth 4 (four) times daily as needed for diarrhea or loose stools.    ? MELATONIN GUMMIES PO Take 2 tablets by mouth at bedtime. Unsure of dose    ? metolazone (ZAROXOLYN) 5 MG tablet Take 5 mg by mouth daily.    ? rOPINIRole (REQUIP) 1 MG tablet  TAKE 1 TABLET EVERY DAY AS NEEDED (Patient taking differently: Take 1 mg by mouth at bedtime.) 30 tablet 2  ? Semaglutide, 1 MG/DOSE, (OZEMPIC, 1 MG/DOSE,) 4 MG/3ML SOPN Inject 1 mg into the skin every Wednesday.    ? sevelamer carbonate (RENVELA) 800 MG tablet Take 800 mg by mouth 3 (three) times daily with meals.    ? silver sulfADIAZINE (SILVADENE) 1 % cream Apply 1 application. topically daily. Right foot    ? sodium bicarbonate 650 MG tablet Take 1,300 mg by mouth 2 (two) times daily.     ? zolpidem (AMBIEN) 5 MG tablet Take 5 mg by mouth at bedtime.    ? glucose blood (ONETOUCH VERIO) test strip 1 each by Other route 2 (two) times daily. And lancets 2/day 250/.61 100 each 12  ? [DISCONTINUED] rOPINIRole (REQUIP) 1 MG tablet Take 1 tablet (1 mg total) by mouth daily as needed. 21 tablet 0  ? ? ?Allergies  ?Allergen Reactions  ? Bactrim Nausea And Vomiting  ? Byetta 10 Mcg Pen [Exenatide] Nausea And Vomiting  ? Hctz [Hydrochlorothiazide] Other (See Comments) and Itching  ?  dizziness ?dizziness  ? Spironolactone Other (See Comments)  ?  Dizziness   ? Sulfa Antibiotics Nausea And Vomiting  ? Sulfacetamide Sodium Nausea And Vomiting  ? Sulfamethoxazole-Trimethoprim Nausea And Vomiting  ? ? ?Past Surgical History:  ?Procedure Laterality Date  ? AMPUTATION Left 01/26/2015  ? Procedure: POST TRANS MET ;  Surgeon: Newt Minion, MD;  Location: [place];  Service: Orthopedics;  Laterality: Left;  ? APPENDECTOMY    ? AV FISTULA PLACEMENT Right 01/08/2015  ? Procedure: ARTERIOVENOUS (AV) FISTULA CREATION;  Surgeon: Angelia Mould, MD;  Location: [place];  Service: Vascular;  Laterality: Right;  ? CESAREAN SECTION  2001; 1999; 1994  ? DILATION AND CURETTAGE OF UTERUS    ? EYE SURGERY    ? INCISION AND DRAINAGE OF WOUND Right 2010  ? "serious staph infection"  ? PARS PLANA VITRECTOMY Bilateral 2010  ? "related to DM"  ? TONSILLECTOMY AND ADENOIDECTOMY    ? ? ?Family History  ?Problem Relation Age of Onset  ? Hypertension Mother   ? Myelodysplastic syndrome Father   ? Pancreatic cancer Other   ? ? ?Social History  ? ?Socioeconomic History  ? Marital status: Soil scientist  ?  Spouse name: Not on file  ? Number of children: 4  ? Years of education: Not on file  ? Highest education level: Not on file  ?Occupational History  ? Occupation: collections  ?Tobacco Use  ? Smoking status: Never  ? Smokeless tobacco: Never  ?Vaping Use  ? Vaping Use: Never used  ?Substance and Sexual Activity  ? Alcohol use: Yes  ?   Comment: 03/29/2014 "might have a drink a couple times/yr"  ? Drug use: No  ? Sexual activity: Yes  ?Other Topics Concern  ? Not on file  ?Social History Narrative  ? Not on file  ? ?Social Determinants of Health  ? ?Financial Resource Strain: Not on file  ?Food Insecurity: Not on file  ?Transportation Needs: Not on file  ?Physical Activity: Not on file  ?Stress: Not on file  ?Social Connections: Not on file  ?Intimate Partner Violence: Not on file  ? ? ? ?Objective:  ?Today's Vitals  ? 04/16/21 0154 04/16/21 0808 04/16/21 0842 04/16/21 1625  ?BP: (!) 163/77 (!) 117/56  (!) 143/71  ?Pulse: 68 72  78  ?Resp: '18 17  18  ' ?  Temp: 97.6 ?F (36.4 ?C) 98 ?F (36.7 ?C)  98 ?F (36.7 ?C)  ?TempSrc: Oral     ?SpO2: 100% 97%  93%  ?Weight: 73.3 kg     ?Height: '5\' 6"'  (1.676 m)     ?PainSc: 0-No pain  0-No pain   ? ?Body mass index is 26.08 kg/m?.  ? ?General: Alert and oriented x3 in no acute distress ? ?Dermatology: There is a plantar ulcer that has a dry superficial eschar noted to the first metatarsal head on the right foot with no significant erythema or edema no active drainage no warmth.  There is also ulcerations present at the distal tuft of the right second and third toes that both measure approximately less than 0.5 cm with a eschar base and very minimal edema, no erythema no warmth no active drainage no malodor, no other signs of acute infection at this time. ? ?Vascular: Dorsalis Pedis artery 1 out of 4 on the right, posterior tibial artery 0 out of 4 on the right. ? ?Neurology: Gross sensation present via light touch however protective severely diminished bilateral.  ? ?Musculoskeletal: Status post left transmetatarsal amputation ?Right foot mild hammertoe deformities and prominent first metatarsal head that corresponds with ulceration as above. ? ? ?Results ?Xrays  ?Right foot ?IMPRESSION: ?1. No definite radiographic findings of osteomyelitis. Poorly ?visualized third digit phalangeal tuft due to overlying  densities. ?2.  No acute displaced fracture or dislocation. ? ? ? ?Results for orders placed or performed during the hospital encounter of 04/15/21  ?MRSA Next Gen by PCR, Nasal     Status: None  ? Collection Time

## 2021-04-16 NOTE — Assessment & Plan Note (Signed)
Hold home meds and place on SSI. 

## 2021-04-16 NOTE — Assessment & Plan Note (Signed)
Hemoglobin relatively stable.  Continue to monitor. ?

## 2021-04-16 NOTE — Hospital Course (Signed)
Holly [name] is a 56 y.o. female with medical history significant for CKD stage IV, insulin-dependent T2DM, HTN, HLD, anemia of chronic kidney disease, left foot abscess s/p transmetatarsal amputation, RLS, and depression/anxiety who is admitted with AKI on CKD stage IV.  Nephrology recommended admission to initiate dialysis. ?

## 2021-04-16 NOTE — Assessment & Plan Note (Signed)
Bladder scan >700 mL on arrival.  Likely contributing some degree of progressive renal dysfunction. ?-Foley catheter placed in ED ?

## 2021-04-16 NOTE — Assessment & Plan Note (Signed)
Patient with progressively worsening renal function over the last 4 months with labs showing BUN 135, creatinine 12.15, GFR 3 on admission.  She has AV fistula placed in RUE with plans for starting outpatient dialysis soon.  Nephrology consulted by ED provider and recommended admission to initiate dialysis. ?-Nephrology to see for potential dialysis initiation ?

## 2021-04-16 NOTE — Progress Notes (Addendum)
?  Transition of Care (TOC) Screening Note ? ? ?Patient Details  ?Name: Denisse Whitenack Holberg ?Date of Birth: [birth year] ? ? ?Transition of Care (TOC) CM/SW Contact:    ?Tom-Johnson, Renea Ee, RN ?Phone Number: ?04/16/2021, 2:28 PM ? ?Patient is admitted for ARF on CKD stage 4. From home with husband and two out of her five children. Currently employed at [employer]. Independent with care and drives self prior to admission. Does not have DME's at [facility].  ?PC[facility] is [facility] Loron at [facility] but does most of her medical care at [facility]. States she seldomly goes to [facility] [address].  ?Patient has been clipped at [facility] with MTTF schedule. Patient states family will transport.  ?Transition of Care Department [facility]) has reviewed patient and no TOC needs or recommendations have been identified at this time. TOC will continue to monitor patient advancement through interdisciplinary progression rounds. If new patient transition needs arise, please place a TOC consult. ?  ?

## 2021-04-17 ENCOUNTER — Encounter (HOSPITAL_COMMUNITY): Payer: Commercial Managed Care - PPO

## 2021-04-17 LAB — CBC
HCT: 26.2 % — ABNORMAL LOW (ref 36.0–46.0)
HCT: 25.6 % — ABNORMAL LOW (ref 36.0–46.0)
Hemoglobin: 8.1 g/dL — ABNORMAL LOW (ref 12.0–15.0)
Hemoglobin: 7.9 g/dL — ABNORMAL LOW (ref 12.0–15.0)
MCH: 27.9 pg (ref 26.0–34.0)
MCH: 27.7 pg (ref 26.0–34.0)
MCHC: 30.9 g/dL (ref 30.0–36.0)
MCHC: 30.9 g/dL (ref 30.0–36.0)
MCV: 90.3 fL (ref 80.0–100.0)
MCV: 89.8 fL (ref 80.0–100.0)
Platelets: 135 10*3/uL — ABNORMAL LOW (ref 150–400)
Platelets: 144 10*3/uL — ABNORMAL LOW (ref 150–400)
RBC: 2.9 MIL/uL — ABNORMAL LOW (ref 3.87–5.11)
RBC: 2.85 MIL/uL — ABNORMAL LOW (ref 3.87–5.11)
RDW: 15.2 % (ref 11.5–15.5)
RDW: 15.1 % (ref 11.5–15.5)
WBC: 6 10*3/uL (ref 4.0–10.5)
WBC: 5.7 10*3/uL (ref 4.0–10.5)
nRBC: 0 % (ref 0.0–0.2)
nRBC: 0 % (ref 0.0–0.2)

## 2021-04-17 LAB — BASIC METABOLIC PANEL
Anion gap: 15 (ref 5–15)
BUN: 137 mg/dL — ABNORMAL HIGH (ref 6–20)
CO2: 16 mmol/L — ABNORMAL LOW (ref 22–32)
Calcium: 8.6 mg/dL — ABNORMAL LOW (ref 8.9–10.3)
Chloride: 106 mmol/L (ref 98–111)
Creatinine, Ser: 12.97 mg/dL — ABNORMAL HIGH (ref 0.44–1.00)
GFR, Estimated: 3 mL/min — ABNORMAL LOW (ref >60)
Glucose, Bld: 133 mg/dL — ABNORMAL HIGH (ref 70–99)
Potassium: 5 mmol/L (ref 3.5–5.1)
Sodium: 137 mmol/L (ref 135–145)

## 2021-04-17 LAB — HEPATITIS B SURFACE ANTIGEN
Hepatitis B Surface Ag: NONREACTIVE
Hepatitis B Surface Ag: NONREACTIVE

## 2021-04-17 LAB — RENAL FUNCTION PANEL
Albumin: 2.8 g/dL — ABNORMAL LOW (ref 3.5–5.0)
Anion gap: 15 (ref 5–15)
BUN: 136 mg/dL — ABNORMAL HIGH (ref 6–20)
CO2: 17 mmol/L — ABNORMAL LOW (ref 22–32)
Calcium: 8.6 mg/dL — ABNORMAL LOW (ref 8.9–10.3)
Chloride: 105 mmol/L (ref 98–111)
Creatinine, Ser: 13.05 mg/dL — ABNORMAL HIGH (ref 0.44–1.00)
GFR, Estimated: 3 mL/min — ABNORMAL LOW (ref >60)
Glucose, Bld: 113 mg/dL — ABNORMAL HIGH (ref 70–99)
Phosphorus: 10.8 mg/dL — ABNORMAL HIGH (ref 2.5–4.6)
Potassium: 4.9 mmol/L (ref 3.5–5.1)
Sodium: 137 mmol/L (ref 135–145)

## 2021-04-17 LAB — HEPATITIS B SURFACE ANTIBODY,QUALITATIVE: Hep B S Ab: NONREACTIVE

## 2021-04-17 LAB — PTH, INTACT AND CALCIUM
Calcium, Total (PTH): 9 mg/dL (ref 8.7–10.2)
PTH: 603 pg/mL — ABNORMAL HIGH (ref 15–65)

## 2021-04-17 LAB — GLUCOSE, CAPILLARY
Glucose-Capillary: 104 mg/dL — ABNORMAL HIGH (ref 70–99)
Glucose-Capillary: 118 mg/dL — ABNORMAL HIGH (ref 70–99)
Glucose-Capillary: 133 mg/dL — ABNORMAL HIGH (ref 70–99)
Glucose-Capillary: 147 mg/dL — ABNORMAL HIGH (ref 70–99)
Glucose-Capillary: 169 mg/dL — ABNORMAL HIGH (ref 70–99)

## 2021-04-17 LAB — HEPATITIS B CORE ANTIBODY, TOTAL: Hep B Core Total Ab: NONREACTIVE

## 2021-04-17 MED ORDER — LIDOCAINE HCL (PF) 1 % IJ SOLN: 5.0000 mL | INTRAMUSCULAR | Status: DC | PRN

## 2021-04-17 MED ORDER — SODIUM CHLORIDE 0.9 % IV SOLN: 100.0000 mL | INTRAVENOUS | Status: DC | PRN

## 2021-04-17 MED ORDER — LIDOCAINE-PRILOCAINE 2.5-2.5 % EX CREA: 1.0000 "application " | TOPICAL_CREAM | CUTANEOUS | Status: DC | PRN

## 2021-04-17 MED ORDER — SODIUM CHLORIDE 0.9 % IV SOLN: 250.0000 mg | INTRAVENOUS | Status: DC

## 2021-04-17 MED ORDER — SODIUM CHLORIDE 0.9 % IV SOLN
250.0000 mg | INTRAVENOUS | Status: DC
  Filled 2021-04-17: qty 20

## 2021-04-17 MED ORDER — HEPARIN SODIUM (PORCINE) 1000 UNIT/ML DIALYSIS: 1000.0000 [IU] | INTRAMUSCULAR | Status: DC | PRN

## 2021-04-17 MED ORDER — ALTEPLASE 2 MG IJ SOLR: 2.0000 mg | Freq: Once | INTRAMUSCULAR | Status: DC | PRN

## 2021-04-17 MED ORDER — PENTAFLUOROPROP-TETRAFLUOROETH EX AERO: 1.0000 "application " | INHALATION_SPRAY | CUTANEOUS | Status: DC | PRN

## 2021-04-17 NOTE — Progress Notes (Signed)
Brief podiatry note ? ?Patient visited at bedside for follow-up discussion of x-ray results. ? ?X-ray right foot ?IMPRESSION: ?1. No definite radiographic findings of osteomyelitis. Poorly ?visualized third digit phalangeal tuft due to overlying densities. ?2.  No acute displaced fracture or dislocation. ? ?Discussed with patient extent of right foot wounds at this time they do appear to be superficial with negative x-ray findings however she is awaiting ABIs. ? ?Patient reports that she wants to hold off on getting the ABIs done and wants to try to continue with local wound care only at this time. ? ?I advised patient she should follow closely with her outpatient podiatrist and if wounds worsen she may need continued vascular work-up outpatient.  Patient is agreeable to this plan for now we will continue with Medihoney to the wounds daily and surgical shoe to prevent rubbing at ulcerations at the right second and third toes. ? ?Podiatry will sign off at this time no acute surgery or intervention needed. ? ?Reconsult if new problems or issues arise. ? ?Dr. Landis Martins ?On Call provider  ?[facility] ?[phone] office ?[phone] cell ?Available via secure chat   ?

## 2021-04-17 NOTE — Progress Notes (Signed)
?PROGRESS NOTE ? ? ? ?Holly [name]  VEL:[id number] DOB: 1966/01/24 DOA: 04/15/2021 ? ?PCP: [facility] [place]  ? 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  Patient also reported decreased urine output .  She reports that her nephrology team is planning on starting outpatient dialysis soon.  Labs in the ED include serum creatinine 12.15(rising from 4> 6> 8> in last 4 months) bladder scan showed more than 700 mL.  Foley catheter was inserted for urinary retention. Nephrologist was consulted, patient initiated on hemodialysis 3/23. ? ?Assessment & Plan: ?  ?Principal Problem: ?  Acute renal failure superimposed on stage 4 chronic kidney disease ([place]) ?Active Problems: ?  Acute urinary retention ?  Type 2 diabetes mellitus with chronic kidney disease, with long-term current use of insulin ([place]) ?  Hypertension associated with diabetes ([place]) ?  Anemia in chronic kidney disease ?  Depression ? ?AKI on CKD stage IV: ?Patient with progressively worsening renal functions  over the last 4 months with labs showing BUN 135, Creatinine 12.5, GFR 3 on admission. ?Patient has AV fistula placed in right upper extremity with plans for initiating outpatient dialysis soon. ?Nephrology is consulted by ED, patient initiated on hemodialysis.  Next dialysis 3/24, 3/25 ? ?Acute urinary retention: ?Bladder scan more than 700 mm on arrival. ?Likely contributing some degree of progressive renal dysfunction. ?Continue Foley catheter. ? ?Type 2 diabetes with CKD ?Hold p.o. diabetic medications.  ?Continue regular insulin sliding scale.  ? ?Essential hypertension: ?Continue labetalol and felodipine. ? ?Anemia of chronic disease: ?Hemoglobin  relatively stable. ? ?Depression: ?Continue Prozac ? ?Right foot chronic wound: ?Podiatry consulted.  X-ray no evidence of osteomyelitis. ?No signs of any infection.  No need for antibiotics. ?No indication for any surgical intervention. ?Obtain ABI to assess ability to heal with just local care ?Continue local care. ?  ? ?DVT prophylaxis:  Heparin sq ?Code Status:Full code, ?Family Communication: No family at bed side. ?Disposition Plan:  ? ?Status is: Inpatient ?Remains inpatient appropriate because: Admitted for AKI on CKD stage IV requiring hemodialysis.  Initiated on hemodialysis 3/23, next HD 3/24, 3/25 ? ?Consultants:  ?Nephrology ? ?Procedures: ?Initiation of  Hemodialysis 3/23 ?Antimicrobials: None ? ? ?Subjective: ?Patient was seen and examined at hemodialysis unit..  Overnight events noted. ?Patient reports feeling better, denies any pain, asking when she would be discharged. ? ?Objective: ?Vitals:  ? 04/17/21 0930 04/17/21 1000 04/17/21 1030 04/17/21 1104  ?BP: 126/60 119/60 111/68 (!) 141/67  ?Pulse: 72 72 70 72  ?Resp:   16 18  ?Temp:   97.8 ?F (36.6 ?C) 98.3 ?F (36.8 ?C)  ?TempSrc:   Temporal   ?SpO2:   98% 97%  ?Weight:   70.9 kg   ?Height:      ? ? ?Intake/Output Summary (Last 24 hours) at 04/17/2021 1234 ?Last data filed at 04/17/2021 1030 ?Gross per 24 hour  ?Intake 840 ml  ?Output 3150 ml  ?Net -2310 ml  ? ?Filed Weights  ? 04/16/21 0154 04/17/21 0755 04/17/21 1030  ?Weight: 73.3 kg 72.9 kg 70.9 kg  ? ? ?Examination: ? ?General exam: Appears comfortable, not in any acute distress, deconditioned. ?Respiratory system: Decreased breath sounds,  no wheezing, no crackles. Respiratory effort normal. ?Cardiovascular system: S1 & S2 heard,  regular rate and rhythm, no murmur.   ?Gastrointestinal system: Abdomen is soft, non tender, non distended, BS+ ?Central nervous system: Alert and oriented x 3. No focal neurological deficits. ?Extremities: Edema+, chronic erythematous non healing wound on the right  toes, R UE AV fistula. ?Skin: No rashes, lesions or ulcers ?Psychiatry: Judgement and insight appear normal. Mood & affect appropriate.  ? ? ? ?Data Reviewed: I have personally reviewed following labs and imaging studies ? ?CBC: ?Recent Labs  ?Lab 04/15/21 ?1607 04/16/21 ?0615 04/17/21 ?0239 04/17/21 ?0450  ?WBC 6.5 4.9 6.0 5.7  ?NEUTROABS 5.0  --   --   --   ?HGB 8.5* 8.2* 8.1* 7.9*  ?HCT 28.3* 26.8* 26.2* 25.6*  ?MCV 91.9 90.2 90.3 89.8  ?PLT 163 150 135* 144*  ? ?Basic Metabolic Panel: ?Recent Labs  ?Lab 04/15/21 ?1607 04/16/21 ?0615 04/16/21 ?1130 04/17/21 ?0239 04/17/21 ?0450  ?NA 139 136  --  137 137  ?K 4.8 4.6  --  5.0 4.9  ?CL 102 102  --  106 105  ?CO2 18* 18*  --  16* 17*  ?GLUCOSE 234* 88  --  133* 113*  ?BUN 135* 136*  --  137* 136*  ?CREATININE 12.15* 12.35*  --  12.97* 13.05*  ?CALCIUM 9.0 9.0 9.0 8.6* 8.6*  ?PHOS  --   --   --   --  10.8*  ? ?GFR: ?Estimated Creatinine Clearance: 4.5 mL/min (A) (by C-G formula based on SCr of 13.05 mg/dL (H)). ?Liver Function Tests: ?Recent Labs  ?Lab 04/15/21 ?1607 04/17/21 ?0450  ?AST 15  --   ?ALT 17  --   ?ALKPHOS 92  --   ?BILITOT 0.6  --   ?PROT 5.9*  --   ?ALBUMIN 3.3* 2.8*  ? ?Recent Labs  ?Lab 04/15/21 ?1607  ?LIPASE 27  ? ?No results for input(s): AMMONIA in the last 168 hours. ?Coagulation Profile: ?No results for input(s): INR, PROTIME in the last 168 hours. ?Cardiac Enzymes: ?No results for input(s): CKTOTAL, CKMB, CKMBINDEX, TROPONINI in the last 168 hours. ?BNP (last 3 results) ?No results for input(s): PROBNP in the last 8760 hours. ?HbA1C: ?Recent Labs  ?  04/16/21 ?0615  ?HGBA1C 6.3*  ? ?CBG: ?Recent Labs  ?Lab 04/16/21 ?1624 04/16/21 ?2030 04/17/21 ?0719 04/17/21 ?0756 04/17/21 ?1102  ?GLUCAP 94 158* 104* 118* 133*  ? ?Lipid Profile: ?No results for input(s): CHOL, HDL, LDLCALC, TRIG, CHOLHDL, LDLDIRECT in the last 72 hours. ?Thyroid Function Tests: ?No results for input(s): TSH, T4TOTAL, FREET4, T3FREE, THYROIDAB in the last 72 hours. ?Anemia  Panel: ?Recent Labs  ?  04/16/21 ?0615  ?FERRITIN 303  ?TIBC 189*  ?IRON 32  ? ?Sepsis Labs: ?No results for input(s): PROCALCITON, LATICACIDVEN in the last 168 hours. ? ?Recent Results (from the past 240 hour(s))  ?MRSA Next Gen by PCR, Nasal     Status: None  ? Collection Time: 04/16/21  2:48 AM  ? Specimen: Nasal Mucosa; Nasal Swab  ?Result Value Ref Range Status  ? MRSA by PCR Next Gen NOT DETECTED NOT DETECTED Final  ?  Comment: (NOTE) ?The GeneXpert MRSA Assay (FDA approved for NASAL specimens only), ?is one component of a comprehensive MRSA colonization surveillance ?program. It is not intended to diagnose MRSA infection nor to guide ?or monitor treatment for MRSA infections. ?Test performance is not FDA approved in patients less than 2 years ?old. ?Performed at [facility], [place] [address], [place], [place] ?[address] ?  ?  ?Radiology Studies: ?DG  Chest Portable 1 View ? ?Result Date: 04/15/2021 ?CLINICAL DATA:  End-stage renal disease. EXAM: PORTABLE CHEST 1 VIEW COMPARISON:  Chest x-ray 12/04/2020. FINDINGS: Cardiac silhouette is enlarged. There are some linear opacities in the left mid lung. There is no pleural effusion or pneumothorax. There is no central pulmonary vascular congestion. No acute fractures are seen. IMPRESSION: 1. Stable cardiomegaly. 2. Linear opacities in the left mid lung favored is atelectasis. Electronically Signed   By: Ronney Asters M.D.   On: 04/15/2021 23:56  ? ?DG Foot 2 Views Right ? ?Result Date: 04/16/2021 ?CLINICAL DATA:  Wound cellulitis EXAM: RIGHT FOOT - 2 VIEW COMPARISON:  None. FINDINGS: Poorly visualized third digit phalangeal tuft due to overlying densities. No cortical erosion or destruction. There is no evidence of fracture or dislocation. There is no evidence of arthropathy or other focal bone abnormality. Soft tissues are unremarkable. Vascular calcifications. IMPRESSION: 1. No definite radiographic findings of osteomyelitis. Poorly visualized third digit  phalangeal tuft due to overlying densities. 2.  No acute displaced fracture or dislocation. Electronically Signed   By: Iven Finn M.D.   On: 04/16/2021 18:53   ? ?Scheduled Meds: ? Chlorhexidine Gluconate Cloth

## 2021-04-17 NOTE — Progress Notes (Signed)
Awaiting confirmation from [facility] admissions regarding pt's chair time. There has been conversations of need to change chair time at [facility]. Once confirmed, will f/u with providers and pt.  ? ?Melven Sartorius ?Renal Navigator ?[phone] ?

## 2021-04-17 NOTE — Progress Notes (Signed)
?[facility] ?Progress Note  ? ? ?Assessment/ Plan:   ?CKD5, now ESRD ?-initiated HD 3/23 given that her volume status was refractory to escalating doses diuretics as an outpatient. Was supposed to start HD as an outpatient but presented her. ?-slow start protocol with HD. HD#1 3/23, #2 treatment tomorrow, anticipating that her #3 treatment will be on Saturday. ?-CLIP: [facility] (TCU): mon, tues, thurs, fri chair ?-Continue to monitor daily Cr, Dose meds for GFR<15 ?-Maintain MAP>65 for optimal renal perfusion.  ?-Avoid nephrotoxic medications including NSAIDs and iodinated intravenous contrast exposure unless the latter is absolutely indicated.  Preferred narcotic agents for pain control are hydromorphone, fentanyl, and methadone. Morphine should not be used. Avoid Baclofen and avoid oral sodium phosphate and magnesium citrate based laxatives / bowel preps. Continue strict Input and Output monitoring. Will monitor the patient closely with you and intervene or adjust therapy as indicated by changes in clinical status/labs  ?  ?Hypertension/volume: ?-resume home meds, UF as tolerated. BP acceptable ?  ?Metabolic acidosis ?-on MGNOI3--BCWU d/c now that she is on HD ?  ?Secondary hyperparathyroidism, CKD-MBD ?-c/w renvela. Low phos diet. Will check PTH ?  ?Anemia due to CKD: ?-Transfuse for Hgb<7 g/dL ?-start Fe load, total 1g ?  ?Diabetes Mellitus Type 2 with Hyperglycemia: per primary service ?  ?Dialysis access: Right brachiocephalic AV fistula placed in 2016-seems to be functional at this time.  If any issues with access pressures on HD, then we will consult IR for fistulogram ?  ? ?Subjective:   ?Patient seen and examined on HD.  First treatment today.  Tolerating HD thus far.  She reports that she feels much better in regards to her abdominal swelling and her lower extremity swelling.  No other complaints.  ? ?Objective:   ?BP 119/60   Pulse 72   Temp (!) 97.3 ?F (36.3 ?C) (Temporal)   Resp 16    Ht 5\' 6"  (1.676 m)   Wt 72.9 kg   LMP 07/17/2011   SpO2 100%   BMI 25.94 kg/m?  ? ?Intake/Output Summary (Last 24 hours) at 04/17/2021 1024 ?Last data filed at 04/17/2021 0730 ?Gross per 24 hour  ?Intake 840 ml  ?Output 1400 ml  ?Net -560 ml  ? ?Weight change:  ? ?Physical Exam: ?Gen:nad ?CVS:rrr ?Resp:normal wob ?GQB:VQXI, nd, nt ?HWT:UUEKC edema b/l LE's ?Neuro: awake, alert ?Dialysis access: RUE AVF (in use) ? ?Imaging: ?DG Chest Portable 1 View ? ?Result Date: 04/15/2021 ?CLINICAL DATA:  End-stage renal disease. EXAM: PORTABLE CHEST 1 VIEW COMPARISON:  Chest x-ray 12/04/2020. FINDINGS: Cardiac silhouette is enlarged. There are some linear opacities in the left mid lung. There is no pleural effusion or pneumothorax. There is no central pulmonary vascular congestion. No acute fractures are seen. IMPRESSION: 1. Stable cardiomegaly. 2. Linear opacities in the left mid lung favored is atelectasis. Electronically Signed   By: Ronney Asters M.D.   On: 04/15/2021 23:56  ? ?DG Foot 2 Views Right ? ?Result Date: 04/16/2021 ?CLINICAL DATA:  Wound cellulitis EXAM: RIGHT FOOT - 2 VIEW COMPARISON:  None. FINDINGS: Poorly visualized third digit phalangeal tuft due to overlying densities. No cortical erosion or destruction. There is no evidence of fracture or dislocation. There is no evidence of arthropathy or other focal bone abnormality. Soft tissues are unremarkable. Vascular calcifications. IMPRESSION: 1. No definite radiographic findings of osteomyelitis. Poorly visualized third digit phalangeal tuft due to overlying densities. 2.  No acute displaced fracture or dislocation. Electronically Signed   By: Thomasena Edis  Mckinley Jewel M.D.   On: 04/16/2021 18:53   ? ?Labs: ?BMET ?Recent Labs  ?Lab 04/15/21 ?1607 04/16/21 ?0615 04/17/21 ?0239 04/17/21 ?0450  ?NA 139 136 137 137  ?K 4.8 4.6 5.0 4.9  ?CL 102 102 106 105  ?CO2 18* 18* 16* 17*  ?GLUCOSE 234* 88 133* 113*  ?BUN 135* 136* 137* 136*  ?CREATININE 12.15* 12.35* 12.97* 13.05*   ?CALCIUM 9.0 9.0 8.6* 8.6*  ?PHOS  --   --   --  10.8*  ? ?CBC ?Recent Labs  ?Lab 04/15/21 ?1607 04/16/21 ?0615 04/17/21 ?0239 04/17/21 ?0450  ?WBC 6.5 4.9 6.0 5.7  ?NEUTROABS 5.0  --   --   --   ?HGB 8.5* 8.2* 8.1* 7.9*  ?HCT 28.3* 26.8* 26.2* 25.6*  ?MCV 91.9 90.2 90.3 89.8  ?PLT 163 150 135* 144*  ? ? ?Medications:   ? ? Chlorhexidine Gluconate Cloth  6 each Topical Q0600  ? Chlorhexidine Gluconate Cloth  6 each Topical Q0600  ? felodipine  5 mg Oral QHS  ? FLUoxetine  40 mg Oral q morning  ? heparin  5,000 Units Subcutaneous Q8H  ? insulin aspart  0-5 Units Subcutaneous QHS  ? insulin aspart  0-9 Units Subcutaneous TID WC  ? labetalol  200 mg Oral BID  ? leptospermum manuka honey  1 application. Topical Daily  ? mupirocin ointment  1 application. Nasal BID  ? rOPINIRole  1 mg Oral QHS  ? sevelamer carbonate  800 mg Oral TID WC  ? sodium bicarbonate  1,300 mg Oral BID  ? zolpidem  5 mg Oral QHS  ? ? ? ? ?Gean Quint, MD ?[facility] ?04/17/2021, 10:24 AM  ? ?

## 2021-04-18 ENCOUNTER — Inpatient Hospital Stay (HOSPITAL_COMMUNITY): Payer: Commercial Managed Care - PPO

## 2021-04-18 DIAGNOSIS — E1122 Type 2 diabetes mellitus with diabetic chronic kidney disease: Secondary | ICD-10-CM

## 2021-04-18 DIAGNOSIS — I4891 Unspecified atrial fibrillation: Secondary | ICD-10-CM

## 2021-04-18 DIAGNOSIS — Z794 Long term (current) use of insulin: Secondary | ICD-10-CM

## 2021-04-18 DIAGNOSIS — D631 Anemia in chronic kidney disease: Secondary | ICD-10-CM

## 2021-04-18 LAB — RENAL FUNCTION PANEL
Albumin: 2.8 g/dL — ABNORMAL LOW (ref 3.5–5.0)
Anion gap: 17 — ABNORMAL HIGH (ref 5–15)
BUN: 115 mg/dL — ABNORMAL HIGH (ref 6–20)
CO2: 17 mmol/L — ABNORMAL LOW (ref 22–32)
Calcium: 8.7 mg/dL — ABNORMAL LOW (ref 8.9–10.3)
Chloride: 104 mmol/L (ref 98–111)
Creatinine, Ser: 11.66 mg/dL — ABNORMAL HIGH (ref 0.44–1.00)
GFR, Estimated: 3 mL/min — ABNORMAL LOW (ref >60)
Glucose, Bld: 131 mg/dL — ABNORMAL HIGH (ref 70–99)
Phosphorus: 9.2 mg/dL — ABNORMAL HIGH (ref 2.5–4.6)
Potassium: 4.7 mmol/L (ref 3.5–5.1)
Sodium: 138 mmol/L (ref 135–145)

## 2021-04-18 LAB — TROPONIN I (HIGH SENSITIVITY)
Troponin I (High Sensitivity): 208 ng/L (ref <18)
Troponin I (High Sensitivity): 223 ng/L (ref <18)

## 2021-04-18 LAB — ECHOCARDIOGRAM COMPLETE
AR max vel: 2.3 cm2
AV Area VTI: 2.77 cm2
AV Area mean vel: 2.65 cm2
AV Mean grad: 4 mmHg
AV Peak grad: 8.5 mmHg
Ao pk vel: 1.46 m/s
Area-P 1/2: 2.37 cm2
Height: 66 in
MV VTI: 1.26 cm2
S' Lateral: 3 cm
Weight: 2455.04 oz

## 2021-04-18 LAB — CBC
HCT: 25 % — ABNORMAL LOW (ref 36.0–46.0)
Hemoglobin: 7.8 g/dL — ABNORMAL LOW (ref 12.0–15.0)
MCH: 28.2 pg (ref 26.0–34.0)
MCHC: 31.2 g/dL (ref 30.0–36.0)
MCV: 90.3 fL (ref 80.0–100.0)
Platelets: 117 10*3/uL — ABNORMAL LOW (ref 150–400)
RBC: 2.77 MIL/uL — ABNORMAL LOW (ref 3.87–5.11)
RDW: 15.4 % (ref 11.5–15.5)
WBC: 4.8 10*3/uL (ref 4.0–10.5)
nRBC: 0 % (ref 0.0–0.2)

## 2021-04-18 LAB — GLUCOSE, CAPILLARY
Glucose-Capillary: 142 mg/dL — ABNORMAL HIGH (ref 70–99)
Glucose-Capillary: 245 mg/dL — ABNORMAL HIGH (ref 70–99)
Glucose-Capillary: 231 mg/dL — ABNORMAL HIGH (ref 70–99)

## 2021-04-18 LAB — HEPATITIS B SURFACE ANTIBODY, QUANTITATIVE
Hep B S AB Quant (Post): <3.1 m[IU]/mL — ABNORMAL LOW (ref >9.9)
Hep B S AB Quant (Post): <3.1 m[IU]/mL — ABNORMAL LOW (ref >9.9)

## 2021-04-18 MED ORDER — HEPARIN (PORCINE) 25000 UT/250ML-% IV SOLN
1400.0000 [IU]/h | INTRAVENOUS | Status: DC
  Administered 2021-04-18: 1000 [IU]/h via INTRAVENOUS
  Administered 2021-04-19: 1150 [IU]/h via INTRAVENOUS
  Filled 2021-04-18 (×3): qty 250

## 2021-04-18 MED ORDER — DILTIAZEM HCL 30 MG PO TABS
30.0000 mg | ORAL_TABLET | Freq: Three times a day (TID) | ORAL | Status: DC
  Administered 2021-04-18 – 2021-04-19 (×3): 30 mg via ORAL
  Filled 2021-04-18 (×3): qty 1

## 2021-04-18 MED ORDER — DILTIAZEM HCL-DEXTROSE 125-5 MG/125ML-% IV SOLN (PREMIX)
5.0000 mg/h | INTRAVENOUS | Status: DC
  Filled 2021-04-18 (×2): qty 125

## 2021-04-18 NOTE — Progress Notes (Signed)
?[facility] ?Progress Note  ? ? ?Assessment/ Plan:   ?CKD5, now ESRD ?-initiated HD 3/23 given that her volume status was refractory to escalating doses diuretics as an outpatient. Was supposed to start HD as an outpatient but presented her. ?-slow start protocol with HD. HD#1 3/23, #2 today and #3 ordered for tomorrow ?-CLIP: [facility] (TCU): mon, tues, thurs, fri chair ?-Continue to monitor daily Cr, Dose meds for GFR<15 ?-Maintain MAP>65 for optimal renal perfusion.  ?-Avoid nephrotoxic medications including NSAIDs and iodinated intravenous contrast exposure unless the latter is absolutely indicated.  Preferred narcotic agents for pain control are hydromorphone, fentanyl, and methadone. Morphine should not be used. Avoid Baclofen and avoid oral sodium phosphate and magnesium citrate based laxatives / bowel preps. Continue strict Input and Output monitoring. Will monitor the patient closely with you and intervene or adjust therapy as indicated by changes in clinical status/labs  ?  ?Hypertension/volume: ?-resume home meds, UF as tolerated. BP acceptable ?  ?Metabolic acidosis ?-improving w/ HD ?  ?Secondary hyperparathyroidism, CKD-MBD ?-c/w renvela. Low phos diet. PTH 603. CTM. Hopefully will improve as phos improves ?  ?Anemia due to CKD: ?-Transfuse for Hgb<7 g/dL ?-start Fe load, total 1g ?  ?Diabetes Mellitus Type 2 with Hyperglycemia: per primary service ?  ?Dialysis access: Right brachiocephalic AV fistula placed in 2016 functioning well ?  ? ?Subjective:   ?Feels well with no complaints. Tolerating HD today  ? ?Objective:   ?BP 124/64   Pulse 76   Temp 97.9 ?F (36.6 ?C)   Resp 17   Ht 5\' 6"  (1.676 m)   Wt 69.6 kg   LMP 07/17/2011   SpO2 98%   BMI 24.77 kg/m?  ? ?Intake/Output Summary (Last 24 hours) at 04/18/2021 0749 ?Last data filed at 04/18/2021 0449 ?Gross per 24 hour  ?Intake 700 ml  ?Output 3300 ml  ?Net -2600 ml  ? ?Weight change:  ? ?Physical Exam: ?Gen:nad, chronically ill  appearing, in bed ?OZH:YQMVHQ rate, no rub ?Resp:normal wob, bilateral chest rise ?ION:GEXB, nd, nt ?MWU:XLKGM edema b/l LE's ?Neuro: awake, alert ?Dialysis access: RUE AVF (in use) ? ?Imaging: ?DG Foot 2 Views Right ? ?Result Date: 04/16/2021 ?CLINICAL DATA:  Wound cellulitis EXAM: RIGHT FOOT - 2 VIEW COMPARISON:  None. FINDINGS: Poorly visualized third digit phalangeal tuft due to overlying densities. No cortical erosion or destruction. There is no evidence of fracture or dislocation. There is no evidence of arthropathy or other focal bone abnormality. Soft tissues are unremarkable. Vascular calcifications. IMPRESSION: 1. No definite radiographic findings of osteomyelitis. Poorly visualized third digit phalangeal tuft due to overlying densities. 2.  No acute displaced fracture or dislocation. Electronically Signed   By: Iven Finn M.D.   On: 04/16/2021 18:53   ? ?Labs: ?BMET ?Recent Labs  ?Lab 04/15/21 ?1607 04/16/21 ?0615 04/16/21 ?1130 04/17/21 ?0239 04/17/21 ?0102 04/18/21 ?0631  ?NA 139 136  --  137 137 138  ?K 4.8 4.6  --  5.0 4.9 4.7  ?CL 102 102  --  106 105 104  ?CO2 18* 18*  --  16* 17* 17*  ?GLUCOSE 234* 88  --  133* 113* 131*  ?BUN 135* 136*  --  137* 136* 115*  ?CREATININE 12.15* 12.35*  --  12.97* 13.05* 11.66*  ?CALCIUM 9.0 9.0 9.0 8.6* 8.6* 8.7*  ?PHOS  --   --   --   --  10.8* 9.2*  ? ?CBC ?Recent Labs  ?Lab 04/15/21 ?1607 04/16/21 ?0615 04/17/21 ?0239 04/17/21 ?  8718 04/18/21 ?0631  ?WBC 6.5 4.9 6.0 5.7 4.8  ?NEUTROABS 5.0  --   --   --   --   ?HGB 8.5* 8.2* 8.1* 7.9* 7.8*  ?HCT 28.3* 26.8* 26.2* 25.6* 25.0*  ?MCV 91.9 90.2 90.3 89.8 90.3  ?PLT 163 150 135* 144* 117*  ? ? ?Medications:   ? ? Chlorhexidine Gluconate Cloth  6 each Topical Q0600  ? Chlorhexidine Gluconate Cloth  6 each Topical Q0600  ? felodipine  5 mg Oral QHS  ? FLUoxetine  40 mg Oral q morning  ? heparin  5,000 Units Subcutaneous Q8H  ? insulin aspart  0-5 Units Subcutaneous QHS  ? insulin aspart  0-9 Units Subcutaneous TID WC   ? labetalol  200 mg Oral BID  ? leptospermum manuka honey  1 application. Topical Daily  ? mupirocin ointment  1 application. Nasal BID  ? rOPINIRole  1 mg Oral QHS  ? sevelamer carbonate  800 mg Oral TID WC  ? zolpidem  5 mg Oral QHS  ? ? ? ?Reesa Chew ? ?[facility] ?04/18/2021, 7:49 AM  ? ?

## 2021-04-18 NOTE — Progress Notes (Signed)
Pt has been cleared and approved at [facility] ([facility]) on a Mon/Tues/Thurs/Fri schedule. Pt can start on Monday if stable for d/c over the weekend. Clinic will need orders at d/c. Pt would need to arrive at 7:00 on Monday to complete paperwork prior to 7:30 chair time. Met with pt at bedside. Pt provided schedule letter with above details noted. Will also document appts on AVS as well. Pt agreeable to accepting TCU schedule due to there are no early first shift appts on TTS in the [place] area. Pt does voice possible interest in home therapies in the future. Explained that TCU would provide education on home therapy options and pt could then decide if she feels home therapy is something she can do. Pt voices understanding and agreeable to plan. Update provided to nephrologist and renal PA. Will assist as needed.  ? ?Melven Sartorius ?Renal Navigator ?[phone] ?

## 2021-04-18 NOTE — Plan of Care (Signed)
?  Problem: Health Behavior/Discharge Planning: ?Goal: Ability to manage health-related needs will improve ?Outcome: Progressing ?  ?Problem: Education: ?Goal: Knowledge of disease and its progression will improve ?Outcome: Progressing ?  ?

## 2021-04-18 NOTE — Progress Notes (Signed)
Echocardiogram ?2D Echocardiogram has been performed. ? ?Arlyss Gandy ?04/18/2021, 1:34 PM ?

## 2021-04-18 NOTE — Procedures (Signed)
1533mls positive fluid balance.  1 hour into treatment patient HR elevated in 140's  bp stable at this time, minutes later pt complained of dizziness rechecp ed bp now low 27C systolic. gave several NS boluses. stopped fluid remocalled. notified nephrology whom ordered more fluid boluses and an ekg to be done at that time, after reading ekg it was decided to end treatment at that time 50 minutes early.  bp remained low and hr remained elevated despite 1.5 liters of extra fluid.  educated patient on what happened .  pre bp 124/64 post bp 70/60 pre weight 69.6kg post weight 71.4kg.  unable to give ordered ferrelicit due to it arriving to hd unit after i stopped her HD treatment.  2 bandages to rua avf no bleeding dressing cdi.  pt ran a total of 1hr 45 minutes.  ?

## 2021-04-18 NOTE — Progress Notes (Signed)
Orthopedic Tech Progress Note ?Patient Details:  ?Ta Fair Redd ?31-Oct-1965 ?[id number] ? ?Ortho Devices ?Type of Ortho Device: Post (long leg) splint ?Ortho Device/Splint Location: rle ?Ortho Device/Splint Interventions: Adjustment ? Dropped off post op shoes to nurse. ?  ? ?Edwina Barth ?04/18/2021, 7:33 PM ? ?

## 2021-04-18 NOTE — Progress Notes (Signed)
Report called to [facility] for transfer. ?

## 2021-04-18 NOTE — Progress Notes (Signed)
?PROGRESS NOTE ? ? ? ?Holly [name]  QIH:[id number] DOB: May 16, 1965 DOA: 04/15/2021 ? ?PCP: [facility] [place]  ? 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  Patient also reported decreased urine output .  She reports that her nephrology team is planning on starting outpatient dialysis soon.  Labs in the ED include serum creatinine 12.15(rising from 4> 6> 8> in last 4 months) bladder scan showed more than 700 mL.  Foley catheter was inserted for urinary retention. Nephrologist was consulted, patient initiated on hemodialysis 3/23.  Went into a fib with RVR 3/24 during HD.   ? ?Assessment & Plan: ?  ?Principal Problem: ?  Acute renal failure superimposed on stage 4 chronic kidney disease ([place]) ?Active Problems: ?  Acute urinary retention ?  Type 2 diabetes mellitus with chronic kidney disease, with long-term current use of insulin ([place]) ?  Hypertension associated with diabetes ([place]) ?  Anemia in chronic kidney disease ?  Depression ? ? ? ?AKI on CKD stage IV: ?Patient with progressively worsening renal functions  over the last 4 months with labs showing BUN 135, Creatinine 12.5, GFR 3 on admission. ?Patient has AV fistula placed in right upper extremity with plans for initiating outpatient dialysis soon. ?Nephrology is consulted by ED, patient initiated on hemodialysis.  Next dialysis 3/24, 3/25 ? ?A fib with RVR ?-new ?-cards consult ?-cardizem ?-heparin gtt ?-echo ?-TSH ? ?Acute urinary retention: ?Bladder scan more than 700 mm on arrival. ?Likely contributing some degree of progressive renal dysfunction. ?D/c foley catheter- voiding trial ? ?Type 2 diabetes with CKD ?Hold p.o. diabetic medications.  ?Continue regular  insulin sliding scale.  ? ?Essential hypertension: ?-d/c ? ?Anemia of chronic disease: ?Hemoglobin relatively stable. ? ?Depression: ?Continue Prozac ? ?Right foot chronic wound: ?Podiatry consulted.  X-ray no evidence of osteomyelitis. ?No signs of any infection.  No need for antibiotics. ?No indication for any surgical intervention. ?-patient declined ABIs ?  ? ?DVT prophylaxis:  Heparin gtt ?Code Status:Full code, ?Family Communication: No family at bed side. ?Disposition Plan:  ? ?Status is: Inpatient ?Remains inpatient appropriate because: need HD ? ?Consultants:  ?Nephrology ?Podiatry ?cards ? ? ? ? ?Subjective: ?No CP, no SOB- does feel heart racing ? ?Objective: ?Vitals:  ? 04/18/21 0900 04/18/21 0905 04/18/21 0919 04/18/21 0927  ?BP: (!) 97/54 (!) 82/58 (!) 70/60 (!) 90/54  ?Pulse: (!) 105 71 (!) 126   ?Resp: 20 18 16 16   ?Temp:      ?TempSrc:      ?SpO2:  100% 100% 100%  ?Weight:      ?Height:      ? ? ?Intake/Output Summary (Last 24 hours) at 04/18/2021 1427 ?Last data filed at 04/18/2021 8756 ?Gross per 24 hour  ?Intake 480 ml  ?Output -334 ml  ?Net 814 ml  ? ?Filed Weights  ? 04/17/21 1030 04/18/21 0448 04/18/21 0729  ?Weight: 70.9 kg 69.8 kg 69.6 kg  ? ? ?Examination: ? ? ?General: Appearance:     ?Overweight female in no acute distress  ?   ?Lungs:     Clear to auscultation bilaterally, respirations unlabored  ?Heart:    Normal heart rate. irregular ?  ?   ?Neurologic:   Awake, alert, oriented x 3. No apparent focal neurological  defect.   ?  ? ? ? ?Data Reviewed: I have personally reviewed following labs and imaging studies ? ?CBC: ?Recent Labs  ?Lab 04/15/21 ?1607 04/16/21 ?0615 04/17/21 ?0239 04/17/21 ?3151 04/18/21 ?0631  ?WBC 6.5 4.9 6.0 5.7 4.8  ?NEUTROABS 5.0  --   --   --   --   ?HGB 8.5* 8.2* 8.1* 7.9* 7.8*  ?HCT 28.3* 26.8* 26.2* 25.6* 25.0*  ?MCV 91.9 90.2 90.3 89.8 90.3  ?PLT 163 150 135* 144* 117*  ? ?Basic Metabolic Panel: ?Recent Labs  ?Lab 04/15/21 ?1607 04/16/21 ?0615  04/16/21 ?1130 04/17/21 ?0239 04/17/21 ?7616 04/18/21 ?0631  ?NA 139 136  --  137 137 138  ?K 4.8 4.6  --  5.0 4.9 4.7  ?CL 102 102  --  106 105 104  ?CO2 18* 18*  --  16* 17* 17*  ?GLUCOSE 234* 88  --  133* 113* 131*  ?BUN 135* 136*  --  137* 136* 115*  ?CREATININE 12.15* 12.35*  --  12.97* 13.05* 11.66*  ?CALCIUM 9.0 9.0 9.0 8.6* 8.6* 8.7*  ?PHOS  --   --   --   --  10.8* 9.2*  ? ?GFR: ?Estimated Creatinine Clearance: 5 mL/min (A) (by C-G formula based on SCr of 11.66 mg/dL (H)). ?Liver Function Tests: ?Recent Labs  ?Lab 04/15/21 ?1607 04/17/21 ?0450 04/18/21 ?0631  ?AST 15  --   --   ?ALT 17  --   --   ?ALKPHOS 92  --   --   ?BILITOT 0.6  --   --   ?PROT 5.9*  --   --   ?ALBUMIN 3.3* 2.8* 2.8*  ? ?Recent Labs  ?Lab 04/15/21 ?1607  ?LIPASE 27  ? ?No results for input(s): AMMONIA in the last 168 hours. ?Coagulation Profile: ?No results for input(s): INR, PROTIME in the last 168 hours. ?Cardiac Enzymes: ?No results for input(s): CKTOTAL, CKMB, CKMBINDEX, TROPONINI in the last 168 hours. ?BNP (last 3 results) ?No results for input(s): PROBNP in the last 8760 hours. ?HbA1C: ?Recent Labs  ?  04/16/21 ?0615  ?HGBA1C 6.3*  ? ?CBG: ?Recent Labs  ?Lab 04/17/21 ?0756 04/17/21 ?1102 04/17/21 ?1629 04/17/21 ?2050 04/18/21 ?1127  ?GLUCAP 118* 133* 147* 169* 142*  ? ?Lipid Profile: ?No results for input(s): CHOL, HDL, LDLCALC, TRIG, CHOLHDL, LDLDIRECT in the last 72 hours. ?Thyroid Function Tests: ?No results for input(s): TSH, T4TOTAL, FREET4, T3FREE, THYROIDAB in the last 72 hours. ?Anemia Panel: ?Recent Labs  ?  04/16/21 ?0615  ?FERRITIN 303  ?TIBC 189*  ?IRON 32  ? ?Sepsis Labs: ?No results for input(s): PROCALCITON, LATICACIDVEN in the last 168 hours. ? ?Recent Results (from the past 240 hour(s))  ?MRSA Next Gen by PCR, Nasal     Status: None  ? Collection Time: 04/16/21  2:48 AM  ? Specimen: Nasal Mucosa; Nasal Swab  ?Result Value Ref Range Status  ? MRSA by PCR Next Gen NOT DETECTED NOT DETECTED Final  ?  Comment:  (NOTE) ?The GeneXpert MRSA Assay (FDA approved for NASAL specimens only), ?is one component of a comprehensive MRSA colonization surveillance ?program. It is not intended to diagnose MRSA infection nor to guide ?or monitor treatment for MRSA infections. ?Test performance is not FDA approved in patients less than 2 years ?old. ?Performed at [facility], [place] [address], [place], [place] ?[address] ?  ?  ?Radiology Studies: ?DG Foot 2 Views Right ? ?Result Date: 04/16/2021 ?CLINICAL DATA:  Wound cellulitis EXAM: RIGHT FOOT - 2  VIEW COMPARISON:  None. FINDINGS: Poorly visualized third digit phalangeal tuft due to overlying densities. No cortical erosion or destruction. There is no evidence of fracture or dislocation. There is no evidence of arthropathy or other focal bone abnormality. Soft tissues are unremarkable. Vascular calcifications. IMPRESSION: 1. No definite radiographic findings of osteomyelitis. Poorly visualized third digit phalangeal tuft due to overlying densities. 2.  No acute displaced fracture or dislocation. Electronically Signed   By: Iven Finn M.D.   On: 04/16/2021 18:53   ? ?Scheduled Meds: ? Chlorhexidine Gluconate Cloth  6 each Topical Q0600  ? Chlorhexidine Gluconate Cloth  6 each Topical Q0600  ? FLUoxetine  40 mg Oral q morning  ? insulin aspart  0-5 Units Subcutaneous QHS  ? insulin aspart  0-9 Units Subcutaneous TID WC  ? leptospermum manuka honey  1 application. Topical Daily  ? mupirocin ointment  1 application. Nasal BID  ? rOPINIRole  1 mg Oral QHS  ? sevelamer carbonate  800 mg Oral TID WC  ? zolpidem  5 mg Oral QHS  ? ?Continuous Infusions: ? diltiazem (CARDIZEM) infusion    ? ferric gluconate (FERRLECIT) IVPB    ? heparin    ? ? ? LOS: 3 days  ? ? ?Time spent: 35 mins ? ? ? ?Geradine Girt, DO ?[facility] ? ? ?If 7PM-7AM, please contact night-coverage  ?

## 2021-04-18 NOTE — Progress Notes (Addendum)
Paged by dialysis RN for tachycardia. 55min into HD, developed HR in 140's - A.fib on monitor - with hypotension, 1.2L removed at that time. UF off, given 525mL NS bolus by RN. Persistent tachycardia -> given another 510mL by RN. ? ?Assessed - persistent A-fib on monitor and hypotension. Electrolytes good on today's labs - K 4.7, Ca 8.7. Anemic at 7.8. No Hx A-fib in past - last EKG from 3/21 - NSR. No Hx ischemic heart disease, but very high risk with renal disease and DM. No edema on exam.  ? ?Plan: ?- Will give another 0.5L (net 1.5L) and continue HD without fluid removal. ?- Will order 12-lead EKG and text page per hospitalist regarding event -> consider cardiac evaluation. ? ?Veneta Penton, PA-C ?[facility] ?Pager [phone] ? ? ?Addendum (9:30am): ?12-L EKG with ST depression in V5, V6 -- still hypotensive. Will rinse back  (will get another 581mL with this) - spoke to hospitalist, she will review EKG and assess when she gets back to her room. ? ?Veneta Penton, PA-C ?[facility] ?Pager [phone] ? ?

## 2021-04-18 NOTE — Progress Notes (Signed)
Informed MD that pt converter to sinus rhythm prior to starting the Cardiazem. Per MD: hold Diltiazem, but keep at bedside in case it's needed. Start pt on PO Diltiazem with holding parameters. Continue Heparin drip. Pt made aware. ?

## 2021-04-18 NOTE — Progress Notes (Signed)
Patient refused redressing wound on her right 2nd and 3rd toe. would just like to apply medihoney and air dry. Educated patient. Verbalized understanding. Pt. Reiterated to keep it that way ?

## 2021-04-18 NOTE — Consult Note (Signed)
CARDIOLOGY CONSULT NOTE  ?Patient ID: ?Holly [name] ?MRN: [id number] ?DOB/AGE: May 12, 1965 56 y.o. ? ?Admit date: 04/15/2021 ?Attending physician: Geradine Girt, DO ?Primary Physician:  NA ?Outpatient Cardiologist: None ?Inpatient Cardiologist: Rex Kras, DO, [facility] ? ?Reason of consultation: Atrial fibrillation and ischemic work-up ?Referring physician: Geradine Girt, DO ? ?Chief complaint: Abdominal pain and lower extremity swelling ? ?HPI:  ?Holly [name] is a 56 y.o. 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 ? ?Patient has been followed closely as outpatient basis given progressive CKD with plans of starting hemodialysis soon.  But in the interim presents to hospital with abdominal pain and lower extremity swelling which she describes as volume overload.  Patient was started on hemodialysis during this hospitalization and during today's session session was noted to go into A-fib with RVR and was hypotensive.  Associated symptoms included palpitations, shortness of breath, and chest pain.  Cardiology is consulted for A-fib management and possible ischemic work-up given multiple high risk factors for CAD. ? ?Patient seen and evaluated at bedside.  She is currently eating lunch and denies any precordial discomfort and her shortness of breath is improved.  Patient states that she still appreciates palpitations but the intensity has improved.  She denies orthopnea, paroxysmal nocturnal dyspnea or lower extremity swelling. ? ? ?ALLERGIES: ?Allergies  ?Allergen Reactions  ? Bactrim Nausea And Vomiting  ? Byetta 10 Mcg Pen [Exenatide] Nausea And Vomiting  ? Hctz  [Hydrochlorothiazide] Other (See Comments) and Itching  ?  dizziness ?dizziness  ? Spironolactone Other (See Comments)  ?  Dizziness   ? Sulfa Antibiotics Nausea And Vomiting  ? Sulfacetamide Sodium Nausea And Vomiting  ? Sulfamethoxazole-Trimethoprim Nausea And Vomiting  ? ? ?PAST MEDICAL HISTORY: ?Past Medical History:  ?Diagnosis Date  ? Acute cystitis   ? Anxiety state, unspecified   ? Background diabetic retinopathy(362.01)   ? Bulimia   ? Calculus of kidney   ? Cellulitis and abscess of foot 01/24/2015  ? left foot  ? Chronic inflammatory demyelinating polyneuritis (HCC)   ? Chronic kidney disease, stage IV (severe) ([place])   ? Complication of anesthesia   ? slow to wake up, had lost a lot of blood - 2010  ? Diarrhea   ? symptomatic  ? Disorders of magnesium metabolism   ? DM2 (diabetes mellitus, type 2) ([place])   ? Dysthymic disorder   ? Dysuria   ? Edema   ? moderate  ? Encounter for long-term (current) use of other medications   ? Esophageal reflux   ? Essential hypertension, benign   ? Fever 10/2018  ? Heart murmur   ? been told very slight, never given her any problems  ? History of blood transfusion X 6-7; 2010 - present (03/29/2014)  ? "related to OR; kidney issues"  ? HLD (hyperlipidemia)   ? HDL goal >50, LDL goal <100  ? Iron deficiency anemia   ? "suppose to get procrit injections q 3 wks; usually don't" do it" (03/29/2014)  ? Irritable bowel syndrome   ? Left heart failure ([place])   ? Muscle weakness (generalized)   ? Neuralgia, neuritis, and radiculitis, unspecified   ? Palpitations   ? Peripheral autonomic neuropathy in disorders classified elsewhere(337.1)   ?  Pneumonia 2010  ? Primary pulmonary hypertension ([place])   ? not seen at CATH  (15m Hg), pt not aware of this  ? RLS (restless legs syndrome)   ? Type II or unspecified type diabetes mellitus with other specified manifestations, not stated as uncontrolled   ? [facility] arteries   ? Unspecified essential hypertension   ? [facility] No significant renal artery stenosis bilaterally  ? ? ?PAST SURGICAL HISTORY: ?Past Surgical History:  ?Procedure Laterality Date  ? AMPUTATION Left 01/26/2015  ? Procedure: POST TRANS MET ;  Surgeon: MNewt Minion MD;  [place]  Service: Orthopedics;  Laterality: Left;  ? APPENDECTOMY    ? AV FISTULA PLACEMENT Right 01/08/2015  ? Procedure: ARTERIOVENOUS (AV) FISTULA CREATION;  Surgeon: CAngelia Mould MD;  [place]  Service: Vascular;  Laterality: Right;  ? CESAREAN SECTION  2001; 1999; 1994  ? DILATION AND CURETTAGE OF UTERUS    ? EYE SURGERY    ? INCISION AND DRAINAGE OF WOUND Right 2010  ? "serious staph infection"  ? PARS PLANA VITRECTOMY Bilateral 2010  ? "related to DM"  ? TONSILLECTOMY AND ADENOIDECTOMY    ? ? ?FAMILY HISTORY: ?The patient's family history includes Hypertension in her mother; Myelodysplastic syndrome in her father; Pancreatic cancer in an other family member. ?  ?SOCIAL HISTORY:  ?The patient  reports that she has never smoked. She has never used smokeless tobacco. She reports current alcohol use. She reports that she does not use drugs. ? ?MEDICATIONS: ?Current Outpatient Medications  ?Medication Instructions  ? felodipine (PLENDIL) 5 mg, Oral, Daily at bedtime  ? FLUoxetine (PROZAC) 40 mg, Oral, Every morning  ? furosemide (LASIX) 80-120 mg, Oral, See admin instructions, 120 mg bid<BR>May take 80 mg bid prn swelling  ? glucose blood (ONETOUCH VERIO) test strip 1 each, Other, 2 times daily, And lancets 2/day 250/.61  ? labetalol (NORMODYNE) 200 mg, Oral, 2 times daily  ? loperamide (IMODIUM A-D) 2-4 mg, Oral, 4 times daily PRN  ? MELATONIN GUMMIES PO 2 tablets, Oral, Daily at bedtime, Unsure of dose  ? metolazone (ZAROXOLYN) 5 mg, Oral, Daily  ? Ozempic (1 MG/DOSE) 1 mg, Subcutaneous, Every Wed  ? rOPINIRole (REQUIP) 1 MG tablet TAKE 1 TABLET EVERY DAY AS NEEDED   ? sevelamer carbonate (RENVELA) 800 mg, Oral, 3 times daily with meals  ? silver sulfADIAZINE (SILVADENE) 1 % cream 1 application., Topical, Daily, Right foot  ? sodium bicarbonate 1,300 mg, Oral, 2 times daily  ? Toujeo SoloStar 10 Units, Subcutaneous, Daily  ? zolpidem (AMBIEN) 5 mg, Oral, Daily at bedtime  ? ? ?REVIEW OF SYSTEMS: ?Review of Systems  ?Constitutional: Negative for chills and fever.  ?HENT:  Negative for hoarse voice and nosebleeds.   ?Eyes:  Negative for discharge, double vision and pain.  ?Cardiovascular:  Positive for chest pain and palpitations. Negative for claudication, dyspnea on exertion, leg swelling, near-syncope, orthopnea, paroxysmal nocturnal dyspnea and syncope.  ?Respiratory:  Positive for shortness of breath. Negative for hemoptysis.   ?Musculoskeletal:  Negative for muscle cramps and myalgias.  ?Gastrointestinal:  Negative for abdominal pain, constipation, diarrhea, hematemesis, hematochezia, melena, nausea and vomiting.  ?Neurological:  Positive for dizziness and light-headedness.  ?All other systems reviewed and are negative. ? ?PHYSICAL EXAM: ? ?  04/18/2021  ?  9:27 AM 04/18/2021  ?  9:19 AM 04/18/2021  ?  9:05  AM  ?Vitals with BMI  ?Systolic 90 70 82  ?Diastolic 54 60 58  ?Pulse  126 71  ? ? ? ?Intake/Output Summary (Last 24 hours) at 04/18/2021 1233 ?Last data filed at 04/18/2021 6283 ?Gross per 24 hour  ?Intake 700 ml  ?Output 466 ml  ?Net 234 ml  ?  ?Net IO Since Admission: -3,136 mL [04/18/21 1233] ? ?CONSTITUTIONAL: Appears older than stated age, hemodynamically stable, no acute distress.   ?SKIN: Skin is warm and dry. No rash noted. No cyanosis. No pallor. No jaundice ?HEAD: Normocephalic and atraumatic.  ?EYES: No scleral icterus ?MOUTH/THROAT: Moist oral membranes.  ?NECK: No JVD present. No thyromegaly noted. No carotid bruits  ?LYMPHATIC: No visible cervical adenopathy.  ?CHEST Normal respiratory effort. No intercostal retractions  ?LUNGS: Clear to auscultation  bilaterally.  No stridor. No wheezes. No rales.  ?CARDIOVASCULAR: Tachycardic, irregularly irregular, variable T5-V7, holosystolic murmur heard over the left lower sternal border, no rubs or gallops appreciated

## 2021-04-18 NOTE — Progress Notes (Signed)
ANTICOAGULATION CONSULT NOTE - Initial Consult ? ?Pharmacy Consult for Heparin ?Indication: atrial fibrillation ? ?Allergies  ?Allergen Reactions  ? Bactrim Nausea And Vomiting  ? Byetta 10 Mcg Pen [Exenatide] Nausea And Vomiting  ? Hctz [Hydrochlorothiazide] Other (See Comments) and Itching  ?  dizziness ?dizziness  ? Spironolactone Other (See Comments)  ?  Dizziness   ? Sulfa Antibiotics Nausea And Vomiting  ? Sulfacetamide Sodium Nausea And Vomiting  ? Sulfamethoxazole-Trimethoprim Nausea And Vomiting  ? ? ?Patient Measurements: ?Height: 5\' 6"  (167.6 cm) ?Weight: 69.6 kg (153 lb 7 oz) ?IBW/kg (Calculated) : 59.3 ?Heparin Dosing Weight: 69.6 kg ? ? ?Vital Signs: ?Temp: 97.9 ?F (36.6 ?C) (03/24 0729) ?Temp Source: Oral (03/24 0448) ?BP: 90/54 (03/24 0927) ?Pulse Rate: 126 (03/24 0919) ? ?Labs: ?Recent Labs  ?  04/17/21 ?0239 04/17/21 ?0450 04/18/21 ?0631 04/18/21 ?1125  ?HGB 8.1* 7.9* 7.8*  --   ?HCT 26.2* 25.6* 25.0*  --   ?PLT 135* 144* 117*  --   ?CREATININE 12.97* 13.05* 11.66*  --   ?TROPONINIHS  --   --   --  208*  ? ? ?Estimated Creatinine Clearance: 5 mL/min (A) (by C-G formula based on SCr of 11.66 mg/dL (H)). ? ? ?Medical History: ?Past Medical History:  ?Diagnosis Date  ? Acute cystitis   ? Anxiety state, unspecified   ? Background diabetic retinopathy(362.01)   ? Bulimia   ? Calculus of kidney   ? Cellulitis and abscess of foot 01/24/2015  ? left foot  ? Chronic inflammatory demyelinating polyneuritis (HCC)   ? Chronic kidney disease, stage IV (severe) ([place])   ? Complication of anesthesia   ? slow to wake up, had lost a lot of blood - 2010  ? Diarrhea   ? symptomatic  ? Disorders of magnesium metabolism   ? DM2 (diabetes mellitus, type 2) ([place])   ? Dysthymic disorder   ? Dysuria   ? Edema   ? moderate  ? Encounter for long-term (current) use of other medications   ? Esophageal reflux   ? Essential hypertension, benign   ? Fever 10/2018  ? Heart murmur   ? been told very slight, never given her any  problems  ? History of blood transfusion X 6-7; 2010 - present (03/29/2014)  ? "related to OR; kidney issues"  ? HLD (hyperlipidemia)   ? HDL goal >50, LDL goal <100  ? Iron deficiency anemia   ? "suppose to get procrit injections q 3 wks; usually don't" do it" (03/29/2014)  ? Irritable bowel syndrome   ? Left heart failure ([place])   ? Muscle weakness (generalized)   ? Neuralgia, neuritis, and radiculitis, unspecified   ? Palpitations   ? Peripheral autonomic neuropathy in disorders classified elsewhere(337.1)   ? Pneumonia 2010  ? Primary pulmonary hypertension ([place])   ? not seen at CATH  (85mm Hg), pt not aware of this  ? RLS (restless legs syndrome)   ? Type II or unspecified type diabetes mellitus with other specified manifestations, not stated as uncontrolled   ? Cardiac catheterization 7/10 at [facility] in [facility]: Normal coronary arteries  ? Unspecified essential hypertension   ? Renal Dopplers 12/23/09 at [facility] in [facility]: No significant renal artery stenosis bilaterally  ? ? ?Medications:  ?Scheduled:  ? Chlorhexidine Gluconate Cloth  6 each Topical Q0600  ? Chlorhexidine Gluconate Cloth  6 each Topical Q0600  ? FLUoxetine  40 mg Oral q morning  ? insulin aspart  0-5  Units Subcutaneous QHS  ? insulin aspart  0-9 Units Subcutaneous TID WC  ? leptospermum manuka honey  1 application. Topical Daily  ? mupirocin ointment  1 application. Nasal BID  ? rOPINIRole  1 mg Oral QHS  ? sevelamer carbonate  800 mg Oral TID WC  ? zolpidem  5 mg Oral QHS  ? ?Infusions:  ? diltiazem (CARDIZEM) infusion    ? ferric gluconate (FERRLECIT) IVPB    ? ? ?Assessment: ?56 yo F admitted with abd pain and LE edema.  Pt has hx of CKD V now with transition to dialysis.  Patient was at HD session today and went into Afib with RVR.  Cardiology has been consulted.  Pharmacy has been asked to start heparin infusion.  Of note, pt last received SQ heparin 3/24 at 0600. ? ?Goal of Therapy:  ?Heparin level 0.3-0.7  units/ml ?Monitor platelets by anticoagulation protocol: Yes ?  ?Plan:  ?Start heparin infusion at 1000 units/hr ?Heparin level in 8 hours ?Heparin level and CBC daily while on heparin ? ? ?[employer], Pharm.D., BCPS ?Clinical Pharmacist ? ?**Pharmacist phone directory can be found on [email or web address] listed under [place]. ? ?04/18/2021 1:37 PM ? ?

## 2021-04-19 ENCOUNTER — Inpatient Hospital Stay (HOSPITAL_COMMUNITY): Payer: Commercial Managed Care - PPO

## 2021-04-19 LAB — CBC
HCT: 24.8 % — ABNORMAL LOW (ref 36.0–46.0)
Hemoglobin: 7.8 g/dL — ABNORMAL LOW (ref 12.0–15.0)
MCH: 28.4 pg (ref 26.0–34.0)
MCHC: 31.5 g/dL (ref 30.0–36.0)
MCV: 90.2 fL (ref 80.0–100.0)
Platelets: 107 10*3/uL — ABNORMAL LOW (ref 150–400)
RBC: 2.75 MIL/uL — ABNORMAL LOW (ref 3.87–5.11)
RDW: 15.4 % (ref 11.5–15.5)
WBC: 4.2 10*3/uL (ref 4.0–10.5)
nRBC: 0 % (ref 0.0–0.2)

## 2021-04-19 LAB — LIPID PANEL
Cholesterol: 159 mg/dL (ref 0–200)
HDL: 42 mg/dL (ref >40)
LDL Cholesterol: 104 mg/dL — ABNORMAL HIGH (ref 0–99)
Total CHOL/HDL Ratio: 3.8 RATIO
Triglycerides: 66 mg/dL (ref <150)
VLDL: 13 mg/dL (ref 0–40)

## 2021-04-19 LAB — BASIC METABOLIC PANEL
Anion gap: 14 (ref 5–15)
BUN: 95 mg/dL — ABNORMAL HIGH (ref 6–20)
CO2: 20 mmol/L — ABNORMAL LOW (ref 22–32)
Calcium: 8.7 mg/dL — ABNORMAL LOW (ref 8.9–10.3)
Chloride: 102 mmol/L (ref 98–111)
Creatinine, Ser: 10.07 mg/dL — ABNORMAL HIGH (ref 0.44–1.00)
GFR, Estimated: 4 mL/min — ABNORMAL LOW (ref >60)
Glucose, Bld: 166 mg/dL — ABNORMAL HIGH (ref 70–99)
Potassium: 4.3 mmol/L (ref 3.5–5.1)
Sodium: 136 mmol/L (ref 135–145)

## 2021-04-19 LAB — GLUCOSE, CAPILLARY
Glucose-Capillary: 139 mg/dL — ABNORMAL HIGH (ref 70–99)
Glucose-Capillary: 162 mg/dL — ABNORMAL HIGH (ref 70–99)
Glucose-Capillary: 176 mg/dL — ABNORMAL HIGH (ref 70–99)
Glucose-Capillary: 155 mg/dL — ABNORMAL HIGH (ref 70–99)

## 2021-04-19 LAB — HEPARIN LEVEL (UNFRACTIONATED)
Heparin Unfractionated: 0.14 IU/mL — ABNORMAL LOW (ref 0.30–0.70)
Heparin Unfractionated: 0.23 IU/mL — ABNORMAL LOW (ref 0.30–0.70)
Heparin Unfractionated: 0.28 IU/mL — ABNORMAL LOW (ref 0.30–0.70)

## 2021-04-19 LAB — TSH: TSH: 5.312 u[IU]/mL — ABNORMAL HIGH (ref 0.350–4.500)

## 2021-04-19 MED ORDER — DILTIAZEM HCL ER COATED BEADS 120 MG PO CP24
120.0000 mg | ORAL_CAPSULE | Freq: Every day | ORAL | Status: DC
  Administered 2021-04-20: 120 mg via ORAL
  Filled 2021-04-19: qty 1

## 2021-04-19 MED ORDER — REGADENOSON 0.4 MG/5ML IV SOLN
INTRAVENOUS | Status: AC
  Administered 2021-04-19: 0.4 mg via INTRAVENOUS
  Filled 2021-04-19: qty 5

## 2021-04-19 MED ORDER — TECHNETIUM TC 99M TETROFOSMIN IV KIT
28.2000 | PACK | Freq: Once | INTRAVENOUS | Status: AC | PRN
  Administered 2021-04-19: 28.2 via INTRAVENOUS

## 2021-04-19 MED ORDER — REGADENOSON 0.4 MG/5ML IV SOLN
0.4000 mg | Freq: Once | INTRAVENOUS | Status: AC
  Filled 2021-04-19: qty 5

## 2021-04-19 MED ORDER — DILTIAZEM HCL 30 MG PO TABS
30.0000 mg | ORAL_TABLET | Freq: Three times a day (TID) | ORAL | Status: AC
  Administered 2021-04-19: 30 mg via ORAL
  Filled 2021-04-19: qty 1

## 2021-04-19 MED ORDER — TECHNETIUM TC 99M TETROFOSMIN IV KIT
10.2500 | PACK | Freq: Once | INTRAVENOUS | Status: AC | PRN
  Administered 2021-04-19: 10.25 via INTRAVENOUS
  Filled 2021-04-19: qty 11

## 2021-04-19 MED ORDER — ROSUVASTATIN CALCIUM 5 MG PO TABS
10.0000 mg | ORAL_TABLET | Freq: Every day | ORAL | Status: DC
  Administered 2021-04-19: 10 mg via ORAL
  Filled 2021-04-19: qty 2

## 2021-04-19 MED ORDER — LOPERAMIDE HCL 2 MG PO CAPS
2.0000 mg | ORAL_CAPSULE | Freq: Once | ORAL | Status: AC
  Administered 2021-04-19: 2 mg via ORAL
  Filled 2021-04-19: qty 1

## 2021-04-19 MED ORDER — HYDROXYZINE HCL 10 MG PO TABS: 10.0000 mg | ORAL_TABLET | Freq: Three times a day (TID) | ORAL | Status: DC | PRN

## 2021-04-19 NOTE — Progress Notes (Signed)
ANTICOAGULATION CONSULT NOTE ? ?Pharmacy Consult for Heparin ?Indication: atrial fibrillation ? ?Allergies  ?Allergen Reactions  ? Bactrim Nausea And Vomiting  ? Byetta 10 Mcg Pen [Exenatide] Nausea And Vomiting  ? Hctz [Hydrochlorothiazide] Other (See Comments) and Itching  ?  dizziness ?dizziness  ? Spironolactone Other (See Comments)  ?  Dizziness   ? Sulfa Antibiotics Nausea And Vomiting  ? Sulfacetamide Sodium Nausea And Vomiting  ? Sulfamethoxazole-Trimethoprim Nausea And Vomiting  ? ? ?Patient Measurements: ?Height: 5\' 6"  (167.6 cm) ?Weight: 71.4 kg (157 lb 6.5 oz) ?IBW/kg (Calculated) : 59.3 ?Heparin Dosing Weight: 69.6 kg ? ? ?Vital Signs: ?Temp: 98 ?F (36.7 ?C) (03/25 0000) ?Temp Source: Oral (03/25 0000) ?BP: 141/66 (03/25 0000) ?Pulse Rate: 74 (03/25 0000) ? ?Labs: ?Recent Labs  ?  04/17/21 ?0450 04/18/21 ?0631 04/18/21 ?1125 04/18/21 ?1353 04/19/21 ?0149  ?HGB 7.9* 7.8*  --   --  7.8*  ?HCT 25.6* 25.0*  --   --  24.8*  ?PLT 144* 117*  --   --  107*  ?HEPARINUNFRC  --   --   --   --  0.14*  ?CREATININE 13.05* 11.66*  --   --  10.07*  ?TROPONINIHS  --   --  208* 223*  --   ? ? ? ?Estimated Creatinine Clearance: 6.2 mL/min (A) (by C-G formula based on SCr of 10.07 mg/dL (H)). ? ? ?Medical History: ?Past Medical History:  ?Diagnosis Date  ? Acute cystitis   ? Anxiety state, unspecified   ? Background diabetic retinopathy(362.01)   ? Bulimia   ? Calculus of kidney   ? Cellulitis and abscess of foot 01/24/2015  ? left foot  ? Chronic inflammatory demyelinating polyneuritis (HCC)   ? Chronic kidney disease, stage IV (severe) ([place])   ? Complication of anesthesia   ? slow to wake up, had lost a lot of blood - 2010  ? Diarrhea   ? symptomatic  ? Disorders of magnesium metabolism   ? DM2 (diabetes mellitus, type 2) ([place])   ? Dysthymic disorder   ? Dysuria   ? Edema   ? moderate  ? Encounter for long-term (current) use of other medications   ? Esophageal reflux   ? Essential hypertension, benign   ? Fever 10/2018   ? Heart murmur   ? been told very slight, never given her any problems  ? History of blood transfusion X 6-7; 2010 - present (03/29/2014)  ? "related to OR; kidney issues"  ? HLD (hyperlipidemia)   ? HDL goal >50, LDL goal <100  ? Iron deficiency anemia   ? "suppose to get procrit injections q 3 wks; usually don't" do it" (03/29/2014)  ? Irritable bowel syndrome   ? Left heart failure ([place])   ? Muscle weakness (generalized)   ? Neuralgia, neuritis, and radiculitis, unspecified   ? Palpitations   ? Peripheral autonomic neuropathy in disorders classified elsewhere(337.1)   ? Pneumonia 2010  ? Primary pulmonary hypertension ([place])   ? not seen at CATH  (17mm Hg), pt not aware of this  ? RLS (restless legs syndrome)   ? Type II or unspecified type diabetes mellitus with other specified manifestations, not stated as uncontrolled   ? [facility] in [facility]: Normal coronary arteries  ? Unspecified essential hypertension   ? Renal Dopplers 12/23/09 at [facility]: No significant renal artery stenosis bilaterally  ? ? ?Medications:  ?Scheduled:  ? Chlorhexidine Gluconate Cloth  6  each Topical Q0600  ? Chlorhexidine Gluconate Cloth  6 each Topical Q0600  ? diltiazem  30 mg Oral Q8H  ? FLUoxetine  40 mg Oral q morning  ? insulin aspart  0-5 Units Subcutaneous QHS  ? insulin aspart  0-9 Units Subcutaneous TID WC  ? leptospermum manuka honey  1 application. Topical Daily  ? mupirocin ointment  1 application. Nasal BID  ? rOPINIRole  1 mg Oral QHS  ? sevelamer carbonate  800 mg Oral TID WC  ? zolpidem  5 mg Oral QHS  ? ?Infusions:  ? diltiazem (CARDIZEM) infusion    ? ferric gluconate (FERRLECIT) IVPB    ? heparin 1,000 Units/hr (04/18/21 1643)  ? ? ?Assessment: ?56 yo F admitted with abd pain and LE edema.  Pt has hx of CKD V now with transition to dialysis.  Patient was at HD session today and went into Afib with RVR.  Cardiology has been consulted.  Pharmacy has been  asked to start heparin infusion.  Of note, pt last received SQ heparin 3/24 at 0600. ? ?3/25 AM update:  ?Heparin level low ?Hgb low but stable ? ?Goal of Therapy:  ?Heparin level 0.3-0.7 units/ml ?Monitor platelets by anticoagulation protocol: Yes ?  ?Plan:  ?Inc heparin to 1150 units/hr ?1100 heparin level ? ?Narda Bonds, PharmD, BCPS ?Clinical Pharmacist ?Phone: [phone] ? ? ?

## 2021-04-19 NOTE — Progress Notes (Signed)
ANTICOAGULATION CONSULT NOTE - Follow Up Consult ? ?Pharmacy Consult for IV heparin ?Indication: atrial fibrillation ? ?Allergies  ?Allergen Reactions  ? Bactrim Nausea And Vomiting  ? Byetta 10 Mcg Pen [Exenatide] Nausea And Vomiting  ? Hctz [Hydrochlorothiazide] Other (See Comments) and Itching  ?  dizziness ?dizziness  ? Spironolactone Other (See Comments)  ?  Dizziness   ? Sulfa Antibiotics Nausea And Vomiting  ? Sulfacetamide Sodium Nausea And Vomiting  ? Sulfamethoxazole-Trimethoprim Nausea And Vomiting  ? ? ?Patient Measurements: ?Height: 5\' 6"  (167.6 cm) ?Weight: 72.1 kg (158 lb 15.2 oz) ?IBW/kg (Calculated) : 59.3 ?Heparin Dosing Weight: 71.4 kg ? ?Vital Signs: ?Temp: 98.7 ?F (37.1 ?C) (03/25 2002) ?Temp Source: Oral (03/25 2002) ?BP: 171/53 (03/25 2002) ?Pulse Rate: 75 (03/25 2002) ? ?Labs: ?Recent Labs  ?  04/17/21 ?0450 04/18/21 ?0631 04/18/21 ?1125 04/18/21 ?1353 04/19/21 ?0149 04/19/21 ?1254 04/19/21 ?2134  ?HGB 7.9* 7.8*  --   --  7.8*  --   --   ?HCT 25.6* 25.0*  --   --  24.8*  --   --   ?PLT 144* 117*  --   --  107*  --   --   ?HEPARINUNFRC  --   --   --   --  0.14* 0.23* 0.28*  ?CREATININE 13.05* 11.66*  --   --  10.07*  --   --   ?TROPONINIHS  --   --  208* 223*  --   --   --   ? ? ? ?Estimated Creatinine Clearance: 6.3 mL/min (A) (by C-G formula based on SCr of 10.07 mg/dL (H)). ? ?Assessment: ?56 yo F admitted with abd pain and LE edema. Pt has hx of CKD V now with transition to dialysis. Patient was at HD session and went into Afib with RVR. Cardiology has been consulted. Pharmacy has been asked to start heparin infusion. Of note, pt last received SQ heparin 3/24 at 0600. ? ?Heparin level 0.28 this PM ? ?Goal of Therapy:  ?Heparin level 0.3-0.7 units/ml ?Monitor platelets by anticoagulation protocol: Yes ?  ?Plan:  ?Increase to heparin 1400 units/hr ?Daily heparin level, CBC ? ?Thank you ?Anette Guarneri, PharmD ?04/19/2021  10:10 PM ? ?Please check [email or web address] for unit-specific pharmacy phone  numbers. ? ? ? ?

## 2021-04-19 NOTE — Progress Notes (Signed)
ANTICOAGULATION CONSULT NOTE - Follow Up Consult ? ?Pharmacy Consult for IV heparin ?Indication: atrial fibrillation ? ?Allergies  ?Allergen Reactions  ? Bactrim Nausea And Vomiting  ? Byetta 10 Mcg Pen [Exenatide] Nausea And Vomiting  ? Hctz [Hydrochlorothiazide] Other (See Comments) and Itching  ?  dizziness ?dizziness  ? Spironolactone Other (See Comments)  ?  Dizziness   ? Sulfa Antibiotics Nausea And Vomiting  ? Sulfacetamide Sodium Nausea And Vomiting  ? Sulfamethoxazole-Trimethoprim Nausea And Vomiting  ? ? ?Patient Measurements: ?Height: 5\' 6"  (167.6 cm) ?Weight: 72.1 kg (158 lb 15.2 oz) ?IBW/kg (Calculated) : 59.3 ?Heparin Dosing Weight: 71.4 kg ? ?Vital Signs: ?Temp: 98.4 ?F (36.9 ?C) (03/25 1024) ?Temp Source: Oral (03/25 1308) ?BP: 158/71 (03/25 1308) ?Pulse Rate: 75 (03/25 1308) ? ?Labs: ?Recent Labs  ?  04/17/21 ?0450 04/18/21 ?0631 04/18/21 ?1125 04/18/21 ?1353 04/19/21 ?0149 04/19/21 ?1254  ?HGB 7.9* 7.8*  --   --  7.8*  --   ?HCT 25.6* 25.0*  --   --  24.8*  --   ?PLT 144* 117*  --   --  107*  --   ?HEPARINUNFRC  --   --   --   --  0.14* 0.23*  ?CREATININE 13.05* 11.66*  --   --  10.07*  --   ?TROPONINIHS  --   --  208* 223*  --   --   ? ? ?Estimated Creatinine Clearance: 6.3 mL/min (A) (by C-G formula based on SCr of 10.07 mg/dL (H)). ? ?Assessment: ?56 yo F admitted with abd pain and LE edema. Pt has hx of CKD V now with transition to dialysis. Patient was at HD session and went into Afib with RVR. Cardiology has been consulted. Pharmacy has been asked to start heparin infusion. Of note, pt last received SQ heparin 3/24 at 0600. ? ?Most recent heparin level subtherapeutic at 0.23. Per RN, IV with heparin in it came out around 1440. Patient was bleeding, which has now resolved, and heparin was moved to the other IV site. With low heparin level and bleeding resolved, will plan to increase rate.  ? ?Goal of Therapy:  ?Heparin level 0.3-0.7 units/ml ?Monitor platelets by anticoagulation protocol:  Yes ?  ?Plan:  ?Increase to heparin 1300 units/hr ?6 hour heparin level and daily heparin level ?Monitor for s/sx of bleeding ? ?Thank you for including pharmacy in the care of this patient. ? ?Zenaida Deed, PharmD ?PGY1 Acute Care Pharmacy Resident  ?Phone: [phone] ?04/19/2021  3:39 PM ? ?Please check [email or web address] for unit-specific pharmacy phone numbers. ? ? ? ?

## 2021-04-19 NOTE — Progress Notes (Signed)
Progress Note ? ?Patient Name: Holly [name] ?Date of Encounter: 04/19/2021 ? ?Attending physician: Geradine Girt, DO ? ?Subjective: ?Holly [name] is a 56 y.o. female who was seen and examined at bedside  ?Denies chest pain, shortness of breath, lightheadedness, dizziness, orthopnea, paroxysmal nocturnal dyspnea ?Hemodialysis earlier this morning ?Seen and evaluated before her stress test and nuclear med ?Case discussed and reviewed with her nurse. ? ?Objective: ?Vital Signs in the last 24 hours: ?Temp:  [97.8 ?F (36.6 ?C)-98.4 ?F (36.9 ?C)] 98.4 ?F (36.9 ?C) (03/25 1024) ?Pulse Rate:  [73-76] 75 (03/25 1308) ?Resp:  [14-21] 17 (03/25 1308) ?BP: (132-207)/(50-83) 158/71 (03/25 1308) ?SpO2:  [92 %-97 %] 92 % (03/25 1308) ?Weight:  [71 kg-72.1 kg] 72.1 kg (03/25 1018) ? ?Intake/Output: ? ?Intake/Output Summary (Last 24 hours) at 04/19/2021 1824 ?Last data filed at 04/19/2021 1811 ?Gross per 24 hour  ?Intake 864.62 ml  ?Output 500 ml  ?Net 364.62 ml  ?  ?Net IO Since Admission: -2,764.22 mL [04/19/21 1824] ? ?Weights:  ?Filed Weights  ? 04/19/21 0523 04/19/21 0659 04/19/21 1018  ?Weight: 71.6 kg 71 kg 72.1 kg  ? ? ?Telemetry: Normal sinus rhythm.  Converted to NSR yesterday ? ?Physical examination: ?PHYSICAL EXAM: ? ?  04/19/2021  ?  1:08 PM 04/19/2021  ?  1:06 PM 04/19/2021  ? 12:09 PM  ?Vitals with BMI  ?Systolic 188 416 606  ?Diastolic 71 71 79  ?Pulse 75    ? ? ?CONSTITUTIONAL: Appears older than stated age, hemodynamically stable, no acute distress.   ?SKIN: Skin is warm and dry. No rash noted. No cyanosis. No pallor. No jaundice ?HEAD: Normocephalic and atraumatic.  ?EYES: No scleral icterus ?MOUTH/THROAT: Moist oral membranes.  ?NECK: No JVD present. No thyromegaly noted. No carotid bruits  ?CHEST Normal respiratory effort. No intercostal retractions  ?LUNGS: Clear to auscultation bilaterally.  No stridor. No wheezes. No rales.  ?CARDIOVASCULAR: Tachycardic, irregularly irregular, variable T0-Z6, holosystolic  murmur heard over the left lower sternal border, no rubs or gallops appreciated.   ?ABDOMINAL: Soft, nontender, nondistended, positive bowel sounds in all 4 quadrants, no apparent ascites.  ?EXTREMITIES: Hemodialysis access on the right upper extremity with good thrill.  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  Right first, second, third phalanges noted superficial eschar/ulcer, nonhealing non-malodorous.  2+ left DP pulse, 1+ right DP pulse, difficult to appreciate bilateral posterior tibial pulses.  No pitting edema. ?HEMATOLOGIC: No significant bruising ?NEUROLOGIC: Oriented to person, place, and time. Nonfocal. Normal muscle tone.  ?PSYCHIATRIC: Normal mood and affect. Normal behavior. Cooperative ? ?Lab Results: ?Hematology ?Recent Labs  ?Lab 04/17/21 ?0450 04/18/21 ?0631 04/19/21 ?0149  ?WBC 5.7 4.8 4.2  ?RBC 2.85* 2.77* 2.75*  ?HGB 7.9* 7.8* 7.8*  ?HCT 25.6* 25.0* 24.8*  ?MCV 89.8 90.3 90.2  ?MCH 27.7 28.2 28.4  ?MCHC 30.9 31.2 31.5  ?RDW 15.1 15.4 15.4  ?PLT 144* 117* 107*  ? ? ?Chemistry ?Recent Labs  ?Lab 04/15/21 ?1607 04/16/21 ?0615 04/17/21 ?0450 04/18/21 ?0631 04/19/21 ?0149  ?NA 139   < > 137 138 136  ?K 4.8   < > 4.9 4.7 4.3  ?CL 102   < > 105 104 102  ?CO2 18*   < > 17* 17* 20*  ?GLUCOSE 234*   < > 113* 131* 166*  ?BUN 135*   < > 136* 115* 95*  ?CREATININE 12.15*   < > 13.05* 11.66* 10.07*  ?CALCIUM 9.0   < > 8.6* 8.7* 8.7*  ?PROT 5.9*  --   --   --   --   ?  ALBUMIN 3.3*  --  2.8* 2.8*  --   ?AST 15  --   --   --   --   ?ALT 17  --   --   --   --   ?ALKPHOS 92  --   --   --   --   ?BILITOT 0.6  --   --   --   --   ?GFRNONAA 3*   < > 3* 3* 4*  ?ANIONGAP 19*   < > 15 17* 14  ? < > = values in this interval not displayed.  ?  ? ?Cardiac Enzymes: ?Cardiac Panel (last 3 results) ?Recent Labs  ?  04/18/21 ?1125 04/18/21 ?1353  ?TROPONINIHS 208* 223*  ? ? ?BNP (last 3 results) ?No results for input(s): BNP in the last 8760 hours. ? ?ProBNP (last 3 results) ?No results for input(s): PROBNP  in the last 8760 hours. ? ? ?DDimer No results for input(s): DDIMER in the last 168 hours.  ? ?Hemoglobin A1c:  ?Lab Results  ?Component Value Date  ? HGBA1C 6.3 (H) 04/16/2021  ? MPG 134.11 04/16/2021  ? ? ?TSH  ?Recent Labs  ?  04/19/21 ?0149  ?TSH 5.312*  ? ? ?Lipid Panel  ?   ?Component Value Date/Time  ? CHOL 159 04/19/2021 0149  ? TRIG 66 04/19/2021 0149  ? HDL 42 04/19/2021 0149  ? CHOLHDL 3.8 04/19/2021 0149  ? VLDL 13 04/19/2021 0149  ? [place] 104 (H) 04/19/2021 0149  ? LDLDIRECT  03/28/2008 0520  ?  96 ?(NOTE) ATP III Classification (LDL):      < 100        mg/dL         Optimal     100 - 129     mg/dL         Near or Above Optimal     130 - 159     mg/dL         Borderline High     160 - 189     mg/dL         High      > 190        mg/dL         Very  ?High   ? ? ?Imaging: ?NM Myocar Multi W/Spect W/Wall Motion / EF ? ?Result Date: 04/19/2021 ?CLINICAL DATA:  Chest pain. Shortness of breath. Coronary artery disease with previous stent placement. EXAM: MYOCARDIAL IMAGING WITH SPECT (REST AND PHARMACOLOGIC-STRESS) GATED LEFT VENTRICULAR WALL MOTION STUDY LEFT VENTRICULAR EJECTION FRACTION TECHNIQUE: Standard myocardial SPECT imaging was performed after resting intravenous injection of 10.3 mCi Tc-100m tetrofosmin. Subsequently, intravenous infusion of Lexiscan was performed under the supervision of the Cardiology staff. At peak effect of the drug, 28.2 mCi Tc-43m tetrofosmin was injected intravenously and standard myocardial SPECT imaging was performed. Quantitative gated imaging was also performed to evaluate left ventricular wall motion, and estimate left ventricular ejection fraction. COMPARISON:  None. FINDINGS: Perfusion: No decreased activity in the left ventricle on stress imaging to suggest reversible ischemia or infarction. Wall Motion: Normal left ventricular wall motion. No left ventricular dilation. Left Ventricular Ejection Fraction: 62 % End diastolic volume 518 ml End systolic volume 47 ml  IMPRESSION: 1. No reversible ischemia or infarction. 2. Normal left ventricular wall motion. 3. Left ventricular ejection fraction 62% 4. Non invasive risk stratification*: Low *2012 Appropriate Use Criteria for Coronary Revascularization Focused Update: J Am Coll Cardiol. 8416;60(6):301-601. http://content.airportbarriers.com.aspx?articleid=1201161 Electronically Signed  By: Marlaine Hind M.D.   On: 04/19/2021 13:23  ? ?ECHOCARDIOGRAM COMPLETE ? ?Result Date: 04/18/2021 ?   ECHOCARDIOGRAM REPORT   Patient Name:   Holly [name] Date of Exam: 04/18/2021 Medical Rec #:  [id number]       Height:       66.0 in Accession #:    [id number]      Weight:       153.4 lb Date of Birth:  05-02-1965        BSA:          1.787 m? Patient Age:    42 years        BP:           90/54 mmHg Patient Gender: F               HR:           126 bpm. Exam Location:  Inpatient Procedure: 2D Echo Indications:    Atrial fibrillation  History:        Patient has no prior history of Echocardiogram examinations.                 Risk Factors:Dyslipidemia, Diabetes and Hypertension.  Sonographer:    Arlyss Gandy Referring Phys: [place]  1. Left ventricular ejection fraction, by estimation, is 60 to 65%. The left ventricle has normal function. The left ventricle has no regional wall motion abnormalities. There is moderate left ventricular hypertrophy. Left ventricular diastolic function  could not be evaluated.  2. Right ventricular systolic function is normal. The right ventricular size is mildly enlarged. There is moderately elevated pulmonary artery systolic pressure. The estimated right ventricular systolic pressure is 91.6 mmHg.  3. Left atrial size was severely dilated.  4. Right atrial size was severely dilated.  5. Moderate pericardial effusion. The pericardial effusion is circumferential.  6. The mitral valve is abnormal. Mild mitral valve regurgitation. Mild mitral stenosis. The mean mitral valve gradient is 5.5  mmHg with average heart rate of 115 bpm. Severe mitral annular calcification.  7. Tricuspid valve regurgitation is moderate to severe.  8. The aortic valve is tricuspid. There is mild calcification of the a

## 2021-04-19 NOTE — Progress Notes (Signed)
?[facility] ?Progress Note  ? ?Subjective:  Seen on HD today - 1L UFG and tolerating. Remains NSR on monitor, no CP/dyspnea today. A-fib RVR during HD yesterday - cardiology consulted, for nuclear stress test today. ? ?Objective ?Vitals:  ? 04/19/21 1200 04/19/21 1205 04/19/21 1207 04/19/21 1209  ?BP: (!) 207/83 (!) 160/77 (!) 163/77 (!) 153/79  ?Pulse:      ?Resp:      ?Temp:      ?TempSrc:      ?SpO2:      ?Weight:      ?Height:      ? ?Physical Exam ?General: Well appearing woman, NAD ?Heart: RRR; no murmur/rub ?Lungs: CTA anteriorly ?Abdomen: soft ?Extremities: No LE edema ?Dialysis Access:  RUE AVF + thrill ? ?Additional Objective ?Labs: ?Basic Metabolic Panel: ?Recent Labs  ?Lab 04/17/21 ?0450 04/18/21 ?0631 04/19/21 ?0149  ?NA 137 138 136  ?K 4.9 4.7 4.3  ?CL 105 104 102  ?CO2 17* 17* 20*  ?GLUCOSE 113* 131* 166*  ?BUN 136* 115* 95*  ?CREATININE 13.05* 11.66* 10.07*  ?CALCIUM 8.6* 8.7* 8.7*  ?PHOS 10.8* 9.2*  --   ? ?Liver Function Tests: ?Recent Labs  ?Lab 04/15/21 ?1607 04/17/21 ?0450 04/18/21 ?0631  ?AST 15  --   --   ?ALT 17  --   --   ?ALKPHOS 92  --   --   ?BILITOT 0.6  --   --   ?PROT 5.9*  --   --   ?ALBUMIN 3.3* 2.8* 2.8*  ? ?Recent Labs  ?Lab 04/15/21 ?1607  ?LIPASE 27  ? ?CBC: ?Recent Labs  ?Lab 04/15/21 ?1607 04/16/21 ?0615 04/17/21 ?5643 04/17/21 ?3295 04/18/21 ?1884 04/19/21 ?0149  ?WBC 6.5 4.9 6.0 5.7 4.8 4.2  ?NEUTROABS 5.0  --   --   --   --   --   ?HGB 8.5* 8.2* 8.1* 7.9* 7.8* 7.8*  ?HCT 28.3* 26.8* 26.2* 25.6* 25.0* 24.8*  ?MCV 91.9 90.2 90.3 89.8 90.3 90.2  ?PLT 163 150 135* 144* 117* 107*  ? ?Studies/Results: ?ECHOCARDIOGRAM COMPLETE ? ?Result Date: 04/18/2021 ?   ECHOCARDIOGRAM REPORT   Patient Name:   Holly [name] Date of Exam: 04/18/2021 Medical Rec #:  [id number]       Height:       66.0 in Accession #:    [id number]      Weight:       153.4 lb Date of Birth:  1965-03-24        BSA:          1.787 m? Patient Age:    56 years        BP:           90/54 mmHg Patient  Gender: F               HR:           126 bpm. Exam Location:  Inpatient Procedure: 2D Echo Indications:    Atrial fibrillation  History:        Patient has no prior history of Echocardiogram examinations.                 Risk Factors:Dyslipidemia, Diabetes and Hypertension.  Sonographer:    Arlyss Gandy Referring Phys: [place]  1. Left ventricular ejection fraction, by estimation, is 60 to 65%. The left ventricle has normal function. The left ventricle has no regional wall motion abnormalities. There is moderate left ventricular hypertrophy. Left ventricular diastolic function  could not be evaluated.  2. Right ventricular systolic function is normal. The right ventricular size is mildly enlarged. There is moderately elevated pulmonary artery systolic pressure. The estimated right ventricular systolic pressure is 33.8 mmHg.  3. Left atrial size was severely dilated.  4. Right atrial size was severely dilated.  5. Moderate pericardial effusion. The pericardial effusion is circumferential.  6. The mitral valve is abnormal. Mild mitral valve regurgitation. Mild mitral stenosis. The mean mitral valve gradient is 5.5 mmHg with average heart rate of 115 bpm. Severe mitral annular calcification.  7. Tricuspid valve regurgitation is moderate to severe.  8. The aortic valve is tricuspid. There is mild calcification of the aortic valve. Aortic valve regurgitation is not visualized. Aortic valve sclerosis/calcification is present, without any evidence of aortic stenosis.  9. The inferior vena cava is dilated in size with <50% respiratory variability, suggesting right atrial pressure of 15 mmHg. Comparison(s): No prior Echocardiogram. Conclusion(s)/Recommendation(s): Patient in afib RVR with rates up to the 160s during the study. Small to moderate pericardial effusion without RA/RV collapse, but this may be offset by pulmonary hypertension. TV inflow velocities fluctuate more with heart rate than  respiration. Findings communicated with Dr. Eliseo Squires. FINDINGS  Left Ventricle: Left ventricular ejection fraction, by estimation, is 60 to 65%. The left ventricle has normal function. The left ventricle has no regional wall motion abnormalities. The left ventricular internal cavity size was normal in size. There is  moderate left ventricular hypertrophy. Left ventricular diastolic function could not be evaluated due to atrial fibrillation. Left ventricular diastolic function could not be evaluated. Right Ventricle: The right ventricular size is mildly enlarged. Right vetricular wall thickness was not well visualized. Right ventricular systolic function is normal. There is moderately elevated pulmonary artery systolic pressure. The tricuspid regurgitant velocity is 3.15 m/s, and with an assumed right atrial pressure of 15 mmHg, the estimated right ventricular systolic pressure is 25.0 mmHg. Left Atrium: Left atrial size was severely dilated. Right Atrium: Right atrial size was severely dilated. Pericardium: There is variation in TV inflow velocities, but this appears to be more related to heart rate than respiration. No definitive echo evidence of tamponade, but would use clinical judgement. A moderately sized pericardial effusion is present. The pericardial effusion is circumferential. Mitral Valve: The mitral valve is abnormal. There is mild thickening of the mitral valve leaflet(s). There is mild calcification of the mitral valve leaflet(s). Severe mitral annular calcification. Mild mitral valve regurgitation. Mild mitral valve stenosis. MV peak gradient, 11.6 mmHg. The mean mitral valve gradient is 5.5 mmHg with average heart rate of 115 bpm. Tricuspid Valve: The tricuspid valve is grossly normal. Tricuspid valve regurgitation is moderate to severe. No evidence of tricuspid stenosis. Aortic Valve: The aortic valve is tricuspid. There is mild calcification of the aortic valve. Aortic valve regurgitation is not  visualized. Aortic valve sclerosis/calcification is present, without any evidence of aortic stenosis. Aortic valve mean gradient measures 4.0 mmHg. Aortic valve peak gradient measures 8.5 mmHg. Aortic valve area, by VTI measures 2.77 cm?. Pulmonic Valve: The pulmonic valve was not well visualized. Pulmonic valve regurgitation is trivial. No evidence of pulmonic stenosis. Aorta: The aortic root, ascending aorta, aortic arch and descending aorta are all structurally normal, with no evidence of dilitation or obstruction. Venous: The inferior vena cava is dilated in size with less than 50% respiratory variability, suggesting right atrial pressure of 15 mmHg. IAS/Shunts: The interatrial septum was not well visualized.  LEFT VENTRICLE PLAX 2D LVIDd:  4.00 cm   Diastology LVIDs:         3.00 cm   LV e' medial:  6.20 cm/s LV PW:         1.40 cm   LV e' lateral: 9.14 cm/s LV IVS:        1.40 cm LVOT diam:     2.00 cm LV SV:         71 LV SV Index:   40 LVOT Area:     3.14 cm?  RIGHT VENTRICLE             IVC RV Basal diam:  3.90 cm     IVC diam: 2.10 cm RV Mid diam:    2.90 cm RV S prime:     14.70 cm/s TAPSE (M-mode): 1.9 cm LEFT ATRIUM             Index        RIGHT ATRIUM           Index LA diam:        4.40 cm 2.46 cm/m?   RA Area:     24.30 cm? LA Vol (A2C):   95.3 ml 53.34 ml/m?  RA Volume:   76.10 ml  42.59 ml/m? LA Vol (A4C):   88.1 ml 49.31 ml/m? LA Biplane Vol: 93.2 ml 52.16 ml/m?  AORTIC VALVE                    PULMONIC VALVE AV Area (Vmax):    2.30 cm?     PV Vmax:       1.24 m/s AV Area (Vmean):   2.65 cm?     PV Peak grad:  6.2 mmHg AV Area (VTI):     2.77 cm? AV Vmax:           146.00 cm/s AV Vmean:          90.100 cm/s AV VTI:            0.256 m AV Peak Grad:      8.5 mmHg AV Mean Grad:      4.0 mmHg LVOT Vmax:         107.00 cm/s LVOT Vmean:        75.900 cm/s LVOT VTI:          0.226 m LVOT/AV VTI ratio: 0.88  AORTA Ao Root diam: 2.80 cm Ao Asc diam:  3.00 cm MITRAL VALVE                TRICUSPID  VALVE MV Area (PHT): 2.37 cm?     TR Peak grad:   39.7 mmHg MV Area VTI:   1.26 cm?     TR Vmax:        315.00 cm/s MV Peak grad:  11.6 mmHg MV Mean grad:  5.5 mmHg     SHUNTS MV Vmax:       1.70 m/s     Systemic VTI:

## 2021-04-19 NOTE — Progress Notes (Signed)
Pt coming from the bathroom and IV that had Heparin came out. Patient bleeding profusely, pressure held to stop the bleeding. Heparin moved to patient's other IV. New dressing placed on left upper arm IV. Message sent to pharmacist to make her aware.  ?

## 2021-04-19 NOTE — Progress Notes (Signed)
removed 529mls net fluid , nephrology at bedside changed order.  pre bp 154/76 post bp 133/66  no problems today NSR throughout treatment.  pre weight 71.0kg post weight 71.00kg bed scales.  2 bandages to rua avf no bleeding dressing cdi. ?

## 2021-04-19 NOTE — Progress Notes (Signed)
?PROGRESS NOTE ? ? ? ?Holly [name]  HWE:[id number] DOB: 1965/07/30 DOA: 04/15/2021 ? ?PCP: [facility] [place]  ? 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  Patient also reported decreased urine output .  She reports that her nephrology team is planning on starting outpatient dialysis soon.  Labs in the ED include serum creatinine 12.15(rising from 4> 6> 8> in last 4 months) bladder scan showed more than 700 mL.  Foley catheter was inserted for urinary retention. Nephrologist was consulted, patient initiated on hemodialysis 3/23.  Went into a fib with RVR 3/24 during HD.   ? ?Assessment & Plan: ?  ?Principal Problem: ?  Acute renal failure superimposed on stage 4 chronic kidney disease ([place]) ?Active Problems: ?  Acute urinary retention ?  Type 2 diabetes mellitus with chronic kidney disease, with long-term current use of insulin ([place]) ?  Hypertension associated with diabetes ([place]) ?  Anemia in chronic kidney disease ?  Depression ?  Atrial fibrillation, new onset ([place]) ? ? ? ?AKI on CKD stage IV: ?Patient with progressively worsening renal functions  over the last 4 months with labs showing BUN 135, Creatinine 12.5, GFR 3 on admission. ?Patient has AV fistula placed in right upper extremity with plans for initiating outpatient dialysis soon. ?Nephrology is consulted by ED, patient initiated on hemodialysis.  ?-HD 3/25 ? ?A fib with RVR ?-new ?-cards consult appreciated- stress test pending ?-cardizem PO as converted prior to getting gtt ?-heparin gtt ?-echo: was in a fib during exam.  Small to moderate pericardial effusion without  ?RA/RV collapse, but this may be offset by pulmonary hypertension. TV  ?inflow  velocities fluctuate more with  ?heart rat 5.3e than respiration. ?-TSH: slightly elevated, check free t4 ? ?Acute urinary retention: ?Bladder scan more than 700 mm on arrival. ?Likely contributing some degree of progressive renal dysfunction. ?D/c foley catheter- voiding trial ? ?Type 2 diabetes with CKD ?Hold p.o. diabetic medications.  ?Continue regular insulin sliding scale.  ? ?Essential hypertension: ?-d/c BP meds like labetalol to allow for cardizem ? ?Anemia of chronic disease: ?Hemoglobin relatively stable. ? ?Depression: ?Continue Prozac ? ?Right foot chronic wound: ?Podiatry consulted.  X-ray no evidence of osteomyelitis. ?No signs of any infection.  No need for antibiotics. ?No indication for any surgical intervention. ?-patient declined ABIs ?  ? ?DVT prophylaxis:  Heparin gtt ?Code Status:Full code ?Family Communication: No family at bed side. ?Disposition Plan:  ? ?Status is: Inpatient ?Remains inpatient appropriate because: need HD ? ?Consultants:  ?Nephrology ?Podiatry ?cards ? ? ? ? ?Subjective: ?hungry ? ?Objective: ?Vitals:  ? 04/19/21 0659 04/19/21 0717 04/19/21 0730 04/19/21 0800  ?BP: (!) 154/76 (!) 153/70 (!) 147/69 (!) 153/74  ?Pulse: 75 75 75 74  ?Resp: 17 17 17    ?Temp: 98.3 ?F (36.8 ?C)     ?TempSrc: Oral     ?SpO2:      ?Weight: 71 kg     ?Height:      ? ? ?Intake/Output Summary (Last 24 hours) at 04/19/2021 0836 ?Last data filed at 04/19/2021 [phone] ?Gross per 24 hour  ?Intake 381.25 ml  ?Output -834 ml  ?Net 1215.25 ml  ? ?Filed Weights  ? 04/18/21 1427 04/19/21 0523 04/19/21 0659  ?Weight: 71.4 kg 71.6 kg  71 kg  ? ? ?Examination: ? ? ?General: Appearance:     ?Overweight female in no acute distress  ?   ?Lungs:     respirations unlabored  ?Heart:    Normal heart rate. Sinus on tele ?  ?   ?Neurologic:   Awake, alert, oriented x 3. No apparent focal neurological           defect.   ?  ?  ? ? ? ?Data Reviewed: I have personally reviewed following labs and imaging studies ? ?CBC: ?Recent  Labs  ?Lab 04/15/21 ?1607 04/16/21 ?0615 04/17/21 ?3149 04/17/21 ?7026 04/18/21 ?3785 04/19/21 ?0149  ?WBC 6.5 4.9 6.0 5.7 4.8 4.2  ?NEUTROABS 5.0  --   --   --   --   --   ?HGB 8.5* 8.2* 8.1* 7.9* 7.8* 7.8*  ?HCT 28.3* 26.8* 26.2* 25.6* 25.0* 24.8*  ?MCV 91.9 90.2 90.3 89.8 90.3 90.2  ?PLT 163 150 135* 144* 117* 107*  ? ?Basic Metabolic Panel: ?Recent Labs  ?Lab 04/16/21 ?0615 04/16/21 ?1130 04/17/21 ?0239 04/17/21 ?8850 04/18/21 ?2774 04/19/21 ?0149  ?NA 136  --  137 137 138 136  ?K 4.6  --  5.0 4.9 4.7 4.3  ?CL 102  --  106 105 104 102  ?CO2 18*  --  16* 17* 17* 20*  ?GLUCOSE 88  --  133* 113* 131* 166*  ?BUN 136*  --  137* 136* 115* 95*  ?CREATININE 12.35*  --  12.97* 13.05* 11.66* 10.07*  ?CALCIUM 9.0 9.0 8.6* 8.6* 8.7* 8.7*  ?PHOS  --   --   --  10.8* 9.2*  --   ? ?GFR: ?Estimated Creatinine Clearance: 5.8 mL/min (A) (by C-G formula based on SCr of 10.07 mg/dL (H)). ?Liver Function Tests: ?Recent Labs  ?Lab 04/15/21 ?1607 04/17/21 ?0450 04/18/21 ?0631  ?AST 15  --   --   ?ALT 17  --   --   ?ALKPHOS 92  --   --   ?BILITOT 0.6  --   --   ?PROT 5.9*  --   --   ?ALBUMIN 3.3* 2.8* 2.8*  ? ?Recent Labs  ?Lab 04/15/21 ?1607  ?LIPASE 27  ? ?No results for input(s): AMMONIA in the last 168 hours. ?Coagulation Profile: ?No results for input(s): INR, PROTIME in the last 168 hours. ?Cardiac Enzymes: ?No results for input(s): CKTOTAL, CKMB, CKMBINDEX, TROPONINI in the last 168 hours. ?BNP (last 3 results) ?No results for input(s): PROBNP in the last 8760 hours. ?HbA1C: ?No results for input(s): HGBA1C in the last 72 hours. ? ?CBG: ?Recent Labs  ?Lab 04/17/21 ?2050 04/18/21 ?1127 04/18/21 ?1718 04/18/21 ?2107 04/19/21 ?1287  ?GLUCAP 169* 142* 245* 231* 139*  ? ?Lipid Profile: ?Recent Labs  ?  04/19/21 ?0149  ?CHOL 159  ?HDL 42  ?LDLCALC 104*  ?TRIG 66  ?CHOLHDL 3.8  ? ?Thyroid Function Tests: ?Recent Labs  ?  04/19/21 ?0149  ?TSH 5.312*  ? ?Anemia Panel: ?No results for input(s): VITAMINB12, FOLATE, FERRITIN, TIBC, IRON,  RETICCTPCT in the last 72 hours. ? ?Sepsis Labs: ?No results for input(s): PROCALCITON, LATICACIDVEN in the last 168 hours. ? ?Recent Results (from the past 240 hour(s))  ?MRSA Next Gen by PCR, Nasal     Status: None  ? Collection Time: 04/16/21  2:48 AM  ? Specimen: Nasal Mucosa; Nasal Swab  ?Result Value Ref Range Status  ? MRSA by PCR Next Gen NOT DETECTED NOT DETECTED Final  ?  Comment: (NOTE) ?The GeneXpert MRSA  Assay (FDA approved for NASAL specimens only), ?is one component of a comprehensive MRSA colonization surveillance ?program. It is not intended to diagnose MRSA infection nor to guide ?or monitor treatment for MRSA infections. ?Test performance is not FDA approved in patients less than 2 years ?old. ?Performed at [facility], [place] [address] [place], [place] ?[address] ?  ?  ?Radiology Studies: ?ECHOCARDIOGRAM COMPLETE ? ?Result Date: 04/18/2021 ?   ECHOCARDIOGRAM REPORT   Patient Name:   Holly [name] Date of Exam: 04/18/2021 Medical Rec #:  [id number]       Height:       66.0 in Accession #:    [id number]      Weight:       153.4 lb Date of Birth:  01/26/1966        BSA:          1.787 m? Patient Age:    77 years        BP:           90/54 mmHg Patient Gender: F               HR:           126 bpm. Exam Location:  Inpatient Procedure: 2D Echo Indications:    Atrial fibrillation  History:        Patient has no prior history of Echocardiogram examinations.                 Risk Factors:Dyslipidemia, Diabetes and Hypertension.  Sonographer:    Arlyss Gandy Referring Phys: [place]  1. Left ventricular ejection fraction, by estimation, is 60 to 65%. The left ventricle has normal function. The left ventricle has no regional wall motion abnormalities. There is moderate left ventricular hypertrophy. Left ventricular diastolic function  could not be evaluated.  2. Right ventricular systolic function is normal. The right ventricular size is mildly enlarged. There is moderately  elevated pulmonary artery systolic pressure. The estimated right ventricular systolic pressure is 65.0 mmHg.  3. Left atrial size was severely dilated.  4. Right atrial size was severely dilated.  5. Moderate pericardia

## 2021-04-20 LAB — CBC
HCT: 25.8 % — ABNORMAL LOW (ref 36.0–46.0)
Hemoglobin: 8 g/dL — ABNORMAL LOW (ref 12.0–15.0)
MCH: 28.5 pg (ref 26.0–34.0)
MCHC: 31 g/dL (ref 30.0–36.0)
MCV: 91.8 fL (ref 80.0–100.0)
Platelets: 102 10*3/uL — ABNORMAL LOW (ref 150–400)
RBC: 2.81 MIL/uL — ABNORMAL LOW (ref 3.87–5.11)
RDW: 15.2 % (ref 11.5–15.5)
WBC: 4.8 10*3/uL (ref 4.0–10.5)
nRBC: 0 % (ref 0.0–0.2)

## 2021-04-20 LAB — T4, FREE: Free T4: 0.98 ng/dL (ref 0.61–1.12)

## 2021-04-20 LAB — GLUCOSE, CAPILLARY
Glucose-Capillary: 145 mg/dL — ABNORMAL HIGH (ref 70–99)
Glucose-Capillary: 117 mg/dL — ABNORMAL HIGH (ref 70–99)

## 2021-04-20 MED ORDER — MEDIHONEY WOUND/BURN DRESSING EX PSTE: 1.0000 "application " | PASTE | Freq: Every day | CUTANEOUS | 1 refills | Status: DC

## 2021-04-20 MED ORDER — ROSUVASTATIN CALCIUM 10 MG PO TABS: 10.0000 mg | ORAL_TABLET | Freq: Every day | ORAL | 0 refills | Status: DC

## 2021-04-20 MED ORDER — APIXABAN 5 MG PO TABS
5.0000 mg | ORAL_TABLET | Freq: Two times a day (BID) | ORAL | Status: DC
  Administered 2021-04-20: 5 mg via ORAL
  Filled 2021-04-20: qty 1

## 2021-04-20 MED ORDER — DILTIAZEM HCL ER COATED BEADS 120 MG PO CP24: 120.0000 mg | ORAL_CAPSULE | Freq: Every day | ORAL | 0 refills | Status: DC

## 2021-04-20 MED ORDER — APIXABAN 5 MG PO TABS: 5.0000 mg | ORAL_TABLET | Freq: Two times a day (BID) | ORAL | 0 refills | Status: DC

## 2021-04-20 MED ORDER — MUPIROCIN 2 % EX OINT: 1.0000 "application " | TOPICAL_OINTMENT | Freq: Two times a day (BID) | CUTANEOUS | 0 refills | Status: DC

## 2021-04-20 NOTE — Progress Notes (Signed)
ANTICOAGULATION CONSULT NOTE - Follow Up Consult ? ?Pharmacy Consult for transition from heparin to apixaban ?Indication: atrial fibrillation ? ?Allergies  ?Allergen Reactions  ? Bactrim Nausea And Vomiting  ? Byetta 10 Mcg Pen [Exenatide] Nausea And Vomiting  ? Hctz [Hydrochlorothiazide] Other (See Comments) and Itching  ?  dizziness ?dizziness  ? Spironolactone Other (See Comments)  ?  Dizziness   ? Sulfa Antibiotics Nausea And Vomiting  ? Sulfacetamide Sodium Nausea And Vomiting  ? Sulfamethoxazole-Trimethoprim Nausea And Vomiting  ? ? ?Patient Measurements: ?Height: 5\' 6"  (167.6 cm) ?Weight: 71.6 kg (157 lb 14.4 oz) ?IBW/kg (Calculated) : 59.3 ?Heparin Dosing Weight: 71.4 kg ? ?Vital Signs: ?Temp: 98.2 ?F (36.8 ?C) (03/26 0501) ?Temp Source: Oral (03/26 0501) ?BP: 175/68 (03/26 0501) ?Pulse Rate: 77 (03/26 0501) ? ?Labs: ?Recent Labs  ?  04/18/21 ?0631 04/18/21 ?1125 04/18/21 ?1353 04/19/21 ?0149 04/19/21 ?1254 04/19/21 ?2134 04/20/21 ?0202  ?HGB 7.8*  --   --  7.8*  --   --  8.0*  ?HCT 25.0*  --   --  24.8*  --   --  25.8*  ?PLT 117*  --   --  107*  --   --  102*  ?HEPARINUNFRC  --   --   --  0.14* 0.23* 0.28*  --   ?CREATININE 11.66*  --   --  10.07*  --   --   --   ?TROPONINIHS  --  208* 223*  --   --   --   --   ? ? ?Estimated Creatinine Clearance: 6.2 mL/min (A) (by C-G formula based on SCr of 10.07 mg/dL (H)). ? ?Assessment: ?56 yo F admitted with abd pain and LE edema. Pt has hx of CKD V now with transition to dialysis. Patient was at HD session and went into Afib with RVR. Cardiology has been consulted. Patient was started on IV heparin infusion. Pharmacy is now consulted to transition to apixaban.  ? ?Patient on hemodialysis, <69 years old, and >60 kg so it is appropriate to start at apixaban 5 mg BID. ? ?Goal of Therapy:  ?Monitor platelets by anticoagulation protocol: Yes ?  ?Plan:  ?Discontinue heparin infusion ?Start apixaban 5 mg BID  ?Give first dose of apixaban when heparin is turned off ?Monitor  s/sx of bleeding ? ?Thank you for including pharmacy in the care of this patient. ? ?Zenaida Deed, PharmD ?PGY1 Acute Care Pharmacy Resident  ?Phone: [phone] ?04/20/2021  7:40 AM ? ?Please check [email or web address] for unit-specific pharmacy phone numbers. ? ?

## 2021-04-20 NOTE — Progress Notes (Signed)
Patient discharged: Home with family.  Left via: Wheelchair  Discharge paperwork reviewed and given: to patient and family. Teach back completed. IV and telemetry disconnected. Belongings given to patient.  

## 2021-04-20 NOTE — Discharge Summary (Signed)
? ? ? ? ? ?Physician Discharge Summary  ?Holly [name] BPZ:[id number] DOB: Dec 12, 1965 DOA: 04/15/2021 ? ?PCP: [facility] [place] ? ?Admit date: 04/15/2021 ?Discharge date: 04/20/2021 ? ?Admitted From: home ?Discharge disposition: home ? ? ?Recommendations for Outpatient Follow-Up:  ? ?HD Monday ?TSH 4-6 week ?Cardiology follow up outpatient ?LFTs and FLP in 6 weeks ?Outpatient ABIs ? ? ?Discharge Diagnosis:  ? ?Principal Problem: ?  Acute renal failure superimposed on stage 4 chronic kidney disease ([place]) ?Active Problems: ?  Acute urinary retention ?  Type 2 diabetes mellitus with chronic kidney disease, with long-term current use of insulin ([place]) ?  Hypertension associated with diabetes ([place]) ?  Anemia in chronic kidney disease ?  Depression ?  Atrial fibrillation, new onset ([place]) ? ? ? ?Discharge Condition: Improved. ? ?Diet recommendation: renal ? ?Wound care: wound care below ? ?Code status: Full. ? ? ?History of Present Illness:  ? ?Holly [name] is a 57 y.o. female with medical history significant for CKD stage IV, insulin-dependent T2DM, HTN, HLD, anemia of chronic kidney disease, left foot abscess s/p transmetatarsal amputation, RLS, and depression/anxiety who presented to the ED for evaluation of abdominal and lower extremity swelling. ? ?Patient reports 2 weeks of progressive fluid retention with lower extremity swelling and some abdominal swelling.  She has had progressively decreased urine output with only very small amount of urine output the last 24 hours.  She has not had any associated dyspnea, chest pain, nausea, vomiting.  She does report some recent loose stools.  She states that she does not usually have urge to urinate which she attributes to neuropathy. ?  ?Patient states that her nephrology team are planning on starting outpatient dialysis soon. ? ? ?Hospital Course by Problem:  ? ?AKI on CKD stage IV: ?Patient with progressively worsening renal functions  over the last 4 months with labs showing  BUN 135, Creatinine 12.5, GFR 3 on admission. ?Patient has AV fistula placed in right upper extremity with plans for initiating outpatient dialysis soon. ?Nephrology is consulted by ED, patient initiated on hemodialysis.  ?-HD 3/25 ?-outpatient arranged ?  ?A fib with RVR ?-new ?-cards consult appreciated- stress test normal ?-change heparin to PO eliquis ?-echo: was in a fib during exam.  Small to moderate pericardial effusion without  ?RA/RV collapse, but this may be offset by pulmonary hypertension. TV  ?inflow velocities fluctuate more with  ?heart rat 5.3e than respiration. ?-TSH: slightly elevated, ft4 normal- outpatient follow up ?  ?Acute urinary retention: ?Bladder scan more than 700 mm on arrival. ?Likely contributing some degree of progressive renal dysfunction. ?D/c foley catheter- voiding trial successful ?  ?Type 2 diabetes with CKD ?-resume home meds ?  ?Essential hypertension: ?-d/c BP meds like labetalol to allow for cardizem ?  ?Anemia of chronic disease: ?Hemoglobin relatively stable. ?  ?Depression: ?Continue Prozac ?  ?Right foot chronic wound: ?Podiatry consulted.  X-ray no evidence of osteomyelitis. ?No signs of any infection.  No need for antibiotics. ?No indication for any surgical intervention. ?-patient declined ABIs -try again outpatient  ? ? ? ?Medical Consultants:  ? ?Renal ?cards ? ? ?Discharge Exam:  ? ?Vitals:  ? 04/20/21 0501 04/20/21 0752  ?BP: (!) 175/68 (!) 157/80  ?Pulse: 77 79  ?Resp: 16 20  ?Temp: 98.2 ?F (36.8 ?C) 98.5 ?F (36.9 ?C)  ?SpO2:  92%  ? ?Vitals:  ? 04/19/21 2002 04/20/21 0024 04/20/21 0501 04/20/21 2423  ?BP: (!) 171/53 (!) 159/58 (!) 175/68 (!) 157/80  ?Pulse:  75 77 77 79  ?Resp: 20 15 16 20   ?Temp: 98.7 ?F (37.1 ?C) 98 ?F (36.7 ?C) 98.2 ?F (36.8 ?C) 98.5 ?F (36.9 ?C)  ?TempSrc: Oral Oral Oral   ?SpO2: 93% 93%  92%  ?Weight:  71.6 kg    ?Height:      ? ? ?General exam: Appears calm and comfortable.  ? ? ? ?The results of significant diagnostics from this  hospitalization (including imaging, microbiology, ancillary and laboratory) are listed below for reference.   ? ? ?Procedures and Diagnostic Studies:  ? ?DG Chest Portable 1 View ? ?Result Date: 04/15/2021 ?CLINICAL DATA:  End-stage renal disease. EXAM: PORTABLE CHEST 1 VIEW COMPARISON:  Chest x-ray 12/04/2020. FINDINGS: Cardiac silhouette is enlarged. There are some linear opacities in the left mid lung. There is no pleural effusion or pneumothorax. There is no central pulmonary vascular congestion. No acute fractures are seen. IMPRESSION: 1. Stable cardiomegaly. 2. Linear opacities in the left mid lung favored is atelectasis. Electronically Signed   By: Ronney Asters M.D.   On: 04/15/2021 23:56  ? ?DG Foot 2 Views Right ? ?Result Date: 04/16/2021 ?CLINICAL DATA:  Wound cellulitis EXAM: RIGHT FOOT - 2 VIEW COMPARISON:  None. FINDINGS: Poorly visualized third digit phalangeal tuft due to overlying densities. No cortical erosion or destruction. There is no evidence of fracture or dislocation. There is no evidence of arthropathy or other focal bone abnormality. Soft tissues are unremarkable. Vascular calcifications. IMPRESSION: 1. No definite radiographic findings of osteomyelitis. Poorly visualized third digit phalangeal tuft due to overlying densities. 2.  No acute displaced fracture or dislocation. Electronically Signed   By: Iven Finn M.D.   On: 04/16/2021 18:53  ? ? ? ?Labs:  ? ?Basic Metabolic Panel: ?Recent Labs  ?Lab 04/16/21 ?0615 04/16/21 ?1130 04/17/21 ?0239 04/17/21 ?0626 04/18/21 ?9485 04/19/21 ?0149  ?NA 136  --  137 137 138 136  ?K 4.6  --  5.0 4.9 4.7 4.3  ?CL 102  --  106 105 104 102  ?CO2 18*  --  16* 17* 17* 20*  ?GLUCOSE 88  --  133* 113* 131* 166*  ?BUN 136*  --  137* 136* 115* 95*  ?CREATININE 12.35*  --  12.97* 13.05* 11.66* 10.07*  ?CALCIUM 9.0 9.0 8.6* 8.6* 8.7* 8.7*  ?PHOS  --   --   --  10.8* 9.2*  --   ? ?GFR ?Estimated Creatinine Clearance: 6.2 mL/min (A) (by C-G formula based on SCr  of 10.07 mg/dL (H)). ?Liver Function Tests: ?Recent Labs  ?Lab 04/15/21 ?1607 04/17/21 ?0450 04/18/21 ?0631  ?AST 15  --   --   ?ALT 17  --   --   ?ALKPHOS 92  --   --   ?BILITOT 0.6  --   --   ?PROT 5.9*  --   --   ?ALBUMIN 3.3* 2.8* 2.8*  ? ?Recent Labs  ?Lab 04/15/21 ?1607  ?LIPASE 27  ? ?No results for input(s): AMMONIA in the last 168 hours. ?Coagulation profile ?No results for input(s): INR, PROTIME in the last 168 hours. ? ?CBC: ?Recent Labs  ?Lab 04/15/21 ?1607 04/16/21 ?0615 04/17/21 ?4627 04/17/21 ?0350 04/18/21 ?0938 04/19/21 ?0149 04/20/21 ?0202  ?WBC 6.5   < > 6.0 5.7 4.8 4.2 4.8  ?NEUTROABS 5.0  --   --   --   --   --   --   ?HGB 8.5*   < > 8.1* 7.9* 7.8* 7.8* 8.0*  ?HCT 28.3*   < >  26.2* 25.6* 25.0* 24.8* 25.8*  ?MCV 91.9   < > 90.3 89.8 90.3 90.2 91.8  ?PLT 163   < > 135* 144* 117* 107* 102*  ? < > = values in this interval not displayed.  ? ?Cardiac Enzymes: ?No results for input(s): CKTOTAL, CKMB, CKMBINDEX, TROPONINI in the last 168 hours. ?BNP: ?Invalid input(s): POCBNP ?CBG: ?Recent Labs  ?Lab 04/19/21 ?0605 04/19/21 ?1300 04/19/21 ?1657 04/19/21 ?2106 04/20/21 ?7579  ?GLUCAP 139* 162* 176* 155* 145*  ? ?D-Dimer ?No results for input(s): DDIMER in the last 72 hours. ?Hgb A1c ?No results for input(s): HGBA1C in the last 72 hours. ?Lipid Profile ?Recent Labs  ?  04/19/21 ?0149  ?CHOL 159  ?HDL 42  ?LDLCALC 104*  ?TRIG 66  ?CHOLHDL 3.8  ? ?Thyroid function studies ?Recent Labs  ?  04/19/21 ?0149  ?TSH 5.312*  ? ?Anemia work up ?No results for input(s): VITAMINB12, FOLATE, FERRITIN, TIBC, IRON, RETICCTPCT in the last 72 hours. ?Microbiology ?Recent Results (from the past 240 hour(s))  ?MRSA Next Gen by PCR, Nasal     Status: None  ? Collection Time: 04/16/21  2:48 AM  ? Specimen: Nasal Mucosa; Nasal Swab  ?Result Value Ref Range Status  ? MRSA by PCR Next Gen NOT DETECTED NOT DETECTED Final  ?  Comment: (NOTE) ?The GeneXpert MRSA Assay (FDA approved for NASAL specimens only), ?is one component of a  comprehensive MRSA colonization surveillance ?program. It is not intended to diagnose MRSA infection nor to guide ?or monitor treatment for MRSA infections. ?Test performance is not FDA approved in patients le

## 2021-04-20 NOTE — Progress Notes (Signed)
?[facility] ?Progress Note  ? ?Subjective:  patient seen and examined this AM. Tolerated hd yesterday with net UF 0.5L. normal nuke stress yesterday. No acute events. Anticipating d/c today ? ?Objective ?Vitals:  ? 04/19/21 2002 04/20/21 0024 04/20/21 0501 04/20/21 8338  ?BP: (!) 171/53 (!) 159/58 (!) 175/68 (!) 157/80  ?Pulse: 75 77 77 79  ?Resp: 20 15 16 20   ?Temp: 98.7 ?F (37.1 ?C) 98 ?F (36.7 ?C) 98.2 ?F (36.8 ?C) 98.5 ?F (36.9 ?C)  ?TempSrc: Oral Oral Oral   ?SpO2: 93% 93%  92%  ?Weight:  71.6 kg    ?Height:      ? ?Physical Exam ?General: Well appearing woman, NAD ?Heart: RRR; no murmur/rub ?Lungs: CTA anteriorly ?Abdomen: soft ?Extremities: No LE edema ?Dialysis Access:  RUE AVF + thrill ? ?Additional Objective ?Labs: ?Basic Metabolic Panel: ?Recent Labs  ?Lab 04/17/21 ?0450 04/18/21 ?0631 04/19/21 ?0149  ?NA 137 138 136  ?K 4.9 4.7 4.3  ?CL 105 104 102  ?CO2 17* 17* 20*  ?GLUCOSE 113* 131* 166*  ?BUN 136* 115* 95*  ?CREATININE 13.05* 11.66* 10.07*  ?CALCIUM 8.6* 8.7* 8.7*  ?PHOS 10.8* 9.2*  --   ? ?Liver Function Tests: ?Recent Labs  ?Lab 04/15/21 ?1607 04/17/21 ?0450 04/18/21 ?0631  ?AST 15  --   --   ?ALT 17  --   --   ?ALKPHOS 92  --   --   ?BILITOT 0.6  --   --   ?PROT 5.9*  --   --   ?ALBUMIN 3.3* 2.8* 2.8*  ? ?Recent Labs  ?Lab 04/15/21 ?1607  ?LIPASE 27  ? ?CBC: ?Recent Labs  ?Lab 04/15/21 ?1607 04/16/21 ?0615 04/17/21 ?2505 04/17/21 ?3976 04/18/21 ?7341 04/19/21 ?0149 04/20/21 ?0202  ?WBC 6.5   < > 6.0 5.7 4.8 4.2 4.8  ?NEUTROABS 5.0  --   --   --   --   --   --   ?HGB 8.5*   < > 8.1* 7.9* 7.8* 7.8* 8.0*  ?HCT 28.3*   < > 26.2* 25.6* 25.0* 24.8* 25.8*  ?MCV 91.9   < > 90.3 89.8 90.3 90.2 91.8  ?PLT 163   < > 135* 144* 117* 107* 102*  ? < > = values in this interval not displayed.  ? ?Studies/Results: ?NM Myocar Multi W/Spect W/Wall Motion / EF ? ?Result Date: 04/19/2021 ?CLINICAL DATA:  Chest pain. Shortness of breath. Coronary artery disease with previous stent placement. EXAM:  MYOCARDIAL IMAGING WITH SPECT (REST AND PHARMACOLOGIC-STRESS) GATED LEFT VENTRICULAR WALL MOTION STUDY LEFT VENTRICULAR EJECTION FRACTION TECHNIQUE: Standard myocardial SPECT imaging was performed after resting intravenous injection of 10.3 mCi Tc-23m tetrofosmin. Subsequently, intravenous infusion of Lexiscan was performed under the supervision of the Cardiology staff. At peak effect of the drug, 28.2 mCi Tc-63m tetrofosmin was injected intravenously and standard myocardial SPECT imaging was performed. Quantitative gated imaging was also performed to evaluate left ventricular wall motion, and estimate left ventricular ejection fraction. COMPARISON:  None. FINDINGS: Perfusion: No decreased activity in the left ventricle on stress imaging to suggest reversible ischemia or infarction. Wall Motion: Normal left ventricular wall motion. No left ventricular dilation. Left Ventricular Ejection Fraction: 62 % End diastolic volume 937 ml End systolic volume 47 ml IMPRESSION: 1. No reversible ischemia or infarction. 2. Normal left ventricular wall motion. 3. Left ventricular ejection fraction 62% 4. Non invasive risk stratification*: Low *2012 Appropriate Use Criteria for Coronary Revascularization Focused Update: J Am Coll Cardiol. 9024;09(7):353-299. http://content.airportbarriers.com.aspx?articleid=[email or web address] Electronically Signed  By: Marlaine Hind M.D.   On: 04/19/2021 13:23  ? ?ECHOCARDIOGRAM COMPLETE ? ?Result Date: 04/18/2021 ?   ECHOCARDIOGRAM REPORT   Patient Name:   Holly [name] Date of Exam: 04/18/2021 Medical Rec #:  [id number]       Height:       66.0 in Accession #:    [id number]      Weight:       153.4 lb Date of Birth:  10/08/1965        BSA:          1.787 m? Patient Age:    56 years        BP:           90/54 mmHg Patient Gender: F               HR:           126 bpm. Exam Location:  Inpatient Procedure: 2D Echo Indications:    Atrial fibrillation  History:        Patient has no prior history of  Echocardiogram examinations.                 Risk Factors:Dyslipidemia, Diabetes and Hypertension.  Sonographer:    Arlyss Gandy Referring Phys: [place]  1. Left ventricular ejection fraction, by estimation, is 60 to 65%. The left ventricle has normal function. The left ventricle has no regional wall motion abnormalities. There is moderate left ventricular hypertrophy. Left ventricular diastolic function  could not be evaluated.  2. Right ventricular systolic function is normal. The right ventricular size is mildly enlarged. There is moderately elevated pulmonary artery systolic pressure. The estimated right ventricular systolic pressure is 41.2 mmHg.  3. Left atrial size was severely dilated.  4. Right atrial size was severely dilated.  5. Moderate pericardial effusion. The pericardial effusion is circumferential.  6. The mitral valve is abnormal. Mild mitral valve regurgitation. Mild mitral stenosis. The mean mitral valve gradient is 5.5 mmHg with average heart rate of 115 bpm. Severe mitral annular calcification.  7. Tricuspid valve regurgitation is moderate to severe.  8. The aortic valve is tricuspid. There is mild calcification of the aortic valve. Aortic valve regurgitation is not visualized. Aortic valve sclerosis/calcification is present, without any evidence of aortic stenosis.  9. The inferior vena cava is dilated in size with <50% respiratory variability, suggesting right atrial pressure of 15 mmHg. Comparison(s): No prior Echocardiogram. Conclusion(s)/Recommendation(s): Patient in afib RVR with rates up to the 160s during the study. Small to moderate pericardial effusion without RA/RV collapse, but this may be offset by pulmonary hypertension. TV inflow velocities fluctuate more with heart rate than respiration. Findings communicated with Dr. Eliseo Squires. FINDINGS  Left Ventricle: Left ventricular ejection fraction, by estimation, is 60 to 65%. The left ventricle has normal function.  The left ventricle has no regional wall motion abnormalities. The left ventricular internal cavity size was normal in size. There is  moderate left ventricular hypertrophy. Left ventricular diastolic function could not be evaluated due to atrial fibrillation. Left ventricular diastolic function could not be evaluated. Right Ventricle: The right ventricular size is mildly enlarged. Right vetricular wall thickness was not well visualized. Right ventricular systolic function is normal. There is moderately elevated pulmonary artery systolic pressure. The tricuspid regurgitant velocity is 3.15 m/s, and with an assumed right atrial pressure of 15 mmHg, the estimated right ventricular systolic pressure is 87.8 mmHg. Left Atrium: Left atrial size was severely dilated. Right  Atrium: Right atrial size was severely dilated. Pericardium: There is variation in TV inflow velocities, but this appears to be more related to heart rate than respiration. No definitive echo evidence of tamponade, but would use clinical judgement. A moderately sized pericardial effusion is present. The pericardial effusion is circumferential. Mitral Valve: The mitral valve is abnormal. There is mild thickening of the mitral valve leaflet(s). There is mild calcification of the mitral valve leaflet(s). Severe mitral annular calcification. Mild mitral valve regurgitation. Mild mitral valve stenosis. MV peak gradient, 11.6 mmHg. The mean mitral valve gradient is 5.5 mmHg with average heart rate of 115 bpm. Tricuspid Valve: The tricuspid valve is grossly normal. Tricuspid valve regurgitation is moderate to severe. No evidence of tricuspid stenosis. Aortic Valve: The aortic valve is tricuspid. There is mild calcification of the aortic valve. Aortic valve regurgitation is not visualized. Aortic valve sclerosis/calcification is present, without any evidence of aortic stenosis. Aortic valve mean gradient measures 4.0 mmHg. Aortic valve peak gradient measures 8.5  mmHg. Aortic valve area, by VTI measures 2.77 cm?. Pulmonic Valve: The pulmonic valve was not well visualized. Pulmonic valve regurgitation is trivial. No evidence of pulmonic stenosis. Aorta: The aortic root, ascendi

## 2021-04-20 NOTE — TOC Transition Note (Signed)
Transition of Care (TOC) - CM/SW Discharge Note ? ? ?Patient Details  ?Name: Holly [name] ?MRN: [id number] ?Date of Birth: 10-22-1965 ? ?Transition of Care (TOC) CM/SW Contact:  ?Carles Collet, RN ?Phone Number: ?04/20/2021, 9:40 AM ? ? ?Clinical Narrative:    ?Spoke w patient at bedside. Provide with 30 day and copay reduction cards for Eliquis, provided education for Eliquis. No other TOC needs identified.  ? ? ? ? ?Final next level of care: Home/Self Care ?Barriers to Discharge: No Barriers Identified ? ? ?Patient Goals and CMS Choice ?  ?  ?  ? ?Discharge Placement ?  ?           ?  ?  ?  ?  ? ?Discharge Plan and Services ?  ?  ?           ?  ?  ?  ?  ?  ?  ?  ?  ?  ?  ? ?Social Determinants of Health (SDOH) Interventions ?  ? ? ?Readmission Risk Interventions ?   ? View : No data to display.  ?  ?  ?  ? ? ? ? ? ?

## 2021-04-20 NOTE — Discharge Instructions (Signed)

## 2021-04-21 ENCOUNTER — Telehealth: Payer: Self-pay | Admitting: Cardiology

## 2021-04-21 ENCOUNTER — Telehealth: Payer: Self-pay

## 2021-04-21 ENCOUNTER — Encounter (HOSPITAL_COMMUNITY): Payer: Self-pay | Admitting: *Deleted

## 2021-04-21 NOTE — Telephone Encounter (Signed)
Called patient to schedule 2 week follow patient stated that she will call back to schedule. ?

## 2021-04-21 NOTE — Telephone Encounter (Signed)
Tried to leave voicemail 04/21/21 to schedule patient visit w/Dr. Terri Skains. Voicemail box is full. ?

## 2021-04-24 ENCOUNTER — Other Ambulatory Visit: Payer: Self-pay

## 2021-04-24 ENCOUNTER — Observation Stay (HOSPITAL_COMMUNITY)
Admission: EM | Admit: 2021-04-24 | Discharge: 2021-04-25 | Disposition: A | Discharge status: Home / Self Care | Payer: Commercial Managed Care - PPO | Attending: Family Medicine | Admitting: Family Medicine

## 2021-04-24 ENCOUNTER — Encounter (HOSPITAL_COMMUNITY): Payer: Self-pay

## 2021-04-24 ENCOUNTER — Emergency Department (HOSPITAL_COMMUNITY): Payer: Commercial Managed Care - PPO

## 2021-04-24 DIAGNOSIS — E1122 Type 2 diabetes mellitus with diabetic chronic kidney disease: Secondary | ICD-10-CM | POA: Insufficient documentation

## 2021-04-24 DIAGNOSIS — F32A Depression, unspecified: Secondary | ICD-10-CM | POA: Diagnosis present

## 2021-04-24 DIAGNOSIS — Z7901 Long term (current) use of anticoagulants: Secondary | ICD-10-CM | POA: Insufficient documentation

## 2021-04-24 DIAGNOSIS — I4891 Unspecified atrial fibrillation: Principal | ICD-10-CM | POA: Insufficient documentation

## 2021-04-24 DIAGNOSIS — R0602 Shortness of breath: Secondary | ICD-10-CM | POA: Diagnosis present

## 2021-04-24 DIAGNOSIS — Z992 Dependence on renal dialysis: Secondary | ICD-10-CM | POA: Insufficient documentation

## 2021-04-24 DIAGNOSIS — I501 Left ventricular failure: Secondary | ICD-10-CM | POA: Diagnosis not present

## 2021-04-24 DIAGNOSIS — Z20822 Contact with and (suspected) exposure to covid-19: Secondary | ICD-10-CM | POA: Diagnosis not present

## 2021-04-24 DIAGNOSIS — N186 End stage renal disease: Secondary | ICD-10-CM | POA: Insufficient documentation

## 2021-04-24 DIAGNOSIS — E8770 Fluid overload, unspecified: Secondary | ICD-10-CM | POA: Diagnosis not present

## 2021-04-24 DIAGNOSIS — I1311 Hypertensive heart and chronic kidney disease without heart failure, with stage 5 chronic kidney disease, or end stage renal disease: Secondary | ICD-10-CM | POA: Diagnosis not present

## 2021-04-24 DIAGNOSIS — Z794 Long term (current) use of insulin: Secondary | ICD-10-CM | POA: Insufficient documentation

## 2021-04-24 DIAGNOSIS — I3139 Other pericardial effusion (noninflammatory): Secondary | ICD-10-CM | POA: Diagnosis not present

## 2021-04-24 DIAGNOSIS — I152 Hypertension secondary to endocrine disorders: Secondary | ICD-10-CM | POA: Diagnosis present

## 2021-04-24 LAB — CBC WITH DIFFERENTIAL/PLATELET
Abs Immature Granulocytes: 0.01 10*3/uL (ref 0.00–0.07)
Basophils Absolute: 0 10*3/uL (ref 0.0–0.1)
Basophils Relative: 0 %
Eosinophils Absolute: 0.2 10*3/uL (ref 0.0–0.5)
Eosinophils Relative: 3 %
HCT: 29.5 % — ABNORMAL LOW (ref 36.0–46.0)
Hemoglobin: 8.9 g/dL — ABNORMAL LOW (ref 12.0–15.0)
Immature Granulocytes: 0 %
Lymphocytes Relative: 11 %
Lymphs Abs: 0.7 10*3/uL (ref 0.7–4.0)
MCH: 28.5 pg (ref 26.0–34.0)
MCHC: 30.2 g/dL (ref 30.0–36.0)
MCV: 94.6 fL (ref 80.0–100.0)
Monocytes Absolute: 0.6 10*3/uL (ref 0.1–1.0)
Monocytes Relative: 9 %
Neutro Abs: 4.8 10*3/uL (ref 1.7–7.7)
Neutrophils Relative %: 77 %
Platelets: 133 10*3/uL — ABNORMAL LOW (ref 150–400)
RBC: 3.12 MIL/uL — ABNORMAL LOW (ref 3.87–5.11)
RDW: 15.2 % (ref 11.5–15.5)
WBC: 6.2 10*3/uL (ref 4.0–10.5)
nRBC: 0 % (ref 0.0–0.2)

## 2021-04-24 LAB — BASIC METABOLIC PANEL
Anion gap: 11 (ref 5–15)
BUN: 33 mg/dL — ABNORMAL HIGH (ref 6–20)
CO2: 28 mmol/L (ref 22–32)
Calcium: 8.8 mg/dL — ABNORMAL LOW (ref 8.9–10.3)
Chloride: 99 mmol/L (ref 98–111)
Creatinine, Ser: 4.59 mg/dL — ABNORMAL HIGH (ref 0.44–1.00)
GFR, Estimated: 11 mL/min — ABNORMAL LOW (ref >60)
Glucose, Bld: 126 mg/dL — ABNORMAL HIGH (ref 70–99)
Potassium: 3.6 mmol/L (ref 3.5–5.1)
Sodium: 138 mmol/L (ref 135–145)

## 2021-04-24 LAB — RENAL FUNCTION PANEL
Albumin: 2.9 g/dL — ABNORMAL LOW (ref 3.5–5.0)
Anion gap: 11 (ref 5–15)
BUN: 34 mg/dL — ABNORMAL HIGH (ref 6–20)
CO2: 28 mmol/L (ref 22–32)
Calcium: 8.7 mg/dL — ABNORMAL LOW (ref 8.9–10.3)
Chloride: 98 mmol/L (ref 98–111)
Creatinine, Ser: 4.64 mg/dL — ABNORMAL HIGH (ref 0.44–1.00)
GFR, Estimated: 10 mL/min — ABNORMAL LOW (ref >60)
Glucose, Bld: 119 mg/dL — ABNORMAL HIGH (ref 70–99)
Phosphorus: 4.6 mg/dL (ref 2.5–4.6)
Potassium: 3.6 mmol/L (ref 3.5–5.1)
Sodium: 137 mmol/L (ref 135–145)

## 2021-04-24 LAB — GLUCOSE, CAPILLARY: Glucose-Capillary: 225 mg/dL — ABNORMAL HIGH (ref 70–99)

## 2021-04-24 LAB — RESP PANEL BY RT-PCR (FLU A&B, COVID) ARPGX2
Influenza A by PCR: NEGATIVE
Influenza B by PCR: NEGATIVE
SARS Coronavirus 2 by RT PCR: NEGATIVE

## 2021-04-24 LAB — TROPONIN I (HIGH SENSITIVITY)
Troponin I (High Sensitivity): 109 ng/L (ref <18)
Troponin I (High Sensitivity): 180 ng/L (ref <18)

## 2021-04-24 LAB — MAGNESIUM: Magnesium: 2 mg/dL (ref 1.7–2.4)

## 2021-04-24 LAB — BRAIN NATRIURETIC PEPTIDE: B Natriuretic Peptide: 2049.6 pg/mL — ABNORMAL HIGH (ref 0.0–100.0)

## 2021-04-24 MED ORDER — FLUOXETINE HCL 20 MG PO CAPS
40.0000 mg | ORAL_CAPSULE | Freq: Every day | ORAL | Status: DC
  Administered 2021-04-24: 40 mg via ORAL
  Filled 2021-04-24: qty 2

## 2021-04-24 MED ORDER — DILTIAZEM HCL-DEXTROSE 125-5 MG/125ML-% IV SOLN (PREMIX)
INTRAVENOUS | Status: AC
  Filled 2021-04-24: qty 125

## 2021-04-24 MED ORDER — CHLORHEXIDINE GLUCONATE CLOTH 2 % EX PADS
6.0000 | MEDICATED_PAD | Freq: Every day | CUTANEOUS | Status: DC
  Administered 2021-04-25: 6 via TOPICAL

## 2021-04-24 MED ORDER — ZOLPIDEM TARTRATE 5 MG PO TABS
5.0000 mg | ORAL_TABLET | Freq: Every day | ORAL | Status: DC
  Administered 2021-04-24: 5 mg via ORAL
  Filled 2021-04-24: qty 1

## 2021-04-24 MED ORDER — ONDANSETRON HCL 4 MG/2ML IJ SOLN: 4.0000 mg | Freq: Four times a day (QID) | INTRAMUSCULAR | Status: DC | PRN

## 2021-04-24 MED ORDER — APIXABAN 5 MG PO TABS
5.0000 mg | ORAL_TABLET | Freq: Two times a day (BID) | ORAL | Status: DC
  Administered 2021-04-24 – 2021-04-25 (×2): 5 mg via ORAL
  Filled 2021-04-24 (×2): qty 1

## 2021-04-24 MED ORDER — MELATONIN 3 MG PO TABS
3.0000 mg | ORAL_TABLET | Freq: Every day | ORAL | Status: DC
  Administered 2021-04-24: 3 mg via ORAL
  Filled 2021-04-24: qty 1

## 2021-04-24 MED ORDER — DILTIAZEM LOAD VIA INFUSION
10.0000 mg | Freq: Once | INTRAVENOUS | Status: AC
Start: 2021-04-24 — End: 2021-04-24
  Administered 2021-04-24: 10 mg via INTRAVENOUS
  Filled 2021-04-24: qty 10

## 2021-04-24 MED ORDER — LABETALOL HCL 200 MG PO TABS
200.0000 mg | ORAL_TABLET | Freq: Two times a day (BID) | ORAL | Status: DC
  Administered 2021-04-24 – 2021-04-25 (×2): 200 mg via ORAL
  Filled 2021-04-24 (×2): qty 1

## 2021-04-24 MED ORDER — MELATONIN GUMMIES 2.5 MG PO CHEW: CHEWABLE_TABLET | Freq: Every day | ORAL | Status: DC

## 2021-04-24 MED ORDER — ACETAMINOPHEN 325 MG PO TABS: 650.0000 mg | ORAL_TABLET | Freq: Four times a day (QID) | ORAL | Status: DC | PRN

## 2021-04-24 MED ORDER — SEVELAMER CARBONATE 800 MG PO TABS
800.0000 mg | ORAL_TABLET | Freq: Three times a day (TID) | ORAL | Status: DC
  Administered 2021-04-25: 800 mg via ORAL
  Filled 2021-04-24: qty 1

## 2021-04-24 MED ORDER — SODIUM CHLORIDE 0.9% FLUSH
3.0000 mL | Freq: Two times a day (BID) | INTRAVENOUS | Status: DC
  Administered 2021-04-24: 3 mL via INTRAVENOUS

## 2021-04-24 MED ORDER — CALCITRIOL 0.5 MCG PO CAPS
0.5000 ug | ORAL_CAPSULE | ORAL | Status: DC
  Administered 2021-04-25: 0.5 ug via ORAL
  Filled 2021-04-24: qty 1

## 2021-04-24 MED ORDER — DILTIAZEM HCL ER COATED BEADS 120 MG PO CP24
120.0000 mg | ORAL_CAPSULE | Freq: Once | ORAL | Status: AC
  Administered 2021-04-24: 120 mg via ORAL
  Filled 2021-04-24: qty 1

## 2021-04-24 MED ORDER — DILTIAZEM HCL-DEXTROSE 125-5 MG/125ML-% IV SOLN (PREMIX)
5.0000 mg/h | INTRAVENOUS | Status: DC
  Administered 2021-04-24: 5 mg/h via INTRAVENOUS

## 2021-04-24 MED ORDER — SODIUM CHLORIDE 0.9 % IV SOLN
125.0000 mg | INTRAVENOUS | Status: DC
  Administered 2021-04-25: 125 mg via INTRAVENOUS
  Filled 2021-04-24: qty 10

## 2021-04-24 MED ORDER — ONDANSETRON HCL 4 MG PO TABS: 4.0000 mg | ORAL_TABLET | Freq: Four times a day (QID) | ORAL | Status: DC | PRN

## 2021-04-24 MED ORDER — INSULIN ASPART 100 UNIT/ML IJ SOLN
0.0000 [IU] | Freq: Three times a day (TID) | INTRAMUSCULAR | Status: DC
  Administered 2021-04-25: 1 [IU] via SUBCUTANEOUS
  Administered 2021-04-25: 2 [IU] via SUBCUTANEOUS

## 2021-04-24 MED ORDER — INSULIN GLARGINE (1 UNIT DIAL) 300 UNIT/ML ~~LOC~~ SOPN: 10.0000 [IU] | PEN_INJECTOR | Freq: Every day | SUBCUTANEOUS | Status: DC

## 2021-04-24 MED ORDER — ROSUVASTATIN CALCIUM 5 MG PO TABS
10.0000 mg | ORAL_TABLET | Freq: Every day | ORAL | Status: DC
  Filled 2021-04-24: qty 2

## 2021-04-24 MED ORDER — ROPINIROLE HCL 1 MG PO TABS
1.0000 mg | ORAL_TABLET | Freq: Every day | ORAL | Status: DC
  Administered 2021-04-25: 1 mg via ORAL
  Filled 2021-04-24 (×4): qty 1

## 2021-04-24 MED ORDER — ACETAMINOPHEN 650 MG RE SUPP: 650.0000 mg | Freq: Four times a day (QID) | RECTAL | Status: DC | PRN

## 2021-04-24 MED ORDER — INSULIN GLARGINE-YFGN 100 UNIT/ML ~~LOC~~ SOLN
10.0000 [IU] | Freq: Every day | SUBCUTANEOUS | Status: DC
  Filled 2021-04-24 (×2): qty 0.1

## 2021-04-24 MED ORDER — HYDRALAZINE HCL 20 MG/ML IJ SOLN
5.0000 mg | INTRAMUSCULAR | Status: DC | PRN
Start: 2021-04-24 — End: 2021-04-25

## 2021-04-24 NOTE — Assessment & Plan Note (Signed)
Continue Prozac

## 2021-04-24 NOTE — ED Provider Notes (Signed)
?[facility] ?Provider Note ? ? ?CSN: [id number] ?Arrival date & time: 04/24/21  1113 ? ?  ? ?History ? ?Chief Complaint  ?Patient presents with  ? Atrial Fibrillation  ? ? ?Holly [name] is a 56 y.o. female. ? ?The history is provided by the patient.  ?Shortness of Breath ?Severity:  Mild ?Onset quality:  Gradual ?Duration:  2 hours ?Timing:  Constant ?Progression:  Unchanged ?Chronicity:  New ?Context comment:  She was at dialysis and got short of breath and palpitations.  EMS states in atrial fibrillation with RVR.  Newly diagnosed last week. ?Relieved by:  Nothing ?Worsened by:  Nothing ?Associated symptoms: no abdominal pain, no chest pain, no claudication, no cough, no diaphoresis, no ear pain, no fever, no headaches, no sore throat, no sputum production, no syncope and no vomiting   ?Risk factors comment:  Atrial fibrillation on Eliquis, end-stage renal disease on hemodialysis ? ?  ? ?Home Medications ?Prior to Admission medications   ?Medication Sig Start Date End Date Taking? Authorizing Provider  ?apixaban (ELIQUIS) 5 MG TABS tablet Take 1 tablet (5 mg total) by mouth 2 (two) times daily. 04/20/21  Yes Geradine Girt, DO  ?diltiazem (CARDIZEM CD) 120 MG 24 hr capsule Take 1 capsule (120 mg total) by mouth daily. 04/20/21  Yes Eulogio Bear U, DO  ?felodipine (PLENDIL) 5 MG 24 hr tablet Take 5 mg by mouth at bedtime. 04/22/21  Yes [provider]  ?FLUoxetine (PROZAC) 40 MG capsule Take 40 mg by mouth at bedtime.   Yes [provider]  ?insulin glargine, 1 Unit Dial, (TOUJEO SOLOSTAR) 300 UNIT/ML Solostar Pen Inject 10 Units into the skin daily. 03/10/21  Yes [provider]  ?labetalol (NORMODYNE) 200 MG tablet Take 200 mg by mouth 2 (two) times daily. 04/22/21  Yes [provider]  ?leptospermum manuka honey (MEDIHONEY) PSTE paste Apply 1 application. topically daily. 04/20/21  Yes Vann, Jessica U, DO  ?MELATONIN GUMMIES PO Take 2  tablets by mouth at bedtime. Unsure of dose   Yes [provider]  ?mupirocin ointment (BACTROBAN) 2 % Place 1 application. into the nose 2 (two) times daily. 04/20/21  Yes Vann, Jessica U, DO  ?rOPINIRole (REQUIP) 1 MG tablet TAKE 1 TABLET EVERY DAY AS NEEDED ?Patient taking differently: Take 1 mg by mouth at bedtime. 03/20/21  Yes Genia Harold, MD  ?Semaglutide, 1 MG/DOSE, (OZEMPIC, 1 MG/DOSE,) 4 MG/3ML SOPN Inject 1 mg into the skin every Wednesday. 02/20/21  Yes [provider]  ?sevelamer carbonate (RENVELA) 800 MG tablet Take 800 mg by mouth 3 (three) times daily with meals. 11/28/20  Yes [provider]  ?silver sulfADIAZINE (SILVADENE) 1 % cream Apply 1 application. topically daily. Right foot   Yes [provider]  ?zolpidem (AMBIEN) 5 MG tablet Take 5 mg by mouth at bedtime.   Yes [provider]  ?doxycycline (VIBRA-TABS) 100 MG tablet Take 100 mg by mouth 2 (two) times daily. ?Patient not taking: Reported on 04/24/2021 04/15/21   [provider]  ?metolazone (ZAROXOLYN) 5 MG tablet Take 5 mg by mouth daily. 04/22/21   [provider]  ?rosuvastatin (CRESTOR) 10 MG tablet Take 1 tablet (10 mg total) by mouth at bedtime. ?Patient not taking: Reported on 04/24/2021 04/20/21   Geradine Girt, DO  ?rOPINIRole (REQUIP) 1 MG tablet Take 1 tablet (1 mg total) by mouth daily as needed. 09/20/20   Mesner, Corene Cornea, MD  ?   ? ?Allergies    ?  Bactrim, Byetta 10 mcg pen [exenatide], Hctz [hydrochlorothiazide], Spironolactone, Sulfa antibiotics, Sulfacetamide sodium, and Sulfamethoxazole-trimethoprim   ? ?Review of Systems   ?Review of Systems  ?Constitutional:  Negative for diaphoresis and fever.  ?HENT:  Negative for ear pain and sore throat.   ?Respiratory:  Positive for shortness of breath. Negative for cough and sputum production.   ?Cardiovascular:  Negative for chest pain, claudication and syncope.  ?Gastrointestinal:  Negative for abdominal pain and  vomiting.  ?Neurological:  Negative for headaches.  ? ?Physical Exam ?Updated Vital Signs ?BP 126/73   Pulse 83   Temp 97.7 ?F (36.5 ?C) (Oral)   Resp 11   Ht 5' 6.5" (1.689 m)   Wt 72 kg   LMP 07/17/2011   SpO2 96%   BMI 25.24 kg/m?  ?Physical Exam ?Vitals and nursing note reviewed.  ?Constitutional:   ?   General: She is not in acute distress. ?   Appearance: She is well-developed.  ?HENT:  ?   Head: Normocephalic and atraumatic.  ?   Nose: Nose normal.  ?   Mouth/Throat:  ?   Mouth: Mucous membranes are moist.  ?Eyes:  ?   Extraocular Movements: Extraocular movements intact.  ?   Conjunctiva/sclera: Conjunctivae normal.  ?   Pupils: Pupils are equal, round, and reactive to light.  ?Cardiovascular:  ?   Rate and Rhythm: Tachycardia present. Rhythm irregular.  ?   Pulses: Normal pulses.  ?   Heart sounds: Normal heart sounds. No murmur heard. ?Pulmonary:  ?   Effort: Pulmonary effort is normal. No respiratory distress.  ?   Breath sounds: Normal breath sounds.  ?Abdominal:  ?   Palpations: Abdomen is soft.  ?   Tenderness: There is no abdominal tenderness.  ?Musculoskeletal:     ?   General: No swelling.  ?   Cervical back: Neck supple.  ?   Right lower leg: Edema present.  ?   Left lower leg: Edema present.  ?Skin: ?   General: Skin is warm and dry.  ?   Capillary Refill: Capillary refill takes less than 2 seconds.  ?Neurological:  ?   General: No focal deficit present.  ?   Mental Status: She is alert.  ?Psychiatric:     ?   Mood and Affect: Mood normal.  ? ? ?ED Results / Procedures / Treatments   ?Labs ?(all labs ordered are listed, but only abnormal results are displayed) ?Labs Reviewed  ?CBC WITH DIFFERENTIAL/PLATELET - Abnormal; Notable for the following components:  ?    Result Value  ? RBC 3.12 (*)   ? Hemoglobin 8.9 (*)   ? HCT 29.5 (*)   ? Platelets 133 (*)   ? All other components within normal limits  ?BASIC METABOLIC PANEL - Abnormal; Notable for the following components:  ? Glucose, Bld 126  (*)   ? BUN 33 (*)   ? Creatinine, Ser 4.59 (*)   ? Calcium 8.8 (*)   ? GFR, Estimated 11 (*)   ? All other components within normal limits  ?BRAIN NATRIURETIC PEPTIDE - Abnormal; Notable for the following components:  ? B Natriuretic Peptide 2,049.6 (*)   ? All other components within normal limits  ?TROPONIN I (HIGH SENSITIVITY) - Abnormal; Notable for the following components:  ? Troponin I (High Sensitivity) 109 (*)   ? All other components within normal limits  ?RESP PANEL BY RT-PCR (FLU A&B, COVID) ARPGX2  ?MAGNESIUM  ?TROPONIN I (HIGH SENSITIVITY)  ? ? ?  EKG ?EKG Interpretation ? ?Date/Time:  Thursday April 24 2021 11:16:46 EDT ?Ventricular Rate:  126 ?PR Interval:    ?QRS Duration: 97 ?QT Interval:  313 ?QTC Calculation: 710 ?R Axis:   128 ?Text Interpretation: Atrial fibrillation Right axis deviation Consider left ventricular hypertrophy Anterior Q waves, possibly due to LVH Repol abnrm suggests ischemia, diffuse leads Confirmed by Lennice Sites ([id number]) on 04/24/2021 11:17:34 AM ? ?Radiology ?DG Chest Portable 1 View ? ?Result Date: 04/24/2021 ?CLINICAL DATA:  Shortness of breath. EXAM: PORTABLE CHEST 1 VIEW COMPARISON:  April 15, 2021. FINDINGS: Stable cardiomegaly. Right lung is clear. Small left pleural effusion is noted. Mild left basilar subsegmental atelectasis is noted. Bony thorax is unremarkable. IMPRESSION: Mild left basilar subsegmental atelectasis with small left pleural effusion. Electronically Signed   By: Marijo Conception M.D.   On: 04/24/2021 11:31   ? ?Procedures ?Marland KitchenCritical Care ?Performed by: Lennice Sites, DO ?Authorized by: Lennice Sites, DO  ? ?Critical care provider statement:  ?  Critical care time (minutes):  40 ?  Critical care was necessary to treat or prevent imminent or life-threatening deterioration of the following conditions: atrial fibrilation with rvr. ?  Critical care was time spent personally by me on the following activities:  Blood draw for specimens, development of  treatment plan with patient or surrogate, discussions with consultants, discussions with primary provider, evaluation of patient's response to treatment, examination of patient, obtaining history from patient or surrogate, orderi

## 2021-04-24 NOTE — ED Triage Notes (Signed)
Pt BIB [facility] from dialysis c/o a-fib RVR. Pt states she got about a hr and half of her treatment when she started feeling palpitations and dizziness. Pt was recently diagnosed with a-fib. Pt denies any CP but states she still feels dizzy but feels like the palpitations are better.  ? ? ?106/60 ?97% ?118-150 ? ?

## 2021-04-24 NOTE — Consult Note (Signed)
CARDIOLOGY CONSULT NOTE  ?Patient ID: ?Holly [name] ?MRN: [id number] ?DOB/AGE: [birth year] 56 y.o. ? ?Admit date: 04/24/2021 ?Referring Physician: Zacarias Pontes, ER ?Reason for Consultation: A-fib ? ?HPI:  ? ?56 y.o. African-American female  with hypertension, hyperlipidemia, type 2 diabetes mellitus, ESRD on HD,  depression, anxiety, new diagnosis of paroxysmal A-fib, now back in A-fib with RVR. ? ?Patient was diagnosed with paroxysmal atrial fibrillation during her hospitalization last week with abdominal pain, leg swelling, and worsening CKD.  She was then started on hemodialysis.  She denied any chest pain or shortness of breath, did report occasional palpitations.  Work-up in hospital hospitalization showed no ischemia on stress testing, moderate LVH, preserved LVEF, posterior pericardial effusion of moderate size.  She was discharged on diltiazem 120 mg daily, and Eliquis 5 mg twice daily, for management of A-fib. ? ?Patient was at her outpatient dialysis center today, when she was found to be in tachycardia, and was sent to emergency room.  She does not have any complaints at this time. ? ?Personally reviewed telemetry, details below. ? ? ? ?Past Medical History:  ?Diagnosis Date  ? Acute cystitis   ? Anxiety state, unspecified   ? Background diabetic retinopathy(362.01)   ? Bulimia   ? Calculus of kidney   ? Cellulitis and abscess of foot 01/24/2015  ? left foot  ? Chronic inflammatory demyelinating polyneuritis (HCC)   ? Chronic kidney disease, stage IV (severe) ([place])   ? Complication of anesthesia   ? slow to wake up, had lost a lot of blood - 2010  ? Diarrhea   ? symptomatic  ? Disorders of magnesium metabolism   ? DM2 (diabetes mellitus, type 2) ([place])   ? Dysthymic disorder   ? Dysuria   ? Edema   ? moderate  ? Encounter for long-term (current) use of other medications   ? Esophageal reflux   ? Essential hypertension, benign   ? Fever 10/2018  ? Heart murmur   ? been told very slight, never given her any  problems  ? History of blood transfusion X 6-7; 2010 - present (03/29/2014)  ? "related to OR; kidney issues"  ? HLD (hyperlipidemia)   ? HDL goal >50, LDL goal <100  ? Iron deficiency anemia   ? "suppose to get procrit injections q 3 wks; usually don't" do it" (03/29/2014)  ? Irritable bowel syndrome   ? Left heart failure ([place])   ? Muscle weakness (generalized)   ? Neuralgia, neuritis, and radiculitis, unspecified   ? Palpitations   ? Peripheral autonomic neuropathy in disorders classified elsewhere(337.1)   ? Pneumonia 2010  ? Primary pulmonary hypertension ([place])   ? not seen at CATH  (7m Hg), pt not aware of this  ? RLS (restless legs syndrome)   ? Type II or unspecified type diabetes mellitus with other specified manifestations, not stated as uncontrolled   ? [facility] Normal coronary arteries  ? Unspecified essential hypertension   [facility] No significant renal artery stenosis bilaterally  ?  ? ?Past Surgical History:  ?Procedure Laterality Date  ? AMPUTATION Left 01/26/2015  ? Procedure: POST TRANS MET ;  Surgeon: MNewt Minion MD;  [facility]  Service: Orthopedics;  Laterality: Left;  ? APPENDECTOMY    ? AV FISTULA PLACEMENT Right 01/08/2015  ? Procedure: ARTERIOVENOUS (AV) FISTULA CREATION;  Surgeon: CAngelia Mould MD;  [facility] [facility]  Service: Vascular;  Laterality: Right;  ? CESAREAN SECTION  2001; 1999; 1994  ? DILATION AND CURETTAGE OF UTERUS    ? EYE SURGERY    ? INCISION AND DRAINAGE OF WOUND Right 2010  ? "serious staph infection"  ? PARS PLANA VITRECTOMY Bilateral 2010  ? "related to DM"  ? TONSILLECTOMY AND ADENOIDECTOMY    ?  ? ? ?Family History  ?Problem Relation Age of Onset  ? Hypertension Mother   ? Myelodysplastic syndrome Father   ? Pancreatic cancer Other   ?  ? ?Social History: ?Social History  ? ?Socioeconomic History  ? Marital status: Soil scientist  ?  Spouse  name: Not on file  ? Number of children: 4  ? Years of education: Not on file  ? Highest education level: Not on file  ?Occupational History  ? Occupation: collections  ?Tobacco Use  ? Smoking status: Never  ? Smokeless tobacco: Never  ?Vaping Use  ? Vaping Use: Never used  ?Substance and Sexual Activity  ? Alcohol use: Yes  ?  Comment: 03/29/2014 "might have a drink a couple times/yr"  ? Drug use: No  ? Sexual activity: Yes  ?Other Topics Concern  ? Not on file  ?Social History Narrative  ? Not on file  ? ?Social Determinants of Health  ? ?Financial Resource Strain: Not on file  ?Food Insecurity: Not on file  ?Transportation Needs: Not on file  ?Physical Activity: Not on file  ?Stress: Not on file  ?Social Connections: Not on file  ?Intimate Partner Violence: Not on file  ?  ? ? ? ?Review of Systems  ?Cardiovascular:  Positive for palpitations. Negative for chest pain, dyspnea on exertion, leg swelling and syncope.  ?  ? ?Physical Exam: ?Physical Exam ?Vitals and nursing note reviewed.  ?Constitutional:   ?   General: She is not in acute distress. ?Neck:  ?   Vascular: No JVD.  ?Cardiovascular:  ?   Rate and Rhythm: Tachycardia present. Rhythm irregular.  ?   Heart sounds: Normal heart sounds. No murmur heard. ?Pulmonary:  ?   Effort: Pulmonary effort is normal.  ?   Breath sounds: Rales (Bibasilar) present. No wheezing.  ?Musculoskeletal:  ?   Right lower leg: No edema.  ?   Left lower leg: No edema.  ? ? ? ?  ?Lab Results: ?Reviewed and interpreted: ?Telemetry 04/24/2021: ?Multiple runs of sinus rhythm with atrial tachycardia, interspersed with intermittent paroxysmal atrial fibrillation ? ?EKG 04/24/2021: ?Atrial fibrillation with RVR 126 bpm.  Diffuse ST depression, consider ischemia ? ?Lexiscan nuclear stress test 04/19/2021: ?1. No reversible ischemia or infarction. ?2. Normal left ventricular wall motion. ?3. Left ventricular ejection fraction 62% ?4. Non invasive risk stratification*: Low ? ?Imaging/tests  reviewed and independently interpreted: ?Echocardiogram 04/18/2021: ? 1. Left ventricular ejection fraction, by estimation, is 60 to 65%. The  ?left ventricle has normal function. The left ventricle has no regional  ?wall motion abnormalities. There is moderate left ventricular hypertrophy.  ?Left ventricular diastolic function  ? could not be evaluated.  ? 2. Right ventricular systolic function is normal. The right ventricular  ?size is mildly enlarged. There is moderately elevated pulmonary artery  ?systolic pressure. The estimated right ventricular systolic pressure is  ?90.3 mmHg.  ? 3. Left atrial size was severely dilated.  ? 4. Right atrial size was severely dilated.  ? 5. Moderate pericardial effusion. The pericardial effusion is  ?circumferential.  ? 6. The mitral valve is abnormal. Mild mitral valve  regurgitation. Mild  ?mitral stenosis. The mean mitral valve gradient is 5.5 mmHg with average  ?heart rate of 115 bpm. Severe mitral annular calcification.  ? 7. Tricuspid valve regurgitation is moderate to severe.  ? 8. The aortic valve is tricuspid. There is mild calcification of the  ?aortic valve. Aortic valve regurgitation is not visualized. Aortic valve  ?sclerosis/calcification is present, without any evidence of aortic  ?stenosis.  ? 9. The inferior vena cava is dilated in size with <50% respiratory  ?variability, suggesting right atrial pressure of 15 mmHg.  ? ?Comparison(s): No prior Echocardiogram.  ? ?Conclusion(s)/Recommendation(s): Patient in afib RVR with rates up to the  ?160s during the study. Small to moderate pericardial effusion without  ?RA/RV collapse, but this may be offset by pulmonary hypertension. TV  ?inflow velocities fluctuate more with  ?heart rate than respiration. Findings communicated with Dr. Eliseo Squires.  ? ? ? ?Cardiac Studies: ?EKG 04/24/2021: ?Afib w/RVR ?Diffuse ST depression, consider ischemia ? ?Echocardiogram 04/18/2021: ? 1. Left ventricular ejection fraction, by estimation, is  60 to 65%. The  ?left ventricle has normal function. The left ventricle has no regional  ?wall motion abnormalities. There is moderate left ventricular hypertrophy.  ?Left ventricular diastolic function  coul

## 2021-04-24 NOTE — ED Notes (Signed)
Placed on 1L [place] for intermittently low SPO2, does not wear O2 at home. Resting comfortably.  ?

## 2021-04-24 NOTE — Assessment & Plan Note (Addendum)
-  Patient presenting with recurrent afib after recent new diagnosis.  ?-She was in RVR at the time of today's presentation and was symptomatic; she may benefit from Pill in the Pocket (flecainide) or other similar therapy moving forward ?-Will plan to place in observation status in SDU for Diltiazem drip as per protocol with plan to transition to PO Diltiazem once heart rate is controlled (resting HR 110 or lower).  ?-HS troponin mildly to moderately elevated, will repeat but unlikely ACS based on symptoms. ?-Cardiology consulted and will continue to follow; Dr. Virgina Jock has recommended a higher dose of Dilt at 240 mg daily at the time of dc ?-Continue Eliquis ?

## 2021-04-24 NOTE — Assessment & Plan Note (Signed)
-  Patient recently started on chronic TTS HD ?-She was unable to complete even half of her session today ?-Nephrology prn order set utilized ?-She does not appear to be volume overloaded or otherwise in need of acute HD ?-Nephrology notified that patient will need HD ?

## 2021-04-24 NOTE — Consult Note (Signed)
 [facility] Renal Consultation Note    Indication for Consultation:  Management of ESRD/hemodialysis; anemia, hypertension/volume and secondary hyperparathyroidism PCP:  HPI: Holly [name] is a 56 y.o. female with new ESRD on hemodialysis at [facility]. She was followed at [facility] by Dr. Marlee. PMH: DMT2, HTN, recent new onset of Afib on Apixaban , SHPT, Anemia of CKD. She attended HD today. 1.5 hours into treatment, she developed fluttery feeling in chest, dizziness. HR was noted to be 136. She was sent to [facility] ED for evaluation. Upon arrival to ED, she was noted to be in Afib with ventricular rate 126. She was started on Diltiazem  at 5 mg/hr. Currently still in Afib with ventricular rate at 76. BP is stable. She has been admitted as observation patient for Afib RVR. She will have HD tomorrow on schedule. Labs unremarkable but were drawn immediately post HD.    Past Medical History:  Diagnosis Date   Anxiety state, unspecified    Background diabetic retinopathy(362.01)    Bulimia    Calculus of kidney    Cellulitis and abscess of foot 01/24/2015   left foot   Chronic inflammatory demyelinating polyneuritis (HCC)    Chronic kidney disease, stage IV (severe) (HCC)    DM2 (diabetes mellitus, type 2) (HCC)    Dysthymic disorder    Esophageal reflux    Essential hypertension, benign    Heart murmur    been told very slight, never given her any problems   History of blood transfusion X 6-7; 2010 - present (03/29/2014)   related to OR; kidney issues   HLD (hyperlipidemia)    HDL goal >50, LDL goal <100   Iron deficiency anemia    suppose to get procrit  injections q 3 wks; usually don't do it (03/29/2014)   Irritable bowel syndrome    Left heart failure (HCC)    Primary pulmonary hypertension (HCC)    not seen at CATH  (35mm Hg), pt not aware of this   RLS (restless legs syndrome)    Past Surgical History:  Procedure Laterality  Date   AMPUTATION Left 01/26/2015   Procedure: POST TRANS MET ;  Surgeon: Jerona Harden GAILS, MD;  Location: [facility] OR;  Service: Orthopedics;  Laterality: Left;   APPENDECTOMY     AV FISTULA PLACEMENT Right 01/08/2015   Procedure: ARTERIOVENOUS (AV) FISTULA CREATION;  Surgeon: Lonni GORMAN Blade, MD;  Location: [facility] OR;  Service: Vascular;  Laterality: Right;   CESAREAN SECTION  2001; 1999; 1994   DILATION AND CURETTAGE OF UTERUS     EYE SURGERY     INCISION AND DRAINAGE OF WOUND Right 2010   serious staph infection   PARS PLANA VITRECTOMY Bilateral 2010   related to DM   TONSILLECTOMY AND ADENOIDECTOMY     Family History  Problem Relation Age of Onset   Hypertension Mother    Myelodysplastic syndrome Father    Pancreatic cancer Other    Social History:  reports that she has never smoked. She has never used smokeless tobacco. She reports current alcohol use. She reports that she does not use drugs. Allergies  Allergen Reactions   Bactrim Nausea And Vomiting   [place]etta 10 Mcg Pen [Exenatide] Nausea And Vomiting   Hctz [Hydrochlorothiazide ] Other (See Comments) and Itching    dizziness dizziness   Spironolactone Other (See Comments)    Dizziness and nausea/vomiting   Sulfa Antibiotics Nausea And Vomiting   Sulfacetamide Sodium Nausea And Vomiting   Sulfamethoxazole-Trimethoprim  Nausea And Vomiting   Prior to Admission medications   Medication Sig Start Date End Date Taking? Authorizing Provider  apixaban  (ELIQUIS ) 5 MG TABS tablet Take 1 tablet (5 mg total) by mouth 2 (two) times daily. 04/20/21  Yes Vann, Jessica U, DO  diltiazem  (CARDIZEM  CD) 120 MG 24 hr capsule Take 1 capsule (120 mg total) by mouth daily. 04/20/21  Yes Vann, Jessica U, DO  FLUoxetine  (PROZAC ) 40 MG capsule Take 40 mg by mouth at bedtime.   Yes [provider]  insulin  glargine, 1 Unit Dial , (TOUJEO  SOLOSTAR) 300 UNIT/ML Solostar Pen Inject 10 Units into the skin daily. 03/10/21  Yes [provider]  labetalol  (NORMODYNE ) 200 MG tablet Take 200 mg by mouth 2 (two) times daily. 04/22/21  Yes [provider]  leptospermum manuka honey (MEDIHONEY) PSTE paste Apply 1 application. topically daily. 04/20/21  Yes Vann, Jessica U, DO  MELATONIN GUMMIES PO Take 2 tablets by mouth at bedtime. Unsure of dose   Yes [provider]  mupirocin  ointment (BACTROBAN ) 2 % Place 1 application. into the nose 2 (two) times daily. 04/20/21  Yes Vann, Jessica U, DO  rOPINIRole  (REQUIP ) 1 MG tablet TAKE 1 TABLET EVERY DAY AS NEEDED Patient taking differently: Take 1 mg by mouth at bedtime. 03/20/21  Yes Rush Nest, MD  Semaglutide, 1 MG/DOSE, (OZEMPIC, 1 MG/DOSE,) 4 MG/3ML SOPN Inject 1 mg into the skin every Wednesday. 02/20/21  Yes [provider]  sevelamer  carbonate (RENVELA ) 800 MG tablet Take 800 mg by mouth 3 (three) times daily with meals. 11/28/20  Yes [provider]  silver sulfADIAZINE (SILVADENE) 1 % cream Apply 1 application. topically daily. Right foot   Yes [provider]  zolpidem  (AMBIEN ) 5 MG tablet Take 5 mg by mouth at bedtime.   Yes [provider]  metolazone (ZAROXOLYN) 5 MG tablet Take 5 mg by mouth daily. 04/22/21   [provider]  rosuvastatin  (CRESTOR ) 10 MG tablet Take 1 tablet (10 mg total) by mouth at bedtime. Patient not taking: Reported on 04/24/2021 04/20/21   Vann, Jessica U, DO  rOPINIRole  (REQUIP ) 1 MG tablet Take 1 tablet (1 mg total) by mouth daily as needed. 09/20/20   Mesner, Selinda, MD   Current Facility-Administered Medications  Medication Dose Route Frequency Provider Last Rate Last Admin   acetaminophen  (TYLENOL ) tablet 650 mg  650 mg Oral Q6H PRN Barbarann Nest, MD       Or   acetaminophen  (TYLENOL ) suppository 650 mg  650 mg Rectal Q6H PRN Barbarann Nest, MD       apixaban  (ELIQUIS ) tablet 5 mg  5 mg Oral BID Barbarann Nest, MD       diltiazem  (CARDIZEM ) 125 mg in dextrose  5% 125 mL (1 mg/mL) infusion   5-15 mg/hr Intravenous Continuous Barbarann Nest, MD 5 mL/hr at 04/24/21 1414 5 mg/hr at 04/24/21 1414   FLUoxetine  (PROZAC ) capsule 40 mg  40 mg Oral QHS Yates, Jennifer, MD       hydrALAZINE  (APRESOLINE ) injection 5 mg  5 mg Intravenous Q4H PRN Barbarann Nest, MD       insulin  aspart (novoLOG ) injection 0-6 Units  0-6 Units Subcutaneous TID [facility] Barbarann Nest, MD       [START ON 04/25/2021] insulin  glargine (1 Unit Dial ) (TOUJEO ) Solostar Pen SOPN 10 Units  10 Units Subcutaneous Daily Barbarann Nest, MD       labetalol  (NORMODYNE ) tablet 200 mg  200 mg Oral BID Barbarann Nest, MD  Melatonin Gummies CHEW   Oral QHS Barbarann Nest, MD       ondansetron  (ZOFRAN ) tablet 4 mg  4 mg Oral Q6H PRN Barbarann Nest, MD       Or   ondansetron  (ZOFRAN ) injection 4 mg  4 mg Intravenous Q6H PRN Barbarann Nest, MD       rOPINIRole  (REQUIP ) tablet 1 mg  1 mg Oral QHS Barbarann Nest, MD       rosuvastatin  (CRESTOR ) tablet 10 mg  10 mg Oral QHS Yates, Jennifer, MD       sevelamer  carbonate (RENVELA ) tablet 800 mg  800 mg Oral TID WC Barbarann Nest, MD       sodium chloride  flush (NS) 0.9 % injection 3 mL  3 mL Intravenous Q12H Barbarann Nest, MD       zolpidem  (AMBIEN ) tablet 5 mg  5 mg Oral QHS Barbarann Nest, MD       Current Outpatient Medications  Medication Sig Dispense Refill   apixaban  (ELIQUIS ) 5 MG TABS tablet Take 1 tablet (5 mg total) by mouth 2 (two) times daily. 60 tablet 0   diltiazem  (CARDIZEM  CD) 120 MG 24 hr capsule Take 1 capsule (120 mg total) by mouth daily. 30 capsule 0   FLUoxetine  (PROZAC ) 40 MG capsule Take 40 mg by mouth at bedtime.     insulin  glargine, 1 Unit Dial , (TOUJEO  SOLOSTAR) 300 UNIT/ML Solostar Pen Inject 10 Units into the skin daily.     labetalol  (NORMODYNE ) 200 MG tablet Take 200 mg by mouth 2 (two) times daily.     leptospermum manuka honey (MEDIHONEY) PSTE paste Apply 1 application. topically daily. 44 mL 1   MELATONIN GUMMIES PO Take 2 tablets by mouth  at bedtime. Unsure of dose     mupirocin  ointment (BACTROBAN ) 2 % Place 1 application. into the nose 2 (two) times daily. 22 g 0   rOPINIRole  (REQUIP ) 1 MG tablet TAKE 1 TABLET EVERY DAY AS NEEDED (Patient taking differently: Take 1 mg by mouth at bedtime.) 30 tablet 2   Semaglutide, 1 MG/DOSE, (OZEMPIC, 1 MG/DOSE,) 4 MG/3ML SOPN Inject 1 mg into the skin every Wednesday.     sevelamer  carbonate (RENVELA ) 800 MG tablet Take 800 mg by mouth 3 (three) times daily with meals.     silver sulfADIAZINE (SILVADENE) 1 % cream Apply 1 application. topically daily. Right foot     zolpidem  (AMBIEN ) 5 MG tablet Take 5 mg by mouth at bedtime.     metolazone (ZAROXOLYN) 5 MG tablet Take 5 mg by mouth daily.     rosuvastatin  (CRESTOR ) 10 MG tablet Take 1 tablet (10 mg total) by mouth at bedtime. (Patient not taking: Reported on 04/24/2021) 30 tablet 0   Labs: Basic Metabolic Panel: Recent Labs  Lab 04/18/21 0631 04/19/21 0149 04/24/21 1339  NA 138 136 138  K 4.7 4.3 3.6  CL 104 102 99  CO2 17* 20* 28  GLUCOSE 131* 166* 126*  BUN 115* 95* 33*  CREATININE 11.66* 10.07* 4.59*  CALCIUM  8.7* 8.7* 8.8*  PHOS 9.2*  --   --    Liver Function Tests: Recent Labs  Lab 04/18/21 0631  ALBUMIN 2.8*   No results for input(s): LIPASE, AMYLASE in the last 168 hours. No results for input(s): AMMONIA in the last 168 hours. CBC: Recent Labs  Lab 04/18/21 0631 04/19/21 0149 04/20/21 0202 04/24/21 1339  WBC 4.8 4.2 4.8 6.2  NEUTROABS  --   --   --  4.8  HGB 7.8* 7.8* 8.0* 8.9*  HCT 25.0* 24.8* 25.8* 29.5*  MCV 90.3 90.2 91.8 94.6  PLT 117* 107* 102* 133*   Cardiac Enzymes: No results for input(s): CKTOTAL, CKMB, CKMBINDEX, TROPONINI in the last 168 hours. CBG: Recent Labs  Lab 04/19/21 1300 04/19/21 1657 04/19/21 2106 04/20/21 0544 04/20/21 1050  GLUCAP 162* 176* 155* 145* 117*   Iron Studies: No results for input(s): IRON, TIBC, TRANSFERRIN, FERRITIN in the last 72  hours. Studies/Results: DG Chest Portable 1 View  Result Date: 04/24/2021 CLINICAL DATA:  Shortness of breath. EXAM: PORTABLE CHEST 1 VIEW COMPARISON:  April 15, 2021. FINDINGS: Stable cardiomegaly. Right lung is clear. Small left pleural effusion is noted. Mild left basilar subsegmental atelectasis is noted. Bony thorax is unremarkable. IMPRESSION: Mild left basilar subsegmental atelectasis with small left pleural effusion. Electronically Signed   By: Lynwood Landy Raddle M.D.   On: 04/24/2021 11:31    ROS: As per HPI otherwise negative.   Physical Exam: Vitals:   04/24/21 1440 04/24/21 1450 04/24/21 1600 04/24/21 1630  BP: 132/74 126/73 130/74 125/71  Pulse: 84 83 83 74  Resp: (!) 9 11 13    Temp:      TempSrc:      SpO2: 94% 96% 96%   Weight:      Height:         General: Pleasant, Well developed, well nourished, in no acute distress. Head: Normocephalic, atraumatic, sclera non-icteric, mucus membranes are moist Neck: Supple. JVD not elevated. Lungs: Clear bilaterally to auscultation without wheezes, rales, or rhonchi. Breathing is unlabored. Heart: irreg, irreg with S1 S2.2/6 systolic M RUSB. No R/G. Afib on monitor, rate 70s.  Abdomen: Soft, non-tender, non-distended with normoactive bowel sounds. No rebound/guarding. No obvious abdominal masses. M-S:  Strength and tone appear normal for age. Lower extremities:Trace-1+ edema R posterior calf, trace edema L posterior calf.  Neuro: Alert and oriented X 3. Moves all extremities spontaneously. Psych:  Responds to questions appropriately with a normal affect. Dialysis Access: L AVF aneurysmal and bruised. +T/B  Dialysis Orders: [facility] M, T, Th,F 3 hrs 180NRe 250/300 71 kg 2.0 K/ 2.0 Ca AVF -No Heparin  -Venofer 100 mg IV X 10 doses (2/10 doses have been given) -Mircera 50 mcg IV q  2 weeks (Last dose 04/22/2021)  Assessment/Plan:  Afib RVR: On diltiazem . Rate controlled now but hasn't converted. Per primary  ESRD -   New start to HD. Next HD 04/25/2021. No heparin    Hypertension/volume  - BP well controlled. Continue home medications. Mild LE edema but lungs clear. Establishing EDW. UF as tolerated.   Anemia  - HGB 8.9. Recent ESA dose. Continue Venofer load.   Metabolic bone disease -  Corrected Ca+ OK. Add PO4 to today's labs. OP PTH 1168 04/21/2021. Start Calcitriol  0.5 mcg PO. On Renvela  binders.   Nutrition - Renal/Carb mod diet. Nepro, renal vitamin.   DMT2-per primary  Ricka DEL. Delores, NP-C 04/24/2021, 5:01 PM  [employer] [phone]

## 2021-04-24 NOTE — H&P (Signed)
?History and Physical  ? ? ?Patient: Sarika Baldini [place] PCH:[id number] DOB: Feb 26, 1965 ?DOA: 04/24/2021 ?DOS: the patient was seen and examined on 04/24/2021 ?PCP: [facility], [place]  ?Patient coming from: Home - lives with husband and 2 grown children; NOK: Olin Hauser, [phone] ? ? ?Chief Complaint: AFib with RVR ? ?HPI: Aliya Sol Decoste is a 56 y.o. female with medical history significant of bulimia; chronic inflammatory demyelinating polyneuritis; DM; HTN; HLD; IBS; pulmonary HTN; stage 4 CKD with anemia; recent diagnosis of afib; PVD s/p L TMA; and restless legs presenting with recurrent afib with RVR.  She was previously hospitalized from 3/21-26 for new onset ESRD, started on HD.  She also had new-onset afib with RVR and was started on Eliquis.  She was at HD today with palpitations and dizziness started again and she was found to be in afib.  She became acutely symptomatic after about an hour of HD when she developed palpitations.  She also felt dizzy.  She was started on Dilt and is feeling better, curious about going home.  She is having LE edema which usually resolves with HD (didn't finish her session today). ? ? ? ?ER Course:  New afib with RVR, new ESRD and started HD last week.  At HD today and 1 hour in, started with palpitations and developed afib with RVR. Dr. Robb Matar recommends Dilt drip.  Nephrology also consulted.  More rate controlled now. ? ? ? ? ?Review of Systems: As mentioned in the history of present illness. All other systems reviewed and are negative. ?Past Medical History:  ?Diagnosis Date  ? Anxiety state, unspecified   ? Background diabetic retinopathy(362.01)   ? Bulimia   ? Calculus of kidney   ? Cellulitis and abscess of foot 01/24/2015  ? left foot  ? Chronic inflammatory demyelinating polyneuritis (HCC)   ? Chronic kidney disease, stage IV (severe) ([place])   ? DM2 (diabetes mellitus, type 2) ([place])   ? Dysthymic disorder   ? Esophageal reflux   ? Essential hypertension, benign    ? Heart murmur   ? been told very slight, never given her any problems  ? History of blood transfusion X 6-7; 2010 - present (03/29/2014)  ? "related to OR; kidney issues"  ? HLD (hyperlipidemia)   ? HDL goal >50, LDL goal <100  ? Iron deficiency anemia   ? "suppose to get procrit injections q 3 wks; usually don't" do it" (03/29/2014)  ? Irritable bowel syndrome   ? Left heart failure ([place])   ? Primary pulmonary hypertension ([place])   ? not seen at CATH  (34m Hg), pt not aware of this  ? RLS (restless legs syndrome)   ? ?Past Surgical History:  ?Procedure Laterality Date  ? AMPUTATION Left 01/26/2015  ? Procedure: POST TRANS MET ;  Surgeon: MNewt Minion MD;  [facility]  Service: Orthopedics;  Laterality: Left;  ? APPENDECTOMY    ? AV FISTULA PLACEMENT Right 01/08/2015  ? Procedure: ARTERIOVENOUS (AV) FISTULA CREATION;  Surgeon: CAngelia Mould MD;  [facility]  Service: Vascular;  Laterality: Right;  ? CESAREAN SECTION  2001; 1999; 1994  ? DILATION AND CURETTAGE OF UTERUS    ? EYE SURGERY    ? INCISION AND DRAINAGE OF WOUND Right 2010  ? "serious staph infection"  ? PARS PLANA VITRECTOMY Bilateral 2010  ? "related to DM"  ? TONSILLECTOMY AND ADENOIDECTOMY    ? ?Social History:  reports that she has never smoked. She  has never used smokeless tobacco. She reports current alcohol use. She reports that she does not use drugs. ? ?Allergies  ?Allergen Reactions  ? Bactrim Nausea And Vomiting  ? Byetta 10 Mcg Pen [Exenatide] Nausea And Vomiting  ? Hctz [Hydrochlorothiazide] Other (See Comments) and Itching  ?  dizziness ?dizziness  ? Spironolactone Other (See Comments)  ?  Dizziness and nausea/vomiting  ? Sulfa Antibiotics Nausea And Vomiting  ? Sulfacetamide Sodium Nausea And Vomiting  ? Sulfamethoxazole-Trimethoprim Nausea And Vomiting  ? ? ?Family History  ?Problem Relation Age of Onset  ? Hypertension Mother   ? Myelodysplastic syndrome Father   ? Pancreatic cancer Other   ? ? ?Prior to Admission  medications   ?Medication Sig Start Date End Date Taking? Authorizing Provider  ?apixaban (ELIQUIS) 5 MG TABS tablet Take 1 tablet (5 mg total) by mouth 2 (two) times daily. 04/20/21  Yes Geradine Girt, DO  ?diltiazem (CARDIZEM CD) 120 MG 24 hr capsule Take 1 capsule (120 mg total) by mouth daily. 04/20/21  Yes Eulogio Bear U, DO  ?felodipine (PLENDIL) 5 MG 24 hr tablet Take 5 mg by mouth at bedtime. 04/22/21  Yes [provider]  ?FLUoxetine (PROZAC) 40 MG capsule Take 40 mg by mouth at bedtime.   Yes [provider]  ?insulin glargine, 1 Unit Dial, (TOUJEO SOLOSTAR) 300 UNIT/ML Solostar Pen Inject 10 Units into the skin daily. 03/10/21  Yes [provider]  ?labetalol (NORMODYNE) 200 MG tablet Take 200 mg by mouth 2 (two) times daily. 04/22/21  Yes [provider]  ?leptospermum manuka honey (MEDIHONEY) PSTE paste Apply 1 application. topically daily. 04/20/21  Yes Vann, Jessica U, DO  ?MELATONIN GUMMIES PO Take 2 tablets by mouth at bedtime. Unsure of dose   Yes [provider]  ?mupirocin ointment (BACTROBAN) 2 % Place 1 application. into the nose 2 (two) times daily. 04/20/21  Yes Vann, Jessica U, DO  ?rOPINIRole (REQUIP) 1 MG tablet TAKE 1 TABLET EVERY DAY AS NEEDED ?Patient taking differently: Take 1 mg by mouth at bedtime. 03/20/21  Yes Genia Harold, MD  ?Semaglutide, 1 MG/DOSE, (OZEMPIC, 1 MG/DOSE,) 4 MG/3ML SOPN Inject 1 mg into the skin every Wednesday. 02/20/21  Yes [provider]  ?sevelamer carbonate (RENVELA) 800 MG tablet Take 800 mg by mouth 3 (three) times daily with meals. 11/28/20  Yes [provider]  ?silver sulfADIAZINE (SILVADENE) 1 % cream Apply 1 application. topically daily. Right foot   Yes [provider]  ?zolpidem (AMBIEN) 5 MG tablet Take 5 mg by mouth at bedtime.   Yes [provider]  ?doxycycline (VIBRA-TABS) 100 MG tablet Take 100 mg by mouth 2 (two) times daily. ?Patient not taking: Reported on  04/24/2021 04/15/21   [provider]  ?metolazone (ZAROXOLYN) 5 MG tablet Take 5 mg by mouth daily. 04/22/21   [provider]  ?rosuvastatin (CRESTOR) 10 MG tablet Take 1 tablet (10 mg total) by mouth at bedtime. ?Patient not taking: Reported on 04/24/2021 04/20/21   Geradine Girt, DO  ?rOPINIRole (REQUIP) 1 MG tablet Take 1 tablet (1 mg total) by mouth daily as needed. 09/20/20   Mesner, Corene Cornea, MD  ? ? ?Physical Exam: ?Vitals:  ? 04/24/21 1400 04/24/21 1430 04/24/21 1440 04/24/21 1450  ?BP: 134/73 111/72 132/74 126/73  ?Pulse: 81 (!) 107 84 83  ?Resp: 18 15 (!) 9 11  ?Temp:      ?TempSrc:      ?SpO2: 98% 100% 94% 96%  ?  Weight:      ?Height:      ? ?General:  Appears calm and comfortable and is in NAD ?Eyes:  PERRL, EOMI, normal lids, iris ?ENT:  grossly normal hearing, lips & tongue, mmm ?Neck:  no LAD, masses or thyromegaly ?Cardiovascular:  RRR, no m/r/g. 1-2+ LE edema.  ?Respiratory:   CTA bilaterally with no wheezes/rales/rhonchi.  Normal respiratory effort. ?Abdomen:  soft, NT, ND ?Skin:  no rash or induration seen on limited exam ?Musculoskeletal:  grossly normal tone BUE/BLE, good ROM, no bony abnormality ?Psychiatric:  grossly normal mood and affect, speech fluent and appropriate, AOx3 ?Neurologic:  CN 2-12 grossly intact, moves all extremities in coordinated fashion ? ? ?Radiological Exams on Admission: ?Independently reviewed - see discussion in A/P where applicable ? ?DG Chest Portable 1 View ? ?Result Date: 04/24/2021 ?CLINICAL DATA:  Shortness of breath. EXAM: PORTABLE CHEST 1 VIEW COMPARISON:  April 15, 2021. FINDINGS: Stable cardiomegaly. Right lung is clear. Small left pleural effusion is noted. Mild left basilar subsegmental atelectasis is noted. Bony thorax is unremarkable. IMPRESSION: Mild left basilar subsegmental atelectasis with small left pleural effusion. Electronically Signed   By: Marijo Conception M.D.   On: 04/24/2021 11:31   ? ?EKG: Independently reviewed.  Afib with rate  126; nonspecific ST changes that may be rate related ? ? ?Labs on Admission: I have personally reviewed the available labs and imaging studies at the time of the admission. ? ?Pertinent labs:   ? ?Glucose 126 ?BUN 33

## 2021-04-24 NOTE — Assessment & Plan Note (Signed)
-  Recent A1c was 6.3, indicating good control ?-Continue glargine, hole Ozempic ?-Cover with very sensitive-scale SSI ? ?

## 2021-04-24 NOTE — ED Notes (Signed)
Alert, NAD, calm, interactive, resps e/u. Denies pain, sob, nausea, or visual changes. Admits to some light headedness. Cardizem infusing, rate increased for continued high rate 118-130. VSS. sBP 112. ?

## 2021-04-24 NOTE — Assessment & Plan Note (Signed)
-  Continue labetalol ?-Transition back to oral Dilt when able ?

## 2021-04-24 NOTE — Plan of Care (Signed)

## 2021-04-25 DIAGNOSIS — I4891 Unspecified atrial fibrillation: Secondary | ICD-10-CM | POA: Diagnosis not present

## 2021-04-25 DIAGNOSIS — Z794 Long term (current) use of insulin: Secondary | ICD-10-CM

## 2021-04-25 DIAGNOSIS — E1122 Type 2 diabetes mellitus with diabetic chronic kidney disease: Secondary | ICD-10-CM | POA: Diagnosis not present

## 2021-04-25 DIAGNOSIS — Z992 Dependence on renal dialysis: Secondary | ICD-10-CM | POA: Diagnosis not present

## 2021-04-25 DIAGNOSIS — N186 End stage renal disease: Secondary | ICD-10-CM | POA: Diagnosis not present

## 2021-04-25 LAB — CBC
HCT: 27.7 % — ABNORMAL LOW (ref 36.0–46.0)
Hemoglobin: 8.2 g/dL — ABNORMAL LOW (ref 12.0–15.0)
MCH: 27.7 pg (ref 26.0–34.0)
MCHC: 29.6 g/dL — ABNORMAL LOW (ref 30.0–36.0)
MCV: 93.6 fL (ref 80.0–100.0)
Platelets: 132 10*3/uL — ABNORMAL LOW (ref 150–400)
RBC: 2.96 MIL/uL — ABNORMAL LOW (ref 3.87–5.11)
RDW: 15.1 % (ref 11.5–15.5)
WBC: 5.6 10*3/uL (ref 4.0–10.5)
nRBC: 0 % (ref 0.0–0.2)

## 2021-04-25 LAB — BASIC METABOLIC PANEL
Anion gap: 12 (ref 5–15)
BUN: 40 mg/dL — ABNORMAL HIGH (ref 6–20)
CO2: 29 mmol/L (ref 22–32)
Calcium: 8.8 mg/dL — ABNORMAL LOW (ref 8.9–10.3)
Chloride: 95 mmol/L — ABNORMAL LOW (ref 98–111)
Creatinine, Ser: 5.35 mg/dL — ABNORMAL HIGH (ref 0.44–1.00)
GFR, Estimated: 9 mL/min — ABNORMAL LOW (ref >60)
Glucose, Bld: 250 mg/dL — ABNORMAL HIGH (ref 70–99)
Potassium: 3.5 mmol/L (ref 3.5–5.1)
Sodium: 136 mmol/L (ref 135–145)

## 2021-04-25 LAB — GLUCOSE, CAPILLARY
Glucose-Capillary: 198 mg/dL — ABNORMAL HIGH (ref 70–99)
Glucose-Capillary: 201 mg/dL — ABNORMAL HIGH (ref 70–99)

## 2021-04-25 MED ORDER — DILTIAZEM HCL ER COATED BEADS 240 MG PO CP24
240.0000 mg | ORAL_CAPSULE | Freq: Every day | ORAL | Status: DC
  Administered 2021-04-25: 240 mg via ORAL
  Filled 2021-04-25: qty 1

## 2021-04-25 MED ORDER — LIDOCAINE-PRILOCAINE 2.5-2.5 % EX CREA
1.0000 "application " | TOPICAL_CREAM | CUTANEOUS | Status: DC | PRN
  Filled 2021-04-25: qty 5

## 2021-04-25 MED ORDER — DILTIAZEM HCL ER COATED BEADS 120 MG PO CP24: 240.0000 mg | ORAL_CAPSULE | Freq: Every day | ORAL | 1 refills | Status: DC

## 2021-04-25 MED ORDER — LIDOCAINE HCL (PF) 1 % IJ SOLN: 5.0000 mL | INTRAMUSCULAR | Status: DC | PRN

## 2021-04-25 MED ORDER — SODIUM CHLORIDE 0.9 % IV SOLN: 100.0000 mL | INTRAVENOUS | Status: DC | PRN

## 2021-04-25 MED ORDER — PENTAFLUOROPROP-TETRAFLUOROETH EX AERO: 1.0000 "application " | INHALATION_SPRAY | CUTANEOUS | Status: DC | PRN

## 2021-04-25 NOTE — Hospital Course (Addendum)
? ? ?-  TSH: slightly elevated, ft4 normal- outpatient follow up ? ?Paroxysmal Afib: ?Patient has been consistently in Afib since last night, rate is well controlled. ?Now off diltiazem drip. ?Recommend rate control approach for now until at least a few weeks of anticoagulation. ?Increased diltiazem to 240 mg daily. ?Also on labetalol for hypertension management.  ?High CHA2DS2-VASc score, continue Eliquis 5 mg twice daily. ?  ?  ?Pericardial fusion: ?Moderate, posteriorly located.  Most likely related to advance CKD.  I suspect this will improve with hemodialysis. ?

## 2021-04-25 NOTE — Progress Notes (Signed)
Contacted by out-pt TCU RN, Jolayne Haines, that pt was re-hospitalized. D/C order noted. Pt unable to receive out-pt HD tomorrow due to TCU is a Mon-Fri program. Jolayne Haines advised pt for d/c today and should resume on Monday.  ? ?Melven Sartorius ?Renal Navigator ?[phone] ?

## 2021-04-25 NOTE — TOC Transition Note (Signed)
Transition of Care (TOC) - CM/SW Discharge Note ? ? ?Patient Details  ?Name: Holly [name] ?MRN: [id number] ?Date of Birth: May 08, 1965 ? ?Transition of Care (TOC) CM/SW Contact:  ?Zenon Mayo, RN ?Phone Number: ?04/25/2021, 12:51 PM ? ? ?Clinical Narrative:    ?Patient in diaylsis, she states her son or daughter will transport her home today.  She presented with afib RVR, was put on cardizem drip, changed to po, she will dc after HD today.   ? ? ?  ?  ? ? ?Patient Goals and CMS Choice ?  ?  ?  ? ?Discharge Placement ?  ?           ?  ?  ?  ?  ? ?Discharge Plan and Services ?  ?  ?           ?  ?  ?  ?  ?  ?  ?  ?  ?  ?  ? ?Social Determinants of Health (SDOH) Interventions ?  ? ? ?Readmission Risk Interventions ?   ? View : No data to display.  ?  ?  ?  ? ? ? ? ? ?

## 2021-04-25 NOTE — Progress Notes (Signed)
?   04/25/21 0717  ?Assess: MEWS Score  ?Temp 98.1 ?F (36.7 ?C)  ?BP 116/77  ?Pulse Rate (!) 116  ?ECG Heart Rate (!) 117  ?Resp 18  ?SpO2 96 %  ?O2 Device Room Air  ?Assess: MEWS Score  ?MEWS Temp 0  ?MEWS Systolic 0  ?MEWS Pulse 2  ?MEWS RR 0  ?MEWS LOC 0  ?MEWS Score 2  ?MEWS Score Color Yellow  ?Assess: if the MEWS score is Yellow or Red  ?Were vital signs taken at a resting state? Yes  ?Focused Assessment No change from prior assessment  ?Early Detection of Sepsis Score *See Row Information* Low  ?MEWS guidelines implemented *See Row Information* Yes  ?Treat  ?MEWS Interventions Administered scheduled meds/treatments  ?Pain Scale 0-10  ?Pain Score 0  ?Take Vital Signs  ?Increase Vital Sign Frequency  Yellow: Q 2hr X 2 then Q 4hr X 2, if remains yellow, continue Q 4hrs  ?Escalate  ?MEWS: Escalate Yellow: discuss with charge nurse/RN and consider discussing with provider and RRT  ?Notify: Charge Nurse/RN  ?Name of Charge Nurse/RN Notified [place]  ?Date Charge Nurse/RN Notified 04/25/21  ?Time Charge Nurse/RN Notified 0732  ?Document  ?Patient Outcome Stabilized after interventions  ?Progress note created (see row info) Yes  ? ? ?

## 2021-04-25 NOTE — Discharge Summary (Signed)
?Physician Discharge Summary ?  ?Patient: Holly [name] MRN: [id number] DOB: [birth year]  ?Admit date:     04/24/2021  ?Discharge date: 04/25/21  ?Discharge Physician: Murray Hodgkins  ? ?PCP: [facility], [place]  ? ?Recommendations at discharge:  ? ?Follow-up afib RVR see below ?Follow-up slightly elevated TSH ? ?Discharge Diagnoses: ?Principal Problem: ?  Atrial fibrillation with RVR ([place]) ?Active Problems: ?  Type 2 diabetes mellitus with chronic kidney disease, with long-term current use of insulin ([place]) ?  Hypertension associated with diabetes ([place]) ?  Depression ?  ESRD on dialysis [facility]) ? ?Resolved Problems: ?  * No resolved hospital problems. * ? ?Hospital Course: ?56 year old woman recently started on hemodialysis, complicated past medical history, during that hospitalization also had atrial fibrillation with rapid ventricular response.  Presented back 3/30 with palpitations and dizziness and was admitted for further treatment of atrial fibrillation with rapid ventricular response.  She was seen by cardiology and diltiazem dose was increased.  The following day she was found to be in sinus rhythm and clinically stable and cleared by cardiology for discharge. ? ?* Atrial fibrillation with RVR ([place]) ?--Now in sinus rhythm today, off diltiazem drip, diltiazem oral dose increased to 240 mg daily by cardiology.  Also continue labetalol for hypertension management.  Continue apixaban on discharge. ? ?Pericardial fusion  ?--Moderate, posteriorly located.  Most likely related to advance CKD. Cardiology suspects this will improve with hemodialysis. ? ?Type 2 diabetes mellitus with chronic kidney disease, with long-term current use of insulin ([place]) ?--Recent A1c was 6.3, indicating good control ?-Continue glargine, Ozempic on discharge. ? ?Hypertension associated with diabetes ([place]) ?--Continue labetalol ?--stable ? ?ESRD on dialysis [facility]) ?--Patient recently started on chronic TTS HD ?--continue HD as an  outpatient ? ?Depression ?-Continue Prozac ? ?-TSH: slightly elevated, ft4 normal- outpatient follow up ? ?  ? ?Consultants: Cardiology ?Procedures performed: None  ?Disposition: Home ?Diet recommendation:  ?Discharge Diet Orders (From admission, onward)  ? ?  Start     Ordered  ? 04/25/21 0000  Diet - low sodium heart healthy       ? 04/25/21 1209  ? 04/25/21 0000  Diet Carb Modified       ? 04/25/21 1209  ? ?  ?  ? ?  ? ? ?DISCHARGE MEDICATION: ?Allergies as of 04/25/2021   ? ?   Reactions  ? Bactrim Nausea And Vomiting  ? Byetta 10 Mcg Pen [exenatide] Nausea And Vomiting  ? Hctz [hydrochlorothiazide] Other (See Comments), Itching  ? dizziness ?dizziness  ? Spironolactone Other (See Comments)  ? Dizziness and nausea/vomiting  ? Sulfa Antibiotics Nausea And Vomiting  ? Sulfacetamide Sodium Nausea And Vomiting  ? Sulfamethoxazole-trimethoprim Nausea And Vomiting  ? ?  ? ?  ?Medication List  ?  ? ?TAKE these medications   ? ?apixaban 5 MG Tabs tablet ?Commonly known as: ELIQUIS ?Take 1 tablet (5 mg total) by mouth 2 (two) times daily. ?  ?diltiazem 120 MG 24 hr capsule ?Commonly known as: CARDIZEM CD ?Take 2 capsules (240 mg total) by mouth daily. ?What changed: how much to take ?  ?FLUoxetine 40 MG capsule ?Commonly known as: PROZAC ?Take 40 mg by mouth at bedtime. ?  ?labetalol 200 MG tablet ?Commonly known as: NORMODYNE ?Take 200 mg by mouth 2 (two) times daily. ?  ?leptospermum manuka honey Pste paste ?Apply 1 application. topically daily. ?  ?MELATONIN GUMMIES PO ?Take 2 tablets by mouth at bedtime. Unsure of dose ?  ?metolazone 5 MG tablet ?  Commonly known as: ZAROXOLYN ?Take 5 mg by mouth daily. ?  ?mupirocin ointment 2 % ?Commonly known as: BACTROBAN ?Place 1 application. into the nose 2 (two) times daily. ?  ?Ozempic (1 MG/DOSE) 4 MG/3ML Sopn ?Generic drug: Semaglutide (1 MG/DOSE) ?Inject 1 mg into the skin every Wednesday. ?  ?rOPINIRole 1 MG tablet ?Commonly known as: REQUIP ?TAKE 1 TABLET EVERY DAY AS  NEEDED ?What changed: See the new instructions. ?  ?rosuvastatin 10 MG tablet ?Commonly known as: CRESTOR ?Take 1 tablet (10 mg total) by mouth at bedtime. ?  ?sevelamer carbonate 800 MG tablet ?Commonly known as: RENVELA ?Take 800 mg by mouth 3 (three) times daily with meals. ?  ?silver sulfADIAZINE 1 % cream ?Commonly known as: SILVADENE ?Apply 1 application. topically daily. Right foot ?  ?Toujeo SoloStar 300 UNIT/ML Solostar Pen ?Generic drug: insulin glargine (1 Unit Dial) ?Inject 10 Units into the skin daily. ?  ?zolpidem 5 MG tablet ?Commonly known as: AMBIEN ?Take 5 mg by mouth at bedtime. ?  ? ?  ? ? Follow-up Information   ? ? Patwardhan, Manish J, MD Follow up in 1 week(s).   ?Specialties: Cardiology, Radiology ?Why: Keep scheduled appointment ?Contact information: ?[address] ?[address] ?[place] [address] ?[phone] ? ? ?  ?  ? ? [facility], [place] on 04/29/2021.   ?Specialty: Family Medicine ?Why: @3 :00pm  @ [facility] ? ?  ?  ? ?  ?  ? ?  ? ?Feels better, ready to go home ? ?Discharge Exam: ?Filed Weights  ? 04/25/21 0500 04/25/21 1250 04/25/21 1612  ?Weight: 70.5 kg 71.3 kg 69.3 kg  ? ?Physical Exam ?Vitals reviewed.  ?Constitutional:   ?   General: She is not in acute distress. ?   Appearance: She is not ill-appearing or toxic-appearing.  ?Cardiovascular:  ?   Rate and Rhythm: Normal rate and regular rhythm.  ?   Heart sounds: No murmur heard. ?   Comments: Telemetry SR ?Pulmonary:  ?   Effort: Pulmonary effort is normal. No respiratory distress.  ?   Breath sounds: No wheezing, rhonchi or rales.  ?Neurological:  ?   Mental Status: She is alert.  ?Psychiatric:     ?   Mood and Affect: Mood normal.     ?   Behavior: Behavior normal.  ? ?Condition at discharge: good ? ?The results of significant diagnostics from this hospitalization (including imaging, microbiology, ancillary and laboratory) are listed below for reference.  ? ?Imaging Studies: ?NM  Myocar Multi W/Spect W/Wall Motion / EF ? ?Result Date: 04/19/2021 ?CLINICAL DATA:  Chest pain. Shortness of breath. Coronary artery disease with previous stent placement. EXAM: MYOCARDIAL IMAGING WITH SPECT (REST AND PHARMACOLOGIC-STRESS) GATED LEFT VENTRICULAR WALL MOTION STUDY LEFT VENTRICULAR EJECTION FRACTION TECHNIQUE: Standard myocardial SPECT imaging was performed after resting intravenous injection of 10.3 mCi Tc-68m tetrofosmin. Subsequently, intravenous infusion of Lexiscan was performed under the supervision of the Cardiology staff. At peak effect of the drug, 28.2 mCi Tc-17m tetrofosmin was injected intravenously and standard myocardial SPECT imaging was performed. Quantitative gated imaging was also performed to evaluate left ventricular wall motion, and estimate left ventricular ejection fraction. COMPARISON:  None. FINDINGS: Perfusion: No decreased activity in the left ventricle on stress imaging to suggest reversible ischemia or infarction. Wall Motion: Normal left ventricular wall motion. No left ventricular dilation. Left Ventricular Ejection Fraction: 62 % End diastolic volume 176 ml End systolic volume 47 ml IMPRESSION: 1. No reversible  ischemia or infarction. 2. Normal left ventricular wall motion. 3. Left ventricular ejection fraction 62% 4. Non invasive risk stratification*: Low *2012 Appropriate Use Criteria for Coronary Revascularization Focused Update: J Am Coll Cardiol. 6433;29(5):188-416. http://content.airportbarriers.com.aspx?articleid=1201161 Electronically Signed   By: Marlaine Hind M.D.   On: 04/19/2021 13:23  ? ?DG Chest Portable 1 View ? ?Result Date: 04/24/2021 ?CLINICAL DATA:  Shortness of breath. EXAM: PORTABLE CHEST 1 VIEW COMPARISON:  April 15, 2021. FINDINGS: Stable cardiomegaly. Right lung is clear. Small left pleural effusion is noted. Mild left basilar subsegmental atelectasis is noted. Bony thorax is unremarkable. IMPRESSION: Mild left basilar subsegmental atelectasis  with small left pleural effusion. Electronically Signed   By: Marijo Conception M.D.   On: 04/24/2021 11:31  ? ?DG Chest Portable 1 View ? ?Result Date: 04/15/2021 ?CLINICAL DATA:  End-stage renal disease. EXAM: PORTABLE CHEST 1

## 2021-04-25 NOTE — Progress Notes (Signed)
IV site occluded. Cardizem gtt stopped. IV team on unit earlier to attempt placement of secondary IV but unable to. Will endorse to oncoming RN for follow up.  ?

## 2021-04-25 NOTE — Progress Notes (Signed)
IV team order placed. HR currently elevated. ?

## 2021-04-25 NOTE — Progress Notes (Incomplete)
PIV consult: Unable to cannulate veins that avoid previous site. Current PIV appears patent. Pt denies pain/discomfort. Message to RN recommending to maintain current site.  ?

## 2021-04-25 NOTE — Progress Notes (Addendum)
Subjective:  ?Feels well ?No complaints ? ?Telemetry reviewed, details below ? ?Objective:  ?Vital Signs in the last 24 hours: ?Temp:  [97.7 ?F (36.5 ?C)-98.1 ?F (36.7 ?C)] 98.1 ?F (36.7 ?C) (03/31 8099) ?Pulse Rate:  [74-143] 95 (03/31 0851) ?Resp:  [9-22] 18 (03/31 0717) ?BP: (83-137)/(58-84) 116/77 (03/31 8338) ?SpO2:  [90 %-100 %] 96 % (03/31 0851) ?FiO2 (%):  [100 %] 100 % (03/30 1210) ?Weight:  [70.5 kg-72 kg] 70.5 kg (03/31 0500) ? ?Intake/Output from previous day: ?03/30 0701 - 03/31 0700 ?In: 295.4 [P.O.:240; I.V.:55.4] ?Out: -  ? ?Physical Exam ?Vitals and nursing note reviewed.  ?Constitutional:   ?   General: She is not in acute distress. ?Neck:  ?   Vascular: No JVD.  ?Cardiovascular:  ?   Rate and Rhythm: Normal rate. Rhythm irregular.  ?   Heart sounds: Normal heart sounds. No murmur heard. ?Pulmonary:  ?   Effort: Pulmonary effort is normal.  ?   Breath sounds: Normal breath sounds. No wheezing or rales.  ?Musculoskeletal:  ?   Right lower leg: Edema (1+) present.  ?   Left lower leg: Edema (1+) present.  ? ?Reviewed and interpreted: ? ?Telemetry 04/25/2021: ?Afib w/rate around 100 bpm overnight, in 80s this morning ?  ?EKG 04/24/2021: ?Atrial fibrillation with RVR 126 bpm.  Diffuse ST depression, consider ischemia ?  ?Lexiscan nuclear stress test 04/19/2021: ?1. No reversible ischemia or infarction. ?2. Normal left ventricular wall motion. ?3. Left ventricular ejection fraction 62% ?4. Non invasive risk stratification*: Low ?  ?Imaging/tests reviewed and independently interpreted: ?Echocardiogram 04/18/2021: ? 1. Left ventricular ejection fraction, by estimation, is 60 to 65%. The  ?left ventricle has normal function. The left ventricle has no regional  ?wall motion abnormalities. There is moderate left ventricular hypertrophy.  ?Left ventricular diastolic function  ? could not be evaluated.  ? 2. Right ventricular systolic function is normal. The right ventricular  ?size is mildly enlarged. There is  moderately elevated pulmonary artery  ?systolic pressure. The estimated right ventricular systolic pressure is  ?25.0 mmHg.  ? 3. Left atrial size was severely dilated.  ? 4. Right atrial size was severely dilated.  ? 5. Moderate pericardial effusion. The pericardial effusion is  ?circumferential.  ? 6. The mitral valve is abnormal. Mild mitral valve regurgitation. Mild  ?mitral stenosis. The mean mitral valve gradient is 5.5 mmHg with average  ?heart rate of 115 bpm. Severe mitral annular calcification.  ? 7. Tricuspid valve regurgitation is moderate to severe.  ? 8. The aortic valve is tricuspid. There is mild calcification of the  ?aortic valve. Aortic valve regurgitation is not visualized. Aortic valve  ?sclerosis/calcification is present, without any evidence of aortic  ?stenosis.  ? 9. The inferior vena cava is dilated in size with <50% respiratory  ?variability, suggesting right atrial pressure of 15 mmHg.  ? ?Comparison(s): No prior Echocardiogram.  ? ?Conclusion(s)/Recommendation(s): Patient in afib RVR with rates up to the  ?160s during the study. Small to moderate pericardial effusion without  ?RA/RV collapse, but this may be offset by pulmonary hypertension. TV  ?inflow velocities fluctuate more with  ?heart rate than respiration. Findings communicated with Dr. Eliseo Squires.  ?   ?  ?Assessment & Recommendations: ?  ?  ?56 y.o. African-American female  with hypertension, hyperlipidemia, type 2 diabetes mellitus, ESRD on HD,  depression, anxiety, new diagnosis of paroxysmal A-fib, now back in A-fib with RVR. ?  ?Paroxysmal Afib: ?Patient has been consistently in Afib since  last night, rate is well controlled. ?Now off diltiazem drip. ?Recommend rate control approach for now until at least a few weeks of anticoagulation. ?Increased diltiazem to 240 mg daily. ?Also on labetalol for hypertension management.  ?High CHA2DS2-VASc score, continue Eliquis 5 mg twice daily. ? ?  ?Pericardial fusion: ?Moderate, posteriorly  located.  Most likely related to advance CKD.  I suspect this will improve with hemodialysis. ?  ?Cardiology will sign off. Keep 05/06/2021 appt w/Dr. Terri Skains ?From cardiac standpoint, reasonable to discharge after dialysis.  ? ? ? ?Nigel Mormon, MD ?Pager: [phone] ?Office: [phone] ? ?

## 2021-05-06 ENCOUNTER — Ambulatory Visit: Payer: Commercial Managed Care - PPO | Admitting: Cardiology

## 2021-05-06 ENCOUNTER — Encounter (HOSPITAL_COMMUNITY): Payer: Commercial Managed Care - PPO

## 2021-05-14 ENCOUNTER — Ambulatory Visit: Payer: Commercial Managed Care - PPO | Admitting: Cardiology

## 2021-05-14 ENCOUNTER — Encounter: Payer: Self-pay | Admitting: Cardiology

## 2021-05-14 VITALS — BP 148/72 | HR 81 | Temp 98.2°F | Resp 16 | Ht 66.0 in | Wt 142.4 lb

## 2021-05-14 DIAGNOSIS — I480 Paroxysmal atrial fibrillation: Secondary | ICD-10-CM

## 2021-05-14 DIAGNOSIS — I120 Hypertensive chronic kidney disease with stage 5 chronic kidney disease or end stage renal disease: Secondary | ICD-10-CM

## 2021-05-14 DIAGNOSIS — I050 Rheumatic mitral stenosis: Secondary | ICD-10-CM

## 2021-05-14 DIAGNOSIS — I361 Nonrheumatic tricuspid (valve) insufficiency: Secondary | ICD-10-CM

## 2021-05-14 DIAGNOSIS — Z794 Long term (current) use of insulin: Secondary | ICD-10-CM

## 2021-05-14 DIAGNOSIS — Z7901 Long term (current) use of anticoagulants: Secondary | ICD-10-CM

## 2021-05-14 DIAGNOSIS — E1169 Type 2 diabetes mellitus with other specified complication: Secondary | ICD-10-CM

## 2021-05-14 MED ORDER — DILTIAZEM HCL ER COATED BEADS 240 MG PO TB24: 240.0000 mg | ORAL_TABLET | Freq: Every day | ORAL | 0 refills | Status: DC

## 2021-05-14 NOTE — Progress Notes (Signed)
? ?ID:  Holly [name], DOB 07/16/1965, MRN [id number] ? ?PCP:  [place], [place]  ?Cardiologist:  Rex Kras, DO, [facility] (established care 04/18/2021) ? ?Chief Complaint  ?Patient presents with  ? Atrial Fibrillation  ? New Patient (Initial Visit)  ? Hospitalization Follow-up  ? ? ?HPI  ?Holly [name] is a 56 y.o. Caucasian female whose past medical history and cardiovascular risk factors include: Hypertension with ESRD, insulin-dependent diabetes mellitus type 2, hypertension, hyperlipidemia, anemia of chronic disease, left foot abscess status post transmetatarsal amputation, restless leg syndrome, anxiety/depression. ? ?Patient initially seen in the hospital in March 2023 when she presented with abdominal pain and lower extremity swelling.  Cardiology was consulted for A-fib with RVR management and given her multiple cardiovascular comorbidities ischemic work-up was requested.  Patient responded well to IV Cardizem drip and she was changed to oral medications.  She underwent stress test which was reported to be low risk study.  After discharge she represented to the hospital with A-fib with RVR and the dose of diltiazem was uptitrated.  She now presents for outpatient follow-up. ? ?Status post discharge patient is doing well from a cardiovascular standpoint.  She remains in normal sinus rhythm.  And is tolerating diltiazem and Eliquis well without any side effects or intolerances.  She does not endorse any evidence of bleeding. ? ?ALLERGIES: ?Allergies  ?Allergen Reactions  ? Bactrim Nausea And Vomiting  ? Byetta 10 Mcg Pen [Exenatide] Nausea And Vomiting  ? Hctz [Hydrochlorothiazide] Other (See Comments) and Itching  ?  dizziness ?dizziness  ? Spironolactone Other (See Comments)  ?  Dizziness and nausea/vomiting  ? Sulfa Antibiotics Nausea And Vomiting  ? Sulfacetamide Sodium Nausea And Vomiting  ? Sulfamethoxazole-Trimethoprim Nausea And Vomiting  ? ? ?MEDICATION LIST PRIOR TO VISIT: ?Current Meds  ?Medication  Sig  ? apixaban (ELIQUIS) 5 MG TABS tablet Take 1 tablet (5 mg total) by mouth 2 (two) times daily.  ? diltiazem (CARDIZEM LA) 240 MG 24 hr tablet Take 1 tablet (240 mg total) by mouth daily.  ? FLUoxetine (PROZAC) 40 MG capsule Take 40 mg by mouth at bedtime.  ? furosemide (LASIX) 40 MG tablet Take 80 mg by mouth as needed.  ? insulin glargine, 1 Unit Dial, (TOUJEO SOLOSTAR) 300 UNIT/ML Solostar Pen Inject 10 Units into the skin daily.  ? labetalol (NORMODYNE) 200 MG tablet Take 200 mg by mouth 2 (two) times daily.  ? leptospermum manuka honey (MEDIHONEY) PSTE paste Apply 1 application. topically daily.  ? MELATONIN GUMMIES PO Take 2 tablets by mouth at bedtime. Unsure of dose  ? mupirocin ointment (BACTROBAN) 2 % Place 1 application. into the nose 2 (two) times daily.  ? rOPINIRole (REQUIP) 1 MG tablet TAKE 1 TABLET EVERY DAY AS NEEDED (Patient taking differently: Take 1 mg by mouth at bedtime.)  ? rosuvastatin (CRESTOR) 10 MG tablet Take 1 tablet (10 mg total) by mouth at bedtime.  ? Semaglutide, 1 MG/DOSE, (OZEMPIC, 1 MG/DOSE,) 4 MG/3ML SOPN Inject 1 mg into the skin every Wednesday.  ? sevelamer carbonate (RENVELA) 800 MG tablet Take 800 mg by mouth 3 (three) times daily with meals.  ? silver sulfADIAZINE (SILVADENE) 1 % cream Apply 1 application. topically daily. Right foot  ? zolpidem (AMBIEN) 5 MG tablet Take 5 mg by mouth at bedtime.  ? [DISCONTINUED] diltiazem (CARDIZEM CD) 120 MG 24 hr capsule Take 2 capsules (240 mg total) by mouth daily.  ?  ? ?PAST MEDICAL HISTORY: ?Past Medical History:  ?Diagnosis Date  ?  Anxiety state, unspecified   ? Background diabetic retinopathy(362.01)   ? Bulimia   ? Calculus of kidney   ? Cellulitis and abscess of foot 01/24/2015  ? left foot  ? Chronic inflammatory demyelinating polyneuritis (HCC)   ? Chronic kidney disease, stage IV (severe) ([place])   ? DM2 (diabetes mellitus, type 2) ([place])   ? Dysthymic disorder   ? Esophageal reflux   ? Essential hypertension, benign   ?  Heart murmur   ? been told very slight, never given her any problems  ? History of blood transfusion X 6-7; 2010 - present (03/29/2014)  ? "related to OR; kidney issues"  ? HLD (hyperlipidemia)   ? HDL goal >50, LDL goal <100  ? Iron deficiency anemia   ? "suppose to get procrit injections q 3 wks; usually don't" do it" (03/29/2014)  ? Irritable bowel syndrome   ? Left heart failure ([place])   ? Primary pulmonary hypertension ([place])   ? not seen at CATH  (32m Hg), pt not aware of this  ? RLS (restless legs syndrome)   ? ? ?PAST SURGICAL HISTORY: ?Past Surgical History:  ?Procedure Laterality Date  ? AMPUTATION Left 01/26/2015  ? Procedure: POST TRANS MET ;  Surgeon: MNewt Minion MD;  [facility]  Service: Orthopedics;  Laterality: Left;  ? APPENDECTOMY    ? AV FISTULA PLACEMENT Right 01/08/2015  ? Procedure: ARTERIOVENOUS (AV) FISTULA CREATION;  Surgeon: CAngelia Mould MD;  [facility]  Service: Vascular;  Laterality: Right;  ? CESAREAN SECTION  2001; 1999; 1994  ? DILATION AND CURETTAGE OF UTERUS    ? EYE SURGERY    ? INCISION AND DRAINAGE OF WOUND Right 2010  ? "serious staph infection"  ? PARS PLANA VITRECTOMY Bilateral 2010  ? "related to DM"  ? TONSILLECTOMY AND ADENOIDECTOMY    ? ? ?FAMILY HISTORY: ?The patient family history includes Hypertension in her mother; Myelodysplastic syndrome in her father; Pancreatic cancer in an other family member. ? ?SOCIAL HISTORY:  ?The patient  reports that she has never smoked. She has never used smokeless tobacco. She reports that she does not currently use alcohol. She reports that she does not use drugs. ? ?REVIEW OF SYSTEMS: ?Review of Systems  ?Cardiovascular:  Negative for chest pain, dyspnea on exertion, leg swelling, orthopnea, palpitations, paroxysmal nocturnal dyspnea and syncope.  ?Respiratory:  Negative for shortness of breath.   ? ?PHYSICAL EXAM: ? ?  05/14/2021  ?  9:58 AM 04/25/2021  ?  4:12 PM 04/25/2021  ?  4:00 PM  ?Vitals with BMI  ?Height '5\' 6"'      ?Weight 142 lbs 6 oz 152 lbs 12 oz   ?BMI 22.99 24.67   ?Systolic [id number] ?Diastolic 72 66 68  ?Pulse 81    ? ? ?CONSTITUTIONAL: Appears older than stated age, hemodynamically stable, no acute distress.   ?SKIN: Skin is warm and dry. No rash noted. No cyanosis. No pallor. No jaundice ?HEAD: Normocephalic and atraumatic.  ?EYES: No scleral icterus ?MOUTH/THROAT: Moist oral membranes.  ?NECK: No JVD present. No thyromegaly noted. No carotid bruits  ?LYMPHATIC: No visible cervical adenopathy.  ?CHEST Normal respiratory effort. No intercostal retractions  ?LUNGS: Clear to auscultation bilaterally.  No stridor. No wheezes. No rales.  ?CARDIOVASCULAR: Regular, positive SJ2-I7 holosystolic murmur heard at the left lower sternal border, diastolic murmur heard over the apex, no gallops or rubs.   ?ABDOMINAL: soft, nontender, nondistended, positive bowel sounds in all 4  quadrants. ?EXTREMITIES: Hemodialysis access on the right upper extremity with good thrill, left metatarsal amputation.  Distal phalanges of the right foot eschar/ulcer.  2+ left dorsalis pedis pulse, 1+ right dorsalis pedis pulse. ?HEMATOLOGIC: No significant bruising ?NEUROLOGIC: Oriented to person, place, and time. Nonfocal. Normal muscle tone.  ?PSYCHIATRIC: Normal mood and affect. Normal behavior. Cooperative ? ?CARDIAC DATABASE: ?EKG: ?05/14/2021: Normal sinus rhythm, 81 bpm, LAE, without underlying ischemia injury pattern.   ? ?Echocardiogram: ?04/18/2021: ? 1. Left ventricular ejection fraction, by estimation, is 60 to 65%. The left ventricle has normal function. The left ventricle has no regional wall motion abnormalities. There is moderate left ventricular hypertrophy. Left ventricular diastolic function  ? could not be evaluated.  ? 2. Right ventricular systolic function is normal. The right ventricular size is mildly enlarged. There is moderately elevated pulmonary artery systolic pressure. The estimated right ventricular systolic pressure is  10.1 mmHg.  ? 3. Left atrial size was severely dilated.  ? 4. Right atrial size was severely dilated.  ? 5. Moderate pericardial effusion. The pericardial effusion is circumferential.  ? 6. The mitral

## 2021-05-29 ENCOUNTER — Other Ambulatory Visit: Payer: Self-pay

## 2021-05-29 MED ORDER — APIXABAN 5 MG PO TABS
5.0000 mg | ORAL_TABLET | Freq: Two times a day (BID) | ORAL | 3 refills | Status: DC
Start: 2021-05-29 — End: 2022-03-02

## 2021-05-31 ENCOUNTER — Other Ambulatory Visit: Payer: Self-pay | Admitting: Psychiatry

## 2021-06-10 ENCOUNTER — Other Ambulatory Visit: Payer: Self-pay

## 2021-06-10 ENCOUNTER — Emergency Department (HOSPITAL_BASED_OUTPATIENT_CLINIC_OR_DEPARTMENT_OTHER): Payer: Commercial Managed Care - PPO

## 2021-06-10 ENCOUNTER — Emergency Department (HOSPITAL_BASED_OUTPATIENT_CLINIC_OR_DEPARTMENT_OTHER)
Admission: EM | Admit: 2021-06-10 | Discharge: 2021-06-10 | Disposition: A | Discharge status: Home / Self Care | Payer: Commercial Managed Care - PPO | Attending: Emergency Medicine | Admitting: Emergency Medicine

## 2021-06-10 ENCOUNTER — Encounter (HOSPITAL_BASED_OUTPATIENT_CLINIC_OR_DEPARTMENT_OTHER): Payer: Self-pay | Admitting: Obstetrics and Gynecology

## 2021-06-10 DIAGNOSIS — Z992 Dependence on renal dialysis: Secondary | ICD-10-CM | POA: Insufficient documentation

## 2021-06-10 DIAGNOSIS — L03116 Cellulitis of left lower limb: Secondary | ICD-10-CM | POA: Insufficient documentation

## 2021-06-10 DIAGNOSIS — Z7901 Long term (current) use of anticoagulants: Secondary | ICD-10-CM | POA: Diagnosis not present

## 2021-06-10 DIAGNOSIS — E1122 Type 2 diabetes mellitus with diabetic chronic kidney disease: Secondary | ICD-10-CM | POA: Insufficient documentation

## 2021-06-10 DIAGNOSIS — Z794 Long term (current) use of insulin: Secondary | ICD-10-CM | POA: Diagnosis not present

## 2021-06-10 DIAGNOSIS — Z7984 Long term (current) use of oral hypoglycemic drugs: Secondary | ICD-10-CM | POA: Insufficient documentation

## 2021-06-10 DIAGNOSIS — N186 End stage renal disease: Secondary | ICD-10-CM | POA: Diagnosis not present

## 2021-06-10 LAB — CBC WITH DIFFERENTIAL/PLATELET
Abs Immature Granulocytes: 0.02 10*3/uL (ref 0.00–0.07)
Basophils Absolute: 0 10*3/uL (ref 0.0–0.1)
Basophils Relative: 1 %
Eosinophils Absolute: 0.4 10*3/uL (ref 0.0–0.5)
Eosinophils Relative: 6 %
HCT: 26.4 % — ABNORMAL LOW (ref 36.0–46.0)
Hemoglobin: 8.1 g/dL — ABNORMAL LOW (ref 12.0–15.0)
Immature Granulocytes: 0 %
Lymphocytes Relative: 14 %
Lymphs Abs: 0.9 10*3/uL (ref 0.7–4.0)
MCH: 28.4 pg (ref 26.0–34.0)
MCHC: 30.7 g/dL (ref 30.0–36.0)
MCV: 92.6 fL (ref 80.0–100.0)
Monocytes Absolute: 0.6 10*3/uL (ref 0.1–1.0)
Monocytes Relative: 10 %
Neutro Abs: 4.3 10*3/uL (ref 1.7–7.7)
Neutrophils Relative %: 69 %
Platelets: 202 10*3/uL (ref 150–400)
RBC: 2.85 MIL/uL — ABNORMAL LOW (ref 3.87–5.11)
RDW: 15 % (ref 11.5–15.5)
WBC: 6.3 10*3/uL (ref 4.0–10.5)
nRBC: 0 % (ref 0.0–0.2)

## 2021-06-10 LAB — COMPREHENSIVE METABOLIC PANEL
ALT: 26 U/L (ref 0–44)
AST: 19 U/L (ref 15–41)
Albumin: 4.1 g/dL (ref 3.5–5.0)
Alkaline Phosphatase: 117 U/L (ref 38–126)
Anion gap: 11 (ref 5–15)
BUN: 23 mg/dL — ABNORMAL HIGH (ref 6–20)
CO2: 31 mmol/L (ref 22–32)
Calcium: 9.7 mg/dL (ref 8.9–10.3)
Chloride: 96 mmol/L — ABNORMAL LOW (ref 98–111)
Creatinine, Ser: 3.28 mg/dL — ABNORMAL HIGH (ref 0.44–1.00)
GFR, Estimated: 16 mL/min — ABNORMAL LOW (ref >60)
Glucose, Bld: 146 mg/dL — ABNORMAL HIGH (ref 70–99)
Potassium: 3.7 mmol/L (ref 3.5–5.1)
Sodium: 138 mmol/L (ref 135–145)
Total Bilirubin: 0.5 mg/dL (ref 0.3–1.2)
Total Protein: 6.9 g/dL (ref 6.5–8.1)

## 2021-06-10 NOTE — ED Provider Notes (Signed)
?[facility] EMERGENCY DEPT ?Provider Note ? ? ?CSN: [id number] ?Arrival date & time: 06/10/21  1558 ? ?  ? ?History ? ?Chief Complaint  ?Patient presents with  ? Leg Swelling  ? ? ?Holly [name] is a 56 y.o. female. ? ?HPI ? ?Patient with complicated medical history including type 2 diabetes, end-stage renal disease on dialysis Tuesday, Thursday, Saturday, recent cellulitis presents today due to left lower extremity pain.  States she was recently diagnosed with cellulitis after getting a wound to the anterior shin.  She has been on 2 different antibiotics, she is currently on ciprofloxacin.  States she has no sensation to the left lower limb but the swelling and erythema to the leg appears to be spreading prompting today's ED visit.  Denies chest pain or shortness of breath. ? ?Home Medications ?Prior to Admission medications   ?Medication Sig Start Date End Date Taking? Authorizing Provider  ?apixaban (ELIQUIS) 5 MG TABS tablet Take 1 tablet (5 mg total) by mouth 2 (two) times daily. 05/29/21   Tolia, Sunit, DO  ?diltiazem (CARDIZEM LA) 240 MG 24 hr tablet Take 1 tablet (240 mg total) by mouth daily. 05/14/21 08/12/21  Tolia, Sunit, DO  ?FLUoxetine (PROZAC) 40 MG capsule Take 40 mg by mouth at bedtime.    [provider]  ?furosemide (LASIX) 40 MG tablet Take 80 mg by mouth as needed. 05/09/21   [provider]  ?insulin glargine, 1 Unit Dial, (TOUJEO SOLOSTAR) 300 UNIT/ML Solostar Pen Inject 10 Units into the skin daily. 03/10/21   [provider]  ?labetalol (NORMODYNE) 200 MG tablet Take 200 mg by mouth 2 (two) times daily. 04/22/21   [provider]  ?leptospermum manuka honey (MEDIHONEY) PSTE paste Apply 1 application. topically daily. 04/20/21   Geradine Girt, DO  ?MELATONIN GUMMIES PO Take 2 tablets by mouth at bedtime. Unsure of dose    [provider]  ?metolazone (ZAROXOLYN) 5 MG tablet Take 5 mg by mouth daily. ?Patient not taking: Reported on  05/14/2021 04/22/21   [provider]  ?mupirocin ointment (BACTROBAN) 2 % Place 1 application. into the nose 2 (two) times daily. 04/20/21   Geradine Girt, DO  ?rOPINIRole (REQUIP) 1 MG tablet TAKE 1 TABLET EVERY DAY AS NEEDED 06/02/21   Genia Harold, MD  ?rosuvastatin (CRESTOR) 10 MG tablet Take 1 tablet (10 mg total) by mouth at bedtime. 04/20/21   Geradine Girt, DO  ?Semaglutide, 1 MG/DOSE, (OZEMPIC, 1 MG/DOSE,) 4 MG/3ML SOPN Inject 1 mg into the skin every Wednesday. 02/20/21   [provider]  ?sevelamer carbonate (RENVELA) 800 MG tablet Take 800 mg by mouth 3 (three) times daily with meals. 11/28/20   [provider]  ?silver sulfADIAZINE (SILVADENE) 1 % cream Apply 1 application. topically daily. Right foot    [provider]  ?zolpidem (AMBIEN) 5 MG tablet Take 5 mg by mouth at bedtime.    [provider]  ?rOPINIRole (REQUIP) 1 MG tablet Take 1 tablet (1 mg total) by mouth daily as needed. 09/20/20   Mesner, Corene Cornea, MD  ?   ? ?Allergies    ?Bactrim, Byetta 10 mcg pen [exenatide], Hctz [hydrochlorothiazide], Spironolactone, Sulfa antibiotics, Sulfacetamide sodium, and Sulfamethoxazole-trimethoprim   ? ?Review of Systems   ?Review of Systems ? ?Physical Exam ?Updated Vital Signs ?BP (!) 169/71   Pulse 72   Temp 98.4 ?F (36.9 ?C)   Resp 18   Ht 5' 6.5" (1.689 m)   Wt 67 kg  LMP 07/17/2011   SpO2 100%   BMI 23.48 kg/m?  ?Physical Exam ?Vitals and nursing note reviewed. Exam conducted with a chaperone present.  ?Constitutional:   ?   General: She is not in acute distress. ?   Appearance: Normal appearance.  ?HENT:  ?   Head: Normocephalic and atraumatic.  ?Eyes:  ?   General: No scleral icterus. ?   Extraocular Movements: Extraocular movements intact.  ?   Pupils: Pupils are equal, round, and reactive to light.  ?Cardiovascular:  ?   Rate and Rhythm: Normal rate and regular rhythm.  ?   Pulses: Normal pulses.  ?Pulmonary:  ?   Effort: Pulmonary effort is  normal.  ?   Breath sounds: Normal breath sounds.  ?Skin: ?   General: Skin is warm.  ?   Coloration: Skin is not jaundiced.  ?   Findings: Erythema present.  ?Neurological:  ?   Mental Status: She is alert. Mental status is at baseline.  ?   Coordination: Coordination normal.  ? ? ? ? ? ?ED Results / Procedures / Treatments   ?Labs ?(all labs ordered are listed, but only abnormal results are displayed) ?Labs Reviewed  ?CBC WITH DIFFERENTIAL/PLATELET - Abnormal; Notable for the following components:  ?    Result Value  ? RBC 2.85 (*)   ? Hemoglobin 8.1 (*)   ? HCT 26.4 (*)   ? All other components within normal limits  ?COMPREHENSIVE METABOLIC PANEL - Abnormal; Notable for the following components:  ? Chloride 96 (*)   ? Glucose, Bld 146 (*)   ? BUN 23 (*)   ? Creatinine, Ser 3.28 (*)   ? GFR, Estimated 16 (*)   ? All other components within normal limits  ? ? ?EKG ?None ? ?Radiology ?US Venous Img Lower  Left (DVT Study) ? ?Result Date: 06/10/2021 ?CLINICAL DATA:  Left leg pain and swelling, initial encounter EXAM: LEFT LOWER EXTREMITY VENOUS DOPPLER ULTRASOUND TECHNIQUE: Gray-scale sonography with graded compression, as well as color Doppler and duplex ultrasound were performed to evaluate the lower extremity deep venous systems from the level of the common femoral vein and including the common femoral, femoral, profunda femoral, popliteal and calf veins including the posterior tibial, peroneal and gastrocnemius veins when visible. The superficial great saphenous vein was also interrogated. Spectral Doppler was utilized to evaluate flow at rest and with distal augmentation maneuvers in the common femoral, femoral and popliteal veins. COMPARISON:  None Available. FINDINGS: Contralateral Common Femoral Vein: Respiratory phasicity is normal and symmetric with the symptomatic side. No evidence of thrombus. Normal compressibility. Common Femoral Vein: No evidence of thrombus. Normal compressibility, respiratory  phasicity and response to augmentation. Saphenofemoral Junction: No evidence of thrombus. Normal compressibility and flow on color Doppler imaging. Profunda Femoral Vein: No evidence of thrombus. Normal compressibility and flow on color Doppler imaging. Femoral Vein: No evidence of thrombus. Normal compressibility, respiratory phasicity and response to augmentation. Popliteal Vein: No evidence of thrombus. Normal compressibility, respiratory phasicity and response to augmentation. Calf Veins: No evidence of thrombus. Normal compressibility and flow on color Doppler imaging. Superficial Great Saphenous Vein: No evidence of thrombus. Normal compressibility. Venous Reflux:  None. Other Findings: Prominent lymph nodes are noted in the left inguinal region likely reactive in nature related to the peripheral inflammatory change IMPRESSION: No evidence of deep venous thrombosis. Electronically Signed   By: Inez Catalina M.D.   On: 06/10/2021 19:43   ? ?Procedures ?Procedures  ? ? ?Medications Ordered in  ED ?Medications - No data to display ? ?ED Course/ Medical Decision Making/ A&P ?  ?                        ?Medical Decision Making ?Amount and/or Complexity of Data Reviewed ?Labs: ordered. ? ? ?Patient presents with left lower extremity erythema and swelling.  She is neurovascular intact, DP is palpable and 2+.  Her lower extremity is warm and erythematous, wound appears to be well-healing and there is no underlying induration or fluctuance.  Patient is not febrile or tachycardic, does not meet SIRS criteria. ? ?I ordered and reviewed labs.  Notably there is no leukocytosis, creatinine slightly elevated but given she is end-stage renal disease not grossly so.  There is no gross electrolyte derangement and her potassium is within normal limits. ? ?DVT study obtained, no evidence of underlying blood clot. ? ?Patient is not septic.  Good ROM.  Given no leukocytosis, no DVT and I do not think she is septic or in SIRS criteria  I think is reasonable to continue oral antibiotics outpatient for strict return precautions.  Discussed with patient who verbalized understanding and agreement with the plan. ? ?Discussed HPI, physical exam and plan of care for

## 2021-06-10 NOTE — Discharge Instructions (Addendum)
Continue taking the ciprofloxacin.  Follow-up with your primary care doctor in the next few days for wound recheck.  work-up today was reassuring, no signs of a blood infection but you do have a skin infection called cellulitis.  If the redness continues to spread return back to the ED for wound recheck. ?

## 2021-06-10 NOTE — ED Triage Notes (Signed)
Patient reports to the ER for leg swelling and redness to the left leg. Patient reports she cannot feel the leg due to her comorbidities but she recently had cellulitis and is concerned as a new area had come up higher up. She has no toes on the left foot and does not want to risk losing her leg.  ?Dialysis days T, Th, Sat ?>3L taken off today at dialysis.  ?

## 2021-07-22 ENCOUNTER — Telehealth: Payer: Self-pay | Admitting: Psychiatry

## 2021-07-22 ENCOUNTER — Other Ambulatory Visit: Payer: Self-pay | Admitting: Psychiatry

## 2021-07-22 MED ORDER — ROPINIROLE HCL 1 MG PO TABS: 1.5000 mg | ORAL_TABLET | Freq: Every day | ORAL | 6 refills | Status: DC

## 2021-07-22 NOTE — Telephone Encounter (Signed)
Pt asking if can increase Ropinirole, during dialysis RLS acts up. Would like a call from the nurse.

## 2021-08-14 ENCOUNTER — Other Ambulatory Visit: Payer: Self-pay

## 2021-08-14 ENCOUNTER — Emergency Department (HOSPITAL_COMMUNITY): Payer: Commercial Managed Care - PPO

## 2021-08-14 ENCOUNTER — Emergency Department (HOSPITAL_COMMUNITY)
Admission: EM | Admit: 2021-08-14 | Discharge: 2021-08-14 | Discharge status: Against Medical Advice | Payer: Commercial Managed Care - PPO | Attending: Emergency Medicine | Admitting: Emergency Medicine

## 2021-08-14 DIAGNOSIS — S99921A Unspecified injury of right foot, initial encounter: Secondary | ICD-10-CM | POA: Diagnosis present

## 2021-08-14 DIAGNOSIS — Z5321 Procedure and treatment not carried out due to patient leaving prior to being seen by health care provider: Secondary | ICD-10-CM | POA: Diagnosis not present

## 2021-08-14 DIAGNOSIS — M79671 Pain in right foot: Secondary | ICD-10-CM | POA: Insufficient documentation

## 2021-08-14 DIAGNOSIS — X58XXXA Exposure to other specified factors, initial encounter: Secondary | ICD-10-CM | POA: Insufficient documentation

## 2021-08-14 DIAGNOSIS — S9031XA Contusion of right foot, initial encounter: Secondary | ICD-10-CM | POA: Insufficient documentation

## 2021-08-14 MED ORDER — OXYCODONE-ACETAMINOPHEN 5-325 MG PO TABS: 1.0000 | ORAL_TABLET | Freq: Once | ORAL | Status: DC

## 2021-08-14 NOTE — ED Provider Triage Note (Signed)
  Emergency Medicine Provider Triage Evaluation Note  MRN:  [id number]  Arrival date & time: 08/14/21    Medically screening exam initiated at 2:21 AM.   CC:   Foot pain  HPI:  Holly [name] is a 56 y.o. year-old female presents to the ED with chief complaint of foot pain and ankle pain.  No known injury.  Has neuropathy.  Noticed bruising and deformity today.  History provided by patient. ROS:  -As included in HPI PE:   Vitals:   08/14/21 0145  BP: (!) 162/73  Pulse: 75  Resp: 16  Temp: 98.1 F (36.7 C)  SpO2: 97%    Non-toxic appearing No respiratory distress Bruising and midfoot swelling to right foot, intact pulses, normal cap refill MDM:  Based on signs and symptoms, fx is highest on my differential, followed by sprain. I've ordered imaging in triage to expedite lab/diagnostic workup.  Patient was informed that the remainder of the evaluation will be completed by another provider, this initial triage assessment does not replace that evaluation, and the importance of remaining in the ED until their evaluation is complete.    Montine Circle, PA-C 08/14/21 [phone]

## 2021-08-14 NOTE — ED Notes (Signed)
Pt cannot be found and is not answering when [facility] calls

## 2021-08-14 NOTE — ED Triage Notes (Signed)
Pt here for R foot pain that started today. Pt noticed bruising and now has foot deformity. Pt denies injury.

## 2021-08-20 ENCOUNTER — Other Ambulatory Visit: Payer: Self-pay | Admitting: Internal Medicine

## 2021-08-20 ENCOUNTER — Other Ambulatory Visit: Payer: Self-pay | Admitting: Nephrology

## 2021-08-20 DIAGNOSIS — R2242 Localized swelling, mass and lump, left lower limb: Secondary | ICD-10-CM

## 2021-08-22 ENCOUNTER — Other Ambulatory Visit: Payer: Commercial Managed Care - PPO

## 2021-09-17 ENCOUNTER — Emergency Department (HOSPITAL_COMMUNITY): Payer: Commercial Managed Care - PPO

## 2021-09-17 ENCOUNTER — Emergency Department (HOSPITAL_COMMUNITY)
Admission: EM | Admit: 2021-09-17 | Discharge: 2021-09-17 | Disposition: A | Discharge status: Home / Self Care | Payer: Commercial Managed Care - PPO | Attending: Emergency Medicine | Admitting: Emergency Medicine

## 2021-09-17 ENCOUNTER — Encounter (HOSPITAL_COMMUNITY): Payer: Self-pay

## 2021-09-17 DIAGNOSIS — Z79899 Other long term (current) drug therapy: Secondary | ICD-10-CM | POA: Diagnosis not present

## 2021-09-17 DIAGNOSIS — Y93G3 Activity, cooking and baking: Secondary | ICD-10-CM | POA: Insufficient documentation

## 2021-09-17 DIAGNOSIS — I509 Heart failure, unspecified: Secondary | ICD-10-CM | POA: Insufficient documentation

## 2021-09-17 DIAGNOSIS — W268XXA Contact with other sharp object(s), not elsewhere classified, initial encounter: Secondary | ICD-10-CM | POA: Diagnosis not present

## 2021-09-17 DIAGNOSIS — Z794 Long term (current) use of insulin: Secondary | ICD-10-CM | POA: Diagnosis not present

## 2021-09-17 DIAGNOSIS — N184 Chronic kidney disease, stage 4 (severe): Secondary | ICD-10-CM | POA: Diagnosis not present

## 2021-09-17 DIAGNOSIS — S61212A Laceration without foreign body of right middle finger without damage to nail, initial encounter: Secondary | ICD-10-CM | POA: Insufficient documentation

## 2021-09-17 DIAGNOSIS — L089 Local infection of the skin and subcutaneous tissue, unspecified: Secondary | ICD-10-CM

## 2021-09-17 DIAGNOSIS — I130 Hypertensive heart and chronic kidney disease with heart failure and stage 1 through stage 4 chronic kidney disease, or unspecified chronic kidney disease: Secondary | ICD-10-CM | POA: Insufficient documentation

## 2021-09-17 DIAGNOSIS — Z7901 Long term (current) use of anticoagulants: Secondary | ICD-10-CM | POA: Insufficient documentation

## 2021-09-17 DIAGNOSIS — S6991XA Unspecified injury of right wrist, hand and finger(s), initial encounter: Secondary | ICD-10-CM | POA: Diagnosis present

## 2021-09-17 DIAGNOSIS — E11319 Type 2 diabetes mellitus with unspecified diabetic retinopathy without macular edema: Secondary | ICD-10-CM | POA: Diagnosis not present

## 2021-09-17 LAB — C-REACTIVE PROTEIN: CRP: 7.2 mg/dL — ABNORMAL HIGH (ref <1.0)

## 2021-09-17 LAB — CBC WITH DIFFERENTIAL/PLATELET
Abs Immature Granulocytes: 0.02 10*3/uL (ref 0.00–0.07)
Basophils Absolute: 0 10*3/uL (ref 0.0–0.1)
Basophils Relative: 0 %
Eosinophils Absolute: 0.1 10*3/uL (ref 0.0–0.5)
Eosinophils Relative: 1 %
HCT: 36.1 % (ref 36.0–46.0)
Hemoglobin: 11.1 g/dL — ABNORMAL LOW (ref 12.0–15.0)
Immature Granulocytes: 0 %
Lymphocytes Relative: 3 %
Lymphs Abs: 0.3 10*3/uL — ABNORMAL LOW (ref 0.7–4.0)
MCH: 27.8 pg (ref 26.0–34.0)
MCHC: 30.7 g/dL (ref 30.0–36.0)
MCV: 90.3 fL (ref 80.0–100.0)
Monocytes Absolute: 0.9 10*3/uL (ref 0.1–1.0)
Monocytes Relative: 9 %
Neutro Abs: 8.9 10*3/uL — ABNORMAL HIGH (ref 1.7–7.7)
Neutrophils Relative %: 87 %
Platelets: 138 10*3/uL — ABNORMAL LOW (ref 150–400)
RBC: 4 MIL/uL (ref 3.87–5.11)
RDW: 16.7 % — ABNORMAL HIGH (ref 11.5–15.5)
WBC: 10.2 10*3/uL (ref 4.0–10.5)
nRBC: 0 % (ref 0.0–0.2)

## 2021-09-17 LAB — BASIC METABOLIC PANEL
Anion gap: 15 (ref 5–15)
BUN: 58 mg/dL — ABNORMAL HIGH (ref 6–20)
CO2: 22 mmol/L (ref 22–32)
Calcium: 9.7 mg/dL (ref 8.9–10.3)
Chloride: 95 mmol/L — ABNORMAL LOW (ref 98–111)
Creatinine, Ser: 7.03 mg/dL — ABNORMAL HIGH (ref 0.44–1.00)
GFR, Estimated: 6 mL/min — ABNORMAL LOW (ref >60)
Glucose, Bld: 326 mg/dL — ABNORMAL HIGH (ref 70–99)
Potassium: 4.7 mmol/L (ref 3.5–5.1)
Sodium: 132 mmol/L — ABNORMAL LOW (ref 135–145)

## 2021-09-17 LAB — SEDIMENTATION RATE: Sed Rate: 15 mm/hr (ref 0–22)

## 2021-09-17 MED ORDER — AMOXICILLIN-POT CLAVULANATE 875-125 MG PO TABS: 1.0000 | ORAL_TABLET | Freq: Two times a day (BID) | ORAL | 0 refills | Status: DC

## 2021-09-17 MED ORDER — AMOXICILLIN-POT CLAVULANATE 875-125 MG PO TABS
1.0000 | ORAL_TABLET | Freq: Once | ORAL | Status: AC
  Administered 2021-09-17: 1 via ORAL
  Filled 2021-09-17: qty 1

## 2021-09-17 NOTE — ED Notes (Signed)
Pt states she has an appointment tomorrow with her primary care in regards to her HTN.

## 2021-09-17 NOTE — Discharge Instructions (Signed)
Your exam today was concerning for a wound infection of your right middle finger.  However it does not appear to have spread further up the finger at this point.  We have started you on antibiotics.  You received your first dose in the emergency room.  Your kidney function was also significantly elevated compared to the baseline we have from 3 months ago.  This could be due to receiving A session yesterday.  Please ensure that you get your scheduled dialysis session tomorrow.  Follow-up with your nephrologist.  Follow-up with your primary care doctor regarding the finger infection no later than Friday.  If you develop fever, significant worsening of redness of your finger, worsening pain, inability to move your finger or fully extend please return to the emergency room for evaluation as this is concerning for a serious infection that required surgery.

## 2021-09-17 NOTE — ED Provider Notes (Signed)
Doubt deep space infection, tenosynovitis at this time.  Able to flex extend.  Pain primarily around wound at dip.  Will start abx, close follow up in next 24-48 hours to be rechecked.   Dorie Rank, MD 09/17/21 2104

## 2021-09-17 NOTE — ED Triage Notes (Signed)
Pt arrived via POV, c/o laceration to finger of right hand several days ago. Last tetanus unknown.

## 2021-09-17 NOTE — ED Notes (Signed)
Blue top has been collected and sent down to lab.

## 2021-09-17 NOTE — ED Provider Notes (Signed)
[facility] DEPT Provider Note   CSN: [id number] Arrival date & time: 09/17/21  1844     History  Chief Complaint  Patient presents with   Laceration    Holly [name] is a 56 y.o. female.  56 year old female presents today for evaluation following a laceration that occurred about 3 to 4 days ago.  She states she was cutting vegetables when this happened.  States she is on a blood thinner and it took about a couple days for it to completely stop bleeding.  States over the past couple days she has had worsening pain and swelling.  She denies fever, chills.  She states she has history of toe amputation and does not want this to progress to that point.  States she did not present earlier because she thought she could manage this at home.  Unknown last tetanus shot.  The history is provided by the patient. No language interpreter was used.  Laceration Associated symptoms: no fever        Home Medications Prior to Admission medications   Medication Sig Start Date End Date Taking? Authorizing Provider  apixaban (ELIQUIS) 5 MG TABS tablet Take 1 tablet (5 mg total) by mouth 2 (two) times daily. 05/29/21   Tolia, Sunit, DO  diltiazem (CARDIZEM LA) 240 MG 24 hr tablet Take 1 tablet (240 mg total) by mouth daily. 05/14/21 08/12/21  Tolia, Sunit, DO  FLUoxetine (PROZAC) 40 MG capsule Take 40 mg by mouth at bedtime.    [provider]  furosemide (LASIX) 40 MG tablet Take 80 mg by mouth as needed. 05/09/21   [provider]  insulin glargine, 1 Unit Dial, (TOUJEO SOLOSTAR) 300 UNIT/ML Solostar Pen Inject 10 Units into the skin daily. 03/10/21   [provider]  labetalol (NORMODYNE) 200 MG tablet Take 200 mg by mouth 2 (two) times daily. 04/22/21   [provider]  leptospermum manuka honey (MEDIHONEY) PSTE paste Apply 1 application. topically daily. 04/20/21   Eulogio Bear U, DO  MELATONIN GUMMIES PO Take 2 tablets by mouth at  bedtime. Unsure of dose    [provider]  metolazone (ZAROXOLYN) 5 MG tablet Take 5 mg by mouth daily. Patient not taking: Reported on 05/14/2021 04/22/21   [provider]  mupirocin ointment (BACTROBAN) 2 % Place 1 application. into the nose 2 (two) times daily. 04/20/21   Geradine Girt, DO  rOPINIRole (REQUIP) 1 MG tablet Take 1.5 tablets (1.5 mg total) by mouth at bedtime. 07/22/21   Genia Harold, MD  rosuvastatin (CRESTOR) 10 MG tablet Take 1 tablet (10 mg total) by mouth at bedtime. 04/20/21   Geradine Girt, DO  Semaglutide, 1 MG/DOSE, (OZEMPIC, 1 MG/DOSE,) 4 MG/3ML SOPN Inject 1 mg into the skin every Wednesday. 02/20/21   [provider]  sevelamer carbonate (RENVELA) 800 MG tablet Take 800 mg by mouth 3 (three) times daily with meals. 11/28/20   [provider]  silver sulfADIAZINE (SILVADENE) 1 % cream Apply 1 application. topically daily. Right foot    [provider]  zolpidem (AMBIEN) 5 MG tablet Take 5 mg by mouth at bedtime.    [provider]  rOPINIRole (REQUIP) 1 MG tablet Take 1 tablet (1 mg total) by mouth daily as needed. 09/20/20   Mesner, Corene Cornea, MD      Allergies    Bactrim, Byetta 10 mcg pen [exenatide], Hctz [hydrochlorothiazide], Spironolactone, Sulfa antibiotics, Sulfacetamide sodium, and Sulfamethoxazole-trimethoprim    Review of Systems  Review of Systems  Constitutional:  Negative for fever.  Skin:  Positive for wound.  All other systems reviewed and are negative.   Physical Exam Updated Vital Signs BP (!) 173/83 (BP Location: Left Arm)   Pulse 91   Temp 99.6 F (37.6 C) (Oral)   Resp 18   LMP 07/17/2011   SpO2 92%  Physical Exam Vitals and nursing note reviewed.  Constitutional:      General: She is not in acute distress.    Appearance: Normal appearance. She is not ill-appearing.  HENT:     Head: Normocephalic and atraumatic.     Nose: Nose normal.  Eyes:     Conjunctiva/sclera:  Conjunctivae normal.  Pulmonary:     Effort: Pulmonary effort is normal. No respiratory distress.  Musculoskeletal:        General: No deformity.  Skin:    Findings: No rash.     Comments: Wound noted to the tip of right middle finger.  No active bleeding.  Surrounding erythema, swelling present.  Tenderness to palpation towards the distal digit present.  Patient is able to fully extend the affected digit.  Unable to flex at the DIP joint secondary to swelling and pain.  Flexor tendon sheath without tenderness to palpation.  Neurological:     Mental Status: She is alert.     ED Results / Procedures / Treatments   Labs (all labs ordered are listed, but only abnormal results are displayed) Labs Reviewed  CBC WITH DIFFERENTIAL/PLATELET  BASIC METABOLIC PANEL  SEDIMENTATION RATE  C-REACTIVE PROTEIN    EKG None  Radiology DG Hand Complete Right  Result Date: 09/17/2021 CLINICAL DATA:  Right middle finger wound and swelling. EXAM: RIGHT HAND - COMPLETE 3+ VIEW COMPARISON:  None Available. FINDINGS: There is no acute fracture or dislocation. No significant arthritic changes. There is diffuse vascular calcification. Laceration of the skin of the tip of the middle finger. No radiopaque foreign object or soft tissue gas. IMPRESSION: 1. No acute osseous pathology. 2. Laceration of the skin of the tip of the middle finger. Electronically Signed   By: Anner Crete M.D.   On: 09/17/2021 19:56    Procedures Procedures    Medications Ordered in ED Medications - No data to display  ED Course/ Medical Decision Making/ A&P                           Medical Decision Making Amount and/or Complexity of Data Reviewed Labs: ordered. Radiology: ordered.   Medical Decision Making / ED Course   This patient presents to the ED for concern of laceration to right middle finger, this involves an extensive number of treatment options, and is a complaint that carries with it a high risk of  complications and morbidity.  The differential diagnosis includes laceration, wound infection, flexor tenosynovitis  MDM: 56 year old female with past medical history significant for diabetes, end-stage renal disease on dialysis presents today for evaluation of laceration that occurred on Sunday.  Denies fever, chills.  Bleeding is controlled.  Has surrounding erythema, swelling isolated to distal end of the digit.  Patient is able to fully extend all digits of the right hand including the right middle finger, she is able to flex the right middle finger with the exception of the DIP, without pain with passive extension of the right middle finger, swelling localized to the distal right middle finger.  Without tenderness to palpation of the left of the  flexor tendon sheath.  Does not meet criteria for flexor tenosynovitis.  Patient also evaluated by Dr. Tomi Bamberger who is in agreement.  We will start patient on Augmentin.  First dose given in the emergency room.  Discussed follow-up with PCP no later than in 48 hours.  Patient also has elevated creatinine.  Most recent to compare this to was 3 months ago.  At that point in time it was 3.28.  Patient has ranged from the mid threes-10 in the past year.  She states she only received half a dialysis session yesterday.  She is without shortness of breath, acute electrolyte derangements.  Has mild baseline bilateral pitting edema.  Has dialysis session scheduled for tomorrow.  No need for admission, or urgent dialysis.  Patient will follow-up to have her routine dialysis session tomorrow.  Discussed follow-up with nephrologist.  Patient voices understanding.  Discussed return precautions.  Patient voices understanding and is in agreement with plan.   Additional history obtained: -Additional history obtained from labs -External records from outside source obtained and reviewed including: Chart review including previous notes, labs, imaging, consultation notes   Lab  Tests: -I ordered, reviewed, and interpreted labs.   The pertinent results include:   Labs Reviewed  CBC WITH DIFFERENTIAL/PLATELET  BASIC METABOLIC PANEL  SEDIMENTATION RATE  C-REACTIVE PROTEIN      EKG  EKG Interpretation  Date/Time:    Ventricular Rate:    PR Interval:    QRS Duration:   QT Interval:    QTC Calculation:   R Axis:     Text Interpretation:           Imaging Studies ordered: I ordered imaging studies including hand x-ray I independently visualized and interpreted imaging. I agree with the radiologist interpretation   Medicines ordered and prescription drug management: No orders of the defined types were placed in this encounter.   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[place])    Chronic kidney disease, stage IV (severe) (H[facility])    DM2 (diabetes mellitus, type 2) ([place])    Dysthymic disorder    Esophageal reflux    Essential hypertension, benign    Heart murmur    been told very slight, never given her any problems   History of blood transfusion X 6-7; 2010 - present (03/29/2014)   "related to OR; kidney issues"   HLD (hyperlipidemia)    HDL goal >50, LDL goal <100   Iron deficiency anemia    "suppose to get procrit injections q 3 wks; usually don't" do it" (03/29/2014)   Irritable bowel syndrome    Left heart failure ([place])    Primary pulmonary hypertension ([place])    not seen at CATH  (38mm Hg), pt not aware of this   RLS (restless legs syndrome)       Dispostion: Patient is  appropriate for discharge.  Discharged in stable condition.  Return precautions discussed.  Final Clinical Impression(s) / ED Diagnoses Final diagnoses:  Wound infection  Laceration of right middle finger without damage to  nail, foreign body presence unspecified, initial encounter    Rx / DC Orders ED Discharge Orders          Ordered    amoxicillin-clavulanate (AUGMENTIN) 875-125 MG tablet  Every 12 hours        09/17/21 2152              Evlyn Courier, PA-C 09/17/21 2153    Dorie Rank, MD 09/17/21 2236

## 2021-10-12 ENCOUNTER — Other Ambulatory Visit: Payer: Self-pay | Admitting: Cardiology

## 2021-10-12 DIAGNOSIS — I480 Paroxysmal atrial fibrillation: Secondary | ICD-10-CM

## 2021-10-12 DIAGNOSIS — Z7901 Long term (current) use of anticoagulants: Secondary | ICD-10-CM

## 2021-10-25 ENCOUNTER — Encounter (HOSPITAL_COMMUNITY): Payer: Self-pay | Admitting: Emergency Medicine

## 2021-10-25 ENCOUNTER — Emergency Department (HOSPITAL_COMMUNITY)
Admission: EM | Admit: 2021-10-25 | Discharge: 2021-10-25 | Disposition: A | Discharge status: Home / Self Care | Payer: Commercial Managed Care - PPO | Attending: Emergency Medicine | Admitting: Emergency Medicine

## 2021-10-25 DIAGNOSIS — S65514A Laceration of blood vessel of right ring finger, initial encounter: Secondary | ICD-10-CM | POA: Diagnosis not present

## 2021-10-25 DIAGNOSIS — I120 Hypertensive chronic kidney disease with stage 5 chronic kidney disease or end stage renal disease: Secondary | ICD-10-CM | POA: Diagnosis not present

## 2021-10-25 DIAGNOSIS — N186 End stage renal disease: Secondary | ICD-10-CM | POA: Insufficient documentation

## 2021-10-25 DIAGNOSIS — W268XXA Contact with other sharp object(s), not elsewhere classified, initial encounter: Secondary | ICD-10-CM | POA: Diagnosis not present

## 2021-10-25 DIAGNOSIS — Z992 Dependence on renal dialysis: Secondary | ICD-10-CM | POA: Insufficient documentation

## 2021-10-25 DIAGNOSIS — Z7901 Long term (current) use of anticoagulants: Secondary | ICD-10-CM | POA: Insufficient documentation

## 2021-10-25 DIAGNOSIS — Z23 Encounter for immunization: Secondary | ICD-10-CM | POA: Insufficient documentation

## 2021-10-25 DIAGNOSIS — E1122 Type 2 diabetes mellitus with diabetic chronic kidney disease: Secondary | ICD-10-CM | POA: Diagnosis not present

## 2021-10-25 DIAGNOSIS — Z794 Long term (current) use of insulin: Secondary | ICD-10-CM | POA: Diagnosis not present

## 2021-10-25 DIAGNOSIS — Z79899 Other long term (current) drug therapy: Secondary | ICD-10-CM | POA: Diagnosis not present

## 2021-10-25 DIAGNOSIS — S6991XA Unspecified injury of right wrist, hand and finger(s), initial encounter: Secondary | ICD-10-CM | POA: Diagnosis present

## 2021-10-25 MED ORDER — TETANUS-DIPHTH-ACELL PERTUSSIS 5-2.5-18.5 LF-MCG/0.5 IM SUSY
0.5000 mL | PREFILLED_SYRINGE | Freq: Once | INTRAMUSCULAR | Status: AC
  Administered 2021-10-25: 0.5 mL via INTRAMUSCULAR
  Filled 2021-10-25: qty 0.5

## 2021-10-25 NOTE — ED Provider Notes (Signed)
[facility] DEPT Provider Note   CSN: [id number] Arrival date & time: 10/25/21  1706     History PMH: ESRD on dialysis, DM2, HLD, HTN Chief Complaint  Patient presents with   Laceration    Holly [name] is a 56 y.o. female.  Patient presents with a slice laceration to her right ring finger tip.  She was seen by urgent care but due to the bleeding not stopping, she was sent to the emergency department.  She is on blood thinners.  She sustained this injury this morning.   Laceration      Home Medications Prior to Admission medications   Medication Sig Start Date End Date Taking? Authorizing Provider  amoxicillin-clavulanate (AUGMENTIN) 875-125 MG tablet Take 1 tablet by mouth every 12 (twelve) hours. 09/17/21   Evlyn Courier, PA-C  apixaban (ELIQUIS) 5 MG TABS tablet Take 1 tablet (5 mg total) by mouth 2 (two) times daily. 05/29/21   Tolia, Sunit, DO  diltiazem (CARDIZEM LA) 240 MG 24 hr tablet TAKE 1 TABLET BY MOUTH EVERY DAY 10/13/21   Tolia, Sunit, DO  FLUoxetine (PROZAC) 40 MG capsule Take 40 mg by mouth at bedtime.    [provider]  furosemide (LASIX) 40 MG tablet Take 80 mg by mouth as needed. 05/09/21   [provider]  insulin glargine, 1 Unit Dial, (TOUJEO SOLOSTAR) 300 UNIT/ML Solostar Pen Inject 10 Units into the skin daily. 03/10/21   [provider]  labetalol (NORMODYNE) 200 MG tablet Take 200 mg by mouth 2 (two) times daily. 04/22/21   [provider]  leptospermum manuka honey (MEDIHONEY) PSTE paste Apply 1 application. topically daily. 04/20/21   Eulogio Bear U, DO  MELATONIN GUMMIES PO Take 2 tablets by mouth at bedtime. Unsure of dose    [provider]  metolazone (ZAROXOLYN) 5 MG tablet Take 5 mg by mouth daily. Patient not taking: Reported on 05/14/2021 04/22/21   [provider]  mupirocin ointment (BACTROBAN) 2 % Place 1 application. into the nose 2 (two) times daily. 04/20/21    Geradine Girt, DO  rOPINIRole (REQUIP) 1 MG tablet Take 1.5 tablets (1.5 mg total) by mouth at bedtime. 07/22/21   Genia Harold, MD  rosuvastatin (CRESTOR) 10 MG tablet Take 1 tablet (10 mg total) by mouth at bedtime. 04/20/21   Geradine Girt, DO  Semaglutide, 1 MG/DOSE, (OZEMPIC, 1 MG/DOSE,) 4 MG/3ML SOPN Inject 1 mg into the skin every Wednesday. 02/20/21   [provider]  sevelamer carbonate (RENVELA) 800 MG tablet Take 800 mg by mouth 3 (three) times daily with meals. 11/28/20   [provider]  silver sulfADIAZINE (SILVADENE) 1 % cream Apply 1 application. topically daily. Right foot    [provider]  zolpidem (AMBIEN) 5 MG tablet Take 5 mg by mouth at bedtime.    [provider]  rOPINIRole (REQUIP) 1 MG tablet Take 1 tablet (1 mg total) by mouth daily as needed. 09/20/20   Mesner, Corene Cornea, MD      Allergies    Bactrim, Byetta 10 mcg pen [exenatide], Hctz [hydrochlorothiazide], Spironolactone, Sulfa antibiotics, Sulfacetamide sodium, and Sulfamethoxazole-trimethoprim    Review of Systems   Review of Systems  Skin:  Positive for wound.  All other systems reviewed and are negative.   Physical Exam Updated Vital Signs BP (!) 178/85   Pulse 84   Temp 98.2 F (36.8 C) (Oral)   Resp 18   Ht 5\' 6"  (1.676 m)  Wt 65.8 kg   LMP 07/17/2011   SpO2 95%   BMI 23.40 kg/m  Physical Exam Vitals and nursing note reviewed.  Constitutional:      General: She is not in acute distress.    Appearance: Normal appearance. She is well-developed. She is not ill-appearing, toxic-appearing or diaphoretic.  HENT:     Head: Normocephalic and atraumatic.     Nose: No nasal deformity.     Mouth/Throat:     Lips: Pink. No lesions.  Eyes:     General: Gaze aligned appropriately. No scleral icterus.       Right eye: No discharge.        Left eye: No discharge.     Conjunctiva/sclera: Conjunctivae normal.     Right eye: Right conjunctiva is not injected. No  exudate or hemorrhage.    Left eye: Left conjunctiva is not injected. No exudate or hemorrhage. Pulmonary:     Effort: Pulmonary effort is normal. No respiratory distress.  Musculoskeletal:     Comments: Right upper arm fistula  Skin:    General: Skin is warm and dry.     Comments: Slice injury to distal right ring fingertip. No nail involvement. Slowly oozing. Capillary refill intact.  Neurological:     Mental Status: She is alert and oriented to person, place, and time.  Psychiatric:        Mood and Affect: Mood normal.        Speech: Speech normal.        Behavior: Behavior normal. Behavior is cooperative.     ED Results / Procedures / Treatments   Labs (all labs ordered are listed, but only abnormal results are displayed) Labs Reviewed - No data to display  EKG None  Radiology No results found.  Procedures Procedures   Medications Ordered in ED Medications  Tdap (BOOSTRIX) injection 0.5 mL (0.5 mLs Intramuscular Given 10/25/21 1758)    ED Course/ Medical Decision Making/ A&P                           Medical Decision Making Risk Prescription drug management.   Patient here with slice laceration to right ring finger tip without any nail bed involvement. We were able to get this to stop with pressure dressing, combat gauze and coban. The wound has been cleansed. She has been observed for thirty minutes without any recurrence of bleeding.  Stable for discharge  Final Clinical Impression(s) / ED Diagnoses Final diagnoses:  Laceration of blood vessel of right ring finger, initial encounter    Rx / DC Orders ED Discharge Orders     None         Adolphus Birchwood, PA-C 10/25/21 1828    Valarie Merino, MD 10/25/21 [phone]

## 2021-10-25 NOTE — Discharge Instructions (Signed)
Please keep pressure dressing on for at least 24 hours before removing. Keep wound clean as able. Please return if bleeding returns or if you develop infectious symptoms.

## 2021-10-25 NOTE — ED Triage Notes (Signed)
Pt here from [facility] , she cut her ring finger on her right hand , pt had it looked at [facility] but it still is bleeding , pt is on bleed thinners for afib

## 2021-10-25 NOTE — ED Notes (Signed)
Lsc to finger irrigated and rewrapped using combat gauze and coban

## 2021-11-11 ENCOUNTER — Ambulatory Visit: Payer: Commercial Managed Care - PPO | Admitting: Cardiology

## 2021-12-10 ENCOUNTER — Encounter: Payer: Self-pay | Admitting: Neurology

## 2021-12-10 ENCOUNTER — Ambulatory Visit: Payer: Commercial Managed Care - PPO | Admitting: Psychiatry

## 2021-12-10 NOTE — Progress Notes (Deleted)
   CC:  RLS  Follow-up Visit  Last visit: 12/10/20  Brief HPI: 57 year old female with a history of DM, CKD ***, HTN who follows in clinic for restless leg syndrome.  At her last visit she was continued on ropinirole 1 mg QHS with optional 0.5-1 pill as needed.  Interval History: ***   Physical Exam:   Vital Signs: LMP 07/17/2011  GENERAL:  well appearing, in no acute distress, alert  SKIN:  Color, texture, turgor normal. No rashes or lesions HEAD:  Normocephalic/atraumatic. RESP: normal respiratory effort MSK:  No gross joint deformities.   NEUROLOGICAL: Mental Status: Alert, oriented to person, place and time, Follows commands, and Speech fluent and appropriate. Cranial Nerves: PERRL, face symmetric, no dysarthria, hearing grossly intact Motor: moves all extremities equally Gait: normal-based.  IMPRESSION: ***  PLAN: *** -may increase ropinirole up to 4 mg/day*** -consider gabapentin 300 mg qhs***  Follow-up: ***  I spent a total of *** minutes on the date of the service. Discussed medication side effects, adverse reactions and drug interactions. Written educational materials and patient instructions outlining all of the above were given.  Genia Harold, MD

## 2021-12-25 DIAGNOSIS — Z0289 Encounter for other administrative examinations: Secondary | ICD-10-CM

## 2022-01-26 HISTORY — PX: FOOT SURGERY: SHX648

## 2022-01-29 ENCOUNTER — Emergency Department (HOSPITAL_COMMUNITY)
Admission: EM | Admit: 2022-01-29 | Discharge: 2022-01-29 | Discharge status: Against Medical Advice | Payer: Commercial Managed Care - PPO | Attending: Emergency Medicine | Admitting: Emergency Medicine

## 2022-01-29 DIAGNOSIS — Z5321 Procedure and treatment not carried out due to patient leaving prior to being seen by health care provider: Secondary | ICD-10-CM | POA: Diagnosis not present

## 2022-02-19 ENCOUNTER — Telehealth: Payer: Self-pay | Admitting: Psychiatry

## 2022-02-19 MED ORDER — ROPINIROLE HCL 1 MG PO TABS: 1.5000 mg | ORAL_TABLET | Freq: Every day | ORAL | 0 refills | Status: DC

## 2022-02-19 NOTE — Telephone Encounter (Signed)
Pt is needing a refill request for her rOPINIRole (REQUIP) 1 MG tablet sent in to the [facility] on [address]

## 2022-02-19 NOTE — Telephone Encounter (Signed)
Please call pt back.  She was last seen 12/10/20 and has next f/u 02/24/22. Per our records, [facility], 6 refills. There should be a remaining refill on file to get her through until she is seen.

## 2022-02-19 NOTE — Telephone Encounter (Signed)
Noted  

## 2022-02-19 NOTE — Telephone Encounter (Signed)
Called  pt back. She stated she will pick refill up today. Stated she is getting out hospital and will be at her appointment on 1/30.

## 2022-02-19 NOTE — Telephone Encounter (Signed)
Called pt to inform her of message and she stated that the pharmacy had called her to tell her that there were none left and even her bottle states 0 refills left. Please advise.

## 2022-02-24 ENCOUNTER — Ambulatory Visit: Payer: Commercial Managed Care - PPO | Admitting: Psychiatry

## 2022-02-26 NOTE — Progress Notes (Signed)
   CC:  RLS  Follow-up Visit  Last visit: 12/10/20  Brief HPI: 57 year old female with a history of DM, CKD on IHD, HTN who follows in clinic for RLS.  At her last visit she was continued on ropinirole 1 mg QHS, with optional extra 0.5-1 mg as needed  Interval History: Restless legs are currently well-controlled on ropinirole. Symptoms are generally worse on dialysis days, so she will take an extra dose on those days. She is taking 1 pill at bedtime on non-dialysis days and 2 pills on IHD days. Tolerates this well without side effects. Notes that her symptoms are worse with caffeine intake.   Physical Exam:   Vital Signs: LMP 07/17/2011  GENERAL:  well appearing, in no acute distress, alert  SKIN:  Color, texture, turgor normal. No rashes or lesions HEAD:  Normocephalic/atraumatic. RESP: normal respiratory effort MSK:  Left foot in splint (recent surgery)  NEUROLOGICAL: Mental Status: Alert, oriented to person, place and time, Follows commands, and Speech fluent and appropriate. Cranial Nerves: PERRL, face symmetric, no dysarthria, hearing grossly intact Motor: moves all extremities equally Gait: limping on left due to foot pain (recent surgery)  IMPRESSION: 57 year old female with a history of DM, CKD on IHD, HTN who follows in clinic for RLS. Her symptoms are currently stable with ropinirole 1 mg daily + an extra 1 mg on dialysis days. Will continue current regimen for now. Discussed exacerbating factors of RLS including caffeine intake.   PLAN: -Continue ropinirole 1 mg daily + extra 1 mg on dialysis days   Follow-up: 1 year  I spent a total of 15 minutes on the date of the service. Discussed medication side effects, adverse reactions and drug interactions. Written educational materials and patient instructions outlining all of the above were given.  Genia Harold, MD 03/02/22 9:10 AM

## 2022-03-02 ENCOUNTER — Encounter: Payer: Self-pay | Admitting: Psychiatry

## 2022-03-02 ENCOUNTER — Ambulatory Visit: Payer: Commercial Managed Care - PPO | Admitting: Psychiatry

## 2022-03-02 VITALS — BP 191/85 | HR 87 | Ht 66.5 in | Wt 146.0 lb

## 2022-03-02 DIAGNOSIS — G2581 Restless legs syndrome: Secondary | ICD-10-CM | POA: Diagnosis not present

## 2022-03-18 ENCOUNTER — Other Ambulatory Visit: Payer: Self-pay | Admitting: Psychiatry

## 2022-04-02 ENCOUNTER — Telehealth: Payer: Self-pay | Admitting: Psychiatry

## 2022-04-02 NOTE — Telephone Encounter (Signed)
Patient informed with below.  

## 2022-04-02 NOTE — Telephone Encounter (Signed)
Pt said my Neurologist prescribed rOPINIRole (REQUIP) 1 MG tablet 1 1/2 tablet daily. I'm in the hospital at [facility] for foot surgery; they're giving me 11/2 tablet at night. I Need 1 tablet during the day, Physician will not listen because the way the prescription is written. Brought medication from home with me and they took it. Want to be givben 1 tablet am and 1 tablet in afternoon because not moving and restless leg is getting really bad. Would like a call back to see if something can be done.

## 2022-04-02 NOTE — Telephone Encounter (Signed)
I'm fine with her taking 1 tablet twice a day instead of 1.5 mg at bedtime. Ultimately it's up to the inpatient physician to decide how they prescribe it in the hospital though. I can change the outpatient prescription if that would be helpful, but her hospital doctor will be the one to control her medications while she's admitted.

## 2022-05-14 ENCOUNTER — Telehealth: Payer: Self-pay | Admitting: Psychiatry

## 2022-05-14 NOTE — Telephone Encounter (Signed)
Pt called stating that the Ropinirole she is on is making her be unable to sleep. Pt states that she is having the restless leg feeling in her Chest and her Left Arm. Pt states that what she has been feeling has lasted for a couple of weeks and still going on. Pt is wanting to know what she can do. Told pt that Dr.Chima will be notified about this situation. Pt verbalized understanding.

## 2022-05-14 NOTE — Telephone Encounter (Signed)
Pt called stating that the  rOPINIRole (REQUIP) 1 MG tablet is not working as well anymore and is starting to feel the sensation up her chest and L arm.She would like to know is there something else that can be advised to alleviate her.

## 2022-05-14 NOTE — Telephone Encounter (Addendum)
Tried calling pt, mailbox full and unable to LVM  

## 2022-05-15 NOTE — Telephone Encounter (Signed)
Pt called back. Asking was Dr. Delena Bali going to call her in a prescription to the pharmacy. Pt said her restless legs has gotten worse.

## 2022-05-18 ENCOUNTER — Other Ambulatory Visit: Payer: Self-pay | Admitting: Neurology

## 2022-05-18 MED ORDER — GABAPENTIN 300 MG PO CAPS: 300.0000 mg | ORAL_CAPSULE | Freq: Every day | ORAL | 0 refills | Status: DC

## 2022-05-18 NOTE — Telephone Encounter (Signed)
Dr. Teresa Coombs, it does not look like Dr. Delena Bali got to this message last week. She is out today. Are you able to review? You are work in this am

## 2022-05-18 NOTE — Telephone Encounter (Signed)
Told pt that Dr. Teresa Coombs sent her in a low dose of Gabapentin at night, 300 mg with the Ropinirole. He sent her a 30 days supply to her pharmacy. Pt verbalized understanding.

## 2022-05-18 NOTE — Telephone Encounter (Signed)
She can try a low dose of Gabapentin at night, 300 mg with the Ropinirole. I will send her a 30 days Rx.

## 2022-05-18 NOTE — Telephone Encounter (Signed)
Tried to call pt was unable to lvm

## 2022-05-22 ENCOUNTER — Telehealth: Payer: Self-pay | Admitting: Neurology

## 2022-05-22 NOTE — Telephone Encounter (Signed)
Patient called after hours call service twice. The message indicated that the gabapentin made her dizzy and unable to walk straight.  The first time I called back it went to VM and mail box was full.  The second message had son's number. Son is not on DPR, I still called the # and it went to VM. I did not leave a message but sent 2 written messages back to the call service. The patient was advised to stop the gabapentin. If she had sudden onset of imbalance she should go to the ER. It may be unrelated to the gabapentin.

## 2022-05-25 NOTE — Telephone Encounter (Signed)
I agree with recommendations. Thank you.

## 2022-05-28 MED ORDER — GABAPENTIN 100 MG PO CAPS: 100.0000 mg | ORAL_CAPSULE | Freq: Every day | ORAL | 6 refills | Status: DC

## 2022-05-28 NOTE — Telephone Encounter (Signed)
Pt has called back stating how she loves how the gabapentin helps her to sleep and how it also helps her RLS.  Pt states when she is up she feels dopey, however she does not want to come off of the gabapentin until given something in its place.  Phone rep went over the response from Dr Frances Furbish, pt said she no longer has the imbalance as an issues, she believes she was dehydrated(she states that has been an issue previously). Pt is asking to be called about a medication in place of the gabapentin.  Message being routed to POD 1 as pt last saw Dr Delena Bali

## 2022-05-28 NOTE — Telephone Encounter (Signed)
I called patient. Gabapentin 300mg  at bedtime helps sxs, but makes her feel groggy the next day. Will lower gabapentin to 100-200mg  at bedtime (as tolerated by patient). Patient has stopped ropinirole.   Meds ordered this encounter  Medications   gabapentin (NEURONTIN) 100 MG capsule    Sig: Take 1-3 capsules (100-300 mg total) by mouth at bedtime.    Dispense:  90 capsule    Refill:  6    Suanne Marker, MD 05/28/2022, 1:56 PM Certified in Neurology, Neurophysiology and Neuroimaging  [facility] [address] [place], [place] [address] [phone]

## 2022-05-28 NOTE — Addendum Note (Signed)
Addended by: Joycelyn Schmid R on: 05/28/2022 01:56 PM   Modules accepted: Orders

## 2022-05-28 NOTE — Telephone Encounter (Signed)
Dr. Marjory Lies, you are work in this afternoon. Dr. Delena Bali out on leave. Do you have any recommendations?

## 2022-06-11 ENCOUNTER — Telehealth: Payer: Self-pay | Admitting: Psychiatry

## 2022-06-11 MED ORDER — GABAPENTIN 300 MG PO CAPS: 300.0000 mg | ORAL_CAPSULE | Freq: Every day | ORAL | 5 refills | Status: DC

## 2022-06-11 NOTE — Telephone Encounter (Signed)
Took call and spoke w/ pt. She was prescribed gabapentin 300mg  po qhs initially but felt she was having SE. She called 05/28/22 and spoke w/ Dr. Marjory Lies. He prescribed lower dose (100-200mg  po qhs). She never picked this up. She continued on 300mg  po qhs and has been tolerating well, no SE and states it is helping pain. She would like to continue on this dose. Aware we will cx lower dose sent in a resend 300mg  dose to pharmacy to update them.  She is also back to taking requip.

## 2022-06-11 NOTE — Telephone Encounter (Signed)
Pt is asking for a call from RN to discuss 30 mg of the gabapentin (NEURONTIN) capsule ,

## 2022-06-11 NOTE — Telephone Encounter (Signed)
Called pt back at [phone]. Mailbox full, unable to leave message.

## 2022-07-15 ENCOUNTER — Other Ambulatory Visit: Payer: Self-pay | Admitting: Cardiology

## 2022-07-15 DIAGNOSIS — Z7901 Long term (current) use of anticoagulants: Secondary | ICD-10-CM

## 2022-07-15 DIAGNOSIS — I480 Paroxysmal atrial fibrillation: Secondary | ICD-10-CM

## 2022-08-16 ENCOUNTER — Other Ambulatory Visit: Payer: Self-pay | Admitting: Psychiatry

## 2022-08-17 NOTE — Telephone Encounter (Signed)
Pt last seen on 12/10/20 Follow up scheduled on 03/08/23  Per telephone note on 05/22/22 "  I called patient. Gabapentin 300mg  at bedtime helps sxs, but makes her feel groggy the next day. Will lower gabapentin to 100-200mg  at bedtime (as tolerated by patient). Patient has stopped ropinirole.

## 2022-09-03 ENCOUNTER — Telehealth: Payer: Self-pay | Admitting: Psychiatry

## 2022-09-03 NOTE — Telephone Encounter (Signed)
  Looks like it was discontinued by other office for unknown reason. Per Epic last refill 07.07.2024, last OV was February 2024. Refill due if agreeable.

## 2022-09-03 NOTE — Telephone Encounter (Signed)
Pt called stating that she is needing a refill on her rOPINIRole (REQUIP) 1 MG tablet but states that she was informed that it no longer is on her medication list. Pt would like to know why it was discontinued. Please advise.

## 2022-09-05 DIAGNOSIS — E119 Type 2 diabetes mellitus without complications: Secondary | ICD-10-CM

## 2022-09-05 DIAGNOSIS — R197 Diarrhea, unspecified: Secondary | ICD-10-CM

## 2022-09-05 DIAGNOSIS — M86171 Other acute osteomyelitis, right ankle and foot: Secondary | ICD-10-CM

## 2022-09-05 DIAGNOSIS — N186 End stage renal disease: Secondary | ICD-10-CM

## 2022-09-06 ENCOUNTER — Other Ambulatory Visit: Payer: Self-pay | Admitting: Psychiatry

## 2022-09-06 DIAGNOSIS — M86171 Other acute osteomyelitis, right ankle and foot: Secondary | ICD-10-CM | POA: Diagnosis not present

## 2022-09-06 DIAGNOSIS — N186 End stage renal disease: Secondary | ICD-10-CM | POA: Diagnosis not present

## 2022-09-06 DIAGNOSIS — R197 Diarrhea, unspecified: Secondary | ICD-10-CM | POA: Diagnosis not present

## 2022-09-06 DIAGNOSIS — E119 Type 2 diabetes mellitus without complications: Secondary | ICD-10-CM | POA: Diagnosis not present

## 2022-09-07 ENCOUNTER — Encounter (HOSPITAL_COMMUNITY): Payer: Self-pay | Admitting: *Deleted

## 2022-09-07 MED ORDER — ROPINIROLE HCL 1 MG PO TABS: 1.0000 mg | ORAL_TABLET | Freq: Every day | ORAL | 0 refills | Status: DC | PRN

## 2022-09-07 NOTE — Telephone Encounter (Signed)
Pt called wanting to know when this medication will be called in for her. Pt is in hospital and states that the RLS is driving her crazy. Please advise.

## 2022-09-07 NOTE — Telephone Encounter (Signed)
Dr.Chima approved Rx today, however Rx printed. Rx re-sent to pharmacy.

## 2022-09-07 NOTE — Addendum Note (Signed)
Addended by: Aura Camps on: 09/07/2022 03:45 PM   Modules accepted: Orders

## 2022-09-07 NOTE — Telephone Encounter (Signed)
Previous Rx was sent to Dr.Chima for approval. Rx was d/c'd in chart see 09/03/22 refill request.

## 2022-09-19 ENCOUNTER — Other Ambulatory Visit: Payer: Self-pay | Admitting: Psychiatry

## 2022-09-21 NOTE — Telephone Encounter (Signed)
Rx was sent on 09/07/22

## 2022-10-03 ENCOUNTER — Other Ambulatory Visit: Payer: Self-pay | Admitting: Psychiatry

## 2022-10-06 NOTE — Telephone Encounter (Signed)
Last see on 03/02/22 No follow up scheduled

## 2022-10-07 ENCOUNTER — Encounter: Payer: Commercial Managed Care - PPO | Attending: Internal Medicine | Admitting: Internal Medicine

## 2022-10-07 DIAGNOSIS — Z89421 Acquired absence of other right toe(s): Secondary | ICD-10-CM | POA: Insufficient documentation

## 2022-10-07 DIAGNOSIS — L97518 Non-pressure chronic ulcer of other part of right foot with other specified severity: Secondary | ICD-10-CM | POA: Diagnosis not present

## 2022-10-07 DIAGNOSIS — Z794 Long term (current) use of insulin: Secondary | ICD-10-CM | POA: Diagnosis not present

## 2022-10-07 DIAGNOSIS — E1151 Type 2 diabetes mellitus with diabetic peripheral angiopathy without gangrene: Secondary | ICD-10-CM | POA: Insufficient documentation

## 2022-10-07 DIAGNOSIS — Z992 Dependence on renal dialysis: Secondary | ICD-10-CM | POA: Diagnosis not present

## 2022-10-07 DIAGNOSIS — N186 End stage renal disease: Secondary | ICD-10-CM | POA: Insufficient documentation

## 2022-10-07 DIAGNOSIS — I509 Heart failure, unspecified: Secondary | ICD-10-CM | POA: Diagnosis not present

## 2022-10-07 DIAGNOSIS — E1122 Type 2 diabetes mellitus with diabetic chronic kidney disease: Secondary | ICD-10-CM | POA: Insufficient documentation

## 2022-10-07 DIAGNOSIS — E11621 Type 2 diabetes mellitus with foot ulcer: Secondary | ICD-10-CM | POA: Diagnosis present

## 2022-10-07 DIAGNOSIS — I739 Peripheral vascular disease, unspecified: Secondary | ICD-10-CM

## 2022-10-07 DIAGNOSIS — I132 Hypertensive heart and chronic kidney disease with heart failure and with stage 5 chronic kidney disease, or end stage renal disease: Secondary | ICD-10-CM | POA: Diagnosis not present

## 2022-10-09 NOTE — Progress Notes (Signed)
No None N/A N/A Epithelialization: Debridement - Excisional N/A N/A Debridement: Pre-procedure Verification/Time Out 09:45 N/A N/A Taken: Subcutaneous, Slough N/A N/A Tissue Debrided: Skin/Subcutaneous Tissue N/A N/A Level: 16.48 N/A N/A Debridement A (sq cm): rea Curette N/A N/A Instrument: Moderate N/A N/A Bleeding: Silver Nitrate N/A N/A Hemostasis A chieved: 0 N/A N/A Procedural Pain: 0 N/A N/A Post Procedural Pain: Procedure was tolerated well N/A N/A Debridement Treatment Response: 7x3x1.4 N/A N/A Post Debridement Measurements L x W x D (cm) 23.091 N/A N/A Post Debridement Volume: (cm) Debridement N/A N/A Procedures Performed: Treatment Notes Wound #1 (Foot) Wound Laterality: Left Cleanser Byram Ancillary Kit - 15 Day Supply Discharge Instruction: Use supplies as instructed; Kit contains: (15) Saline Bullets; (15) 3x3 Gauze; 15 pr Gloves Dakin 16 (oz) 0.25 Discharge Instruction: Use as directed. Peri-Wound Care Topical Primary Dressing Gauze Discharge Instruction: moistened with vashe Secondary Dressing ABD Pad 5x9 (in/in) Discharge Instruction: Cover with  ABD pad Secured With Medipore T - 62M Medipore H Soft Cloth Surgical T ape ape, 2x2 (in/yd) Kerlix Roll Sterile or Non-Sterile 6-ply 4.5x4 (yd/yd) Jerline Pain ([id number]) [id number] Page 6 of 10 Discharge Instruction: Apply Kerlix as directed Compression Wrap Compression Stockings Add-Ons Electronic Signature(s) Signed: 10/07/2022 1:14:22 PM By: Geralyn Corwin DO Entered By: Geralyn Corwin on 10/07/2022 10:23:56 -------------------------------------------------------------------------------- Multi-Disciplinary Care Plan Details Patient Name: Date of Service: Holly [name]. 10/07/2022 8:45 A M Medical Record Number: [id number] Patient Account Number: [id number] Date of Birth/Sex: Treating RN: [birth year] (57 y.o. Holly [name] Primary Care Aeron Lheureux: PA Zenovia [place], [place] Other Clinician: Referring Sartaj Hoskin: Treating Mithran Strike/Extender: Geralyn Corwin FA RRA R, [place] NDA CE Weeks in Treatment: 0 Active Inactive Necrotic Tissue Nursing Diagnoses: Knowledge deficit related to management of necrotic/devitalized tissue Goals: Necrotic/devitalized tissue will be minimized in the wound bed Date Initiated: 10/07/2022 Target Resolution Date: 11/06/2022 Goal Status: Active Interventions: Assess patient pain level pre-, during and post procedure and prior to discharge Notes: Nutrition Nursing Diagnoses: Potential for alteratiion in Nutrition/Potential for imbalanced nutrition Goals: Patient/caregiver will maintain therapeutic glucose control Date Initiated: 10/07/2022 Target Resolution Date: 11/06/2022 Goal Status: Active Interventions: Assess HgA1c results as ordered upon admission and as needed Assess patient nutrition upon admission and as needed per policy Notes: Wound/Skin Impairment Nursing Diagnoses: Knowledge deficit related to ulceration/compromised skin integrity Goals: Jerline Pain ([id number]) [id number] Page 7  of 10 Patient/caregiver will verbalize understanding of skin care regimen Date Initiated: 10/07/2022 Target Resolution Date: 11/06/2022 Goal Status: Active Ulcer/skin breakdown will have a volume reduction of 30% by week 4 Date Initiated: 10/07/2022 Target Resolution Date: 11/06/2022 Goal Status: Active Ulcer/skin breakdown will have a volume reduction of 50% by week 8 Date Initiated: 10/07/2022 Target Resolution Date: 12/07/2022 Goal Status: Active Ulcer/skin breakdown will have a volume reduction of 80% by week 12 Date Initiated: 10/07/2022 Target Resolution Date: 01/06/2023 Goal Status: Active Ulcer/skin breakdown will heal within 14 weeks Date Initiated: 10/07/2022 Target Resolution Date: 02/06/2023 Goal Status: Active Interventions: Assess patient/caregiver ability to obtain necessary supplies Assess patient/caregiver ability to perform ulcer/skin care regimen upon admission and as needed Assess ulceration(s) every visit Notes: Electronic Signature(s) Signed: 10/09/2022 1:15:19 PM By: Yevonne Pax RN Entered By: Yevonne Pax on 10/07/2022 09:30:47 -------------------------------------------------------------------------------- Pain Assessment Details Patient Name: Date of Service: Holly [name]. 10/07/2022 8:45 A M Medical Record Number: [id number] Patient Account Number: [id number] Date of Birth/Sex: Treating RN: 1965-03-08 (57 y.o. Holly [name] Primary Care Juanluis Guastella: PA Zenovia Jordan, [place] Other Clinician: Referring Samera Macy: Treating Ranbir Chew/Extender: Geralyn Corwin FA  No None N/A N/A Epithelialization: Debridement - Excisional N/A N/A Debridement: Pre-procedure Verification/Time Out 09:45 N/A N/A Taken: Subcutaneous, Slough N/A N/A Tissue Debrided: Skin/Subcutaneous Tissue N/A N/A Level: 16.48 N/A N/A Debridement A (sq cm): rea Curette N/A N/A Instrument: Moderate N/A N/A Bleeding: Silver Nitrate N/A N/A Hemostasis A chieved: 0 N/A N/A Procedural Pain: 0 N/A N/A Post Procedural Pain: Procedure was tolerated well N/A N/A Debridement Treatment Response: 7x3x1.4 N/A N/A Post Debridement Measurements L x W x D (cm) 23.091 N/A N/A Post Debridement Volume: (cm) Debridement N/A N/A Procedures Performed: Treatment Notes Wound #1 (Foot) Wound Laterality: Left Cleanser Byram Ancillary Kit - 15 Day Supply Discharge Instruction: Use supplies as instructed; Kit contains: (15) Saline Bullets; (15) 3x3 Gauze; 15 pr Gloves Dakin 16 (oz) 0.25 Discharge Instruction: Use as directed. Peri-Wound Care Topical Primary Dressing Gauze Discharge Instruction: moistened with vashe Secondary Dressing ABD Pad 5x9 (in/in) Discharge Instruction: Cover with  ABD pad Secured With Medipore T - 62M Medipore H Soft Cloth Surgical T ape ape, 2x2 (in/yd) Kerlix Roll Sterile or Non-Sterile 6-ply 4.5x4 (yd/yd) Jerline Pain ([id number]) [id number].pdf Page 6 of 10 Discharge Instruction: Apply Kerlix as directed Compression Wrap Compression Stockings Add-Ons Electronic Signature(s) Signed: 10/07/2022 1:14:22 PM By: Geralyn Corwin DO Entered By: Geralyn Corwin on 10/07/2022 10:23:56 -------------------------------------------------------------------------------- Multi-Disciplinary Care Plan Details Patient Name: Date of Service: Holly [name]. 10/07/2022 8:45 A M Medical Record Number: [id number] Patient Account Number: [id number] Date of Birth/Sex: Treating RN: [birth year] (57 y.o. Holly [name] Primary Care Aeron Lheureux: PA Zenovia [place], [place] Other Clinician: Referring Sartaj Hoskin: Treating Mithran Strike/Extender: Geralyn Corwin FA RRA R, [place] NDA CE Weeks in Treatment: 0 Active Inactive Necrotic Tissue Nursing Diagnoses: Knowledge deficit related to management of necrotic/devitalized tissue Goals: Necrotic/devitalized tissue will be minimized in the wound bed Date Initiated: 10/07/2022 Target Resolution Date: 11/06/2022 Goal Status: Active Interventions: Assess patient pain level pre-, during and post procedure and prior to discharge Notes: Nutrition Nursing Diagnoses: Potential for alteratiion in Nutrition/Potential for imbalanced nutrition Goals: Patient/caregiver will maintain therapeutic glucose control Date Initiated: 10/07/2022 Target Resolution Date: 11/06/2022 Goal Status: Active Interventions: Assess HgA1c results as ordered upon admission and as needed Assess patient nutrition upon admission and as needed per policy Notes: Wound/Skin Impairment Nursing Diagnoses: Knowledge deficit related to ulceration/compromised skin integrity Goals: Jerline Pain ([id number]) [id number].pdf Page 7  of 10 Patient/caregiver will verbalize understanding of skin care regimen Date Initiated: 10/07/2022 Target Resolution Date: 11/06/2022 Goal Status: Active Ulcer/skin breakdown will have a volume reduction of 30% by week 4 Date Initiated: 10/07/2022 Target Resolution Date: 11/06/2022 Goal Status: Active Ulcer/skin breakdown will have a volume reduction of 50% by week 8 Date Initiated: 10/07/2022 Target Resolution Date: 12/07/2022 Goal Status: Active Ulcer/skin breakdown will have a volume reduction of 80% by week 12 Date Initiated: 10/07/2022 Target Resolution Date: 01/06/2023 Goal Status: Active Ulcer/skin breakdown will heal within 14 weeks Date Initiated: 10/07/2022 Target Resolution Date: 02/06/2023 Goal Status: Active Interventions: Assess patient/caregiver ability to obtain necessary supplies Assess patient/caregiver ability to perform ulcer/skin care regimen upon admission and as needed Assess ulceration(s) every visit Notes: Electronic Signature(s) Signed: 10/09/2022 1:15:19 PM By: Yevonne Pax RN Entered By: Yevonne Pax on 10/07/2022 09:30:47 -------------------------------------------------------------------------------- Pain Assessment Details Patient Name: Date of Service: Holly [name]. 10/07/2022 8:45 A M Medical Record Number: [id number] Patient Account Number: [id number] Date of Birth/Sex: Treating RN: 1965-03-08 (57 y.o. Holly [name] Primary Care Juanluis Guastella: PA Zenovia Jordan, [place] Other Clinician: Referring Samera Macy: Treating Ranbir Chew/Extender: Geralyn Corwin FA  Hypo/Hyperglycemic Management (do not check if billed separately) []  - 0 Ankle / Brachial Index (ABI) - do not check if billed separately Has the patient been seen at the hospital within the last three years: Yes Total Score: 75 Level Of Care: New/Established - Level 2 Electronic Signature(s) Signed: 10/09/2022 1:15:19 PM By: Yevonne Pax RN Entered By: Yevonne Pax on 10/07/2022 09:55:37 -------------------------------------------------------------------------------- Encounter Discharge Information Details Patient Name: Date of Service: Artemio Aly M. 10/07/2022 8:45 A M Medical Record Number: [id number] Patient Account Number: [id number] Date of Birth/Sex: Treating RN: 06-Aug-1965 (57 y.o. Holly [name] Primary Care Dariela Stoker: [place] Zenovia [place], [place] Other Clinician: Referring Samier Jaco: Treating Mikaylee Arseneau/Extender: Geralyn Corwin FA RRA R, [place] NDA CE Weeks in Treatment: 0 Encounter Discharge Information Items Post Procedure Vitals Discharge Condition: Stable Temperature (F): 97.9 Ambulatory Status: Ambulatory Pulse (bpm): 89 Discharge Destination: Home Respiratory Rate (breaths/min): 18 Transportation: Private Auto Blood Pressure (mmHg): 123/79 Accompanied By: self Schedule Follow-up Appointment: Yes Clinical Summary of Care: Electronic Signature(s) Signed: 10/09/2022 1:15:19 PM By: Yevonne Pax RN Entered By: Yevonne Pax on 10/07/2022 09:58:05 -------------------------------------------------------------------------------- Lower Extremity Assessment Details Patient Name: Date of Service: Holly [name]. 10/07/2022 8:45 A M Medical Record Number: [id number] Patient Account  Number: [id number] Date of Birth/Sex: Treating RN: 09-06-1965 (57 y.o. Holly [name] Primary Care Brandee Markin: [address] [place] Other Clinician: ROLLA, HARDIMON ([id number]) [id number].pdf Page 4 of 10 Referring Jameisha Stofko: Treating Reygan Heagle/Extender: Geralyn Corwin FA RRA R, CA NDA CE Weeks in Treatment: 0 Edema Assessment Assessed: [Left: No] [Right: No] Edema: [Left: Ye] [Right: s] Calf Left: Right: Point of Measurement: 36 cm From Medial Instep 33 cm Ankle Left: Right: Point of Measurement: 10 cm From Medial Instep 21 cm Knee To Floor Left: Right: From Medial Instep 46 cm Vascular Assessment Pulses: Dorsalis Pedis Palpable: [Right:Yes] Extremity colors, hair growth, and conditions: Extremity Color: [Right:Hyperpigmented] Hair Growth on Extremity: [Right:No] Temperature of Extremity: [Right:Warm < 3 seconds] Notes transmet amputation Electronic Signature(s) Signed: 10/09/2022 1:15:19 PM By: Yevonne Pax RN Entered By: Yevonne Pax on 10/07/2022 09:29:24 -------------------------------------------------------------------------------- Multi Wound Chart Details Patient Name: Date of Service: [name], Misty Stanley M. 10/07/2022 8:45 A M Medical Record Number: [id number] Patient Account Number: [id number] Date of Birth/Sex: Treating RN: 1965/05/03 (58 y.o. Holly [name] Primary Care Griffon Herberg: [place] Zenovia [place], [place] Other Clinician: Referring Mclain Freer: Treating Maahi Lannan/Extender: Geralyn Corwin FA RRA R, [place] NDA CE Weeks in Treatment: 0 Vital Signs Height(in): 66 Pulse(bpm): 89 Weight(lbs): 155 Blood Pressure(mmHg): 173/79 Body Mass Index(BMI): 25 Temperature(F): 97.9 Respiratory Rate(breaths/min): 18 [1:Photos:] [N/A:N/A] Left Foot N/A N/A Wound Location: Gradually Appeared N/A N/A Wounding Event: Dehisced Wound N/A N/A Primary Etiology: Congestive Heart Failure, N/A N/A Comorbid History: Hypertension, Peripheral Venous Disease, Type II Diabetes, End  Stage Renal Disease 08/19/2022 N/A N/A Date Acquired: 0 N/A N/A Weeks of Treatment: Open N/A N/A Wound Status: No N/A N/A Wound Recurrence: 7x3x1.4 N/A N/A Measurements L x W x D (cm) 16.493 N/A N/A A (cm) : rea 23.091 N/A N/A Volume (cm) : 12 Starting Position 1 (o'clock): 12 Ending Position 1 (o'clock): 0.7 Maximum Distance 1 (cm): Yes N/A N/A Undermining: Full Thickness With Exposed Support N/A N/A Classification: Structures Large N/A N/A Exudate A mount: Serosanguineous N/A N/A Exudate Type: red, brown N/A N/A Exudate Color: Medium (34-66%) N/A N/A Granulation A mount: Pink, Pale N/A N/A Granulation Quality: Medium (34-66%) N/A N/A Necrotic A mount: Fat Layer (Subcutaneous Tissue): Yes N/A N/A Exposed Structures: Muscle: Yes Fascia: No Tendon: No Joint: No Bone:  No None N/A N/A Epithelialization: Debridement - Excisional N/A N/A Debridement: Pre-procedure Verification/Time Out 09:45 N/A N/A Taken: Subcutaneous, Slough N/A N/A Tissue Debrided: Skin/Subcutaneous Tissue N/A N/A Level: 16.48 N/A N/A Debridement A (sq cm): rea Curette N/A N/A Instrument: Moderate N/A N/A Bleeding: Silver Nitrate N/A N/A Hemostasis A chieved: 0 N/A N/A Procedural Pain: 0 N/A N/A Post Procedural Pain: Procedure was tolerated well N/A N/A Debridement Treatment Response: 7x3x1.4 N/A N/A Post Debridement Measurements L x W x D (cm) 23.091 N/A N/A Post Debridement Volume: (cm) Debridement N/A N/A Procedures Performed: Treatment Notes Wound #1 (Foot) Wound Laterality: Left Cleanser Byram Ancillary Kit - 15 Day Supply Discharge Instruction: Use supplies as instructed; Kit contains: (15) Saline Bullets; (15) 3x3 Gauze; 15 pr Gloves Dakin 16 (oz) 0.25 Discharge Instruction: Use as directed. Peri-Wound Care Topical Primary Dressing Gauze Discharge Instruction: moistened with vashe Secondary Dressing ABD Pad 5x9 (in/in) Discharge Instruction: Cover with  ABD pad Secured With Medipore T - 62M Medipore H Soft Cloth Surgical T ape ape, 2x2 (in/yd) Kerlix Roll Sterile or Non-Sterile 6-ply 4.5x4 (yd/yd) Jerline Pain ([id number]) [id number].pdf Page 6 of 10 Discharge Instruction: Apply Kerlix as directed Compression Wrap Compression Stockings Add-Ons Electronic Signature(s) Signed: 10/07/2022 1:14:22 PM By: Geralyn Corwin DO Entered By: Geralyn Corwin on 10/07/2022 10:23:56 -------------------------------------------------------------------------------- Multi-Disciplinary Care Plan Details Patient Name: Date of Service: Holly [name]. 10/07/2022 8:45 A M Medical Record Number: [id number] Patient Account Number: [id number] Date of Birth/Sex: Treating RN: [birth year] (57 y.o. Holly [name] Primary Care Aeron Lheureux: [place] Zenovia [place], [place] Other Clinician: Referring Sartaj Hoskin: Treating Mithran Strike/Extender: Geralyn Corwin FA RRA R, [place] NDA CE Weeks in Treatment: 0 Active Inactive Necrotic Tissue Nursing Diagnoses: Knowledge deficit related to management of necrotic/devitalized tissue Goals: Necrotic/devitalized tissue will be minimized in the wound bed Date Initiated: 10/07/2022 Target Resolution Date: 11/06/2022 Goal Status: Active Interventions: Assess patient pain level pre-, during and post procedure and prior to discharge Notes: Nutrition Nursing Diagnoses: Potential for alteratiion in Nutrition/Potential for imbalanced nutrition Goals: Patient/caregiver will maintain therapeutic glucose control Date Initiated: 10/07/2022 Target Resolution Date: 11/06/2022 Goal Status: Active Interventions: Assess HgA1c results as ordered upon admission and as needed Assess patient nutrition upon admission and as needed per policy Notes: Wound/Skin Impairment Nursing Diagnoses: Knowledge deficit related to ulceration/compromised skin integrity Goals: Jerline Pain ([id number]) [id number].pdf Page 7  of 10 Patient/caregiver will verbalize understanding of skin care regimen Date Initiated: 10/07/2022 Target Resolution Date: 11/06/2022 Goal Status: Active Ulcer/skin breakdown will have a volume reduction of 30% by week 4 Date Initiated: 10/07/2022 Target Resolution Date: 11/06/2022 Goal Status: Active Ulcer/skin breakdown will have a volume reduction of 50% by week 8 Date Initiated: 10/07/2022 Target Resolution Date: 12/07/2022 Goal Status: Active Ulcer/skin breakdown will have a volume reduction of 80% by week 12 Date Initiated: 10/07/2022 Target Resolution Date: 01/06/2023 Goal Status: Active Ulcer/skin breakdown will heal within 14 weeks Date Initiated: 10/07/2022 Target Resolution Date: 02/06/2023 Goal Status: Active Interventions: Assess patient/caregiver ability to obtain necessary supplies Assess patient/caregiver ability to perform ulcer/skin care regimen upon admission and as needed Assess ulceration(s) every visit Notes: Electronic Signature(s) Signed: 10/09/2022 1:15:19 PM By: Yevonne Pax RN Entered By: Yevonne Pax on 10/07/2022 09:30:47 -------------------------------------------------------------------------------- Pain Assessment Details Patient Name: Date of Service: Holly [name]. 10/07/2022 8:45 A M Medical Record Number: [id number] Patient Account Number: [id number] Date of Birth/Sex: Treating RN: 1965-03-08 (57 y.o. Holly [name] Primary Care Juanluis Guastella: PA Zenovia Jordan, [place] Other Clinician: Referring Samera Macy: Treating Ranbir Chew/Extender: Geralyn Corwin FA  YASAMINE, HACKETT [place] ([id number]) [id number].pdf Page 1 of 10 Visit Report for 10/07/2022 Allergy List Details Patient Name: Date of Service: COROLYN, HASHBARGER. 10/07/2022 8:45 A M Medical Record Number: [id number] Patient Account Number: [id number] Date of Birth/Sex: Treating RN: 10-25-1965 (57 y.o. Holly [name] Primary Care Zyaira Vejar: PA Zenovia Jordan, [place] Other Clinician: Referring Beau Vanduzer: Treating Marguriete Wootan/Extender: Geralyn Corwin FA RRA R, [place] NDA CE Weeks in Treatment: 0 Allergies Active Allergies [place]etta Bactrim Sulfa (Sulfonamide Antibiotics) hydrochlorothiazide spironolactone Allergy Notes Electronic Signature(s) Signed: 10/09/2022 1:15:19 PM By: Yevonne Pax RN Entered By: Yevonne Pax on 10/07/2022 09:10:47 -------------------------------------------------------------------------------- Arrival Information Details Patient Name: Date of Service: Holly [name]. 10/07/2022 8:45 A M Medical Record Number: [id number] Patient Account Number: [id number] Date of Birth/Sex: Treating RN: 03-Jun-1965 (57 y.o. Holly [name] Primary Care Camia Dipinto: [place] Zenovia [place], [place] Other Clinician: Referring Charna Neeb: Treating Tarrell Debes/Extender: Geralyn Corwin FA RRA R, [place] NDA CE Weeks in Treatment: 0 Visit Information Patient Arrived: Ambulatory Arrival Time: 09:00 Accompanied By: self Transfer Assistance: None Patient Identification Verified: Yes Secondary Verification Process CompletedZAHRA, BISSELL ([id number]) [id number].pdf Page 2 of 10 Patient Requires Transmission-Based Precautions: No Patient Has Alerts: Yes Patient Alerts: Diabetic Electronic Signature(s) Signed: 10/09/2022 1:15:19 PM By: Yevonne Pax RN Entered By: Yevonne Pax on 10/07/2022 09:08:27 -------------------------------------------------------------------------------- Clinic Level of Care Assessment Details Patient Name: Date of Service: THEARSA, GRANGER 10/07/2022 8:45 A  M Medical Record Number: [id number] Patient Account Number: [id number] Date of Birth/Sex: Treating RN: [birth year] (57 y.o. Holly [name] Primary Care Terianna Peggs: [place] Zenovia [place], [place] Other Clinician: Referring Frantz Quattrone: Treating Tyauna Lacaze/Extender: Geralyn Corwin FA RRA R, [place] NDA CE Weeks in Treatment: 0 Clinic Level of Care Assessment Items TOOL 1 Quantity Score X- 1 0 Use when EandM and Procedure is performed on INITIAL visit ASSESSMENTS - Nursing Assessment / Reassessment X- 1 20 General Physical Exam (combine w/ comprehensive assessment (listed just below) when performed on new pt. evals) X- 1 25 Comprehensive Assessment (HX, ROS, Risk Assessments, Wounds Hx, etc.) ASSESSMENTS - Wound and Skin Assessment / Reassessment []  - 0 Dermatologic / Skin Assessment (not related to wound area) ASSESSMENTS - Ostomy and/or Continence Assessment and Care []  - 0 Incontinence Assessment and Management []  - 0 Ostomy Care Assessment and Management (repouching, etc.) PROCESS - Coordination of Care X - Simple Patient / Family Education for ongoing care 1 15 []  - 0 Complex (extensive) Patient / Family Education for ongoing care []  - 0 Staff obtains Chiropractor, Records, T Results / Process Orders est []  - 0 Staff telephones HHA, Nursing Homes / Clarify orders / etc []  - 0 Routine Transfer to another Facility (non-emergent condition) []  - 0 Routine Hospital Admission (non-emergent condition) X- 1 15 New Admissions / Manufacturing engineer / Ordering NPWT Apligraf, etc. , []  - 0 Emergency Hospital Admission (emergent condition) PROCESS - Special Needs []  - 0 Pediatric / Minor Patient Management []  - 0 Isolation Patient Management []  - 0 Hearing / Language / Visual special needs []  - 0 Assessment of Community assistance (transportation, D/C planning, etc.) []  - 0 Additional assistance / Altered mentation []  - 0 Support Surface(s) Assessment (bed, cushion, seat, etc.) MIKIRA, MCELMURRAY  ([id number]) [id number] Page 3 of 10 INTERVENTIONS - Miscellaneous []  - 0 External ear exam []  - 0 Patient Transfer (multiple staff / Nurse, adult / Similar devices) []  - 0 Simple Staple / Suture removal (25 or less) []  - 0 Complex Staple / Suture removal (26 or more) []  - 0

## 2022-10-09 NOTE — Progress Notes (Signed)
Holly [name] [place] ([id number]) [phone] [email or web address] Page 1 of 5 Visit Report for 10/07/2022 Abuse Risk Screen Details Patient Name: Date of Service: Holly [name], Holly [name]. 10/07/2022 8:45 A M Medical Record Number: [id number] Patient Account Number: [id number] Date of Birth/Sex: Treating RN: [birth year] (57 y.o. Holly [name] Primary Care Holly [name]: [place] Holly [name], NO Other Clinician: Referring Holly [name]: Treating Holly [name]/Extender: Holly [name] FA RRA R, [place] NDA CE Weeks in Treatment: 0 Abuse Risk Screen Items Answer ABUSE RISK SCREEN: Has anyone close to you tried to hurt or harm you recentlyo No Do you feel uncomfortable with anyone in your familyo No Has anyone forced you do things that you didnt want to doo No Electronic Signature(s) Signed: 10/09/2022 1:15:19 PM By: Yevonne Pax RN Entered By: Yevonne Pax on 10/07/2022 09:14:35 -------------------------------------------------------------------------------- Activities of Daily Living Details Patient Name: Date of Service: Holly [name], Holly [name] 10/07/2022 8:45 A M Medical Record Number: [id number] Patient Account Number: [id number] Date of Birth/Sex: Treating RN: 1965/12/05 (57 y.o. Holly [name] Primary Care Holly [name]: PA Holly [name], NO Other Clinician: Referring Holly [name]: Treating Holly [name]/Extender: Holly [name] FA RRA R, [place] NDA CE Weeks in Treatment: 0 Activities of Daily Living Items Answer Activities of Daily Living (Please select one for each item) Drive Automobile Completely Able T Medications ake Completely Able Use T elephone Completely Able Care for Appearance Completely Able Use T oilet Completely Able Bath / Shower Completely Able Dress Self Completely Able Feed Self Completely Able Walk Completely Able Get In / Out Bed Completely Able Housework Completely Holly [name], Holly [name] ([id number]) [phone] [email or web address] Page 2 of 5 Prepare Meals Completely Able Handle Money  Completely Able Shop for Self Completely Able Electronic Signature(s) Signed: 10/09/2022 1:15:19 PM By: Yevonne Pax RN Entered By: Yevonne Pax on 10/07/2022 09:14:57 -------------------------------------------------------------------------------- Education Screening Details Patient Name: Date of Service: Holly [name], [place]. 10/07/2022 8:45 A M Medical Record Number: [id number] Patient Account Number: [id number] Date of Birth/Sex: Treating RN: 04/19/1965 (57 y.o. Holly [name] Primary Care Holly [name]: PA Holly [name], NO Other Clinician: Referring Holly [name]: Treating Holly [name]/Extender: Holly [name] FA RRA R, [place] NDA CE Weeks in Treatment: 0 Primary Learner Assessed: Patient Learning Preferences/Education Level/Primary Language Learning Preference: Explanation Highest Education Level: High School Preferred Language: English Cognitive Barrier Language Barrier: No Translator Needed: No Memory Deficit: No Emotional Barrier: No Cultural/Religious Beliefs Affecting Medical Care: No Physical Barrier Impaired Vision: No Impaired Hearing: No Decreased Hand dexterity: No Knowledge/Comprehension Knowledge Level: Medium Comprehension Level: High Ability to understand written instructions: High Ability to understand verbal instructions: High Motivation Anxiety Level: Anxious Cooperation: Cooperative Education Importance: Acknowledges Need Interest in Health Problems: Asks Questions Perception: Coherent Willingness to Engage in Self-Management High Activities: Readiness to Engage in Self-Management High Activities: Electronic Signature(s) Signed: 10/09/2022 1:15:19 PM By: Yevonne Pax RN Entered By: Yevonne Pax on 10/07/2022 09:15:25 Holly [name] ([id number]) [id number] Page 3 of 5 -------------------------------------------------------------------------------- Fall Risk Assessment Details Patient Name: Date of Service: Holly [name], [name] 10/07/2022  8:45 A M Medical Record Number: [id number] Patient Account Number: [id number] Date of Birth/Sex: Treating RN: [birth year] (58 y.o. Holly [name] Primary Care Holly [name]: PA Holly [name], NO Other Clinician: Referring Holly [name]: Treating Holly [name]/Extender: Holly [name] FA RRA R, [place] NDA CE Weeks in Treatment: 0 Fall Risk Assessment Items Have you had 2 or more falls in the last 12 monthso 0 No Have you had any fall that resulted in injury in the last 12 monthso 0 No FALLS RISK SCREEN History of falling - immediate or within 3  months 0 No Secondary diagnosis (Do you have 2 or more medical diagnoseso) 0 No Ambulatory aid None/bed rest/wheelchair/nurse 0 Yes Crutches/cane/walker 0 No Furniture 0 No Intravenous therapy Access/Saline/Heparin Lock 0 No Gait/Transferring Normal/ bed rest/ wheelchair 0 Yes Weak (short steps with or without shuffle, stooped but able to lift head while walking, may seek 0 No support from furniture) Impaired (short steps with shuffle, may have difficulty arising from chair, head down, impaired 0 No balance) Mental Status Oriented to own ability 0 Yes Electronic Signature(s) Signed: 10/09/2022 1:15:19 PM By: Yevonne Pax RN Entered By: Yevonne Pax on 10/07/2022 09:15:36 -------------------------------------------------------------------------------- Foot Assessment Details Patient Name: Date of Service: Holly July. 10/07/2022 8:45 A M Medical Record Number: [id number] Patient Account Number: [id number] Date of Birth/Sex: Treating RN: 02/23/1965 (57 y.o. 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 Site Locations [place], [place] Holly [name] ([id number]) [phone] [email or web address] Page 4 of 5 + = Sensation present, - = Sensation absent, C  = Callus, U = Ulcer R = Redness, W = Warmth, M = Maceration, PU = Pre-ulcerative lesion F = Fissure, S = Swelling, D = Dryness Assessment Right: Left: Other Deformity: No Prior Foot Ulcer: No Prior Amputation: No Charcot Joint: No Ambulatory Status: Ambulatory Without Help Gait: Steady Electronic Signature(s) Signed: 10/09/2022 1:15:19 PM By: Yevonne Pax RN Entered By: Yevonne Pax on 10/07/2022 09:22:24 -------------------------------------------------------------------------------- Nutrition Risk Screening Details Patient Name: Date of Service: Holly July. 10/07/2022 8:45 A M Medical Record Number: [id number] Patient Account Number: [id number] Date of Birth/Sex: Treating RN: May 11, 1965 (57 y.o. Holly [name] Primary Care Wyatt Thorstenson: PA Holly [name], NO Other Clinician: Referring Burl Tauzin: Treating Anjelica Gorniak/Extender: Holly [name] FA RRA R, [place] NDA CE Weeks in Treatment: 0 Height (in): 66 Weight (lbs): 155 Body Mass Index (BMI): 25 Nutrition Risk Screening Items Score Screening NUTRITION RISK SCREEN: I have an illness or condition that made me change the kind and/or amount of food I eat 2 Yes I eat fewer than two meals per day 0 No I eat few fruits and vegetables, or milk products 0 No I have three or more drinks of beer, liquor or wine almost every day 0 No I have tooth or mouth problems that make it hard for me to eat 0 No I don't always have enough money to buy the food I need 0 No Holly [name], Holly [name] ([id number]) [phone] [email or web address] Page 5 of 5 I eat alone most of the time 1 Yes I take three or more different prescribed or over-the-counter drugs a day 0 No Without wanting to, I have lost or gained 10 pounds in the last six months 0 No I am not always physically able to shop, cook and/or feed myself 0 No Nutrition Protocols Good Risk Protocol Moderate Risk Protocol 0 Provide education on nutrition High Risk Proctocol Risk Level: Moderate  Risk Score: 3 Electronic Signature(s) Signed: 10/09/2022 1:15:19 PM By: Yevonne Pax RN Entered By: Yevonne Pax on 10/07/2022 09:15:47

## 2022-10-09 NOTE — Progress Notes (Signed)
Discharge Instructions: moistened with vashe Secondary Dressing: ABD Pad 5x9 (in/in) (DME) (Generic) 1 x Per Day/15 Days Discharge Instructions: Cover with ABD pad Secured With: Medipore T - 72M Medipore H Soft Cloth Surgical T ape ape, 2x2 (in/yd) (DME) (Generic) 1 x Per Day/15 Days Secured With: Kerlix Roll Sterile or Non-Sterile 6-ply 4.5x4 (yd/yd) (DME) (Generic) 1 x Per Day/15 Days Discharge Instructions: Apply Kerlix as directed 1. In office sharp debridement 2. Vashe wet-to-dry dressings 3. Aggressive offloadingoffloading boot 4. Follow-up with ID, podiatry and vein and vascular 5. Follow-up in 1 week Holly [name], Holly [name] ([id number]) [id number] Page 7 of 8 Electronic Signature(s) Signed: 10/07/2022 1:14:22 PM By: Geralyn Corwin DO Entered By: Geralyn Corwin on 10/07/2022 11:06:09 -------------------------------------------------------------------------------- ROS/PFSH Details Patient Name: Date of Service: Holly [name], Holly Stanley M. 10/07/2022 8:45 A M Medical Record Number: [id number] Patient Account Number: [id number] Date of Birth/Sex: Treating RN: 30-Apr-1965 (57 y.o. Freddy Finner Primary Care  Provider: PA Zenovia Jordan, NO Other Clinician: Referring Provider: Treating Provider/Extender: Geralyn Corwin FA RRA R, [place] NDA CE Weeks in Treatment: 0 Genitourinary Complaints and Symptoms: Positive for: Kidney failure/ Dialysis - tues thur sat ( 945am to 230 pm Medical History: Positive for: End Stage Renal Disease Integumentary (Skin) Complaints and Symptoms: Positive for: Wounds Cardiovascular Medical History: Positive for: Congestive Heart Failure; Hypertension; Peripheral Venous Disease Endocrine Medical History: Positive for: Type II Diabetes Time with diabetes: 23 Treated with: Insulin Immunizations Pneumococcal Vaccine: Received Pneumococcal Vaccination: No Implantable Devices None Family and Social History Never smoker; Marital Status - Married; Alcohol Use: Rarely; Drug Use: No History; Caffeine Use: Daily Electronic Signature(s) Signed: 10/07/2022 1:14:22 PM By: Geralyn Corwin DO Signed: 10/09/2022 1:15:19 PM By: Yevonne Pax RN Entered By: Yevonne Pax on 10/07/2022 09:14:29 Holly [name] ([id number]) [id number] Page 8 of 8 -------------------------------------------------------------------------------- SuperBill Details Patient Name: Date of Service: Holly [name], Holly [name]. 10/07/2022 Medical Record Number: [id number] Patient Account Number: [id number] Date of Birth/Sex: Treating RN: 1965/12/12 (57 y.o. 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) I73.9 Peripheral vascular disease, unspecified Facility Procedures : CPT4 Code: 59563875 Description: 64332 - WOUND CARE  VISIT-LEV 2 EST PT Modifier: Quantity: 1 : CPT4 Code: 95188416 Description: 11042 - DEB SUBQ TISSUE 20 SQ CM/< ICD-10 Diagnosis Description 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 E11.621 Type 2 diabetes mellitus with foot ulcer Modifier: ity Quantity: 1 Physician Procedures : CPT4 Code Description Modifier 6063016 99204 - WC PHYS LEVEL 4 - NEW PT 25 ICD-10 Diagnosis Description 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ity E11.621 Type 2 diabetes mellitus with foot ulcer  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  ADRIENN, SHIFFMAN [place] ([id number]) [id number] Page 1 of 8 Visit Report for 10/07/2022 Chief Complaint Document Details Patient Name: Date of Service: Holly [name], Holly [name]. 10/07/2022 8:45 A M Medical Record Number: [id number] Patient Account Number: [id number] Date of Birth/Sex: Treating RN: 1966-01-03 (57 y.o. Freddy Finner Primary Care Provider: PA Zenovia Jordan, [place] Other Clinician: Referring Provider: Treating Provider/Extender: Geralyn Corwin FA RRA R, [place] NDA CE Weeks in Treatment: 0 Information Obtained from: Patient Chief Complaint 10/07/2022; right foot wound Electronic Signature(s) Signed: 10/07/2022 1:14:22 PM By: Geralyn Corwin DO Entered By: Geralyn Corwin on 10/07/2022 10:24:29 -------------------------------------------------------------------------------- Debridement Details Patient Name: Date of Service: Holly [name], Holly [name]. 10/07/2022 8:45 A M Medical Record Number: [id number] Patient Account Number: [id number] Date of Birth/Sex: Treating RN: 11-May-1965 (57 y.o. Freddy Finner Primary Care Provider: PA Zenovia Jordan, NO Other Clinician: Referring Provider: Treating Provider/Extender: Geralyn Corwin FA RRA R, [place] NDA CE Weeks in Treatment: 0 Debridement Performed for Assessment: Wound #1 Left Foot Performed By: Physician Geralyn Corwin, MD Debridement Type: Debridement Level of Consciousness (Pre-procedure): Awake and Alert Pre-procedure Verification/Time Out Yes - 09:45 Taken: Start Time: 09:45 Percent of Wound Bed Debrided: 100% T Area Debrided (cm): otal 16.48 Tissue and other material debrided: Viable, Non-Viable, Slough, Subcutaneous, Skin: Dermis , Skin: Epidermis, Slough Level: Skin/Subcutaneous Tissue Debridement Description: Excisional Instrument: Curette Bleeding: Moderate Hemostasis Achieved: Silver Nitrate End Time: 09:50 Procedural [name]: 0 Post Procedural [name]: 0 Response to Treatment: Procedure was tolerated well Holly [name]  ([id number]) [id number] Page 2 of 8 Level of Consciousness (Post- Awake and Alert procedure): Post Debridement Measurements of Total Wound Length: (cm) 7 Width: (cm) 3 Depth: (cm) 1.4 Volume: (cm) 23.091 Character of Wound/Ulcer Post Debridement: Improved Post Procedure Diagnosis Same as Pre-procedure Electronic Signature(s) Signed: 10/07/2022 1:14:22 PM By: Geralyn Corwin DO Signed: 10/09/2022 1:15:19 PM By: Yevonne Pax RN Entered By: Yevonne Pax on 10/07/2022 09:55:16 -------------------------------------------------------------------------------- HPI Details Patient Name: Date of Service: Holly [name], Holly Stanley M. 10/07/2022 8:45 A M Medical Record Number: [id number] Patient Account Number: [id number] Date of Birth/Sex: Treating RN: 1965-04-25 (57 y.o. Freddy Finner Primary Care Provider: PA Zenovia Jordan, [place] Other Clinician: Referring Provider: Treating Provider/Extender: Geralyn Corwin FA RRA R, [place] NDA CE Weeks in Treatment: 0 History of Present Illness HPI Description: 10/07/2022 Ms. 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 She has been following with podiatry for right foot wounds for over 6months Requiring a partial second ray amputation on the right foot on 03/31/2022. On 08/12/2022 she visited the ED for worsening right foot wounds and found to have acute osteomyelitis on MRI of the fifth met head. It was also noted that she had decreased ABIs on the right with an ABI of 0.58 and a TBI of 0.18. On 7/22 she underwent balloon angioplasty of the right SFA/AK pop and drug coated balloon angioplasty of the right SFA. She had a transmetatarsal amputation by podiatry on  08/19/2022. She had wound cultures that grew Enterobacter cloacae and VRE. Operative cultures did not yield any organism. ID was consulted and recommended IV daptomycin and cefepime for 6-week course during dialysis. Treatment ended on September 30, 2022. On discharge she had a wound VAC placed to the wound bed and has resided in [place] skilled nursing facility for  the past couple months up until yesterday. The wound VAC was DC'd by the facility. She has not had follow-up with podiatry, infectious disease or vein and vascular since she has been in the skilled nursing facility. She has been using a wet-to-dry dressing over the past day. She currently denies systemic signs of infection. Electronic Signature(s) Signed: 10/07/2022 1:14:22 PM By: Geralyn Corwin DO Entered By: Geralyn Corwin on 10/07/2022 10:53:01 Holly [name] ([id number]) [id number] Page 3 of 8 -------------------------------------------------------------------------------- Physical Exam Details Patient Name: Date of Service: Holly [name], Holly [name] 10/07/2022 8:45 A M Medical Record Number: [id number] Patient Account Number: [id number] Date of Birth/Sex: Treating RN: 1965-02-16 (57 y.o. Freddy Finner Primary Care Provider: PA Zenovia Jordan, [place] Other Clinician: Referring Provider: Treating Provider/Extender: Geralyn Corwin FA RRA R, [place] NDA CE Weeks in Treatment: 0 Constitutional . Cardiovascular . Psychiatric . Notes Large open wound to the lateral aspect of the right foot with granulation tissue although not healthy in appearance and nonviable tissue. There are pockets with increased depth. No probing to bone. No signs of soft tissue infection. Electronic Signature(s) Signed: 10/07/2022 1:14:22 PM By: Geralyn Corwin DO Entered By: Geralyn Corwin on 10/07/2022 10:53:57 -------------------------------------------------------------------------------- Physician Orders Details Patient Name: Date of  Service: Holly [name], Holly [name]. 10/07/2022 8:45 A M Medical Record Number: [id number] Patient Account Number: [id number] Date of Birth/Sex: Treating RN: March 29, 1965 (57 y.o. Freddy Finner Primary Care Provider: PA Zenovia Jordan, [place] Other Clinician: Referring Provider: Treating Provider/Extender: Geralyn Corwin FA RRA R, [place] NDA CE Weeks in Treatment: 0 Verbal / Phone Orders: No Diagnosis Coding ICD-10 Coding Code Description 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ity E11.621 Type 2 diabetes mellitus with foot ulcer I73.9 Peripheral vascular disease, unspecified Follow-up Appointments Return Appointment in 1 week. Bathing/ Shower/ Hygiene May shower with wound dressing protected with water repellent cover or cast protector. Edema Control - Lymphedema / Segmental Compressive Device / Other Elevate, Exercise Daily and A void Standing for Long Periods of Time. Elevate legs to the level of the heart and pump ankles as often as possible Elevate leg(s) parallel to the floor when sitting. Wound Treatment Wound #1 - Foot Wound Laterality: Left Cleanser: [place]ram Ancillary Kit - 15 Day Supply (DME) (Generic) 1 x Per Day/15 Days Discharge Instructions: Use supplies as instructed; Kit contains: (15) Saline Bullets; (15) 3x3 Gauze; 15 pr Gloves Holly [name] ([id number]) [id number] Page 4 of 8 Cleanser: Dakin 16 (oz) 0.25 1 x Per Day/15 Days Discharge Instructions: Use as directed. Prim Dressing: Gauze (DME) (Generic) 1 x Per Day/15 Days ary Discharge Instructions: moistened with vashe Secondary Dressing: ABD Pad 5x9 (in/in) (DME) (Generic) 1 x Per Day/15 Days Discharge Instructions: Cover with ABD pad Secured With: Medipore T - 22M Medipore H Soft Cloth Surgical T ape ape, 2x2 (in/yd) (DME) (Generic) 1 x Per Day/15 Days Secured With: Kerlix Roll Sterile or Non-Sterile 6-ply 4.5x4 (yd/yd) (DME) (Generic) 1 x Per Day/15  Days Discharge Instructions: Apply Kerlix as directed Electronic Signature(s) Signed: 10/07/2022 1:14:22 PM By: Geralyn Corwin DO Signed: 10/09/2022 1:15:19 PM By: Yevonne Pax RN Entered By: Yevonne Pax on 10/07/2022 09:59:45 -------------------------------------------------------------------------------- Problem List Details Patient Name: Date of Service: Holly [name], Holly Stanley M. 10/07/2022 8:45 A M Medical Record Number: [id number] Patient Account Number: [id number] Date of Birth/Sex: Treating RN: 1966/01/19 (57 y.o. Freddy Finner Primary Care Provider: PA Zenovia Jordan, [place] Other Clinician: Referring Provider: Treating Provider/Extender: Geralyn Corwin FA RRA R, [place] NDA CE Weeks in  Discharge Instructions: moistened with vashe Secondary Dressing: ABD Pad 5x9 (in/in) (DME) (Generic) 1 x Per Day/15 Days Discharge Instructions: Cover with ABD pad Secured With: Medipore T - 72M Medipore H Soft Cloth Surgical T ape ape, 2x2 (in/yd) (DME) (Generic) 1 x Per Day/15 Days Secured With: Kerlix Roll Sterile or Non-Sterile 6-ply 4.5x4 (yd/yd) (DME) (Generic) 1 x Per Day/15 Days Discharge Instructions: Apply Kerlix as directed 1. In office sharp debridement 2. Vashe wet-to-dry dressings 3. Aggressive offloadingoffloading boot 4. Follow-up with ID, podiatry and vein and vascular 5. Follow-up in 1 week Holly [name], Holly [name] ([id number]) [id number] Page 7 of 8 Electronic Signature(s) Signed: 10/07/2022 1:14:22 PM By: Geralyn Corwin DO Entered By: Geralyn Corwin on 10/07/2022 11:06:09 -------------------------------------------------------------------------------- ROS/PFSH Details Patient Name: Date of Service: Holly [name], Holly Stanley M. 10/07/2022 8:45 A M Medical Record Number: [id number] Patient Account Number: [id number] Date of Birth/Sex: Treating RN: 30-Apr-1965 (57 y.o. Freddy Finner Primary Care  Provider: PA Zenovia Jordan, [place] Other Clinician: Referring Provider: Treating Provider/Extender: Geralyn Corwin FA RRA R, [place] NDA CE Weeks in Treatment: 0 Genitourinary Complaints and Symptoms: Positive for: Kidney failure/ Dialysis - tues thur sat ( 945am to 230 pm Medical History: Positive for: End Stage Renal Disease Integumentary (Skin) Complaints and Symptoms: Positive for: Wounds Cardiovascular Medical History: Positive for: Congestive Heart Failure; Hypertension; Peripheral Venous Disease Endocrine Medical History: Positive for: Type II Diabetes Time with diabetes: 23 Treated with: Insulin Immunizations Pneumococcal Vaccine: Received Pneumococcal Vaccination: No Implantable Devices None Family and Social History Never smoker; Marital Status - Married; Alcohol Use: Rarely; Drug Use: No History; Caffeine Use: Daily Electronic Signature(s) Signed: 10/07/2022 1:14:22 PM By: Geralyn Corwin DO Signed: 10/09/2022 1:15:19 PM By: Yevonne Pax RN Entered By: Yevonne Pax on 10/07/2022 09:14:29 Holly [name] ([id number]) [id number] Page 8 of 8 -------------------------------------------------------------------------------- SuperBill Details Patient Name: Date of Service: Holly [name], Holly [name]. 10/07/2022 Medical Record Number: [id number] Patient Account Number: [id number] Date of Birth/Sex: Treating RN: 1965/12/12 (57 y.o. 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) I73.9 Peripheral vascular disease, unspecified Facility Procedures : CPT4 Code: 59563875 Description: 64332 - WOUND CARE  VISIT-LEV 2 EST PT Modifier: Quantity: 1 : CPT4 Code: [id number] Description: 11042 - DEB SUBQ TISSUE 20 SQ CM/< ICD-10 Diagnosis Description 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 E11.621 Type 2 diabetes mellitus with foot ulcer Modifier: ity Quantity: 1 Physician Procedures : CPT4 Code Description Modifier [id number] 99204 - WC PHYS LEVEL 4 - NEW PT 25 ICD-10 Diagnosis Description 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ity E11.621 Type 2 diabetes mellitus with foot ulcer  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  Discharge Instructions: moistened with vashe Secondary Dressing: ABD Pad 5x9 (in/in) (DME) (Generic) 1 x Per Day/15 Days Discharge Instructions: Cover with ABD pad Secured With: Medipore T - 72M Medipore H Soft Cloth Surgical T ape ape, 2x2 (in/yd) (DME) (Generic) 1 x Per Day/15 Days Secured With: Kerlix Roll Sterile or Non-Sterile 6-ply 4.5x4 (yd/yd) (DME) (Generic) 1 x Per Day/15 Days Discharge Instructions: Apply Kerlix as directed 1. In office sharp debridement 2. Vashe wet-to-dry dressings 3. Aggressive offloadingoffloading boot 4. Follow-up with ID, podiatry and vein and vascular 5. Follow-up in 1 week Holly [name], Holly [name] ([id number]) [id number] Page 7 of 8 Electronic Signature(s) Signed: 10/07/2022 1:14:22 PM By: Geralyn Corwin DO Entered By: Geralyn Corwin on 10/07/2022 11:06:09 -------------------------------------------------------------------------------- ROS/PFSH Details Patient Name: Date of Service: Holly [name], Holly Stanley M. 10/07/2022 8:45 A M Medical Record Number: [id number] Patient Account Number: [id number] Date of Birth/Sex: Treating RN: 30-Apr-1965 (57 y.o. Freddy Finner Primary Care  Provider: PA Zenovia Jordan, [place] Other Clinician: Referring Provider: Treating Provider/Extender: Geralyn Corwin FA RRA R, [place] NDA CE Weeks in Treatment: 0 Genitourinary Complaints and Symptoms: Positive for: Kidney failure/ Dialysis - tues thur sat ( 945am to 230 pm Medical History: Positive for: End Stage Renal Disease Integumentary (Skin) Complaints and Symptoms: Positive for: Wounds Cardiovascular Medical History: Positive for: Congestive Heart Failure; Hypertension; Peripheral Venous Disease Endocrine Medical History: Positive for: Type II Diabetes Time with diabetes: 23 Treated with: Insulin Immunizations Pneumococcal Vaccine: Received Pneumococcal Vaccination: No Implantable Devices None Family and Social History Never smoker; Marital Status - Married; Alcohol Use: Rarely; Drug Use: No History; Caffeine Use: Daily Electronic Signature(s) Signed: 10/07/2022 1:14:22 PM By: Geralyn Corwin DO Signed: 10/09/2022 1:15:19 PM By: Yevonne Pax RN Entered By: Yevonne Pax on 10/07/2022 09:14:29 Holly [name] ([id number]) [id number] Page 8 of 8 -------------------------------------------------------------------------------- SuperBill Details Patient Name: Date of Service: Holly [name], Holly [name]. 10/07/2022 Medical Record Number: [id number] Patient Account Number: [id number] Date of Birth/Sex: Treating RN: 1965/12/12 (57 y.o. 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) I73.9 Peripheral vascular disease, unspecified Facility Procedures : CPT4 Code: [id number] Description: 64332 - WOUND CARE  VISIT-LEV 2 EST PT Modifier: Quantity: 1 : CPT4 Code: [id number] Description: 11042 - DEB SUBQ TISSUE 20 SQ CM/< ICD-10 Diagnosis Description 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 E11.621 Type 2 diabetes mellitus with foot ulcer Modifier: ity Quantity: 1 Physician Procedures : CPT4 Code Description Modifier [id number] 99204 - WC PHYS LEVEL 4 - NEW PT 25 ICD-10 Diagnosis Description 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ity E11.621 Type 2 diabetes mellitus with foot ulcer  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

## 2022-10-14 ENCOUNTER — Encounter: Payer: Commercial Managed Care - PPO | Admitting: Internal Medicine

## 2022-10-14 DIAGNOSIS — E1151 Type 2 diabetes mellitus with diabetic peripheral angiopathy without gangrene: Secondary | ICD-10-CM | POA: Diagnosis not present

## 2022-10-15 NOTE — Progress Notes (Signed)
Date of Service: Holly [name], Holly [name]. 10/14/2022 3:00 PM Medical Record Number: [id number] Patient Account Number: [id number] Date of Birth/Sex: Treating RN: [birth year] (57 y.o. Freddy Finner Primary Care Provider: PA Zenovia Jordan, NO Other Clinician: Referring Provider: Treating Provider/Extender: RO BSO N, MICHA EL G FA RRA R, CA NDA CE Weeks in Treatment: 1 SABREE, EVE ([id number]) [id number] Page 4 of 6 Subjective History of Present Illness (HPI) 10/07/2022 Ms. 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 She has been following with podiatry for right foot wounds for over 6months Requiring a partial second ray amputation on the right foot on 03/31/2022. On 08/12/2022 she visited the ED for worsening right foot wounds and found to have acute osteomyelitis on MRI of the fifth met head. It was also noted that she had decreased ABIs on the right with an ABI of 0.58 and a TBI of 0.18. On 7/22 she underwent balloon angioplasty of the right SFA/AK pop and drug coated balloon angioplasty of the right SFA. She had a transmetatarsal amputation by podiatry on 08/19/2022. She had wound cultures that grew Enterobacter cloacae and VRE. Operative cultures did not yield any organism. ID was consulted and recommended IV daptomycin and cefepime for 6-week course during dialysis. Treatment ended on September 30, 2022. On discharge she had a wound VAC placed to the wound bed and has resided in [place] skilled nursing facility for the past couple months up until  yesterday. The wound VAC was DC'd by the facility. She has not had follow-up with podiatry, infectious disease or vein and vascular since she has been in the skilled nursing facility. She has been using a wet-to-dry dressing over the past day. She currently denies systemic signs of infection. 9/18; this is a complicated woman with type 2 diabetes who has underlying PAD status post revascularization. She developed an infection in her right foot earlier this summer. She received daptomycin during dialysis however she ultimately ended up with a transmetatarsal amputation done by Dr. Lyda Kalata (spo) in [facility]. A large part of this site on the lateral foot is open I will need to check the operative report I wonder if there was more to this and just a transmetatarsal amputation in any case she was sent to a nursing home. There was some suggestion she would require wound VAC or had a wound VAC in the nursing home. Dr. Mikey Bussing did not feel last week that a wound VAC was currently the best choice and she has been using Dakin's wet to dry. She saw her surgeon today who wanted to proceed with a wound VAC but she arrived in the clinic here with a wound vac that we didn't order. She is confused about how to proceed. She had the wound debrided already mechanically today when she was at the podiatry office Objective Constitutional Sitting or standing Blood Pressure is within target range for patient.. Pulse regular and within target range for patient.Marland Kitchen Respirations regular, non-labored and within target range.. Temperature is normal and within the target range for the patient.Marland Kitchen appears in no distress. Vitals Time Taken: 3:18 PM, Height: 66 in, Weight: 155 lbs, BMI: 25, Temperature: 98.5 F, Pulse: 87 bpm, Respiratory Rate: 16 breaths/min, Blood Pressure: 143/63 mmHg. Cardiovascular Both her dorsalis pedis and  EL G FA RRA R, CA NDA CE Weeks in Treatment: 1 Constitutional Sitting or standing Blood Pressure is within target range for patient.. Pulse  regular and within target range for patient.Marland Kitchen Respirations regular, non-labored and HYDI, BATCH ([id number]) [id number] Page 2 of 6 within target range.. Temperature is normal and within the target range for the patient.Marland Kitchen appears in no distress. Cardiovascular Both her dorsalis pedis and posterior tibial pulses are palpable her foot is warm... Notes Wound exam; relatively large open wound on the lateral aspect of her right foot. Under illumination the tissue does not look completely viable at this time. Certainly not healthy looking granulation. There is no evidence of evidence of infection and there is no area that probes to the bone. I did not see anything that probes deeply although the surface of the wound is somewhat irregular. Electronic Signature(s) Signed: 10/14/2022 5:02:29 PM By: Baltazar Najjar MD Entered By: Baltazar Najjar on 10/14/2022 16:32:36 -------------------------------------------------------------------------------- Physician Orders Details Patient Name: Date of Service: Holly [name]. 10/14/2022 3:00 PM Medical Record Number: [id number] Patient Account Number: [id number] Date of Birth/Sex: Treating RN: [birth year] (57 y.o. Freddy Finner Primary Care Provider: PA Zenovia Jordan, NO Other Clinician: Referring Provider: Treating Provider/Extender: RO BSO N, MICHA EL G FA RRA R, CA NDA CE Weeks in Treatment: 1 Verbal / Phone Orders: No Diagnosis Coding Follow-up Appointments Return Appointment in 1 week. Bathing/ Shower/ Hygiene May shower with wound dressing protected with water repellent cover or cast protector. Edema Control - Lymphedema / Segmental Compressive Device / Other Elevate, Exercise Daily and A void Standing for Long Periods of Time. Elevate legs to the level of the heart and pump ankles as often as possible Elevate leg(s) parallel to the floor when sitting. Wound Treatment Wound #1 - Foot Wound Laterality: Left Cleanser:  Byram Ancillary Kit - 15 Day Supply (Generic) 1 x Per Day/15 Days Discharge Instructions: Use supplies as instructed; Kit contains: (15) Saline Bullets; (15) 3x3 Gauze; 15 pr Gloves Prim Dressing: Gauze 1 x Per Day/15 Days ary Discharge Instructions: moistened with Dakins Solution Secondary Dressing: ABD Pad 5x9 (in/in) (Generic) 1 x Per Day/15 Days Discharge Instructions: Cover with ABD pad Secured With: Medipore T - 74M Medipore H Soft Cloth Surgical T ape ape, 2x2 (in/yd) (Generic) 1 x Per Day/15 Days Secured With: Kerlix Roll Sterile or Non-Sterile 6-ply 4.5x4 (yd/yd) (Generic) 1 x Per Day/15 Days Discharge Instructions: Apply Kerlix as directed Electronic Signature(s) Signed: 10/14/2022 5:02:29 PM By: Baltazar Najjar MD Signed: 10/15/2022 1:18:15 PM By: Yevonne Pax RN Entered By: Yevonne Pax on 10/14/2022 16:01:48 Jerline Pain ([id number]) [id number] Page 3 of 6 -------------------------------------------------------------------------------- Problem List Details Patient Name: Date of Service: KRISTALYNN, BITNER. 10/14/2022 3:00 PM Medical Record Number: [id number] Patient Account Number: [id number] Date of Birth/Sex: Treating RN: [birth year] (57 y.o. 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 10/14/2022 3:00 PM Medical Record Number: [id number] Patient Account Number: [id number] Date of Birth/Sex: Treating RN: [birth year] (57 y.o. Freddy Finner Primary Care Provider: PA Zenovia Jordan, NO Other Clinician: Referring Provider: Treating Provider/Extender: RO BSO N, MICHA EL G FA RRA R, CA NDA CE Weeks in Treatment: 1 SABREE, EVE ([id number]) [id number] Page 4 of 6 Subjective History of Present Illness (HPI) 10/07/2022 Ms. 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 She has been following with podiatry for right foot wounds for over 6months Requiring a partial second ray amputation on the right foot on 03/31/2022. On 08/12/2022 she visited the ED for worsening right foot wounds and found to have acute osteomyelitis on MRI of the fifth met head. It was also noted that she had decreased ABIs on the right with an ABI of 0.58 and a TBI of 0.18. On 7/22 she underwent balloon angioplasty of the right SFA/AK pop and drug coated balloon angioplasty of the right SFA. She had a transmetatarsal amputation by podiatry on 08/19/2022. She had wound cultures that grew Enterobacter cloacae and VRE. Operative cultures did not yield any organism. ID was consulted and recommended IV daptomycin and cefepime for 6-week course during dialysis. Treatment ended on September 30, 2022. On discharge she had a wound VAC placed to the wound bed and has resided in [place] skilled nursing facility for the past couple months up until  yesterday. The wound VAC was DC'd by the facility. She has not had follow-up with podiatry, infectious disease or vein and vascular since she has been in the skilled nursing facility. She has been using a wet-to-dry dressing over the past day. She currently denies systemic signs of infection. 9/18; this is a complicated woman with type 2 diabetes who has underlying PAD status post revascularization. She developed an infection in her right foot earlier this summer. She received daptomycin during dialysis however she ultimately ended up with a transmetatarsal amputation done by Dr. Lyda Kalata (spo) in [facility]. A large part of this site on the lateral foot is open I will need to check the operative report I wonder if there was more to this and just a transmetatarsal amputation in any case she was sent to a nursing home. There was some suggestion she would require wound VAC or had a wound VAC in the nursing home. Dr. Mikey Bussing did not feel last week that a wound VAC was currently the best choice and she has been using Dakin's wet to dry. She saw her surgeon today who wanted to proceed with a wound VAC but she arrived in the clinic here with a wound vac that we didn't order. She is confused about how to proceed. She had the wound debrided already mechanically today when she was at the podiatry office Objective Constitutional Sitting or standing Blood Pressure is within target range for patient.. Pulse regular and within target range for patient.Marland Kitchen Respirations regular, non-labored and within target range.. Temperature is normal and within the target range for the patient.Marland Kitchen appears in no distress. Vitals Time Taken: 3:18 PM, Height: 66 in, Weight: 155 lbs, BMI: 25, Temperature: 98.5 F, Pulse: 87 bpm, Respiratory Rate: 16 breaths/min, Blood Pressure: 143/63 mmHg. Cardiovascular Both her dorsalis pedis and  EL G FA RRA R, CA NDA CE Weeks in Treatment: 1 Constitutional Sitting or standing Blood Pressure is within target range for patient.. Pulse  regular and within target range for patient.Marland Kitchen Respirations regular, non-labored and HYDI, BATCH ([id number]) [id number] Page 2 of 6 within target range.. Temperature is normal and within the target range for the patient.Marland Kitchen appears in no distress. Cardiovascular Both her dorsalis pedis and posterior tibial pulses are palpable her foot is warm... Notes Wound exam; relatively large open wound on the lateral aspect of her right foot. Under illumination the tissue does not look completely viable at this time. Certainly not healthy looking granulation. There is no evidence of evidence of infection and there is no area that probes to the bone. I did not see anything that probes deeply although the surface of the wound is somewhat irregular. Electronic Signature(s) Signed: 10/14/2022 5:02:29 PM By: Baltazar Najjar MD Entered By: Baltazar Najjar on 10/14/2022 16:32:36 -------------------------------------------------------------------------------- Physician Orders Details Patient Name: Date of Service: Holly [name]. 10/14/2022 3:00 PM Medical Record Number: [id number] Patient Account Number: [id number] Date of Birth/Sex: Treating RN: [birth year] (57 y.o. Freddy Finner Primary Care Provider: PA Zenovia Jordan, NO Other Clinician: Referring Provider: Treating Provider/Extender: RO BSO N, MICHA EL G FA RRA R, CA NDA CE Weeks in Treatment: 1 Verbal / Phone Orders: No Diagnosis Coding Follow-up Appointments Return Appointment in 1 week. Bathing/ Shower/ Hygiene May shower with wound dressing protected with water repellent cover or cast protector. Edema Control - Lymphedema / Segmental Compressive Device / Other Elevate, Exercise Daily and A void Standing for Long Periods of Time. Elevate legs to the level of the heart and pump ankles as often as possible Elevate leg(s) parallel to the floor when sitting. Wound Treatment Wound #1 - Foot Wound Laterality: Left Cleanser:  Byram Ancillary Kit - 15 Day Supply (Generic) 1 x Per Day/15 Days Discharge Instructions: Use supplies as instructed; Kit contains: (15) Saline Bullets; (15) 3x3 Gauze; 15 pr Gloves Prim Dressing: Gauze 1 x Per Day/15 Days ary Discharge Instructions: moistened with Dakins Solution Secondary Dressing: ABD Pad 5x9 (in/in) (Generic) 1 x Per Day/15 Days Discharge Instructions: Cover with ABD pad Secured With: Medipore T - 74M Medipore H Soft Cloth Surgical T ape ape, 2x2 (in/yd) (Generic) 1 x Per Day/15 Days Secured With: Kerlix Roll Sterile or Non-Sterile 6-ply 4.5x4 (yd/yd) (Generic) 1 x Per Day/15 Days Discharge Instructions: Apply Kerlix as directed Electronic Signature(s) Signed: 10/14/2022 5:02:29 PM By: Baltazar Najjar MD Signed: 10/15/2022 1:18:15 PM By: Yevonne Pax RN Entered By: Yevonne Pax on 10/14/2022 16:01:48 Jerline Pain ([id number]) [id number] Page 3 of 6 -------------------------------------------------------------------------------- Problem List Details Patient Name: Date of Service: KRISTALYNN, BITNER. 10/14/2022 3:00 PM Medical Record Number: [id number] Patient Account Number: [id number] Date of Birth/Sex: Treating RN: [birth year] (57 y.o. 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 10/07/2022 No Yes severity E11.621 Type 2 diabetes mellitus with foot ulcer 10/07/2022 No Yes 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) 10/07/2022 No Yes I73.9 Peripheral vascular disease, unspecified 10/07/2022 No Yes Inactive Problems Resolved Problems Electronic Signature(s) Signed: 10/14/2022 5:02:29 PM By: Baltazar Najjar MD Entered By: Baltazar Najjar on  10/14/2022 16:25:56 -------------------------------------------------------------------------------- Progress Note Details Patient Name:  EL G FA RRA R, CA NDA CE Weeks in Treatment: 1 Constitutional Sitting or standing Blood Pressure is within target range for patient.. Pulse  regular and within target range for patient.Marland Kitchen Respirations regular, non-labored and HYDI, BATCH ([id number]) [id number] Page 2 of 6 within target range.. Temperature is normal and within the target range for the patient.Marland Kitchen appears in no distress. Cardiovascular Both her dorsalis pedis and posterior tibial pulses are palpable her foot is warm... Notes Wound exam; relatively large open wound on the lateral aspect of her right foot. Under illumination the tissue does not look completely viable at this time. Certainly not healthy looking granulation. There is no evidence of evidence of infection and there is no area that probes to the bone. I did not see anything that probes deeply although the surface of the wound is somewhat irregular. Electronic Signature(s) Signed: 10/14/2022 5:02:29 PM By: Baltazar Najjar MD Entered By: Baltazar Najjar on 10/14/2022 16:32:36 -------------------------------------------------------------------------------- Physician Orders Details Patient Name: Date of Service: Holly [name]. 10/14/2022 3:00 PM Medical Record Number: [id number] Patient Account Number: [id number] Date of Birth/Sex: Treating RN: [birth year] (57 y.o. Freddy Finner Primary Care Provider: PA Zenovia Jordan, NO Other Clinician: Referring Provider: Treating Provider/Extender: RO BSO N, MICHA EL G FA RRA R, CA NDA CE Weeks in Treatment: 1 Verbal / Phone Orders: No Diagnosis Coding Follow-up Appointments Return Appointment in 1 week. Bathing/ Shower/ Hygiene May shower with wound dressing protected with water repellent cover or cast protector. Edema Control - Lymphedema / Segmental Compressive Device / Other Elevate, Exercise Daily and A void Standing for Long Periods of Time. Elevate legs to the level of the heart and pump ankles as often as possible Elevate leg(s) parallel to the floor when sitting. Wound Treatment Wound #1 - Foot Wound Laterality: Left Cleanser:  Byram Ancillary Kit - 15 Day Supply (Generic) 1 x Per Day/15 Days Discharge Instructions: Use supplies as instructed; Kit contains: (15) Saline Bullets; (15) 3x3 Gauze; 15 pr Gloves Prim Dressing: Gauze 1 x Per Day/15 Days ary Discharge Instructions: moistened with Dakins Solution Secondary Dressing: ABD Pad 5x9 (in/in) (Generic) 1 x Per Day/15 Days Discharge Instructions: Cover with ABD pad Secured With: Medipore T - 74M Medipore H Soft Cloth Surgical T ape ape, 2x2 (in/yd) (Generic) 1 x Per Day/15 Days Secured With: Kerlix Roll Sterile or Non-Sterile 6-ply 4.5x4 (yd/yd) (Generic) 1 x Per Day/15 Days Discharge Instructions: Apply Kerlix as directed Electronic Signature(s) Signed: 10/14/2022 5:02:29 PM By: Baltazar Najjar MD Signed: 10/15/2022 1:18:15 PM By: Yevonne Pax RN Entered By: Yevonne Pax on 10/14/2022 16:01:48 Jerline Pain ([id number]) [id number] Page 3 of 6 -------------------------------------------------------------------------------- Problem List Details Patient Name: Date of Service: KRISTALYNN, BITNER. 10/14/2022 3:00 PM Medical Record Number: [id number] Patient Account Number: [id number] Date of Birth/Sex: Treating RN: [birth year] (57 y.o. 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

## 2022-10-15 NOTE — Progress Notes (Addendum)
KAYANNA, RIDINGER [place] ([id number]) [id number] Page 1 of 9 Visit Report for 10/14/2022 Arrival Information Details Patient Name: Date of Service: Holly [name], Holly [name]. 10/14/2022 3:00 PM Medical Record Number: [id number] Patient Account Number: [id number] Date of Birth/Sex: Treating RN: [birth year] (57 y.o. Freddy Finner Primary Care Thermon Zulauf: PA TIENT, NO Other Clinician: Referring Jennelle Pinkstaff: Treating Rocio Wolak/Extender: RO BSO N, MICHA EL G FA RRA R, CA NDA CE Weeks in Treatment: 1 Visit Information History Since Last Visit Added or deleted any medications: No Patient Arrived: Ambulatory Any new allergies or adverse reactions: No Arrival Time: 15:18 Had a fall or experienced change in No Accompanied By: self activities of daily living that may affect Transfer Assistance: None risk of falls: Patient Identification Verified: Yes Signs or symptoms of abuse/neglect since last visito No Secondary Verification Process Completed: Yes Hospitalized since last visit: No Patient Requires Transmission-Based Precautions: No Implantable device outside of the clinic excluding No Patient Has Alerts: Yes cellular tissue based products placed in the center Patient Alerts: Diabetic since last visit: Has Dressing in Place as Prescribed: Yes Has Compression in Place as Prescribed: Yes [name] Present Now: No Electronic Signature(s) Signed: 10/15/2022 1:18:15 PM By: Yevonne Pax RN Entered By: Yevonne Pax on 10/14/2022 12:18:31 -------------------------------------------------------------------------------- Clinic Level of Care Assessment Details Patient Name: Date of Service: Holly [name], Holly [name]. 10/14/2022 3:00 PM Medical Record Number: [id number] Patient Account Number: [id number] Date of Birth/Sex: Treating RN: March 16, 1965 (57 y.o. Freddy Finner Primary Care Broghan Pannone: PA Zenovia Jordan, NO Other Clinician: Referring Skyler Carel: Treating Datron Brakebill/Extender: RO BSO N, MICHA EL G FA RRA R, CA NDA  CE Weeks in Treatment: 1 Clinic Level of Care Assessment Items TOOL 4 Quantity Score X- 1 0 Use when only an EandM is performed on FOLLOW-UP visit ASSESSMENTS - Nursing Assessment / Reassessment X- 1 10 Reassessment of Co-morbidities (includes updates in patient status) X- 1 5 Reassessment of Adherence to Treatment Plan Holly [name], Holly [name] ([id number]) [id number] Page 2 of 9 ASSESSMENTS - Wound and Skin A ssessment / Reassessment X - Simple Wound Assessment / Reassessment - one wound 1 5 []  - 0 Complex Wound Assessment / Reassessment - multiple wounds []  - 0 Dermatologic / Skin Assessment (not related to wound area) ASSESSMENTS - Focused Assessment []  - 0 Circumferential Edema Measurements - multi extremities []  - 0 Nutritional Assessment / Counseling / Intervention []  - 0 Lower Extremity Assessment (monofilament, tuning fork, pulses) []  - 0 Peripheral Arterial Disease Assessment (using hand held doppler) ASSESSMENTS - Ostomy and/or Continence Assessment and Care []  - 0 Incontinence Assessment and Management []  - 0 Ostomy Care Assessment and Management (repouching, etc.) PROCESS - Coordination of Care X - Simple Patient / Family Education for ongoing care 1 15 []  - 0 Complex (extensive) Patient / Family Education for ongoing care []  - 0 Staff obtains Chiropractor, Records, T Results / Process Orders est []  - 0 Staff telephones HHA, Nursing Homes / Clarify orders / etc []  - 0 Routine Transfer to another Facility (non-emergent condition) []  - 0 Routine Hospital Admission (non-emergent condition) []  - 0 New Admissions / Manufacturing engineer / Ordering NPWT Apligraf, etc. , []  - 0 Emergency Hospital Admission (emergent condition) X- 1 10 Simple Discharge Coordination []  - 0 Complex (extensive) Discharge Coordination PROCESS - Special Needs []  - 0 Pediatric / Minor Patient Management []  - 0 Isolation Patient Management []  - 0 Hearing /  Language / Visual special needs []  - 0 Assessment of Community assistance (transportation, D/C planning, etc.) []  -  0 Additional assistance / Altered mentation []  - 0 Support Surface(s) Assessment (bed, cushion, seat, etc.) INTERVENTIONS - Wound Cleansing / Measurement X - Simple Wound Cleansing - one wound 1 5 []  - 0 Complex Wound Cleansing - multiple wounds []  - 0 Wound Imaging (photographs - any number of wounds) []  - 0 Wound Tracing (instead of photographs) []  - 0 Simple Wound Measurement - one wound []  - 0 Complex Wound Measurement - multiple wounds INTERVENTIONS - Wound Dressings X - Small Wound Dressing one or multiple wounds 1 10 []  - 0 Medium Wound Dressing one or multiple wounds []  - 0 Large Wound Dressing one or multiple wounds []  - 0 Application of Medications - topical []  - 0 Application of Medications - injection INTERVENTIONS - Miscellaneous []  - 0 External ear exam Holly [name], Holly [name] ([id number]) [id number] Page 3 of 9 []  - 0 Specimen Collection (cultures, biopsies, blood, body fluids, etc.) []  - 0 Specimen(s) / Culture(s) sent or taken to Lab for analysis []  - 0 Patient Transfer (multiple staff / Michiel Sites Lift / Similar devices) []  - 0 Simple Staple / Suture removal (25 or less) []  - 0 Complex Staple / Suture removal (26 or more) []  - 0 Hypo / Hyperglycemic Management (close monitor of Blood Glucose) []  - 0 Ankle / Brachial Index (ABI) - do not check if billed separately X- 1 5 Vital Signs Has the patient been seen at the hospital within the last three years: Yes Total Score: 65 Level Of Care: New/Established - Level 2 Electronic Signature(s) Signed: 10/15/2022 1:18:15 PM By: Yevonne Pax RN Entered By: Yevonne Pax on 10/14/2022 12:54:53 -------------------------------------------------------------------------------- Encounter Discharge Information Details Patient Name: Date of Service: Holly [name], Holly Stanley M. 10/14/2022 3:00  PM Medical Record Number: [id number] Patient Account Number: [id number] Date of Birth/Sex: Treating RN: [birth year] (57 y.o. Freddy Finner Primary Care Aleksis Jiggetts: PA Zenovia Jordan, NO Other Clinician: Referring Cashton Hosley: Treating Acacia Latorre/Extender: RO BSO N, MICHA EL G FA RRA R, CA NDA CE Weeks in Treatment: 1 Encounter Discharge Information Items Discharge Condition: Stable Ambulatory Status: Ambulatory Discharge Destination: Home Transportation: Private Auto Accompanied By: self Schedule Follow-up Appointment: Yes Clinical Summary of Care: Electronic Signature(s) Signed: 10/15/2022 1:18:15 PM By: Yevonne Pax RN Entered By: Yevonne Pax on 10/14/2022 12:56:34 Lower Extremity Assessment Details -------------------------------------------------------------------------------- Holly [name] ([id number]) [id number] Page 4 of 9 Patient Name: Date of Service: Holly [name], Holly [name]. 10/14/2022 3:00 PM Medical Record Number: [id number] Patient Account Number: [id number] Date of Birth/Sex: Treating RN: [birth year] (57 y.o. Freddy Finner Primary Care Tessica Cupo: PA TIENT, NO Other Clinician: Referring Letticia Bhattacharyya: Treating Izora Benn/Extender: RO BSO N, MICHA EL G FA RRA R, CA NDA CE Weeks in Treatment: 1 Edema Assessment Left: Right: Assessed: No No Edema: Yes Calf Left: Right: Point of Measurement: 36 cm From Medial Instep 33 cm Ankle Left: Right: Point of Measurement: 10 cm From Medial Instep 21 cm Knee To Floor Left: Right: From Medial Instep 46 cm Vascular Assessment Left: Right: Pulses: Dorsalis Pedis Palpable: Yes Extremity colors, hair growth, and conditions: Extremity Color: Hyperpigmented Hair Growth on Extremity: No Temperature of Extremity: Warm Capillary Refill: < 3 seconds Dependent Rubor: No Blanched when Elevated: No Lipodermatosclerosis: No Toe Nail Assessment Left: Right: Thick: No Discolored: No Deformed: No Improper Length and Hygiene:  No Electronic Signature(s) Signed: 10/15/2022 1:18:15 PM By: Yevonne Pax RN Entered By: Yevonne Pax on 10/14/2022 12:26:47 -------------------------------------------------------------------------------- Multi Wound Chart Details Patient Name: Date of Service: Holly [name], Holly Stanley M. 10/14/2022 3:00 PM Medical Record  KAYANNA, RIDINGER [place] ([id number]) [id number] Page 1 of 9 Visit Report for 10/14/2022 Arrival Information Details Patient Name: Date of Service: Holly [name], Holly [name]. 10/14/2022 3:00 PM Medical Record Number: [id number] Patient Account Number: [id number] Date of Birth/Sex: Treating RN: [birth year] (57 y.o. Freddy Finner Primary Care Thermon Zulauf: PA TIENT, NO Other Clinician: Referring Jennelle Pinkstaff: Treating Rocio Wolak/Extender: RO BSO N, MICHA EL G FA RRA R, CA NDA CE Weeks in Treatment: 1 Visit Information History Since Last Visit Added or deleted any medications: No Patient Arrived: Ambulatory Any new allergies or adverse reactions: No Arrival Time: 15:18 Had a fall or experienced change in No Accompanied By: self activities of daily living that may affect Transfer Assistance: None risk of falls: Patient Identification Verified: Yes Signs or symptoms of abuse/neglect since last visito No Secondary Verification Process Completed: Yes Hospitalized since last visit: No Patient Requires Transmission-Based Precautions: No Implantable device outside of the clinic excluding No Patient Has Alerts: Yes cellular tissue based products placed in the center Patient Alerts: Diabetic since last visit: Has Dressing in Place as Prescribed: Yes Has Compression in Place as Prescribed: Yes [name] Present Now: No Electronic Signature(s) Signed: 10/15/2022 1:18:15 PM By: Yevonne Pax RN Entered By: Yevonne Pax on 10/14/2022 12:18:31 -------------------------------------------------------------------------------- Clinic Level of Care Assessment Details Patient Name: Date of Service: Holly [name], Holly [name]. 10/14/2022 3:00 PM Medical Record Number: [id number] Patient Account Number: [id number] Date of Birth/Sex: Treating RN: March 16, 1965 (57 y.o. Freddy Finner Primary Care Broghan Pannone: PA Zenovia Jordan, NO Other Clinician: Referring Skyler Carel: Treating Datron Brakebill/Extender: RO BSO N, MICHA EL G FA RRA R, CA NDA  CE Weeks in Treatment: 1 Clinic Level of Care Assessment Items TOOL 4 Quantity Score X- 1 0 Use when only an EandM is performed on FOLLOW-UP visit ASSESSMENTS - Nursing Assessment / Reassessment X- 1 10 Reassessment of Co-morbidities (includes updates in patient status) X- 1 5 Reassessment of Adherence to Treatment Plan Holly [name], Holly [name] ([id number]) [id number] Page 2 of 9 ASSESSMENTS - Wound and Skin A ssessment / Reassessment X - Simple Wound Assessment / Reassessment - one wound 1 5 []  - 0 Complex Wound Assessment / Reassessment - multiple wounds []  - 0 Dermatologic / Skin Assessment (not related to wound area) ASSESSMENTS - Focused Assessment []  - 0 Circumferential Edema Measurements - multi extremities []  - 0 Nutritional Assessment / Counseling / Intervention []  - 0 Lower Extremity Assessment (monofilament, tuning fork, pulses) []  - 0 Peripheral Arterial Disease Assessment (using hand held doppler) ASSESSMENTS - Ostomy and/or Continence Assessment and Care []  - 0 Incontinence Assessment and Management []  - 0 Ostomy Care Assessment and Management (repouching, etc.) PROCESS - Coordination of Care X - Simple Patient / Family Education for ongoing care 1 15 []  - 0 Complex (extensive) Patient / Family Education for ongoing care []  - 0 Staff obtains Chiropractor, Records, T Results / Process Orders est []  - 0 Staff telephones HHA, Nursing Homes / Clarify orders / etc []  - 0 Routine Transfer to another Facility (non-emergent condition) []  - 0 Routine Hospital Admission (non-emergent condition) []  - 0 New Admissions / Manufacturing engineer / Ordering NPWT Apligraf, etc. , []  - 0 Emergency Hospital Admission (emergent condition) X- 1 10 Simple Discharge Coordination []  - 0 Complex (extensive) Discharge Coordination PROCESS - Special Needs []  - 0 Pediatric / Minor Patient Management []  - 0 Isolation Patient Management []  - 0 Hearing /  Language / Visual special needs []  - 0 Assessment of Community assistance (transportation, D/C planning, etc.) []  -  0 Additional assistance / Altered mentation []  - 0 Support Surface(s) Assessment (bed, cushion, seat, etc.) INTERVENTIONS - Wound Cleansing / Measurement X - Simple Wound Cleansing - one wound 1 5 []  - 0 Complex Wound Cleansing - multiple wounds []  - 0 Wound Imaging (photographs - any number of wounds) []  - 0 Wound Tracing (instead of photographs) []  - 0 Simple Wound Measurement - one wound []  - 0 Complex Wound Measurement - multiple wounds INTERVENTIONS - Wound Dressings X - Small Wound Dressing one or multiple wounds 1 10 []  - 0 Medium Wound Dressing one or multiple wounds []  - 0 Large Wound Dressing one or multiple wounds []  - 0 Application of Medications - topical []  - 0 Application of Medications - injection INTERVENTIONS - Miscellaneous []  - 0 External ear exam Holly [name], Holly [name] ([id number]) [id number] Page 3 of 9 []  - 0 Specimen Collection (cultures, biopsies, blood, body fluids, etc.) []  - 0 Specimen(s) / Culture(s) sent or taken to Lab for analysis []  - 0 Patient Transfer (multiple staff / Michiel Sites Lift / Similar devices) []  - 0 Simple Staple / Suture removal (25 or less) []  - 0 Complex Staple / Suture removal (26 or more) []  - 0 Hypo / Hyperglycemic Management (close monitor of Blood Glucose) []  - 0 Ankle / Brachial Index (ABI) - do not check if billed separately X- 1 5 Vital Signs Has the patient been seen at the hospital within the last three years: Yes Total Score: 65 Level Of Care: New/Established - Level 2 Electronic Signature(s) Signed: 10/15/2022 1:18:15 PM By: Yevonne Pax RN Entered By: Yevonne Pax on 10/14/2022 12:54:53 -------------------------------------------------------------------------------- Encounter Discharge Information Details Patient Name: Date of Service: Holly [name], Holly Stanley M. 10/14/2022 3:00  PM Medical Record Number: [id number] Patient Account Number: [id number] Date of Birth/Sex: Treating RN: [birth year] (57 y.o. Freddy Finner Primary Care Aleksis Jiggetts: PA Zenovia Jordan, NO Other Clinician: Referring Cashton Hosley: Treating Acacia Latorre/Extender: RO BSO N, MICHA EL G FA RRA R, CA NDA CE Weeks in Treatment: 1 Encounter Discharge Information Items Discharge Condition: Stable Ambulatory Status: Ambulatory Discharge Destination: Home Transportation: Private Auto Accompanied By: self Schedule Follow-up Appointment: Yes Clinical Summary of Care: Electronic Signature(s) Signed: 10/15/2022 1:18:15 PM By: Yevonne Pax RN Entered By: Yevonne Pax on 10/14/2022 12:56:34 Lower Extremity Assessment Details -------------------------------------------------------------------------------- Holly [name] ([id number]) [id number] Page 4 of 9 Patient Name: Date of Service: Holly [name], Holly [name]. 10/14/2022 3:00 PM Medical Record Number: [id number] Patient Account Number: [id number] Date of Birth/Sex: Treating RN: [birth year] (57 y.o. Freddy Finner Primary Care Tessica Cupo: PA TIENT, NO Other Clinician: Referring Letticia Bhattacharyya: Treating Izora Benn/Extender: RO BSO N, MICHA EL G FA RRA R, CA NDA CE Weeks in Treatment: 1 Edema Assessment Left: Right: Assessed: No No Edema: Yes Calf Left: Right: Point of Measurement: 36 cm From Medial Instep 33 cm Ankle Left: Right: Point of Measurement: 10 cm From Medial Instep 21 cm Knee To Floor Left: Right: From Medial Instep 46 cm Vascular Assessment Left: Right: Pulses: Dorsalis Pedis Palpable: Yes Extremity colors, hair growth, and conditions: Extremity Color: Hyperpigmented Hair Growth on Extremity: No Temperature of Extremity: Warm Capillary Refill: < 3 seconds Dependent Rubor: No Blanched when Elevated: No Lipodermatosclerosis: No Toe Nail Assessment Left: Right: Thick: No Discolored: No Deformed: No Improper Length and Hygiene:  No Electronic Signature(s) Signed: 10/15/2022 1:18:15 PM By: Yevonne Pax RN Entered By: Yevonne Pax on 10/14/2022 12:26:47 -------------------------------------------------------------------------------- Multi Wound Chart Details Patient Name: Date of Service: Holly [name], Holly Stanley M. 10/14/2022 3:00 PM Medical Record

## 2023-01-28 ENCOUNTER — Other Ambulatory Visit: Payer: Self-pay | Admitting: Cardiology

## 2023-01-28 DIAGNOSIS — Z7901 Long term (current) use of anticoagulants: Secondary | ICD-10-CM

## 2023-01-28 DIAGNOSIS — I480 Paroxysmal atrial fibrillation: Secondary | ICD-10-CM

## 2023-02-12 ENCOUNTER — Other Ambulatory Visit: Payer: Self-pay | Admitting: Diagnostic Neuroimaging

## 2023-03-08 ENCOUNTER — Ambulatory Visit: Payer: Commercial Managed Care - PPO | Admitting: Psychiatry

## 2023-04-03 ENCOUNTER — Emergency Department (HOSPITAL_COMMUNITY)

## 2023-04-03 ENCOUNTER — Emergency Department (HOSPITAL_COMMUNITY)
Admission: EM | Admit: 2023-04-03 | Discharge: 2023-04-03 | Discharge status: Against Medical Advice | Attending: Emergency Medicine | Admitting: Emergency Medicine

## 2023-04-03 ENCOUNTER — Encounter (HOSPITAL_COMMUNITY): Payer: Self-pay

## 2023-04-03 ENCOUNTER — Other Ambulatory Visit: Payer: Self-pay

## 2023-04-03 DIAGNOSIS — N186 End stage renal disease: Secondary | ICD-10-CM | POA: Diagnosis not present

## 2023-04-03 DIAGNOSIS — I4891 Unspecified atrial fibrillation: Secondary | ICD-10-CM | POA: Diagnosis not present

## 2023-04-03 DIAGNOSIS — R42 Dizziness and giddiness: Secondary | ICD-10-CM | POA: Diagnosis present

## 2023-04-03 DIAGNOSIS — Z5329 Procedure and treatment not carried out because of patient's decision for other reasons: Secondary | ICD-10-CM | POA: Insufficient documentation

## 2023-04-03 DIAGNOSIS — I959 Hypotension, unspecified: Secondary | ICD-10-CM | POA: Insufficient documentation

## 2023-04-03 DIAGNOSIS — Z794 Long term (current) use of insulin: Secondary | ICD-10-CM | POA: Insufficient documentation

## 2023-04-03 DIAGNOSIS — Z992 Dependence on renal dialysis: Secondary | ICD-10-CM | POA: Diagnosis not present

## 2023-04-03 DIAGNOSIS — R7989 Other specified abnormal findings of blood chemistry: Secondary | ICD-10-CM | POA: Insufficient documentation

## 2023-04-03 LAB — CBC WITH DIFFERENTIAL/PLATELET
Abs Immature Granulocytes: 0.02 10*3/uL (ref 0.00–0.07)
Basophils Absolute: 0.1 10*3/uL (ref 0.0–0.1)
Basophils Relative: 1 %
Eosinophils Absolute: 0.4 10*3/uL (ref 0.0–0.5)
Eosinophils Relative: 5 %
HCT: 31.1 % — ABNORMAL LOW (ref 36.0–46.0)
Hemoglobin: 10 g/dL — ABNORMAL LOW (ref 12.0–15.0)
Immature Granulocytes: 0 %
Lymphocytes Relative: 16 %
Lymphs Abs: 1.1 10*3/uL (ref 0.7–4.0)
MCH: 29.8 pg (ref 26.0–34.0)
MCHC: 32.2 g/dL (ref 30.0–36.0)
MCV: 92.6 fL (ref 80.0–100.0)
Monocytes Absolute: 0.8 10*3/uL (ref 0.1–1.0)
Monocytes Relative: 11 %
Neutro Abs: 4.9 10*3/uL (ref 1.7–7.7)
Neutrophils Relative %: 67 %
Platelets: 135 10*3/uL — ABNORMAL LOW (ref 150–400)
RBC: 3.36 MIL/uL — ABNORMAL LOW (ref 3.87–5.11)
RDW: 14.5 % (ref 11.5–15.5)
WBC: 7.3 10*3/uL (ref 4.0–10.5)
nRBC: 0 % (ref 0.0–0.2)

## 2023-04-03 LAB — BASIC METABOLIC PANEL
Anion gap: 16 — ABNORMAL HIGH (ref 5–15)
BUN: 19 mg/dL (ref 6–20)
CO2: 27 mmol/L (ref 22–32)
Calcium: 8.5 mg/dL — ABNORMAL LOW (ref 8.9–10.3)
Chloride: 94 mmol/L — ABNORMAL LOW (ref 98–111)
Creatinine, Ser: 3.03 mg/dL — ABNORMAL HIGH (ref 0.44–1.00)
GFR, Estimated: 17 mL/min — ABNORMAL LOW (ref >60)
Glucose, Bld: 159 mg/dL — ABNORMAL HIGH (ref 70–99)
Potassium: 3.2 mmol/L — ABNORMAL LOW (ref 3.5–5.1)
Sodium: 137 mmol/L (ref 135–145)

## 2023-04-03 LAB — MAGNESIUM: Magnesium: 1.9 mg/dL (ref 1.7–2.4)

## 2023-04-03 LAB — TROPONIN I (HIGH SENSITIVITY)
Troponin I (High Sensitivity): 84 ng/L — ABNORMAL HIGH (ref <18)
Troponin I (High Sensitivity): 910 ng/L (ref <18)

## 2023-04-03 MED ORDER — LORAZEPAM 1 MG PO TABS
0.5000 mg | ORAL_TABLET | Freq: Once | ORAL | Status: AC
  Administered 2023-04-03: 0.5 mg via ORAL
  Filled 2023-04-03: qty 1

## 2023-04-03 MED ORDER — SODIUM CHLORIDE 0.9 % IV BOLUS
500.0000 mL | Freq: Once | INTRAVENOUS | Status: AC
  Administered 2023-04-03: 500 mL via INTRAVENOUS

## 2023-04-03 NOTE — Discharge Instructions (Signed)
 It is extremely important that you take your Cardizem.  Because you have a you went into atrial fibrillation which caused low blood pressure.  This caused stress on your heart.  You need to take the Cardizem for rate control.  Follow-up with your outpatient physicians and continue with dialysis.  You were offered further consultation and admission and you declined.

## 2023-04-03 NOTE — ED Notes (Signed)
 Ambulated to bathroom with assistance, pt had steady gait but complained of slight dizziness. O2 100 on room air

## 2023-04-03 NOTE — ED Provider Notes (Signed)
 [place] EMERGENCY DEPARTMENT AT [facility] Provider Note   CSN: [id number] Arrival date & time: 04/03/23  1534     History  Chief Complaint  Patient presents with   atrial fib/hypotension   Atrial Fibrillation    Holly [name] is a 58 y.o. female.  HPI   58 year old ESRD patient on HD presents to the emergency department with tachycardia, hypotension and lightheadedness.  Patient completed her full dialysis session when she became lightheaded and was noted to be hypotensive.  She states they took off her typical amount of fluid.  She continues to feel generally weak.  No acute chest or back pain.  She felt fatigued this morning before dialysis but otherwise no acute changes in her health.  She admits that she is noncompliant with her Cardizem that is prescribed for her history of atrial fibrillation.  Not on anticoagulation.  Home Medications Prior to Admission medications   Medication Sig Start Date End Date Taking? Authorizing Provider  diltiazem (CARDIZEM LA) 240 MG 24 hr tablet TAKE 1 TABLET BY MOUTH EVERY DAY 01/28/23   Tolia, Sunit, DO  FLUoxetine (PROZAC) 40 MG capsule Take 40 mg by mouth at bedtime.    [provider]  gabapentin (NEURONTIN) 100 MG capsule TAKE 1-3 CAPSULES (100-300 MG TOTAL) BY MOUTH AT BEDTIME. 02/12/23   Penumalli, Glenford Bayley, MD  gabapentin (NEURONTIN) 300 MG capsule Take 1 capsule (300 mg total) by mouth at bedtime. 06/11/22 12/08/22  Ocie Doyne, MD  insulin glargine, 1 Unit Dial, (TOUJEO SOLOSTAR) 300 UNIT/ML Solostar Pen Inject 10 Units into the skin daily. 03/10/21   [provider]  labetalol (NORMODYNE) 200 MG tablet Take 200 mg by mouth 2 (two) times daily. 04/22/21   [provider]  leptospermum manuka honey (MEDIHONEY) PSTE paste Apply 1 application. topically daily. 04/20/21   Joseph Art, DO  metolazone (ZAROXOLYN) 5 MG tablet Take 5 mg by mouth daily. 04/22/21   [provider]  mupirocin  ointment (BACTROBAN) 2 % Place 1 application. into the nose 2 (two) times daily. 04/20/21   Joseph Art, DO  rOPINIRole (REQUIP) 1 MG tablet TAKE 1 TABLET BY MOUTH DAILY AS NEEDED. 10/06/22   Ocie Doyne, MD  Semaglutide, 1 MG/DOSE, (OZEMPIC, 1 MG/DOSE,) 4 MG/3ML SOPN Inject 1 mg into the skin every Wednesday. 02/20/21   [provider]  sevelamer carbonate (RENVELA) 800 MG tablet Take 800 mg by mouth 3 (three) times daily with meals. 11/28/20   [provider]      Allergies    Bactrim, Byetta 10 mcg pen [exenatide], Hctz [hydrochlorothiazide], Spironolactone, Sulfa antibiotics, Sulfacetamide sodium, and Sulfamethoxazole-trimethoprim    Review of Systems   Review of Systems  Constitutional:  Positive for fatigue. Negative for fever.  Respiratory:  Negative for shortness of breath.   Cardiovascular:  Positive for palpitations. Negative for chest pain and leg swelling.  Gastrointestinal:  Negative for abdominal pain, diarrhea and vomiting.  Skin:  Negative for rash.  Neurological:  Positive for light-headedness. Negative for headaches.    Physical Exam Updated Vital Signs BP (!) 84/50   Pulse (!) 125   Temp 97.6 F (36.4 C) (Oral)   Resp 11   LMP 07/17/2011   SpO2 99%  Physical Exam Vitals and nursing note reviewed.  Constitutional:      Appearance: Normal appearance. She is ill-appearing.  HENT:     Head: Normocephalic.     Mouth/Throat:     Mouth: Mucous membranes are  moist.  Cardiovascular:     Rate and Rhythm: Normal rate.  Pulmonary:     Effort: Pulmonary effort is normal. No respiratory distress.     Breath sounds: No rales.  Abdominal:     Palpations: Abdomen is soft.     Tenderness: There is no abdominal tenderness.  Skin:    General: Skin is warm.  Neurological:     Mental Status: She is alert and oriented to person, place, and time. Mental status is at baseline.  Psychiatric:        Mood and Affect: Mood normal.     ED Results /  Procedures / Treatments   Labs (all labs ordered are listed, but only abnormal results are displayed) Labs Reviewed  CBC WITH DIFFERENTIAL/PLATELET  BASIC METABOLIC PANEL  MAGNESIUM  TROPONIN I (HIGH SENSITIVITY)    EKG EKG Interpretation Date/Time:  Saturday April 03 2023 15:47:56 EST Ventricular Rate:  97 PR Interval:    QRS Duration:  101 QT Interval:  369 QTC Calculation: 469 R Axis:   125  Text Interpretation: Atrial fibrillation Ventricular premature complex Right axis deviation Repol abnrm, severe global ischemia (LM/MVD) Confirmed by Coralee Pesa [phone]) on 04/03/2023 3:49:18 PM  Radiology DG Chest Port 1 View Result Date: 04/03/2023 CLINICAL DATA:  Chest heaviness EXAM: PORTABLE CHEST 1 VIEW COMPARISON:  Chest x-ray 04/24/2021 FINDINGS: The heart is enlarged. Mediastinal silhouette is within normal limits. Both lungs are clear. The visualized skeletal structures are unremarkable. IMPRESSION: Cardiomegaly. No active disease. Electronically Signed   By: Darliss Cheney M.D.   On: 04/03/2023 17:14    Procedures .Critical Care  Performed by: Rozelle Logan, DO Authorized by: Rozelle Logan, DO   Critical care provider statement:    Critical care time (minutes):  30   Critical care was necessary to treat or prevent imminent or life-threatening deterioration of the following conditions:  Circulatory failure   Critical care was time spent personally by me on the following activities:  Development of treatment plan with patient or surrogate, discussions with consultants, evaluation of patient's response to treatment, examination of patient, ordering and review of laboratory studies, ordering and review of radiographic studies, ordering and performing treatments and interventions, pulse oximetry, re-evaluation of patient's condition and review of old charts   I assumed direction of critical care for this patient from another provider in my specialty: no     Care discussed  with: admitting provider       Medications Ordered in ED Medications  sodium chloride 0.9 % bolus 500 mL (500 mLs Intravenous New Bag/Given 04/03/23 1638)  LORazepam (ATIVAN) tablet 0.5 mg (0.5 mg Oral Given 04/03/23 1642)    ED Course/ Medical Decision Making/ A&P                                 Medical Decision Making Amount and/or Complexity of Data Reviewed Labs: ordered. Radiology: ordered.  Risk Prescription drug management.   58 year old ESRD patient on HD presents emergency department with hypotension and atrial fibrillation.  She is supposed to be on Cardizem but admittedly noncompliant.  Patient was given IV fluids with improvement of blood pressure and then she spontaneously converted to sinus.  Blood work was otherwise baseline for the patient, she did have an elevated troponin which increased markedly into the 900s.  Most likely secondary to this episode of A-fib.  She continues to be chest pain-free.  However in  the setting of a troponin with a significant delta I recommended cardiology consult in the setting of receiving IV fluids after dialysis due to ESRD I recommend nephrology consult.  She declines and states that she would not like to stay in the hospital.  She declines any form of admission.  I have recommended that the patient stays in the department for the completion of their workup however they decline.  I have expressed the importance of staying including the risks of leaving which include worsening condition, permanent disability and death.  Patient accepts these risks.  They are alert and oriented, they have capacity to make this decision.  I have not been able to convince the patient to stay and they understand the risks of leaving AGAINST MEDICAL ADVICE.          Final Clinical Impression(s) / ED Diagnoses Final diagnoses:  None    Rx / DC Orders ED Discharge Orders     None         Rozelle Logan, DO 04/03/23 2256

## 2023-04-03 NOTE — ED Triage Notes (Signed)
 Pt BIB [facility] from dialysis center d/y hypotensive and afib. Hx afib. Noncompliant w cardizem. Pt was able to finish the whole treatment. BP was in the 70s despite 1L fluids. C/o feeling dizzy and lightheaded. A&O X4. Afib w HR between 103-130.

## 2023-04-05 ENCOUNTER — Other Ambulatory Visit: Payer: Self-pay | Admitting: Diagnostic Neuroimaging

## 2023-04-06 NOTE — Telephone Encounter (Signed)
 Last seen on 03/02/22 per note "  Return in about 1 year (around 03/03/2023).   No follow up scheduled

## 2023-08-23 NOTE — Progress Notes (Signed)
 [facility] Nephrology Progress Note  Follow up for ESRD.  Subjective: Interval History: No acute events overnight. Feel better.                             Last HD on 08/21/2023.  Postweight of 72.3 kg (159 pounds)              Review of Systems: negative except as noted above  Objective:   Vital signs for last 24 hours: Temp:  [97.9 F (36.6 C)-99 F (37.2 C)] 98 F (36.7 C) Heart Rate:  [64-80] 68 Resp:  [15-18] 18 BP: (96-141)/(43-66) 119/56  Intake/Output last 24 hours:  Intake/Output Summary (Last 24 hours) at 08/23/2023 1817 Last data filed at 08/23/2023 0326 Gross per 24 hour  Intake 480 ml  Output 0 ml  Net 480 ml    Medications: Scheduled Meds: amiodarone, 200 mg, oral, BID colestipol, 1 g, oral, BID DAPTOmycin, 10 mg/kg, intravenous, Q48H diclofenac sodium, 2 g, topical, TID [Held by provider] dilTIAZem , 30 mg, oral, Q12H SCH FLUoxetine , 20 mg, oral, Daily gabapentin , 100 mg, oral, Nightly heparin  (porcine), 5,000 Units, subcutaneous, Q8H SCH hydrocortisone, 1 Application, topical, BID insulin  aspart (NovoLOG )/insulin  lispro (HumaLOG) injection (WF), 0-12 Units, subcutaneous, TID PC levothyroxine, 50 mcg, oral, Daily 0600 melatonin, 6 mg, oral, Once meropenem, 500 mg, intravenous, Q24H midodrine , 15 mg, oral, TID AC rOPINIRole , 1 mg, oral, At Bedtime sevelamer  carbonate, 1,600 mg, oral, TID with meals    PRN Meds: .  acetaminophen  .  dextrose  .  dextrose  .  hydrOXYzine  .  ondansetron  .  oxyCODONE    Physical Exam: General appearance -alert woman in bed.  Not in acute distress. Mouth - mucous membranes moist. Chest -decreased breath sounds.  No rales or wheeze. Heart -s1s2 present. Abdomen - flat,soft, nontender, bs present. Neurological - alert, oriented, normal speech, no focal findings  Musculoskeletal -bilateral TMA amputation. Extremities -no cyanosis.  No lower extremity edema.  No knee tenderness. Skin - normal coloration, no rashes, no  suspicious skin lesions noted.  Access: Right arm AV fistula.  Labs Recent Results (from the past 48 hours)  CBC with Differential   Collection Time: 08/22/23  3:49 AM  Result Value Ref Range   WBC 8.29 4.40 - 11.00 10*3/uL   RBC 3.96 (L) 4.10 - 5.10 10*6/uL   Hemoglobin 11.4 (L) 12.3 - 15.3 g/dL   Hematocrit 62.7 64.0 - 44.6 %   Mean Corpuscular Volume (MCV) 94.0 80.0 - 96.0 fL   Mean Corpuscular Hemoglobin (MCH) 28.7 27.5 - 33.2 pg   Mean Corpuscular Hemoglobin Conc (MCHC) 30.5 (L) 33.0 - 37.0 g/dL   Red Cell Distribution Width (RDW) 20.3 (H) 12.3 - 17.0 %   Platelet Count (PLT) 102 (L) 150 - 450 10*3/uL   Mean Platelet Volume (MPV) 8.3 6.8 - 10.2 fL   Neutrophils % 63 %   Lymphocytes % 19 %   Monocytes % 16 %   Eosinophils % 1 %   Basophils % 1 %   Neutrophils Absolute 5.30 1.80 - 7.80 10*3/uL   Lymphocytes # 1.50 1.00 - 4.80 10*3/uL   Monocytes # 1.30 (H) 0.00 - 0.80 10*3/uL   Eosinophils # 0.10 0.00 - 0.50 10*3/uL   Basophils # 0.10 0.00 - 0.20 10*3/uL  Morphology Review   Collection Time: 08/22/23  3:49 AM  Result Value Ref Range   WBC Morphology Reviewed and Unremarkable   Magnesium   Collection Time: 08/22/23  4:55  AM  Result Value Ref Range   Magnesium 2.1 1.9 - 2.7 mg/dL  Basic Metabolic Panel   Collection Time: 08/22/23  4:55 AM  Result Value Ref Range   Sodium 135 (L) 136 - 145 mmol/L   Potassium 4.5 3.4 - 4.5 mmol/L   Chloride 97 (L) 98 - 107 mmol/L   CO2 25 21 - 31 mmol/L   Anion Gap 13 6 - 14 mmol/L   Glucose, Random 201 (H) 70 - 99 mg/dL   Blood Urea Nitrogen (BUN) 35 (H) 7 - 25 mg/dL   Creatinine 5.77 (H) 9.39 - 1.20 mg/dL   eGFR 11 (L) >40 fO/fpw/8.26f7   Calcium  9.2 8.6 - 10.3 mg/dL   BUN/Creatinine Ratio 8.3 (L) 10.0 - 20.0  POC Glucose   Collection Time: 08/22/23  7:52 AM  Result Value Ref Range   Glucose, POC 191 (H) 70 - 99 mg/dL  POC Glucose   Collection Time: 08/22/23 11:51 AM  Result Value Ref Range   Glucose, POC 339 (H) 70 - 99  mg/dL  POC Glucose   Collection Time: 08/22/23  4:05 PM  Result Value Ref Range   Glucose, POC 37 (LL) 70 - 99 mg/dL  POC Glucose   Collection Time: 08/22/23  4:13 PM  Result Value Ref Range   Glucose, POC 36 (LL) 70 - 99 mg/dL  POC Glucose   Collection Time: 08/22/23  4:15 PM  Result Value Ref Range   Glucose, POC 37 (LL) 70 - 99 mg/dL  POC Glucose   Collection Time: 08/22/23  4:22 PM  Result Value Ref Range   Glucose, POC 105 (H) 70 - 99 mg/dL  POC Glucose   Collection Time: 08/22/23  5:39 PM  Result Value Ref Range   Glucose, POC 116 (H) 70 - 99 mg/dL  POC Glucose   Collection Time: 08/22/23  8:12 PM  Result Value Ref Range   Glucose, POC 113 (H) 70 - 99 mg/dL  POC Glucose   Collection Time: 08/23/23 12:08 AM  Result Value Ref Range   Glucose, POC 192 (H) 70 - 99 mg/dL  POC Glucose   Collection Time: 08/23/23  7:30 AM  Result Value Ref Range   Glucose, POC 236 (H) 70 - 99 mg/dL  POC Glucose   Collection Time: 08/23/23 11:35 AM  Result Value Ref Range   Glucose, POC 101 (H) 70 - 99 mg/dL  POC Glucose   Collection Time: 08/23/23  3:57 PM  Result Value Ref Range   Glucose, POC 234 (H) 70 - 99 mg/dL   Imaging:   Ultrasound abdomen 08/17/2023. IMPRESSION: 1. Diffuse gallbladder wall thickening with pericholecystic fluid and small gallstones. No sonographic Murphy sign. Findings are nonspecific and could be related to hepatic dysfunction, cholecystitis or fluid overload. 2. No biliary dilatation. 3. Trace ascites and small right pleural effusion.  MRI right knee 08/06/2023. IMPRESSION: 1. Large knee effusion with minimal synovitis. No findings of osteomyelitis. 2. Very small Baker's cyst. 3. Substantial degeneration of the posterior horn lateral meniscus near the meniscal root. Cannot readily excluded a degenerative tear. 4. 0.8 cm parameniscal cyst posterior to the posterior horn medial meniscus near the meniscal root. 5. Degeneration in the proximal PCL. 6.  Mild proximal fibular collateral ligament thickening and accentuated signal which could represent a mild sprain or remote injury. 7. Elongated non-fragmented osteochondral lesion of the posterior patellar ridge superiorly with some overlying chondral irregularity. This is thought to be chronic and degenerative in nature. Otherwise moderate  chondral thinning along the patellar facets. 8. Regional muscular atrophy.  MRI left knee 08/06/2023. IMPRESSION: 1. 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 2. Moderate knee effusion. No specific secondary signs such as subcortical marrow edema or synovitis to further indicate that this knee effusion is infected/septic, although this does not entirely rule out the possibility infection if there is a high clinical suspicion. 3. Likely degenerative tearing of the posterior horn of the lateral meniscus near the meniscal root. 4. Regional muscular atrophy.  X-ray right wrist 08/05/2023. 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 Prominent marginal and juxta-articular erosions contributing to destruction of the left first carpometacarpal joint, most pronounced at the base of the first metacarpal with suspected palmar and radial subluxation. Less pronounced erosive changes of the right scaphoid and base of the right first metacarpal. Moderate right first through fourth and left second and third [facility] joint space narrowing. Of note the recent right hand radiograph dated 07/26/2023 demonstrated terminal tuft resorption of the fourth greater than third distal phalanges. 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 Clinical correlation  and serologic workup are recommended   Chest x-ray 08/04/2023. IMPRESSION: Cardiomegaly.  No active disease.    Assessment/Plan:  1) ESRD.     Last HD on 08/21/2023.  Postweight of 72.3 kg (159 pounds).     Fluid restrict to < 1500 mls/day.     Monitor BMP, I/O.     Continue TTS HD schedule.     Next HD on 08/24/2023.   2) Bilateral knee pain with effusion.     S/p arthrocentesis. Negative cultures.     Improved.     Continue IV antibiotics.   3) Right fourth finger osteomyelitis.       Continue IV Daptomycin and Meropenem.     ID follow up appreciated.     Monitor wbc/temp.    4) Type 2 DM.      Monitor FSG.      Insulin  coverage.     5) Shock.     Improved.     Continue PO Midodrine  15 mg TID.     Monitor BP.   6) SVT.     Rate controlled.     On PO Midodrine .      LOS: 17 days    Eulas Manes MD, FACP 08/23/2023 6:17 PM

## 2023-08-24 NOTE — Discharge Summary (Addendum)
 Hospitalist  Discharge Summary   Name: Holly [name] Age: 57 yrs  MRN: [id number] DOB: 03-31-1965  Admit Date: 08/04/2023 Admitting Physician: Lane Lonna Von Jadine, MD  Discharge Date: 08/24/2023 Discharge Physician: Roddie Curtistine Hamilton, MD   Admission Diagnosis:   Fever of unknown origin [R50.9] Body aches [R52] End stage renal disease on dialysis    (CMD) [N18.6, Z99.2] Low grade fever [R50.9] SVT (supraventricular tachycardia) (CMD) [I47.10]   Discharge Diagnoses:   Principal Problem (Resolved):   Low grade fever Active Problems:   Essential hypertension, benign   Diabetic polyneuropathy associated with type 2 diabetes mellitus (HCC)   ESRD on hemodialysis    (CMD)   Insulin  dependent type 2 diabetes mellitus    (CMD)   Osteomyelitis of finger of right hand    (CMD)   Ambulatory dysfunction   Polyarthralgia Resolved Problems:   SVT (supraventricular tachycardia) (CMD)   TO DO List at Follow-up for PCP/Specialist:   Key Medication changes:  Start midodrine  15 mg TID AC (consider weaning based on BP trend) Stop flagyl Start IV daptomycin 675 mg Q48h EOT 09/06/23 Start IV meropenem 500 mg daily EOT 09/06/23 Hold diltiazem  until SNF physician follow up (BP) Pending labs to follow up on:  Weekly CBC diff, CMP, CK and ESR while on antibiotic therapy Please fax results to [phone] Incidental findings requiring follow-up: N/A Follow up with SNF physician BP check Weaning midodrine  if able Restart diltiazem  If able Follow up final fungal cultures Ambulatory referral to cardiology placed Critical illness afib Zio patch to be mailed for placement at SNF Ambulatory referral to rheumatology placed Wound care recs: Gluteal cleft: Cleanse with VASHE wound cleanser Pat dry Keep patient dry Apply zinc barrier cream twice daily Use coviden pads Waffle cushion Ankles: Mepilex borders to bilateral heel, including right lateral ankle Provide pressure  redistribution Change MWF PT recs 3x weekly: Patient/caregiver education, therapeutic activity, therapeutic exercise, gait/mobility training, self care home management, wheelchair management/mobility OT recs 2x weekly: Basic activities of daily living, patient/caregiver education, therapeutic activities, energy conservation training, therapeutic exercises, equipment training, functional mobility training   Hospital Course:   For full details, please see H&P, progress notes, consult notes and ancillary notes. Briefly, Holly [name] is a 57 y/o female with PMH insulin -dependent type 2 diabetes, hypertension, h/o moderate mitral stenosis/mild to moderate tricuspid regurgitation, diabetic peripheral neuropathy, history of foot osteomyelitis, previous transmetatarsal amputations of both feet, ESRD on TTS schedule of dialysis who presented with right fourth finger osteomyelitis. The patient's hospital course will be summarized in a problem based approach below.    Right 4th finger OM c/b septic shock Bilateral Knee Pain and Effusions Pt was being managed for known fourth finger OM and was on antibiotics with flagyl, vancomycin  and cefepime . She presented for this admission with ipsilateral wrist pain, bilateral knee pain. Both knees were drained due to concern for possible septic arthritis but synovial cultures were negative. Cell counts were noted to be elevated. ID was consulted and pt was transitioned to daptomycin and cefepime  with planned EOT 09/06/23. Pt did require ICU level of care for part of her stay due to development of septic shock requiring pressors but pt was able to be weaned off pressors with antibiotic therapy and was transferred back to the floor. Pt did continue to require midodrine  during her stay to maintain adequate BP. Will continue midodrine  on discharge and hold diltiazem  with plan for medication adjustments in outpatient setting. Pt underwent negative autoimmune workup except for  RF factor positive at 14.8. Quant gold was normal. Fungitell was elevated at 497 but fungal cultures were negative and pt clinically improved with antibiotic therapy. Blood cultures this admission without growth but suspect antibiotic failure as etiology of septic shock in setting of known OM. Will refer to rheumatology on discharge given positive RF and bilateral joint effusions without identified infectious source.    Afib RVR Occurred in setting of sepsis. Attributed to acute illness and anticoagulation was not initiated. Started on amiodarone inpatient which I will discontinue on discharge. Will have zio mailed for placement at SNF to assess afib burden and refer patient to follow up with cardiology as an outpatient. Afib did not recur after transfer to floor.    The patient's chronic medical conditions were treated accordingly per the patient's home medication regimen except as noted in the plan above and in the medication list below.    Discharge Condition:   Disposition: Patient discharged to Skilled Nursing Facility (CMS Certified) in fair condition.  Diet at discharge: Adult Diet- Renal; Dialysis (K/Phos/Na restricted) Nepro; Twice daily with Breakfast/Supper  Activity at Discharge: Ambulate ad lib  Wound/Incision Care Instructions: as above  Photo of wound/incision:       Wound 08/05/23  Finger D4, fourth Anterior;Right (Active)  Date First Assessed: 08/05/23   Pre-Existing Wound: Yes  Primary Wound Type: (c)   Location: Finger D4, fourth  Wound Location Orientation: Anterior;Right     Wound 08/15/23 Pressure Injury Perineum (Active)  Date First Assessed/Time First Assessed: 08/15/23 0800   Pre-Existing Wound: No  Primary Wound Type: Pressure Injury  Location: Perineum  Wound Description (Comments): small open area and redness in perineal/sacral area. see photograph sacral wound     Wound 08/20/23 Foot Anterior;Left (Active)  Date First Assessed/Time First Assessed:  08/20/23 0105   Location: Foot  Wound Location Orientation: Anterior;Left     Wound 08/20/23 Foot Anterior;Right (Active)  Date First Assessed/Time First Assessed: 08/20/23 0106   Location: Foot  Wound Location Orientation: Anterior;Right     Physical Exam at Discharge   BP (!) 78/36 (BP Location: Left arm, Patient Position: Lying)   Pulse 71   Temp 98.3 F (36.8 C) (Oral)   Resp 16   Ht 1.676 m (5' 5.98)   Wt 76.5 kg (168 lb 10.4 oz)   SpO2 99%   BMI 28.07 kg/m  CONSTITUTIONAL: alert, NAD CARDIOVASCULAR: normal rate, regular rhythm; systolic murmur RESPIRATORY: normal respiratory effort ABDOMEN: normal bowel sounds, soft, non-tender, non-distended NEURO: spontaneously moves all extremities    Discharge Medications:       Medication List     PAUSE taking these medications    dilTIAZem  30 mg immediate release tablet Wait to take this until your doctor or other care provider tells you to start again. Commonly known as: CARDIZEM  Take 1 tablet by mouth in the morning and 1 tablet in the evening.       START taking these medications    midodrine  5 mg tablet Commonly known as: PROAMATINE  Take 3 tablets (15 mg total) by mouth in the morning and 3 tablets (15 mg total) at noon and 3 tablets (15 mg total) in the evening. Take before meals.       CONTINUE taking these medications    colestipol 1 gram tablet Commonly known as: COLESTID TAKE 1 TABLET (1 GRAM DOSE) BY MOUTH TWICE A DAY   cyclobenzaprine 5 mg tablet Commonly known as: FLEXERIL Take 5 mg by mouth nightly as needed.  ergocalciferol 1,250 mcg (50,000 unit) capsule Commonly known as: VITAMIN D2 Hold and follow up with primary care/nephrologist to determine if need   FLUoxetine  20 mg tablet Commonly known as: PROzac  Take 20 mg by mouth daily.   gabapentin  100 mg capsule Commonly known as: NEURONTIN  Take 300 mg by mouth nightly.   levothyroxine 50 mcg tablet Commonly known as: SYNTHROID Take  1 tablet by mouth daily.      Ozempic 2 mg/dose (8 mg/3 mL) subcutaneous pen injector Generic drug: semaglutide Inject 2 mg under the skin once a week.   rOPINIRole  1 mg tablet Commonly known as: REQUIP  Take 1 tablet by mouth nightly on non-dialysis days and then take 1 tablet during the day on dialysis days and 1 tablet at night on dialysis days   sevelamer  carbonate 800 mg tablet Commonly known as: RENVELA  Take 1,600 mg by mouth in the morning and 1,600 mg at noon and 1,600 mg in the evening. Take with meals.   Toujeo  SoloStar U-300 Insulin  300 unit/mL (1.5 mL) Pen pen Generic drug: insulin  glargine U-300 Inject 15 Units under the skin daily.   Vashe 0.033 % Irsl irrigation solution Generic drug: sodium chlor-hypochlorous acid Apply 1 Application topically daily.       STOP taking these medications    metroNIDAZOLE 500 mg tablet Commonly known as: FLAGYL         Where to Get Your Medications     Information about where to get these medications is not yet available   Ask your nurse or doctor about these medications midodrine  5 mg tablet     Significant Diagnostic Tests:   LABS:  Lab Results  Component Value Date   WBC 6.16 08/24/2023   HGB 10.5 (L) 08/24/2023   HCT 34.1 (L) 08/24/2023   PLT 144 (L) 08/24/2023   CHOL 152 08/14/2022   TRIG 164 (H) 08/14/2022   HDL 33 (L) 08/14/2022   ALT 72 (H) 08/17/2023   AST 304 (H) 08/17/2023   NA 130 (L) 08/24/2023   K 4.9 (H) 08/24/2023   CL 93 (L) 08/24/2023   CREATININE 5.93 (H) 08/24/2023   BUN 58 (H) 08/24/2023   CO2 25 08/24/2023   TSH 2.781 08/06/2023   INR 1.40 02/15/2022   HGBA1C 6.8 (A) 06/09/2023   IMAGING:  XR Chest 1 View  Final Result by Franky Marcey Crease, MD (07/10 0015)  CLINICAL DATA:  Fever    EXAM:  CHEST  1 VIEW    COMPARISON:  04/03/2023    FINDINGS:  Mild cardiomegaly. Linear scarring or atelectasis in the lingula,  unchanged. Right lung clear. No effusions or edema. No acute  bony  abnormality. Aortic atherosclerosis.    IMPRESSION:  Cardiomegaly.  No active disease.      Electronically Signed    By: Franky Crease M.D.    On: 08/05/2023 00:15         Consults:   IP CONSULT TO HOSPITALIST IP CONSULT TO INFECTIOUS DISEASES IP CONSULT TO NEPHROLOGY IP CONSULT TO ORTHOPEDIC SURGERY WOUND OSTOMY EVAL AND TREAT   Follow-up Appointments:    No future appointments.       Electronically signed by: Roddie Curtistine Hamilton, MD 08/24/2023 1:34 PM Time spent on discharge: 45 minutes.

## 2023-08-24 NOTE — Progress Notes (Addendum)
 Case Management Update  Date: 08/24/2023   Time: 3:25 PM   Patient Type: Inpatient  Patient is discharging today to [facility]. She was aware of this yesterday.  Bedside nurse can call report to [phone], option 0 for report - room 704.  I'm waiting for her to come back from dialysis for transport.   ADDENDUM 1535 - I saw her in the hallway on her way back to her room. She is aware of discharge today. I have faxed the Discharge Summary and Nephrology Note 08/24/2023 to her dialysis center [phone]  Post Acute Placement Status: Coordination complete.   Case Management Coordination Status: Coordination In-Progress    Anticipated Discharge Location: Skilled Nursing Facility - short-term rehab  If Plan A discharging location is not feasible: Potential Plan B: Skilled Nursing Facility - short-term rehab        .SABRASABRALauraine JINNY Lipoma RN Case Manager, MSN, CNL, MedSurg-BC, [phone])  Lauraine JINNY Lipoma, RN

## 2023-08-24 NOTE — Procedures (Signed)
 [facility] Dialysis Progress Note    Procedure Date:  08/24/23        Patient Holly [name] was seen and supervised on hemodialysis by me.  Interval History: Feels well.  No acute complaints overnight.  Dialysis prescription Access Type: Right arm AV fistula. Dialyzer: Optiflux 200 NR. Dialysate: 2.0 K+, 2.5 Ca++  Dialysate flow: 300 mL/min  Blood flow up to: 400 mL/min  Ultrafiltration goal: up to dry weight as tolerated by blood pressure.  Time: 3.5 hours.    Access site:    Vitals: Temp:  [97.7 F (36.5 C)-98.4 F (36.9 C)] 98.3 F (36.8 C) Heart Rate:  [65-97] 70 Resp:  [14-18] 16 BP: (90-154)/(34-72) 90/34   Physical Exam: General appearance - alert woman in bed. Not in acute distress. Mouth - mucous membranes moist. Chest -decreased breath sounds.  No rales or wheeze. Heart -s1s2 present. Abdomen - flat,soft, nontender, bs present. Neurological - alert, oriented, normal speech, no focal findings  Musculoskeletal -bilateral TMA amputation. Extremities -no cyanosis.  No lower extremity edema.  Skin - normal coloration, no rashes, no suspicious skin lesions noted.   Access: Right arm AV fistula.    Labs Recent Results (from the past 48 hours)  POC Glucose   Collection Time: 08/22/23  4:05 PM  Result Value Ref Range   Glucose, POC 37 (LL) 70 - 99 mg/dL  POC Glucose   Collection Time: 08/22/23  4:13 PM  Result Value Ref Range   Glucose, POC 36 (LL) 70 - 99 mg/dL  POC Glucose   Collection Time: 08/22/23  4:15 PM  Result Value Ref Range   Glucose, POC 37 (LL) 70 - 99 mg/dL  POC Glucose   Collection Time: 08/22/23  4:22 PM  Result Value Ref Range   Glucose, POC 105 (H) 70 - 99 mg/dL  POC Glucose   Collection Time: 08/22/23  5:39 PM  Result Value Ref Range   Glucose, POC 116 (H) 70 - 99 mg/dL  POC Glucose   Collection Time: 08/22/23  8:12 PM  Result Value Ref Range   Glucose, POC 113 (H) 70 - 99 mg/dL  POC Glucose   Collection Time: 08/23/23  12:08 AM  Result Value Ref Range   Glucose, POC 192 (H) 70 - 99 mg/dL  POC Glucose   Collection Time: 08/23/23  7:30 AM  Result Value Ref Range   Glucose, POC 236 (H) 70 - 99 mg/dL  POC Glucose   Collection Time: 08/23/23 11:35 AM  Result Value Ref Range   Glucose, POC 101 (H) 70 - 99 mg/dL  POC Glucose   Collection Time: 08/23/23  3:57 PM  Result Value Ref Range   Glucose, POC 234 (H) 70 - 99 mg/dL  POC Glucose   Collection Time: 08/23/23  8:01 PM  Result Value Ref Range   Glucose, POC 149 (H) 70 - 99 mg/dL  Renal Function Panel   Collection Time: 08/24/23  6:22 AM  Result Value Ref Range   Sodium 130 (L) 136 - 145 mmol/L   Potassium 4.9 (H) 3.4 - 4.5 mmol/L   Chloride 93 (L) 98 - 107 mmol/L   CO2 25 21 - 31 mmol/L   Anion Gap 12 6 - 14 mmol/L   Glucose, Random 237 (H) 70 - 99 mg/dL   Blood Urea Nitrogen (BUN) 58 (H) 7 - 25 mg/dL   Creatinine 4.06 (H) 9.39 - 1.20 mg/dL   eGFR 8 (L) >40 fO/fpw/8.26f7   Albumin 3.0 (L) 3.5 -  5.7 g/dL   Calcium  9.5 8.6 - 10.3 mg/dL   Phosphorus 5.8 (H) 2.5 - 5.0 mg/dL   BUN/Creatinine Ratio 9.8 (L) 10.0 - 20.0   Corrected Calcium  10.3 mg/dL  POC Glucose   Collection Time: 08/24/23  7:51 AM  Result Value Ref Range   Glucose, POC 241 (H) 70 - 99 mg/dL  CBC with Differential   Collection Time: 08/24/23 11:05 AM  Result Value Ref Range   WBC 6.16 4.40 - 11.00 10*3/uL   RBC 3.65 (L) 4.10 - 5.10 10*6/uL   Hemoglobin 10.5 (L) 12.3 - 15.3 g/dL   Hematocrit 65.8 (L) 64.0 - 44.6 %   Mean Corpuscular Volume (MCV) 93.6 80.0 - 96.0 fL   Mean Corpuscular Hemoglobin (MCH) 28.8 27.5 - 33.2 pg   Mean Corpuscular Hemoglobin Conc (MCHC) 30.8 (L) 33.0 - 37.0 g/dL   Red Cell Distribution Width (RDW) 19.9 (H) 12.3 - 17.0 %   Platelet Count (PLT) 144 (L) 150 - 450 10*3/uL   Mean Platelet Volume (MPV) 8.4 6.8 - 10.2 fL   Neutrophils % 67 %   Lymphocytes % 16 %   Monocytes % 13 %   Eosinophils % 1 %   Basophils % 3 %   Neutrophils Absolute 4.20 1.80 -  7.80 10*3/uL   Lymphocytes # 1.00 1.00 - 4.80 10*3/uL   Monocytes # 0.80 0.00 - 0.80 10*3/uL   Eosinophils # 0.10 0.00 - 0.50 10*3/uL   Basophils # 0.20 0.00 - 0.20 10*3/uL    Imaging:   Ultrasound abdomen 08/17/2023. IMPRESSION: 1. Diffuse gallbladder wall thickening with pericholecystic fluid and small gallstones. No sonographic Murphy sign. Findings are nonspecific and could be related to hepatic dysfunction, cholecystitis or fluid overload. 2. No biliary dilatation. 3. Trace ascites and small right pleural effusion.   MRI right knee 08/06/2023. IMPRESSION: 1. Large knee effusion with minimal synovitis. No findings of osteomyelitis. 2. Very small Baker's cyst. 3. Substantial degeneration of the posterior horn lateral meniscus near the meniscal root. Cannot readily excluded a degenerative tear. 4. 0.8 cm parameniscal cyst posterior to the posterior horn medial meniscus near the meniscal root. 5. Degeneration in the proximal PCL. 6. Mild proximal fibular collateral ligament thickening and accentuated signal which could represent a mild sprain or remote injury. 7. Elongated non-fragmented osteochondral lesion of the posterior patellar ridge superiorly with some overlying chondral irregularity. This is thought to be chronic and degenerative in nature. Otherwise moderate chondral thinning along the patellar facets. 8. Regional muscular atrophy.   MRI left knee 08/06/2023. IMPRESSION: 1. 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 2. Moderate knee effusion. No specific secondary signs such as subcortical marrow edema or synovitis to further indicate that this knee effusion is infected/septic, although this does not entirely rule out the possibility infection if there is a high clinical suspicion. 3. Likely degenerative tearing of the posterior horn of the lateral meniscus near the meniscal root. 4. Regional muscular atrophy.    X-ray right wrist 08/05/2023. 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 Prominent marginal and juxta-articular erosions contributing to destruction of the left first carpometacarpal joint, most pronounced at the base of the first metacarpal with suspected palmar and radial subluxation. Less pronounced erosive changes of the right scaphoid and base of the right first metacarpal. Moderate right first through fourth and left second and third [facility] joint space narrowing. Of note the recent right hand radiograph dated 07/26/2023 demonstrated terminal tuft resorption of  the fourth greater than third distal phalanges. 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 Clinical correlation and serologic workup are recommended     Chest x-ray 08/04/2023. IMPRESSION: Cardiomegaly.  No active disease.   ASSESSMENT/PLAN:  1) ESRD.     Repeat HD for 3.5 hours today.     Fluid removal to dry weight as BP tolerates.     Fluid restrict to < 1500 mls/day.     Continue TTS HD schedule.    2) Bilateral knee and right wrist pain.     S/p bilateral knee arthrocentesis.  Purulent fluid drained from both.     Pain resolved.     Negative cultures.     Continue IV antibiotics.     Orthopedics and ID follow-up appreciated.   3) Right fourth finger osteomyelitis.       Continue IV daptomycin and meropenem through 09/06/2023.     Monitor wbc/temp.     4) Type 2 DM.      Monitor FSG.      Insulin  coverage.     5) Hypotension.     Continue p.o. midodrine  15 mg 3 times daily     Monitor BP.     6) SVT.     Rate now controlled.     Continue p.o. amiodarone.     Eulas Manes MD, FACP 08/24/2023 1:07 PM

## 2023-08-24 NOTE — Care Plan (Signed)
 Problem: PAIN - ADULT Goal: Verbalizes/displays adequate comfort level or baseline comfort level Description: INTERVENTIONS: 1. Encourage pt to monitor pain and request assistance 2. Assess pain using appropriate pain scale 3. Administer analgesics based on type and severity of pain and evaluate response 4. Implement non-pharmacological measures as appropriate and evaluate response 5. Consider cultural and social influences on pain and pain management 6. Notify LIP if interventions unsuccessful or patient reports new pain Outcome: Adequate for Discharge   Problem: INFECTION - ADULT Goal: Absence of infection during hospitalization Description: INTERVENTIONS: 1. Assess and monitor for signs and symptoms of infection 2. Monitor lab/diagnostic results 3. Monitor all insertion sites i.e., indwelling lines, tubes and drains 4. Monitor endotracheal (as able) and nasal secretions for changes in amount and color 5. Institute appropriate cooling/warming therapies per order 6. Administer medications as ordered 7. Instruct and encourage patient and family to use good hand hygiene technique 8. Identify and instruct in appropriate isolation precautions for identified infection/condition Outcome: Adequate for Discharge Goal: Absence of fever/infection during anticipated neutropenic period Description: INTERVENTIONS 1. Monitor WBC 2. Administer growth factors as ordered 3. Implement neutropenic guidelines as ordered Outcome: Adequate for Discharge   Problem: Safety - Adult Goal: Free from fall injury Description: INTERVENTIONS: 1. Assess pt frequently for physical needs 2. Identify cognitive and physical deficits and behaviors that affect risk of falls. 3. Institute fall precautions as indicated by assessment. 4. Educate pt/family on patient safety including physical limitations 5. Instruct pt to call for assistance with activity based on assessment 6. Modify environment to reduce risk of  injury 7. Consider OT/PT consult to assist with strengthening/mobility Outcome: Adequate for Discharge Goal: Absence of infection during hospitalization Description: INTERVENTIONS: 1. Assess and monitor for signs and symptoms of infection 2. Monitor lab/diagnostic results 3. Monitor all insertion sites i.e., indwelling lines, tubes and drains 4. Monitor endotracheal (as able) and nasal secretions for changes in amount and color 5. Institute appropriate cooling/warming therapies per order 6. Administer medications as ordered 7. Instruct and encourage patient and family to use good hand hygiene technique 8. Identify and instruct in appropriate isolation precautions for identified infection/condition Outcome: Adequate for Discharge   Problem: DISCHARGE PLANNING Goal: Discharge to home or other facility with appropriate resources Description: INTERVENTIONS: 1. Identify barriers to discharge w/pt and caregiver 2. Arrange for needed discharge resources and transportation as appropriate 3. Identify discharge learning needs (meds, wound care, etc) 4. Arrange for interpreters to assist at discharge as needed 5. Refer to Case Management Department for coordinating discharge planning if the patient needs post-hospital services based on physician order or complex needs related to functional status, cognitive ability or social support system Outcome: Adequate for Discharge   Problem: Chronic Conditions and Co-Morbidities Goal: Patient's chronic conditions and co-morbidity symptoms are monitored and maintained or improved Description: INTERVENTIONS: 1. Monitor and assess patient's chronic conditions and comorbid symptoms for stability, deterioration, or improvement 2. Collaborate with multidisciplinary team to address chronic and comorbid conditions and prevent exacerbation or deterioration 3. Update acute care plan with appropriate goals if chronic or comorbid symptoms are exacerbated and prevent  overall improvement and discharge Outcome: Adequate for Discharge   Problem: Health Behavior: Goal: MCB Ability to state ways to decrease the risk of falls will be met by discharge Description: Ability to state ways to decrease the risk of falls will improve by discharge Outcome: Adequate for Discharge   Problem: Safety: Goal: Will remain free from falls by discharge Description: Will remain free from falls  by discharge Outcome: Adequate for Discharge   Problem: Health Behavior Goal: Ability to state ways to decrease risk of aspiration will improve Description: Ability to state ways to decrease risk of aspiration will improve Outcome: Adequate for Discharge Goal: Will demonstrate good oral care before and after meals/snacks/liquids Description: Will demonstrate good oral care before and after meals/snacks/liquids Outcome: Adequate for Discharge Goal: Ability to state ways to decrease the risk of falls will improve by discharge Description: Ability to state ways to decrease the risk of falls will improve Outcome: Adequate for Discharge Goal: Ability to state behaviors that decrease the risk for skin breakdown will improve Description: Ability to state behaviors that decrease the risk for skin breakdown will improve Outcome: Adequate for Discharge Goal: Knowledge of disease or condition will improve Description: Knowledge of disease or condition will improve Outcome: Adequate for Discharge   Problem: Nutritional: Goal: Ability to achieve adequate nutritional intake will improve Description: Ability to achieve adequate nutritional intake will improve Outcome: Adequate for Discharge Goal: Ability to tolerate tube feedings without aspirating will improve Description: Ability to tolerate tube feedings without aspirating will improve Outcome: Adequate for Discharge   Problem: Respiratory: Goal: Ability to remain free of respiratory infections will improve Description: Ability to  remain free of respiratory infections will improve Outcome: Adequate for Discharge Goal: Ability to maintain a clear airway will improve Description: Ability to maintain a clear airway will improve Outcome: Adequate for Discharge Goal: Ability to maintain adequate ventilation will improve in 24 hours Description: Ability to maintain adequate ventilation will improve by discharge Outcome: Adequate for Discharge   Problem: Safety: Goal: Patient will follow aspiration precautions independently Description: Patient will follow aspiration precautions independently Outcome: Adequate for Discharge Goal: Demonstration of proper swallowing techniques will improve Description: Demonstration of proper swallowing techniques will improve Outcome: Adequate for Discharge   Problem: Knowledge Deficit Goal: Patient/family/caregiver demonstrates understanding of disease process, treatment plan, medications, and discharge instructions Description: Complete learning assessment and assess knowledge base. Outcome: Adequate for Discharge   Problem: Compromised Skin Integrity Goal: Skin integrity is maintained or improved Description: Assess and monitor skin integrity. Identify patients at risk for skin breakdown on admission and per policy. Collaborate with interdisciplinary team and initiate plans and interventions as needed.    Outcome: Adequate for Discharge Goal: Fluid and electrolyte balance are achieved/maintained Description: Assess and monitor vital signs (orthostatic vitals if applicable), fluid intake and output, urine color, labs, skin turgor, mucous membranes, jugular venous distention, edema, circumference of edematous extremities and abdominal girth, respiratory status, and mental status.  Monitor for signs and symptoms of hypovolemia (tachycardia, rapid breathing, decreased urine output, postural hypotension, confusion, syncope).  Monitor for signs and symptoms of hypervolemia (strong rapid  pulse, shortness of breath, difficulty breathing lying down, crackles heard in lung fields, edema). Collaborate with interdisciplinary team and initiate plan and interventions as ordered. Outcome: Adequate for Discharge Goal: Nutritional status is improving Description: Monitor and assess patient for malnutrition (ex- brittle hair, bruises, dry skin, pale skin and conjunctiva, muscle wasting, smooth red tongue, and disorientation). Collaborate with interdisciplinary team and initiate plan and interventions as ordered.  Monitor patient's weight and dietary intake as ordered or per policy. Utilize nutrition screening tool and intervene per policy. Determine patient's food preferences and provide high-protein, high-caloric foods as appropriate.  Outcome: Adequate for Discharge   Problem: Urinary Incontinence Goal: Perineal skin integrity is maintained or improved Description: Assess genitourinary system, perineal skin, labs (urinalysis), and history of incontinence to include past  management, aggravating, and alleviating factors.  Collaborate with interdisciplinary team and initiate plans and interventions as needed. Outcome: Adequate for Discharge   Problem: Activity: Goal: Ability to ambulate will improve Description: Ability to ambulate will improve Outcome: Adequate for Discharge Goal: Ability to safely and independently change position in bed will improve Description: Ability to safely and independently change position in bed will improve Outcome: Adequate for Discharge Goal: Range of joint motion will improve Description: Range of joint motion will improve Outcome: Adequate for Discharge   Problem: Bowel/Gastric: Goal: Ability to achieve a regular elimination pattern will return to normal for the patient Description: Ability to achieve a regular elimination pattern will return to normal for the patient Outcome: Adequate for Discharge Goal: Establishment of normal bowel function will  improve by discharge Description: Establishment of normal bowel function will improve by discharge Outcome: Adequate for Discharge   Problem: Self-Care: Goal: Ability to perform activities of daily living will improve Description: Ability to perform activities of daily living will improve Outcome: Adequate for Discharge   Problem: Skin Integrity: Goal: Skin integrity will improve Description: Skin integrity will improve Outcome: Adequate for Discharge   Problem: Tissue Perfusion: Goal: Risk of venous thrombosis will decrease by discharge Description: Risk of venous thrombosis will decrease Outcome: Adequate for Discharge   Problem: Health Behavior: Goal: Knowledge of disease or condition will improve Description: Knowledge of disease or condition will improve Outcome: Adequate for Discharge   Problem: Fluid Volume: Goal: Will remain free of signs and symptoms of dehydration by discharge Description: Will remain free of signs and symptoms of dehydration by discharge Outcome: Adequate for Discharge   Problem: Medication: Goal: MCB Understanding of proper administration and use of medicines will improve Description: Understanding of proper administration and use of medicines will improve Outcome: Adequate for Discharge   Problem: Nutritional: Goal: Maintenance of adequate nutrition will improve Outcome: Adequate for Discharge   Problem: Skin Integrity: Goal: Ability to demonstrate warm and dry skin will improve by discharge Description: Ability to demonstrate warm and dry skin will improve by discharge Outcome: Adequate for Discharge

## 2023-08-24 NOTE — Progress Notes (Signed)
   08/24/23 1449  Vital Signs  Temp 98.3 F (36.8 C)  Temp Source Oral  Heart Rate 68  Resp 17  BP (!) 120/56  BP Location Left arm  BP Method Automatic  Patient Position Lying  SpO2 99 %  O2 Device Nasal cannula  O2 Flow Rate (L/min) 4 L/min  Post-Hemodialysis Assessment  End Time 1442  Rinseback Volume (mL) 300 mL  Actual Treatment Time (minutes) 210 minutes  Minutes Short From Prescription 0  EPO Given No  Blood Transfusion Given No  Weight 75.9 kg (167 lb 5.3 oz)  Weight Obtained By Bed scale  Patient Position for Weight Lying  Weight Change % -0.78  Hemodialysis Intake (mL) 300 mL  Hemodialysis Output (Volume Removed) mL 900 mL  Crit-Line Used No  Post-Hemodialysis Comments Pt hypotensive during tx. Goal decreased. 300ml fluids and albumin administered.  Duration of Treatment (minutes) 218 minutes  Patient Assessment  Best Eye Response Spontaneous  Best Verbal Response Oriented  Best Motor Response Follows commands  Glasgow Coma Scale Score 15  Bilateral Breath Sounds Clear  Heart Sounds S1, S2  Skin Color Appropriate for ethnicity  Skin Condition/Temp Warm;Dry  Edema Generalized  Hemodialysis Arteriovenous Fistula Upper arm  No placement date or time found.   Orientation: Right  Access Location: Upper arm  Status Deaccessed  Needle Size 15 G  Dressing Intervention New dressing

## 2023-08-24 NOTE — Nursing Note (Signed)
 This nurse call to give report to [facility]. The secretary  answered the phone and transferred me x2 and I was placed on hold for 6 mins the first time and 4 mins the second time before the phone was disconnected. I called back and left this extension [phone] as a call back when the nurse was ready to receive report.

## 2023-08-25 NOTE — Progress Notes (Signed)
 Case Management Discharge Note        CSN: [id number] DOB: Nov 16, 1965 Service: General Medicine Location: 620/01  Patient Class: Inpatient  DC Disposition: : Skilled Nursing Facility  Discharge DC Disposition: : Skilled Nursing Facility Placement Facility Type: Skilled Nursing Facility Skilled Nursing - Assisted Living Facility: [facility] Discharge Transport: Ambulance Discharge Transport Agency [place]: [facility]  [phone])  Discharge Referrals [place] geographical local area[place]ounty list shared with patient/family: Patient/family declined Post-Acute Provider Form Completed: Yes Case closed, patient/family agree with disposition plan: Yes       Case Management Coordination Status: Coordination Complete     Lauraine JINNY Lipoma, RN

## 2023-09-13 NOTE — Progress Notes (Signed)
 [facility] Nephrology Progress Note  Follow up for ESRD.  Hospital Day: 8  Subjective: Interval History: Laying down in bed and not in acute distress.  Hemodynamically stable.  Last dialysis 09/11/2023 with 2 L UF, postweight 76.6 kg. Significant IDH requiring midodrine .  On 2 L oxygen via nasal cannula.   Review of Systems: negative except as noted above  Objective:   Vital signs for last 24 hours: Temp:  [97.6 F (36.4 C)-98.7 F (37.1 C)] 98.4 F (36.9 C) Heart Rate:  [64-72] 70 Resp:  [18-19] 19 BP: (121-146)/(53-67) 141/53 Today's weight: 76.6 kg (168 lb 14 oz)  Intake/Output last 24 hours:  Intake/Output Summary (Last 24 hours) at 09/13/2023 1849 Last data filed at 09/13/2023 1200 Gross per 24 hour  Intake 720 ml  Output --  Net 720 ml    Medications: Scheduled Meds: calcium  carbonate-vitamin D3, 1 tablet, oral, Daily FLUoxetine , 20 mg, oral, Daily gabapentin , 300 mg, oral, Nightly heparin  (porcine), 5,000 Units, subcutaneous, Q8H SCH insulin  glargine, 3 Units, subcutaneous, QAM insulin  aspart (NovoLOG )/insulin  lispro (HumaLOG) injection, 0-6 Units, subcutaneous, With meals & at bedtime levothyroxine, 50 mcg, oral, Daily midodrine , 10 mg, oral, TID AC omeprazole, 20 mg, oral, Daily 0600 rOPINIRole , 1 mg, oral, Once per day on Sunday Monday Wednesday Friday rOPINIRole , 1 mg, oral, Once per day on Tuesday Thursday Saturday  And rOPINIRole , 1 mg, oral, Once per day on Tuesday Thursday Saturday sevelamer  carbonate, 1,600 mg, oral, TID with meals traZODone, 50 mg, oral, At Bedtime    PRN Meds: .  acetaminophen  .  alteplase  .  benzonatate .  cyclobenzaprine .  dextrose  .  dextrose  .  melatonin .  sodium chloride  .  zolpidem    Physical Exam: General appearance: alert, appears stated age, cooperative, and no distress Head: normocephalic, atraumatic Eyes: Sclera anicteric, no conjunctival pallor Lungs: Bilateral crackles present. Heart: s1s2 present,  regular rate and rhythm, systolic murmur bilateral upper sternal borders Abdomen: is flat, soft, no tenderness, bowel sounds present.   Extremities: Bilateral TMA, no significant lower extremity edema present Skin: Normal with no rash Neurologic: alert and oriented times 3 Access-Right upper extremity AV fistula.  Labs Recent Results (from the past 48 hours)  POC Glucose   Collection Time: 09/11/23  8:05 PM  Result Value Ref Range   Glucose, POC 110 (H) 70 - 99 mg/dL  Magnesium    Collection Time: 09/12/23  2:37 AM  Result Value Ref Range   Magnesium  2.0 1.9 - 2.7 mg/dL  Phosphorus   Collection Time: 09/12/23  2:37 AM  Result Value Ref Range   Phosphorus 2.9 2.5 - 5.0 mg/dL  Basic Metabolic Panel   Collection Time: 09/12/23  2:37 AM  Result Value Ref Range   Sodium 130 (L) 136 - 145 mmol/L   Potassium 3.8 3.4 - 4.5 mmol/L   Chloride 95 (L) 98 - 107 mmol/L   CO2 29 21 - 31 mmol/L   Anion Gap 6 6 - 14 mmol/L   Glucose, Random 93 70 - 99 mg/dL   Blood Urea Nitrogen (BUN) 17 7 - 25 mg/dL   Creatinine 7.43 (H) 9.39 - 1.20 mg/dL   eGFR 21 (L) >40 fO/fpw/8.26f7   Calcium  9.0 8.6 - 10.3 mg/dL   BUN/Creatinine Ratio 6.6 (L) 10.0 - 20.0  CBC with Differential   Collection Time: 09/12/23  2:37 AM  Result Value Ref Range   WBC 10.13 4.40 - 11.00 10*3/uL   RBC 2.97 (L) 4.10 - 5.10 10*6/uL   Hemoglobin 9.6 (L)  12.3 - 15.3 g/dL   Hematocrit 71.0 (L) 64.0 - 44.6 %   Mean Corpuscular Volume (MCV) 97.3 (H) 80.0 - 96.0 fL   Mean Corpuscular Hemoglobin (MCH) 32.2 27.5 - 33.2 pg   Mean Corpuscular Hemoglobin Conc (MCHC) 33.1 33.0 - 37.0 g/dL   Red Cell Distribution Width (RDW) 21.0 (H) 12.3 - 17.0 %   Platelet Count (PLT) 121 (L) 150 - 450 10*3/uL   Mean Platelet Volume (MPV) 8.0 6.8 - 10.2 fL   Neutrophils % 75 %   Lymphocytes % 14 %   Monocytes % 9 %   Eosinophils % 1 %   Basophils % 1 %   Neutrophils Absolute 7.60 1.80 - 7.80 10*3/uL   Lymphocytes # 1.40 1.00 - 4.80 10*3/uL    Monocytes # 0.90 (H) 0.00 - 0.80 10*3/uL   Eosinophils # 0.10 0.00 - 0.50 10*3/uL   Basophils # 0.10 0.00 - 0.20 10*3/uL  POC Glucose   Collection Time: 09/12/23  6:57 AM  Result Value Ref Range   Glucose, POC 143 (H) 70 - 99 mg/dL  POC Glucose   Collection Time: 09/12/23  2:17 PM  Result Value Ref Range   Glucose, POC 116 (H) 70 - 99 mg/dL  POC Glucose   Collection Time: 09/12/23  6:25 PM  Result Value Ref Range   Glucose, POC 92 70 - 99 mg/dL  POC Glucose   Collection Time: 09/12/23  8:26 PM  Result Value Ref Range   Glucose, POC 81 70 - 99 mg/dL  Magnesium    Collection Time: 09/13/23  2:01 AM  Result Value Ref Range   Magnesium  2.2 1.9 - 2.7 mg/dL  Phosphorus   Collection Time: 09/13/23  2:01 AM  Result Value Ref Range   Phosphorus 3.7 2.5 - 5.0 mg/dL  Basic Metabolic Panel   Collection Time: 09/13/23  2:01 AM  Result Value Ref Range   Sodium 127 (L) 136 - 145 mmol/L   Potassium 3.7 3.4 - 4.5 mmol/L   Chloride 93 (L) 98 - 107 mmol/L   CO2 27 21 - 31 mmol/L   Anion Gap 7 6 - 14 mmol/L   Glucose, Random 60 (L) 70 - 99 mg/dL   Blood Urea Nitrogen (BUN) 23 7 - 25 mg/dL   Creatinine 6.58 (H) 9.39 - 1.20 mg/dL   eGFR 15 (L) >40 fO/fpw/8.26f7   Calcium  9.4 8.6 - 10.3 mg/dL   BUN/Creatinine Ratio 6.7 (L) 10.0 - 20.0  CBC with Differential   Collection Time: 09/13/23  2:01 AM  Result Value Ref Range   WBC 9.36 4.40 - 11.00 10*3/uL   RBC 3.03 (L) 4.10 - 5.10 10*6/uL   Hemoglobin 9.7 (L) 12.3 - 15.3 g/dL   Hematocrit 70.3 (L) 64.0 - 44.6 %   Mean Corpuscular Volume (MCV) 97.5 (H) 80.0 - 96.0 fL   Mean Corpuscular Hemoglobin (MCH) 32.1 27.5 - 33.2 pg   Mean Corpuscular Hemoglobin Conc (MCHC) 32.9 (L) 33.0 - 37.0 g/dL   Red Cell Distribution Width (RDW) 20.3 (H) 12.3 - 17.0 %   Platelet Count (PLT) 125 (L) 150 - 450 10*3/uL   Mean Platelet Volume (MPV) 8.0 6.8 - 10.2 fL   Neutrophils % 70 %   Lymphocytes % 18 %   Monocytes % 9 %   Eosinophils % 2 %   Basophils % 1 %    Neutrophils Absolute 6.50 1.80 - 7.80 10*3/uL   Lymphocytes # 1.70 1.00 - 4.80 10*3/uL   Monocytes # 0.90 (H)  0.00 - 0.80 10*3/uL   Eosinophils # 0.20 0.00 - 0.50 10*3/uL   Basophils # 0.10 0.00 - 0.20 10*3/uL  POC Glucose   Collection Time: 09/13/23  7:42 AM  Result Value Ref Range   Glucose, POC 93 70 - 99 mg/dL  POC Glucose   Collection Time: 09/13/23 11:49 AM  Result Value Ref Range   Glucose, POC 97 70 - 99 mg/dL  POC Glucose   Collection Time: 09/13/23  4:06 PM  Result Value Ref Range   Glucose, POC 98 70 - 99 mg/dL    Radiology Results (last 14 days)     Procedure Component Value Units Date/Time   IR Tunneled Cath Removal [[id number]] Collected: 09/13/23 1006   Order Status: Completed Updated: 09/13/23 1049   Narrative:     INDICATION: 58 year old female status post tunneled CVC placement on 08/23/2023 for antibiotic. Patient finished antibiotic treatment.  EXAM: REMOVAL OF TUNNELED CENTRAL VENOUS CATHETER  MEDICATIONS: None  COMPLICATIONS: None immediate.  PROCEDURE: Informed verbal consent was obtained from the patient following an explanation of the procedure, risks, benefits and alternatives to treatment. A time out was performed prior to the initiation of the procedure.  Sterile technique was utilized including mask, sterile gloves, sterile drape, and hand hygiene.  ChloraPrep was used to prep the patient's right neck, chest and existing catheter.  The catheter was removed intact by applying manual retraction. Hemostasis was obtained with manual compression. A dressing was placed. The patient tolerated the procedure well without immediate post procedural complication.    Impression:     Successful removal of tunneled central venous catheter.  Performed by: Aimee Han, PA-C   Electronically Signed   By: Cordella Banner   On: 09/13/2023 10:35    US  Chest [[id number]] Collected: 09/09/23 1550   Order Status: Completed Updated: 09/09/23 1720    Narrative:     CLINICAL DATA:  Limited ultrasound of the right chest done prior to possible thoracentesis.  EXAM: CHEST ULTRASOUND  COMPARISON:  CT scan of the chest performed September 08, 2023  FINDINGS: There is only a small right pleural effusion present with no window to allow for safe approach for thoracentesis.    Impression:     Small right pleural effusion. No window to allow for safe approach for thoracentesis.  Procedure performed by: Sari Lamp, PA-C   Electronically Signed   By: Juliene Balder M.D.   On: 09/09/2023 17:18    US  Thoracentesis W Imaging Left [[id number]] Collected: 09/09/23 1544   Order Status: Completed Updated: 09/09/23 1703   Narrative:     INDICATION: Renal failure on hemodialysis with fluid overload and bilateral pleural effusions. Request for therapeutic thoracentesis.  EXAM: ULTRASOUND GUIDED LEFT THORACENTESIS  MEDICATIONS: 1% lidocaine  8 mL  COMPLICATIONS: None immediate.  PROCEDURE: An ultrasound guided thoracentesis was thoroughly discussed with the patient and questions answered. The benefits, risks, alternatives and complications were also discussed. The patient understands and wishes to proceed with the procedure. Written consent was obtained.  Ultrasound was performed to localize and mark an adequate pocket of fluid in the left chest. The area was then prepped and draped in the normal sterile fashion. 1% Lidocaine  was used for local anesthesia. Under ultrasound guidance a 6 Fr Safe-T-Centesis catheter was introduced. Thoracentesis was performed. The catheter was removed and a dressing applied.  FINDINGS: A total of approximately 500 mL of clear yellow fluid was removed.    Impression:     Successful ultrasound guided left thoracentesis yielding  500 mL of pleural fluid.  No pneumothorax on post-procedure chest x-ray.  Procedure performed by: Sari Lamp, PA-C   Electronically Signed   By: Juliene Balder M.D.   On:  09/09/2023 17:00    XR Chest 1 View [[id number]] Collected: 09/09/23 1555   Order Status: Completed Updated: 09/09/23 1602   Narrative:     CLINICAL DATA:  post-thora.  EXAM: CHEST  1 VIEW  COMPARISON:  09/05/2023.  FINDINGS: Low lung volume. There is moderate diffuse pulmonary vascular congestion, progressed since the prior study. There are heterogeneous opacities overlying the right lower lung, which are new since the prior study and may represent underlying atelectasis. Correlate clinically for superimposed pneumonia.  There is at least small right pleural effusion, increased since the prior chest radiograph from 09/05/2023. Comparison with prior chest CT scan from 09/08/2023 is difficult due to differences in technique and layering effusions. There is also small left pleural effusion, which also appears increased since the prior chest radiograph. No pneumothorax on either side.  Stable mildly enlarged cardio-mediastinal silhouette.  No acute osseous abnormalities.  The soft tissues are within normal limits.    Impression:     1. Findings favor interval worsening of congestive heart failure/pulmonary edema. 2. There are heterogeneous opacities overlying the right lower lung, which are new since the prior study and may represent underlying atelectasis. Correlate clinically for superimposed pneumonia. 3. There is at least small right pleural effusion, increased since the prior chest radiograph from 09/05/2023. There is also small left pleural effusion, which also appears increased since the prior chest radiograph.   Electronically Signed   By: Ree Molt M.D.   On: 09/09/2023 15:59    CT Chest WO Contrast [[id number]] Collected: 09/08/23 1712   Order Status: Completed Updated: 09/08/23 1729   Narrative:     CLINICAL DATA:  Shortness of breath and hypoxia.  EXAM: CT CHEST WITHOUT CONTRAST  TECHNIQUE: Multidetector CT imaging of the chest was performed  following the standard protocol without IV contrast.  RADIATION DOSE REDUCTION: This exam was performed according to the departmental dose-optimization program which includes automated exposure control, adjustment of the mA and/or kV according to patient size and/or use of iterative reconstruction technique.  COMPARISON:  Chest CTA dated 09/05/2023.  FINDINGS: Cardiovascular: Stable enlarged heart and dense mitral valve annulus calcifications. Atheromatous calcifications, including the coronary arteries and aorta. These are dense and confluence over long segments of the coronary arteries. Small pericardial effusion without significant change.  Mediastinum/Nodes: Medium to high density ingested material in the stomach and distal esophagus with mild dilatation of the distal esophagus. Mild-to-moderate diffuse esophageal wall thickening more proximally in the distal esophagus. Normal-appearing trachea. No enlarged lymph nodes. Unremarkable thyroid  gland.  Lungs/Pleura: Moderate-sized bilateral pleural effusions with mild improvement. Bilateral lower lobe atelectasis. Mild decrease in density and increase in extent of ground-glass opacities in the left upper lobe.  Upper Abdomen: Small amount of ascites without significant change. Diffuse subcutaneous edema. Extensive, dense atheromatous arterial calcifications. Bilateral renal atrophy. Vicariously excreted contrast in the gallbladder as well as 5 mm gallstone in the gallbladder. Mild diffuse gallbladder wall thickening with less pericholecystic fluid than previously.  Musculoskeletal: Mild thoracic spine degenerative changes.    Impression:     1. Mild improvement in moderate-sized bilateral pleural effusions and bilateral lower lobe atelectasis. 2. Mild decrease in density and increase in extent of ground-glass opacities in the left upper lobe. This has an appearance most compatible with pneumonitis. 3. Stable cardiomegaly  and small pericardial effusion. 4. Stable small amount of ascites. 5. Stable diffuse subcutaneous edema. 6. Medium to high density ingested material in the stomach and distal esophagus with mild dilatation of the distal esophagus and mild-to-moderate diffuse distal esophageal wall thickening above the level of dilatation. These changes are most compatible with gastroesophageal reflux and associated reflux esophagitis. 7. Cholelithiasis with mild diffuse gallbladder wall thickening and less pericholecystic fluid than previously. The wall thickening is most likely related to the patient's anasarca rather than acute cholecystitis. 8. Extensive, dense atheromatous arterial calcifications, including the coronary arteries and aorta. 9. Bilateral renal atrophy.  Aortic Atherosclerosis (ICD10-I70.0).   Electronically Signed   By: Elspeth Bathe M.D.   On: 09/08/2023 17:27    MRI Brain WO Contrast [[id number]] Collected: 09/07/23 0336   Order Status: Completed Updated: 09/07/23 0348   Narrative:     CLINICAL DATA:  Initial evaluation for acute dizziness.  EXAM: MRI HEAD WITHOUT CONTRAST  TECHNIQUE: Multiplanar, multiecho pulse sequences of the brain and surrounding structures were obtained without intravenous contrast.  COMPARISON:  Prior CT from 08/28/2023  FINDINGS: Brain: Cerebral volume within normal limits. Mild hazy T2/FLAIR hyperintensity involving the periventricular white matter and pons, most characteristic of chronic microvascular ischemic disease.  No abnormal foci of restricted diffusion to suggest acute or subacute ischemia. No areas of chronic cortical infarction. No acute intracranial hemorrhage. Prominent chronic microhemorrhage noted within the left temporal lobe (series 12, image 37). Additional punctate chronic microhemorrhage noted within the right cerebellum (series 12, image 22).  No mass lesion, midline shift or mass effect noted no hydrocephalus or  extra-axial fluid collection. Pituitary gland within normal limits.  Vascular: Right vertebral artery hypoplastic and not well seen. Major intracranial vascular flow voids are otherwise well maintained.  Skull and upper cervical spine: Craniocervical junction within normal limits. Heterogeneous and decreased T1 signal intensity noted within the visualized bone marrow, nonspecific, but most commonly related to anemia, smoking or obesity. No scalp soft tissue abnormality.  Sinuses/Orbits: Prior bilateral ocular lens replacement. Sequelae of chronic sphenoid sinusitis noted. Paranasal sinuses are otherwise largely clear. Trace bilateral mastoid effusions noted, of doubtful significance. Image  Other: None.    Impression:     1. No acute intracranial abnormality. 2. Mild chronic microvascular ischemic disease for age.   Electronically Signed   By: Morene Hoard M.D.   On: 09/07/2023 03:45    CT Angio Chest Pulmonary Embolism [[id number]] Collected: 09/05/23 1709   Order Status: Completed Updated: 09/05/23 1723   Narrative:     CLINICAL DATA:  Shortness of breath. Recent hospitalization due to sepsis and osteomyelitis. Lightheadedness/dizziness, ringing in the ears, and shortness of breath with low at O2 sat.  EXAM: CT ANGIOGRAPHY CHEST WITH CONTRAST  TECHNIQUE: Multidetector CT imaging of the chest was performed using the standard protocol during bolus administration of intravenous contrast. Multiplanar CT image reconstructions and MIPs were obtained to evaluate the vascular anatomy.  RADIATION DOSE REDUCTION: This exam was performed according to the departmental dose-optimization program which includes automated exposure control, adjustment of the mA and/or kV according to patient size and/or use of iterative reconstruction technique.  CONTRAST:  80 mL iohexoL  (OMNIPAQUE ) 350 mg iodine/mL injection (MDV) 80 mL  COMPARISON:   06/10/2008.  FINDINGS: Cardiovascular: The heart is enlarged and there is a small pericardial effusion. Multi-vessel coronary artery calcifications are noted. A right internal jugular central venous catheter is noted in the superior vena cava. There is atherosclerotic calcification of the aorta without  evidence aneurysm. The pulmonary trunk is mildly distended suggesting underlying pulmonary artery hypertension. No evidence of pulmonary embolism is seen.  Mediastinum/Nodes: No mediastinal, hilar, or axillary lymphadenopathy. The thyroid  gland, trachea, and esophagus are within normal limits.  Lungs/Pleura: There are moderate bilateral pleural effusions, greater on the right than on the left. Focal airspace disease is present in the left upper lobe and to a smaller extent in the right upper lobe. There is consolidation in the lower lobes bilaterally. No pneumothorax is seen.  Upper Abdomen: Hyperdense material in a stone present within the gallbladder. There is bilateral renal atrophy. Mild-to-moderate ascites is present in the upper abdomen.  Musculoskeletal: There is diffuse anasarca. Degenerative changes are present in the thoracic spine. No acute osseous abnormality is seen  Review of the MIP images confirms the above findings.    Impression:     1. No evidence of pulmonary embolism. 2. Moderate bilateral pleural effusions. 3. Ground-glass opacities in the upper lobes with consolidation at the lung bases, possible edema or infiltrate. 4. Anasarca and mild-to-moderate ascites. 5. Aortic atherosclerosis and coronary artery calcifications.   Electronically Signed   By: Leita Birmingham M.D.   On: 09/05/2023 17:21    XR Chest 1 View [[id number]] Collected: 09/05/23 1609   Order Status: Completed Updated: 09/05/23 1613   Narrative:     CLINICAL DATA:  Shortness of breath.  EXAM: CHEST  1 VIEW  COMPARISON:  08/28/2023.  FINDINGS: Trachea is midline. Heart is enlarged,  stable. Thoracic aorta is calcified. Right IJ central line tip is in the right atrium. Nodular airspace opacification in the apical left upper lobe. Left lower lobe collapse/consolidation. Hazy opacification in the right lower lobe. Small bilateral pleural effusions.    Impression:     1. Nodular and patchy airspace opacification in the lungs bilaterally, increased slightly from 08/28/2023, indicative of pneumonia. Followup PA and lateral chest X-ray is recommended in 3-4 weeks following trial of antibiotic therapy to ensure resolution and exclude underlying malignancy. 2. Small bilateral pleural effusions.   Electronically Signed   By: Newell Eke M.D.   On: 09/05/2023 16:11            Assessment/Plan: 1)ESRD, diabetes-Continue on HD TTS schedule. Most recent [post weight 76.6kg.Discussed importance of interdialytic fluid restriction given baseline serum sodium. 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 3)Anemia of ESRD-H&H stable and at goal.  ESA per outpatient protocol. 4)HTN-blood pressure low normal and pressor dependence, maintain MAP> 60.  Continue on midodrine . 5)Bilateral knee and right wrist pain-bilateral knee arthrocentesis with purulent fluid removed 08/06/23.  6)Type 2 diabetes-blood glucose improved, continue insulin  based regimen. 7) Right fourth finger Completed Dapto/Mero 09/06/23. 8) SVT-continue on oral amiodarone.   Howell King MD, FASN 09/13/2023 6:49 PM

## 2023-09-14 ENCOUNTER — Ambulatory Visit: Admitting: Cardiology

## 2023-09-14 NOTE — Procedures (Signed)
 [facility] Dialysis Progress Note    Procedure Date:  09/14/23         I was present and supervised the dialysis procedure for Patient Bryonna Sundby Caporaso   Interval History: Tolerating procedure well with no complications. On 2 L oxygen by nasal cannula.  Dialysis prescription Access Type: R UE AVF  Dialyzer: Optiflux D160  Dialysate: 3 K+, 2.5 Ca++  Dialysate flow: 300 mL/min  Blood flow up to: 400 mL/min  Ultrafiltration goal: up to 2 L as tolerated by blood pressure.  Time: 3.5 hours.    Access site:     Catheter fill volumes:   Arterial: 1.77mL Venous:   1.7 mL  Catheter filled with n/a post procedure.  Vitals: Temp:  [97.4 F (36.3 C)-98.4 F (36.9 C)] 97.4 F (36.3 C) Heart Rate:  [63-71] 65 Resp:  [15-19] 17 BP: (102-143)/(52-68) 143/68   Physical Exam: General appearance: alert, appears stated age, cooperative, and no distress Head: normocephalic, atraumatic Eyes: Sclera anicteric, no conjunctival pallor Lungs: Basilar crackles present.L >R  Heart: s1s2 present,regular rate and rhythm, no murmurs Abdomen:flat,soft, no tenderness,bs present Extremities: Bilateral TMA, trace bipedal edema present. Skin: Skin color, texture, turgor normal. No rashes or lesions Neurologic: awake, alert and oriented times 3   Medications: Scheduled Meds: calcium  carbonate-vitamin D3, 1 tablet, oral, Daily FLUoxetine , 20 mg, oral, Daily gabapentin , 300 mg, oral, Nightly heparin  (porcine), 5,000 Units, subcutaneous, Q8H [facility] insulin  glargine, 3 Units, subcutaneous, QAM insulin  aspart (NovoLOG )/insulin  lispro (HumaLOG) injection, 0-6 Units, subcutaneous, With meals & at bedtime levothyroxine, 50 mcg, oral, Daily midodrine , 10 mg, oral, TID AC omeprazole, 20 mg, oral, Daily 0600 rOPINIRole , 1 mg, oral, Once per day on Sunday Monday Wednesday Friday rOPINIRole , 1 mg, oral, Once per day on Tuesday Thursday Saturday  And rOPINIRole , 1 mg, oral, Once per day on Tuesday Thursday  Saturday sevelamer  carbonate, 1,600 mg, oral, TID with meals traZODone, 50 mg, oral, At Bedtime    PRN Meds: .  acetaminophen  .  alteplase  .  benzonatate .  cyclobenzaprine .  dextrose  .  dextrose  .  melatonin .  sodium chloride  .  zolpidem    Labs Recent Results (from the past 48 hours)  POC Glucose   Collection Time: 09/12/23  2:17 PM  Result Value Ref Range   Glucose, POC 116 (H) 70 - 99 mg/dL  POC Glucose   Collection Time: 09/12/23  6:25 PM  Result Value Ref Range   Glucose, POC 92 70 - 99 mg/dL  POC Glucose   Collection Time: 09/12/23  8:26 PM  Result Value Ref Range   Glucose, POC 81 70 - 99 mg/dL  Magnesium    Collection Time: 09/13/23  2:01 AM  Result Value Ref Range   Magnesium  2.2 1.9 - 2.7 mg/dL  Phosphorus   Collection Time: 09/13/23  2:01 AM  Result Value Ref Range   Phosphorus 3.7 2.5 - 5.0 mg/dL  Basic Metabolic Panel   Collection Time: 09/13/23  2:01 AM  Result Value Ref Range   Sodium 127 (L) 136 - 145 mmol/L   Potassium 3.7 3.4 - 4.5 mmol/L   Chloride 93 (L) 98 - 107 mmol/L   CO2 27 21 - 31 mmol/L   Anion Gap 7 6 - 14 mmol/L   Glucose, Random 60 (L) 70 - 99 mg/dL   Blood Urea Nitrogen (BUN) 23 7 - 25 mg/dL   Creatinine 6.58 (H) 9.39 - 1.20 mg/dL   eGFR 15 (L) >40 fO/fpw/8.26f7   Calcium  9.4 8.6 -  10.3 mg/dL   BUN/Creatinine Ratio 6.7 (L) 10.0 - 20.0  CBC with Differential   Collection Time: 09/13/23  2:01 AM  Result Value Ref Range   WBC 9.36 4.40 - 11.00 10*3/uL   RBC 3.03 (L) 4.10 - 5.10 10*6/uL   Hemoglobin 9.7 (L) 12.3 - 15.3 g/dL   Hematocrit 70.3 (L) 64.0 - 44.6 %   Mean Corpuscular Volume (MCV) 97.5 (H) 80.0 - 96.0 fL   Mean Corpuscular Hemoglobin (MCH) 32.1 27.5 - 33.2 pg   Mean Corpuscular Hemoglobin Conc (MCHC) 32.9 (L) 33.0 - 37.0 g/dL   Red Cell Distribution Width (RDW) 20.3 (H) 12.3 - 17.0 %   Platelet Count (PLT) 125 (L) 150 - 450 10*3/uL   Mean Platelet Volume (MPV) 8.0 6.8 - 10.2 fL   Neutrophils % 70 %   Lymphocytes  % 18 %   Monocytes % 9 %   Eosinophils % 2 %   Basophils % 1 %   Neutrophils Absolute 6.50 1.80 - 7.80 10*3/uL   Lymphocytes # 1.70 1.00 - 4.80 10*3/uL   Monocytes # 0.90 (H) 0.00 - 0.80 10*3/uL   Eosinophils # 0.20 0.00 - 0.50 10*3/uL   Basophils # 0.10 0.00 - 0.20 10*3/uL  POC Glucose   Collection Time: 09/13/23  7:42 AM  Result Value Ref Range   Glucose, POC 93 70 - 99 mg/dL  POC Glucose   Collection Time: 09/13/23 11:49 AM  Result Value Ref Range   Glucose, POC 97 70 - 99 mg/dL  POC Glucose   Collection Time: 09/13/23  4:06 PM  Result Value Ref Range   Glucose, POC 98 70 - 99 mg/dL  POC Glucose   Collection Time: 09/13/23  7:43 PM  Result Value Ref Range   Glucose, POC 89 70 - 99 mg/dL  Magnesium    Collection Time: 09/14/23  5:41 AM  Result Value Ref Range   Magnesium  2.2 1.9 - 2.7 mg/dL  Phosphorus   Collection Time: 09/14/23  5:41 AM  Result Value Ref Range   Phosphorus 3.9 2.5 - 5.0 mg/dL  Basic Metabolic Panel   Collection Time: 09/14/23  5:41 AM  Result Value Ref Range   Sodium 127 (L) 136 - 145 mmol/L   Potassium 4.0 3.4 - 4.5 mmol/L   Chloride 91 (L) 98 - 107 mmol/L   CO2 26 21 - 31 mmol/L   Anion Gap 10 6 - 14 mmol/L   Glucose, Random 76 70 - 99 mg/dL   Blood Urea Nitrogen (BUN) 29 (H) 7 - 25 mg/dL   Creatinine 5.80 (H) 9.39 - 1.20 mg/dL   eGFR 12 (L) >40 fO/fpw/8.26f7   Calcium  9.4 8.6 - 10.3 mg/dL   BUN/Creatinine Ratio 6.9 (L) 10.0 - 20.0  CBC with Differential   Collection Time: 09/14/23  5:41 AM  Result Value Ref Range   WBC 7.36 4.40 - 11.00 10*3/uL   RBC 3.23 (L) 4.10 - 5.10 10*6/uL   Hemoglobin 10.2 (L) 12.3 - 15.3 g/dL   Hematocrit 68.6 (L) 64.0 - 44.6 %   Mean Corpuscular Volume (MCV) 97.1 (H) 80.0 - 96.0 fL   Mean Corpuscular Hemoglobin (MCH) 31.5 27.5 - 33.2 pg   Mean Corpuscular Hemoglobin Conc (MCHC) 32.4 (L) 33.0 - 37.0 g/dL   Red Cell Distribution Width (RDW) 19.7 (H) 12.3 - 17.0 %   Platelet Count (PLT) 134 (L) 150 - 450 10*3/uL    Mean Platelet Volume (MPV) 7.9 6.8 - 10.2 fL   Neutrophils % 67 %  Lymphocytes % 20 %   Monocytes % 10 %   Eosinophils % 2 %   Basophils % 1 %   Neutrophils Absolute 4.90 1.80 - 7.80 10*3/uL   Lymphocytes # 1.50 1.00 - 4.80 10*3/uL   Monocytes # 0.70 0.00 - 0.80 10*3/uL   Eosinophils # 0.10 0.00 - 0.50 10*3/uL   Basophils # 0.10 0.00 - 0.20 10*3/uL  POC Glucose   Collection Time: 09/14/23  6:54 AM  Result Value Ref Range   Glucose, POC 84 70 - 99 mg/dL    Radiology Results (last 14 days)     Procedure Component Value Units Date/Time   IR Tunneled Cath Removal [[id number]] Collected: 09/13/23 1006   Order Status: Completed Updated: 09/13/23 1049   Narrative:     INDICATION: 58 year old female status post tunneled CVC placement on 08/23/2023 for antibiotic. Patient finished antibiotic treatment.  EXAM: REMOVAL OF TUNNELED CENTRAL VENOUS CATHETER  MEDICATIONS: None  COMPLICATIONS: None immediate.  PROCEDURE: Informed verbal consent was obtained from the patient following an explanation of the procedure, risks, benefits and alternatives to treatment. A time out was performed prior to the initiation of the procedure.  Sterile technique was utilized including mask, sterile gloves, sterile drape, and hand hygiene.  ChloraPrep was used to prep the patient's right neck, chest and existing catheter.  The catheter was removed intact by applying manual retraction. Hemostasis was obtained with manual compression. A dressing was placed. The patient tolerated the procedure well without immediate post procedural complication.    Impression:     Successful removal of tunneled central venous catheter.  Performed by: Aimee Han, PA-C   Electronically Signed   By: Cordella Banner   On: 09/13/2023 10:35    US  Chest [[id number]] Collected: 09/09/23 1550   Order Status: Completed Updated: 09/09/23 1720   Narrative:     CLINICAL DATA:  Limited ultrasound of the right  chest done prior to possible thoracentesis.  EXAM: CHEST ULTRASOUND  COMPARISON:  CT scan of the chest performed September 08, 2023  FINDINGS: There is only a small right pleural effusion present with no window to allow for safe approach for thoracentesis.    Impression:     Small right pleural effusion. No window to allow for safe approach for thoracentesis.  Procedure performed by: Sari Lamp, PA-C   Electronically Signed   By: Juliene Balder M.D.   On: 09/09/2023 17:18    US  Thoracentesis W Imaging Left [[id number]] Collected: 09/09/23 1544   Order Status: Completed Updated: 09/09/23 1703   Narrative:     INDICATION: Renal failure on hemodialysis with fluid overload and bilateral pleural effusions. Request for therapeutic thoracentesis.  EXAM: ULTRASOUND GUIDED LEFT THORACENTESIS  MEDICATIONS: 1% lidocaine  8 mL  COMPLICATIONS: None immediate.  PROCEDURE: An ultrasound guided thoracentesis was thoroughly discussed with the patient and questions answered. The benefits, risks, alternatives and complications were also discussed. The patient understands and wishes to proceed with the procedure. Written consent was obtained.  Ultrasound was performed to localize and mark an adequate pocket of fluid in the left chest. The area was then prepped and draped in the normal sterile fashion. 1% Lidocaine  was used for local anesthesia. Under ultrasound guidance a 6 Fr Safe-T-Centesis catheter was introduced. Thoracentesis was performed. The catheter was removed and a dressing applied.  FINDINGS: A total of approximately 500 mL of clear yellow fluid was removed.    Impression:     Successful ultrasound guided left thoracentesis yielding 500 mL of pleural  fluid.  No pneumothorax on post-procedure chest x-ray.  Procedure performed by: Sari Lamp, PA-C   Electronically Signed   By: Juliene Balder M.D.   On: 09/09/2023 17:00    XR Chest 1 View [[id number]] Collected:  09/09/23 1555   Order Status: Completed Updated: 09/09/23 1602   Narrative:     CLINICAL DATA:  post-thora.  EXAM: CHEST  1 VIEW  COMPARISON:  09/05/2023.  FINDINGS: Low lung volume. There is moderate diffuse pulmonary vascular congestion, progressed since the prior study. There are heterogeneous opacities overlying the right lower lung, which are new since the prior study and may represent underlying atelectasis. Correlate clinically for superimposed pneumonia.  There is at least small right pleural effusion, increased since the prior chest radiograph from 09/05/2023. Comparison with prior chest CT scan from 09/08/2023 is difficult due to differences in technique and layering effusions. There is also small left pleural effusion, which also appears increased since the prior chest radiograph. No pneumothorax on either side.  Stable mildly enlarged cardio-mediastinal silhouette.  No acute osseous abnormalities.  The soft tissues are within normal limits.    Impression:     1. Findings favor interval worsening of congestive heart failure/pulmonary edema. 2. There are heterogeneous opacities overlying the right lower lung, which are new since the prior study and may represent underlying atelectasis. Correlate clinically for superimposed pneumonia. 3. There is at least small right pleural effusion, increased since the prior chest radiograph from 09/05/2023. There is also small left pleural effusion, which also appears increased since the prior chest radiograph.   Electronically Signed   By: Ree Molt M.D.   On: 09/09/2023 15:59    CT Chest WO Contrast [[id number]] Collected: 09/08/23 1712   Order Status: Completed Updated: 09/08/23 1729   Narrative:     CLINICAL DATA:  Shortness of breath and hypoxia.  EXAM: CT CHEST WITHOUT CONTRAST  TECHNIQUE: Multidetector CT imaging of the chest was performed following the standard protocol without IV contrast.  RADIATION  DOSE REDUCTION: This exam was performed according to the departmental dose-optimization program which includes automated exposure control, adjustment of the mA and/or kV according to patient size and/or use of iterative reconstruction technique.  COMPARISON:  Chest CTA dated 09/05/2023.  FINDINGS: Cardiovascular: Stable enlarged heart and dense mitral valve annulus calcifications. Atheromatous calcifications, including the coronary arteries and aorta. These are dense and confluence over long segments of the coronary arteries. Small pericardial effusion without significant change.  Mediastinum/Nodes: Medium to high density ingested material in the stomach and distal esophagus with mild dilatation of the distal esophagus. Mild-to-moderate diffuse esophageal wall thickening more proximally in the distal esophagus. Normal-appearing trachea. No enlarged lymph nodes. Unremarkable thyroid  gland.  Lungs/Pleura: Moderate-sized bilateral pleural effusions with mild improvement. Bilateral lower lobe atelectasis. Mild decrease in density and increase in extent of ground-glass opacities in the left upper lobe.  Upper Abdomen: Small amount of ascites without significant change. Diffuse subcutaneous edema. Extensive, dense atheromatous arterial calcifications. Bilateral renal atrophy. Vicariously excreted contrast in the gallbladder as well as 5 mm gallstone in the gallbladder. Mild diffuse gallbladder wall thickening with less pericholecystic fluid than previously.  Musculoskeletal: Mild thoracic spine degenerative changes.    Impression:     1. Mild improvement in moderate-sized bilateral pleural effusions and bilateral lower lobe atelectasis. 2. Mild decrease in density and increase in extent of ground-glass opacities in the left upper lobe. This has an appearance most compatible with pneumonitis. 3. Stable cardiomegaly and small pericardial effusion. 4.  Stable small amount of  ascites. 5. Stable diffuse subcutaneous edema. 6. Medium to high density ingested material in the stomach and distal esophagus with mild dilatation of the distal esophagus and mild-to-moderate diffuse distal esophageal wall thickening above the level of dilatation. These changes are most compatible with gastroesophageal reflux and associated reflux esophagitis. 7. Cholelithiasis with mild diffuse gallbladder wall thickening and less pericholecystic fluid than previously. The wall thickening is most likely related to the patient's anasarca rather than acute cholecystitis. 8. Extensive, dense atheromatous arterial calcifications, including the coronary arteries and aorta. 9. Bilateral renal atrophy.  Aortic Atherosclerosis (ICD10-I70.0).   Electronically Signed   By: Elspeth Bathe M.D.   On: 09/08/2023 17:27    MRI Brain WO Contrast [[id number]] Collected: 09/07/23 0336   Order Status: Completed Updated: 09/07/23 0348   Narrative:     CLINICAL DATA:  Initial evaluation for acute dizziness.  EXAM: MRI HEAD WITHOUT CONTRAST  TECHNIQUE: Multiplanar, multiecho pulse sequences of the brain and surrounding structures were obtained without intravenous contrast.  COMPARISON:  Prior CT from 08/28/2023  FINDINGS: Brain: Cerebral volume within normal limits. Mild hazy T2/FLAIR hyperintensity involving the periventricular white matter and pons, most characteristic of chronic microvascular ischemic disease.  No abnormal foci of restricted diffusion to suggest acute or subacute ischemia. No areas of chronic cortical infarction. No acute intracranial hemorrhage. Prominent chronic microhemorrhage noted within the left temporal lobe (series 12, image 37). Additional punctate chronic microhemorrhage noted within the right cerebellum (series 12, image 22).  No mass lesion, midline shift or mass effect noted no hydrocephalus or extra-axial fluid collection. Pituitary gland within  normal limits.  Vascular: Right vertebral artery hypoplastic and not well seen. Major intracranial vascular flow voids are otherwise well maintained.  Skull and upper cervical spine: Craniocervical junction within normal limits. Heterogeneous and decreased T1 signal intensity noted within the visualized bone marrow, nonspecific, but most commonly related to anemia, smoking or obesity. No scalp soft tissue abnormality.  Sinuses/Orbits: Prior bilateral ocular lens replacement. Sequelae of chronic sphenoid sinusitis noted. Paranasal sinuses are otherwise largely clear. Trace bilateral mastoid effusions noted, of doubtful significance. Image  Other: None.    Impression:     1. No acute intracranial abnormality. 2. Mild chronic microvascular ischemic disease for age.   Electronically Signed   By: Morene Hoard M.D.   On: 09/07/2023 03:45    CT Angio Chest Pulmonary Embolism [[id number]] Collected: 09/05/23 1709   Order Status: Completed Updated: 09/05/23 1723   Narrative:     CLINICAL DATA:  Shortness of breath. Recent hospitalization due to sepsis and osteomyelitis. Lightheadedness/dizziness, ringing in the ears, and shortness of breath with low at O2 sat.  EXAM: CT ANGIOGRAPHY CHEST WITH CONTRAST  TECHNIQUE: Multidetector CT imaging of the chest was performed using the standard protocol during bolus administration of intravenous contrast. Multiplanar CT image reconstructions and MIPs were obtained to evaluate the vascular anatomy.  RADIATION DOSE REDUCTION: This exam was performed according to the departmental dose-optimization program which includes automated exposure control, adjustment of the mA and/or kV according to patient size and/or use of iterative reconstruction technique.  CONTRAST:  80 mL iohexoL  (OMNIPAQUE ) 350 mg iodine/mL injection (MDV) 80 mL  COMPARISON:  06/10/2008.  FINDINGS: Cardiovascular: The heart is enlarged and there is a  small pericardial effusion. Multi-vessel coronary artery calcifications are noted. A right internal jugular central venous catheter is noted in the superior vena cava. There is atherosclerotic calcification of the aorta without evidence aneurysm. The pulmonary  trunk is mildly distended suggesting underlying pulmonary artery hypertension. No evidence of pulmonary embolism is seen.  Mediastinum/Nodes: No mediastinal, hilar, or axillary lymphadenopathy. The thyroid  gland, trachea, and esophagus are within normal limits.  Lungs/Pleura: There are moderate bilateral pleural effusions, greater on the right than on the left. Focal airspace disease is present in the left upper lobe and to a smaller extent in the right upper lobe. There is consolidation in the lower lobes bilaterally. No pneumothorax is seen.  Upper Abdomen: Hyperdense material in a stone present within the gallbladder. There is bilateral renal atrophy. Mild-to-moderate ascites is present in the upper abdomen.  Musculoskeletal: There is diffuse anasarca. Degenerative changes are present in the thoracic spine. No acute osseous abnormality is seen  Review of the MIP images confirms the above findings.    Impression:     1. No evidence of pulmonary embolism. 2. Moderate bilateral pleural effusions. 3. Ground-glass opacities in the upper lobes with consolidation at the lung bases, possible edema or infiltrate. 4. Anasarca and mild-to-moderate ascites. 5. Aortic atherosclerosis and coronary artery calcifications.   Electronically Signed   By: Leita Birmingham M.D.   On: 09/05/2023 17:21    XR Chest 1 View [[id number]] Collected: 09/05/23 1609   Order Status: Completed Updated: 09/05/23 1613   Narrative:     CLINICAL DATA:  Shortness of breath.  EXAM: CHEST  1 VIEW  COMPARISON:  08/28/2023.  FINDINGS: Trachea is midline. Heart is enlarged, stable. Thoracic aorta is calcified. Right IJ central line tip is in the right  atrium. Nodular airspace opacification in the apical left upper lobe. Left lower lobe collapse/consolidation. Hazy opacification in the right lower lobe. Small bilateral pleural effusions.    Impression:     1. Nodular and patchy airspace opacification in the lungs bilaterally, increased slightly from 08/28/2023, indicative of pneumonia. Followup PA and lateral chest X-ray is recommended in 3-4 weeks following trial of antibiotic therapy to ensure resolution and exclude underlying malignancy. 2. Small bilateral pleural effusions.   Electronically Signed   By: Newell Eke M.D.   On: 09/05/2023 16:11          ASSESSMENT/PLAN: 1)ESRD, diabetes-continue on hemodialysis TTS schedule.  Limit interdialytic fluid intake due to chronic hyponatremia. 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 4)Hypotension-significant IDH on high-dose midodrine  3 times daily.  Likely has higher target weight compared to 70 kg previously. 5) Bilateral knee and wrist pain-arthrocentesis with purulent fluid removal 08/06/2023. 6) Type 2 diabetes-glucose improved, continue insulin  based regimen. 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 8) SVT-off amiodarone.   Howell King MD, FASN 09/14/2023 11:24 AM

## 2023-09-15 ENCOUNTER — Ambulatory Visit: Admitting: Cardiology

## 2023-09-15 NOTE — Progress Notes (Signed)
 [facility] Nephrology Progress Note  Follow up for ESRD.  Hospital Day: 10  Subjective: Interval History: Laying comfortably in bed.  O2 sat stable at room air. Blood pressure well-controlled on midodrine . Tolerated HD yesterday with UF 3.5 L, postweight 73 kg.   Review of Systems: negative except as noted above  Objective:   Vital signs for last 24 hours: Temp:  [97.4 F (36.3 C)-98.6 F (37 C)] 98 F (36.7 C) Heart Rate:  [56-72] 72 Resp:  [16-18] 17 BP: (90-143)/(52-68) 134/60 Today's weight: 76.2 kg (167 lb 15.9 oz)  Intake/Output last 24 hours:  Intake/Output Summary (Last 24 hours) at 09/15/2023 1019 Last data filed at 09/15/2023 0800 Gross per 24 hour  Intake 660 ml  Output 2000 ml  Net -1340 ml    Medications: Scheduled Meds: calcium  carbonate-vitamin D3, 1 tablet, oral, Daily FLUoxetine , 20 mg, oral, Daily gabapentin , 300 mg, oral, Nightly heparin  (porcine), 5,000 Units, subcutaneous, Q8H SCH insulin  aspart (NovoLOG )/insulin  lispro (HumaLOG) injection, 0-6 Units, subcutaneous, With meals & at bedtime levothyroxine, 50 mcg, oral, Daily midodrine , 10 mg, oral, TID AC omeprazole, 20 mg, oral, Daily 0600 rOPINIRole , 1 mg, oral, Once per day on Sunday Monday Wednesday Friday rOPINIRole , 1 mg, oral, Once per day on Tuesday Thursday Saturday  And rOPINIRole , 1 mg, oral, Once per day on Tuesday Thursday Saturday sevelamer  carbonate, 1,600 mg, oral, TID with meals traZODone, 50 mg, oral, At Bedtime    PRN Meds: .  acetaminophen  .  alteplase  .  benzonatate .  cyclobenzaprine .  dextrose  .  dextrose  .  melatonin .  sodium chloride  .  zolpidem    Physical Exam: General appearance: alert, appears stated age, cooperative, and no distress Head: normocephalic, atraumatic Eyes: Sclera anicteric, no conjunctival pallor Lungs: Bilateral crackles present. Heart: s1s2 present, regular rate and rhythm, systolic murmur bilateral upper sternal borders Abdomen: is flat,  soft, no tenderness, bowel sounds present.   Extremities: Bilateral TMA, no significant lower extremity edema present Skin: Normal with no rash Neurologic: alert and oriented times 3 Access-Right upper extremity AV fistula.  Labs Recent Results (from the past 48 hours)  POC Glucose   Collection Time: 09/13/23 11:49 AM  Result Value Ref Range   Glucose, POC 97 70 - 99 mg/dL  POC Glucose   Collection Time: 09/13/23  4:06 PM  Result Value Ref Range   Glucose, POC 98 70 - 99 mg/dL  POC Glucose   Collection Time: 09/13/23  7:43 PM  Result Value Ref Range   Glucose, POC 89 70 - 99 mg/dL  Magnesium    Collection Time: 09/14/23  5:41 AM  Result Value Ref Range   Magnesium  2.2 1.9 - 2.7 mg/dL  Phosphorus   Collection Time: 09/14/23  5:41 AM  Result Value Ref Range   Phosphorus 3.9 2.5 - 5.0 mg/dL  Basic Metabolic Panel   Collection Time: 09/14/23  5:41 AM  Result Value Ref Range   Sodium 127 (L) 136 - 145 mmol/L   Potassium 4.0 3.4 - 4.5 mmol/L   Chloride 91 (L) 98 - 107 mmol/L   CO2 26 21 - 31 mmol/L   Anion Gap 10 6 - 14 mmol/L   Glucose, Random 76 70 - 99 mg/dL   Blood Urea Nitrogen (BUN) 29 (H) 7 - 25 mg/dL   Creatinine 5.80 (H) 9.39 - 1.20 mg/dL   eGFR 12 (L) >40 fO/fpw/8.26f7   Calcium  9.4 8.6 - 10.3 mg/dL   BUN/Creatinine Ratio 6.9 (L) 10.0 - 20.0  CBC with Differential  Collection Time: 09/14/23  5:41 AM  Result Value Ref Range   WBC 7.36 4.40 - 11.00 10*3/uL   RBC 3.23 (L) 4.10 - 5.10 10*6/uL   Hemoglobin 10.2 (L) 12.3 - 15.3 g/dL   Hematocrit 68.6 (L) 64.0 - 44.6 %   Mean Corpuscular Volume (MCV) 97.1 (H) 80.0 - 96.0 fL   Mean Corpuscular Hemoglobin (MCH) 31.5 27.5 - 33.2 pg   Mean Corpuscular Hemoglobin Conc (MCHC) 32.4 (L) 33.0 - 37.0 g/dL   Red Cell Distribution Width (RDW) 19.7 (H) 12.3 - 17.0 %   Platelet Count (PLT) 134 (L) 150 - 450 10*3/uL   Mean Platelet Volume (MPV) 7.9 6.8 - 10.2 fL   Neutrophils % 67 %   Lymphocytes % 20 %   Monocytes % 10 %    Eosinophils % 2 %   Basophils % 1 %   Neutrophils Absolute 4.90 1.80 - 7.80 10*3/uL   Lymphocytes # 1.50 1.00 - 4.80 10*3/uL   Monocytes # 0.70 0.00 - 0.80 10*3/uL   Eosinophils # 0.10 0.00 - 0.50 10*3/uL   Basophils # 0.10 0.00 - 0.20 10*3/uL  POC Glucose   Collection Time: 09/14/23  6:54 AM  Result Value Ref Range   Glucose, POC 84 70 - 99 mg/dL  POC Glucose   Collection Time: 09/14/23 11:31 AM  Result Value Ref Range   Glucose, POC 77 70 - 99 mg/dL  POC Glucose   Collection Time: 09/14/23  4:43 PM  Result Value Ref Range   Glucose, POC 40 (LL) 70 - 99 mg/dL  POC Glucose   Collection Time: 09/14/23  5:02 PM  Result Value Ref Range   Glucose, POC 46 (LL) 70 - 99 mg/dL  POC Glucose   Collection Time: 09/14/23  5:26 PM  Result Value Ref Range   Glucose, POC 41 (LL) 70 - 99 mg/dL  POC Glucose   Collection Time: 09/14/23  5:48 PM  Result Value Ref Range   Glucose, POC 38 (LL) 70 - 99 mg/dL  POC Glucose   Collection Time: 09/14/23  6:12 PM  Result Value Ref Range   Glucose, POC 46 (LL) 70 - 99 mg/dL  POC Glucose   Collection Time: 09/14/23  6:41 PM  Result Value Ref Range   Glucose, POC 58 (L) 70 - 99 mg/dL  POC Glucose   Collection Time: 09/14/23  7:03 PM  Result Value Ref Range   Glucose, POC 106 (H) 70 - 99 mg/dL  POC Glucose   Collection Time: 09/14/23  8:03 PM  Result Value Ref Range   Glucose, POC 111 (H) 70 - 99 mg/dL  POC Glucose   Collection Time: 09/14/23 10:22 PM  Result Value Ref Range   Glucose, POC 105 (H) 70 - 99 mg/dL  Magnesium    Collection Time: 09/15/23  6:02 AM  Result Value Ref Range   Magnesium  2.0 1.9 - 2.7 mg/dL  Phosphorus   Collection Time: 09/15/23  6:02 AM  Result Value Ref Range   Phosphorus 2.9 2.5 - 5.0 mg/dL  Basic Metabolic Panel   Collection Time: 09/15/23  6:02 AM  Result Value Ref Range   Sodium 129 (L) 136 - 145 mmol/L   Potassium 4.1 3.4 - 4.5 mmol/L   Chloride 94 (L) 98 - 107 mmol/L   CO2 26 21 - 31 mmol/L   Anion  Gap 9 6 - 14 mmol/L   Glucose, Random 160 (H) 70 - 99 mg/dL   Blood Urea Nitrogen (BUN) 19 7 -  25 mg/dL   Creatinine 7.02 (H) 9.39 - 1.20 mg/dL   eGFR 17 (L) >40 fO/fpw/8.26f7   Calcium  8.9 8.6 - 10.3 mg/dL   BUN/Creatinine Ratio 6.4 (L) 10.0 - 20.0  CBC with Differential   Collection Time: 09/15/23  6:02 AM  Result Value Ref Range   WBC 6.68 4.40 - 11.00 10*3/uL   RBC 3.07 (L) 4.10 - 5.10 10*6/uL   Hemoglobin 9.5 (L) 12.3 - 15.3 g/dL   Hematocrit 70.1 (L) 64.0 - 44.6 %   Mean Corpuscular Volume (MCV) 96.8 (H) 80.0 - 96.0 fL   Mean Corpuscular Hemoglobin (MCH) 30.8 27.5 - 33.2 pg   Mean Corpuscular Hemoglobin Conc (MCHC) 31.9 (L) 33.0 - 37.0 g/dL   Red Cell Distribution Width (RDW) 19.6 (H) 12.3 - 17.0 %   Platelet Count (PLT) 140 (L) 150 - 450 10*3/uL   Mean Platelet Volume (MPV) 8.0 6.8 - 10.2 fL   Neutrophils % 74 %   Lymphocytes % 14 %   Monocytes % 10 %   Eosinophils % 2 %   Basophils % 1 %   Neutrophils Absolute 4.90 1.80 - 7.80 10*3/uL   Lymphocytes # 0.90 (L) 1.00 - 4.80 10*3/uL   Monocytes # 0.60 0.00 - 0.80 10*3/uL   Eosinophils # 0.10 0.00 - 0.50 10*3/uL   Basophils # 0.10 0.00 - 0.20 10*3/uL  Morphology Review   Collection Time: 09/15/23  6:02 AM  Result Value Ref Range   RBC Morphology Reviewed and Unremarkable    WBC Morphology Reviewed and Unremarkable   POC Glucose   Collection Time: 09/15/23  6:08 AM  Result Value Ref Range   Glucose, POC 178 (H) 70 - 99 mg/dL  POC Glucose   Collection Time: 09/15/23  8:17 AM  Result Value Ref Range   Glucose, POC 201 (H) 70 - 99 mg/dL    Radiology Results (last 14 days)     Procedure Component Value Units Date/Time   IR Tunneled Cath Removal [[id number]] Collected: 09/13/23 1006   Order Status: Completed Updated: 09/13/23 1049   Narrative:     INDICATION: 59 year old female status post tunneled CVC placement on 08/23/2023 for antibiotic. Patient finished antibiotic treatment.  EXAM: REMOVAL OF TUNNELED CENTRAL  VENOUS CATHETER  MEDICATIONS: None  COMPLICATIONS: None immediate.  PROCEDURE: Informed verbal consent was obtained from the patient following an explanation of the procedure, risks, benefits and alternatives to treatment. A time out was performed prior to the initiation of the procedure.  Sterile technique was utilized including mask, sterile gloves, sterile drape, and hand hygiene.  ChloraPrep was used to prep the patient's right neck, chest and existing catheter.  The catheter was removed intact by applying manual retraction. Hemostasis was obtained with manual compression. A dressing was placed. The patient tolerated the procedure well without immediate post procedural complication.    Impression:     Successful removal of tunneled central venous catheter.  Performed by: Aimee Han, PA-C   Electronically Signed   By: Cordella Banner   On: 09/13/2023 10:35    US  Chest [[id number]] Collected: 09/09/23 1550   Order Status: Completed Updated: 09/09/23 1720   Narrative:     CLINICAL DATA:  Limited ultrasound of the right chest done prior to possible thoracentesis.  EXAM: CHEST ULTRASOUND  COMPARISON:  CT scan of the chest performed September 08, 2023  FINDINGS: There is only a small right pleural effusion present with no window to allow for safe approach for thoracentesis.    Impression:  Small right pleural effusion. No window to allow for safe approach for thoracentesis.  Procedure performed by: Sari Lamp, PA-C   Electronically Signed   By: Juliene Balder M.D.   On: 09/09/2023 17:18    US  Thoracentesis W Imaging Left [[id number]] Collected: 09/09/23 1544   Order Status: Completed Updated: 09/09/23 1703   Narrative:     INDICATION: Renal failure on hemodialysis with fluid overload and bilateral pleural effusions. Request for therapeutic thoracentesis.  EXAM: ULTRASOUND GUIDED LEFT THORACENTESIS  MEDICATIONS: 1% lidocaine  8  mL  COMPLICATIONS: None immediate.  PROCEDURE: An ultrasound guided thoracentesis was thoroughly discussed with the patient and questions answered. The benefits, risks, alternatives and complications were also discussed. The patient understands and wishes to proceed with the procedure. Written consent was obtained.  Ultrasound was performed to localize and mark an adequate pocket of fluid in the left chest. The area was then prepped and draped in the normal sterile fashion. 1% Lidocaine  was used for local anesthesia. Under ultrasound guidance a 6 Fr Safe-T-Centesis catheter was introduced. Thoracentesis was performed. The catheter was removed and a dressing applied.  FINDINGS: A total of approximately 500 mL of clear yellow fluid was removed.    Impression:     Successful ultrasound guided left thoracentesis yielding 500 mL of pleural fluid.  No pneumothorax on post-procedure chest x-ray.  Procedure performed by: Sari Lamp, PA-C   Electronically Signed   By: Juliene Balder M.D.   On: 09/09/2023 17:00    XR Chest 1 View [[id number]] Collected: 09/09/23 1555   Order Status: Completed Updated: 09/09/23 1602   Narrative:     CLINICAL DATA:  post-thora.  EXAM: CHEST  1 VIEW  COMPARISON:  09/05/2023.  FINDINGS: Low lung volume. There is moderate diffuse pulmonary vascular congestion, progressed since the prior study. There are heterogeneous opacities overlying the right lower lung, which are new since the prior study and may represent underlying atelectasis. Correlate clinically for superimposed pneumonia.  There is at least small right pleural effusion, increased since the prior chest radiograph from 09/05/2023. Comparison with prior chest CT scan from 09/08/2023 is difficult due to differences in technique and layering effusions. There is also small left pleural effusion, which also appears increased since the prior chest radiograph. No pneumothorax on either  side.  Stable mildly enlarged cardio-mediastinal silhouette.  No acute osseous abnormalities.  The soft tissues are within normal limits.    Impression:     1. Findings favor interval worsening of congestive heart failure/pulmonary edema. 2. There are heterogeneous opacities overlying the right lower lung, which are new since the prior study and may represent underlying atelectasis. Correlate clinically for superimposed pneumonia. 3. There is at least small right pleural effusion, increased since the prior chest radiograph from 09/05/2023. There is also small left pleural effusion, which also appears increased since the prior chest radiograph.   Electronically Signed   By: Ree Molt M.D.   On: 09/09/2023 15:59    CT Chest WO Contrast [[id number]] Collected: 09/08/23 1712   Order Status: Completed Updated: 09/08/23 1729   Narrative:     CLINICAL DATA:  Shortness of breath and hypoxia.  EXAM: CT CHEST WITHOUT CONTRAST  TECHNIQUE: Multidetector CT imaging of the chest was performed following the standard protocol without IV contrast.  RADIATION DOSE REDUCTION: This exam was performed according to the departmental dose-optimization program which includes automated exposure control, adjustment of the mA and/or kV according to patient size and/or use of iterative reconstruction technique.  COMPARISON:  Chest CTA dated 09/05/2023.  FINDINGS: Cardiovascular: Stable enlarged heart and dense mitral valve annulus calcifications. Atheromatous calcifications, including the coronary arteries and aorta. These are dense and confluence over long segments of the coronary arteries. Small pericardial effusion without significant change.  Mediastinum/Nodes: Medium to high density ingested material in the stomach and distal esophagus with mild dilatation of the distal esophagus. Mild-to-moderate diffuse esophageal wall thickening more proximally in the distal esophagus.  Normal-appearing trachea. No enlarged lymph nodes. Unremarkable thyroid  gland.  Lungs/Pleura: Moderate-sized bilateral pleural effusions with mild improvement. Bilateral lower lobe atelectasis. Mild decrease in density and increase in extent of ground-glass opacities in the left upper lobe.  Upper Abdomen: Small amount of ascites without significant change. Diffuse subcutaneous edema. Extensive, dense atheromatous arterial calcifications. Bilateral renal atrophy. Vicariously excreted contrast in the gallbladder as well as 5 mm gallstone in the gallbladder. Mild diffuse gallbladder wall thickening with less pericholecystic fluid than previously.  Musculoskeletal: Mild thoracic spine degenerative changes.    Impression:     1. Mild improvement in moderate-sized bilateral pleural effusions and bilateral lower lobe atelectasis. 2. Mild decrease in density and increase in extent of ground-glass opacities in the left upper lobe. This has an appearance most compatible with pneumonitis. 3. Stable cardiomegaly and small pericardial effusion. 4. Stable small amount of ascites. 5. Stable diffuse subcutaneous edema. 6. Medium to high density ingested material in the stomach and distal esophagus with mild dilatation of the distal esophagus and mild-to-moderate diffuse distal esophageal wall thickening above the level of dilatation. These changes are most compatible with gastroesophageal reflux and associated reflux esophagitis. 7. Cholelithiasis with mild diffuse gallbladder wall thickening and less pericholecystic fluid than previously. The wall thickening is most likely related to the patient's anasarca rather than acute cholecystitis. 8. Extensive, dense atheromatous arterial calcifications, including the coronary arteries and aorta. 9. Bilateral renal atrophy.  Aortic Atherosclerosis (ICD10-I70.0).   Electronically Signed   By: Elspeth Bathe M.D.   On: 09/08/2023 17:27    MRI  Brain WO Contrast [[id number]] Collected: 09/07/23 0336   Order Status: Completed Updated: 09/07/23 0348   Narrative:     CLINICAL DATA:  Initial evaluation for acute dizziness.  EXAM: MRI HEAD WITHOUT CONTRAST  TECHNIQUE: Multiplanar, multiecho pulse sequences of the brain and surrounding structures were obtained without intravenous contrast.  COMPARISON:  Prior CT from 08/28/2023  FINDINGS: Brain: Cerebral volume within normal limits. Mild hazy T2/FLAIR hyperintensity involving the periventricular white matter and pons, most characteristic of chronic microvascular ischemic disease.  No abnormal foci of restricted diffusion to suggest acute or subacute ischemia. No areas of chronic cortical infarction. No acute intracranial hemorrhage. Prominent chronic microhemorrhage noted within the left temporal lobe (series 12, image 37). Additional punctate chronic microhemorrhage noted within the right cerebellum (series 12, image 22).  No mass lesion, midline shift or mass effect noted no hydrocephalus or extra-axial fluid collection. Pituitary gland within normal limits.  Vascular: Right vertebral artery hypoplastic and not well seen. Major intracranial vascular flow voids are otherwise well maintained.  Skull and upper cervical spine: Craniocervical junction within normal limits. Heterogeneous and decreased T1 signal intensity noted within the visualized bone marrow, nonspecific, but most commonly related to anemia, smoking or obesity. No scalp soft tissue abnormality.  Sinuses/Orbits: Prior bilateral ocular lens replacement. Sequelae of chronic sphenoid sinusitis noted. Paranasal sinuses are otherwise largely clear. Trace bilateral mastoid effusions noted, of doubtful significance. Image  Other: None.    Impression:     1.  No acute intracranial abnormality. 2. Mild chronic microvascular ischemic disease for age.   Electronically Signed   By: Morene Hoard M.D.    On: 09/07/2023 03:45    CT Angio Chest Pulmonary Embolism [[id number]] Collected: 09/05/23 1709   Order Status: Completed Updated: 09/05/23 1723   Narrative:     CLINICAL DATA:  Shortness of breath. Recent hospitalization due to sepsis and osteomyelitis. Lightheadedness/dizziness, ringing in the ears, and shortness of breath with low at O2 sat.  EXAM: CT ANGIOGRAPHY CHEST WITH CONTRAST  TECHNIQUE: Multidetector CT imaging of the chest was performed using the standard protocol during bolus administration of intravenous contrast. Multiplanar CT image reconstructions and MIPs were obtained to evaluate the vascular anatomy.  RADIATION DOSE REDUCTION: This exam was performed according to the departmental dose-optimization program which includes automated exposure control, adjustment of the mA and/or kV according to patient size and/or use of iterative reconstruction technique.  CONTRAST:  80 mL iohexoL  (OMNIPAQUE ) 350 mg iodine/mL injection (MDV) 80 mL  COMPARISON:  06/10/2008.  FINDINGS: Cardiovascular: The heart is enlarged and there is a small pericardial effusion. Multi-vessel coronary artery calcifications are noted. A right internal jugular central venous catheter is noted in the superior vena cava. There is atherosclerotic calcification of the aorta without evidence aneurysm. The pulmonary trunk is mildly distended suggesting underlying pulmonary artery hypertension. No evidence of pulmonary embolism is seen.  Mediastinum/Nodes: No mediastinal, hilar, or axillary lymphadenopathy. The thyroid  gland, trachea, and esophagus are within normal limits.  Lungs/Pleura: There are moderate bilateral pleural effusions, greater on the right than on the left. Focal airspace disease is present in the left upper lobe and to a smaller extent in the right upper lobe. There is consolidation in the lower lobes bilaterally. No pneumothorax is seen.  Upper Abdomen: Hyperdense material in  a stone present within the gallbladder. There is bilateral renal atrophy. Mild-to-moderate ascites is present in the upper abdomen.  Musculoskeletal: There is diffuse anasarca. Degenerative changes are present in the thoracic spine. No acute osseous abnormality is seen  Review of the MIP images confirms the above findings.    Impression:     1. No evidence of pulmonary embolism. 2. Moderate bilateral pleural effusions. 3. Ground-glass opacities in the upper lobes with consolidation at the lung bases, possible edema or infiltrate. 4. Anasarca and mild-to-moderate ascites. 5. Aortic atherosclerosis and coronary artery calcifications.   Electronically Signed   By: Leita Birmingham M.D.   On: 09/05/2023 17:21    XR Chest 1 View [[id number]] Collected: 09/05/23 1609   Order Status: Completed Updated: 09/05/23 1613   Narrative:     CLINICAL DATA:  Shortness of breath.  EXAM: CHEST  1 VIEW  COMPARISON:  08/28/2023.  FINDINGS: Trachea is midline. Heart is enlarged, stable. Thoracic aorta is calcified. Right IJ central line tip is in the right atrium. Nodular airspace opacification in the apical left upper lobe. Left lower lobe collapse/consolidation. Hazy opacification in the right lower lobe. Small bilateral pleural effusions.    Impression:     1. Nodular and patchy airspace opacification in the lungs bilaterally, increased slightly from 08/28/2023, indicative of pneumonia. Followup PA and lateral chest X-ray is recommended in 3-4 weeks following trial of antibiotic therapy to ensure resolution and exclude underlying malignancy. 2. Small bilateral pleural effusions.   Electronically Signed   By: Newell Eke M.D.   On: 09/05/2023 16:11            Assessment/Plan: 1)ESRD, diabetes-Continue on HD TTS schedule.Discussed  importance of interdialytic fluid restriction given baseline serum sodium. 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 3)Anemia  of ESRD-H&H stable and at goal.  ESA per outpatient protocol. 4)HTN-blood pressure low normal and pressor dependence, maintain MAP> 60.  Continue on midodrine . 5)Bilateral knee and right wrist pain-bilateral knee arthrocentesis with purulent fluid removed 08/06/23.  6)Type 2 diabetes-blood glucose improved, continue insulin  based regimen. 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 8) SVT-continue on oral amiodarone.   Howell King MD, FASN 09/15/2023 10:19 AM

## 2023-09-16 ENCOUNTER — Emergency Department (HOSPITAL_COMMUNITY)

## 2023-09-16 ENCOUNTER — Other Ambulatory Visit: Payer: Self-pay

## 2023-09-16 ENCOUNTER — Inpatient Hospital Stay (HOSPITAL_COMMUNITY)

## 2023-09-16 ENCOUNTER — Encounter (HOSPITAL_COMMUNITY): Payer: Self-pay

## 2023-09-16 ENCOUNTER — Inpatient Hospital Stay (HOSPITAL_COMMUNITY)
Admission: EM | Admit: 2023-09-16 | Discharge: 2023-09-27 | DRG: 208 | Disposition: E | Source: Skilled Nursing Facility | Attending: Pulmonary Disease | Admitting: Pulmonary Disease

## 2023-09-16 DIAGNOSIS — Z515 Encounter for palliative care: Secondary | ICD-10-CM

## 2023-09-16 DIAGNOSIS — J9601 Acute respiratory failure with hypoxia: Secondary | ICD-10-CM | POA: Diagnosis present

## 2023-09-16 DIAGNOSIS — Z882 Allergy status to sulfonamides status: Secondary | ICD-10-CM

## 2023-09-16 DIAGNOSIS — G40419 Other generalized epilepsy and epileptic syndromes, intractable, without status epilepticus: Secondary | ICD-10-CM | POA: Diagnosis not present

## 2023-09-16 DIAGNOSIS — I270 Primary pulmonary hypertension: Secondary | ICD-10-CM | POA: Diagnosis present

## 2023-09-16 DIAGNOSIS — G9349 Other encephalopathy: Secondary | ICD-10-CM | POA: Diagnosis present

## 2023-09-16 DIAGNOSIS — G931 Anoxic brain damage, not elsewhere classified: Secondary | ICD-10-CM | POA: Diagnosis present

## 2023-09-16 DIAGNOSIS — G9340 Encephalopathy, unspecified: Secondary | ICD-10-CM | POA: Diagnosis not present

## 2023-09-16 DIAGNOSIS — J690 Pneumonitis due to inhalation of food and vomit: Secondary | ICD-10-CM | POA: Diagnosis present

## 2023-09-16 DIAGNOSIS — I501 Left ventricular failure: Secondary | ICD-10-CM | POA: Diagnosis present

## 2023-09-16 DIAGNOSIS — I132 Hypertensive heart and chronic kidney disease with heart failure and with stage 5 chronic kidney disease, or end stage renal disease: Secondary | ICD-10-CM | POA: Diagnosis present

## 2023-09-16 DIAGNOSIS — E1122 Type 2 diabetes mellitus with diabetic chronic kidney disease: Secondary | ICD-10-CM | POA: Diagnosis present

## 2023-09-16 DIAGNOSIS — I249 Acute ischemic heart disease, unspecified: Secondary | ICD-10-CM | POA: Diagnosis present

## 2023-09-16 DIAGNOSIS — E11319 Type 2 diabetes mellitus with unspecified diabetic retinopathy without macular edema: Secondary | ICD-10-CM | POA: Diagnosis present

## 2023-09-16 DIAGNOSIS — Z66 Do not resuscitate: Secondary | ICD-10-CM | POA: Diagnosis present

## 2023-09-16 DIAGNOSIS — R569 Unspecified convulsions: Secondary | ICD-10-CM

## 2023-09-16 DIAGNOSIS — Z794 Long term (current) use of insulin: Secondary | ICD-10-CM | POA: Diagnosis not present

## 2023-09-16 DIAGNOSIS — Z789 Other specified health status: Secondary | ICD-10-CM | POA: Diagnosis not present

## 2023-09-16 DIAGNOSIS — L8990 Pressure ulcer of unspecified site, unspecified stage: Secondary | ICD-10-CM | POA: Insufficient documentation

## 2023-09-16 DIAGNOSIS — L89153 Pressure ulcer of sacral region, stage 3: Secondary | ICD-10-CM | POA: Diagnosis present

## 2023-09-16 DIAGNOSIS — E119 Type 2 diabetes mellitus without complications: Secondary | ICD-10-CM | POA: Diagnosis not present

## 2023-09-16 DIAGNOSIS — Z992 Dependence on renal dialysis: Secondary | ICD-10-CM

## 2023-09-16 DIAGNOSIS — I959 Hypotension, unspecified: Secondary | ICD-10-CM | POA: Diagnosis present

## 2023-09-16 DIAGNOSIS — E11649 Type 2 diabetes mellitus with hypoglycemia without coma: Secondary | ICD-10-CM | POA: Diagnosis present

## 2023-09-16 DIAGNOSIS — G253 Myoclonus: Secondary | ICD-10-CM | POA: Diagnosis not present

## 2023-09-16 DIAGNOSIS — E871 Hypo-osmolality and hyponatremia: Secondary | ICD-10-CM | POA: Diagnosis present

## 2023-09-16 DIAGNOSIS — I468 Cardiac arrest due to other underlying condition: Secondary | ICD-10-CM | POA: Diagnosis present

## 2023-09-16 DIAGNOSIS — G6181 Chronic inflammatory demyelinating polyneuritis: Secondary | ICD-10-CM | POA: Diagnosis present

## 2023-09-16 DIAGNOSIS — Z89432 Acquired absence of left foot: Secondary | ICD-10-CM | POA: Diagnosis not present

## 2023-09-16 DIAGNOSIS — Z79890 Hormone replacement therapy (postmenopausal): Secondary | ICD-10-CM

## 2023-09-16 DIAGNOSIS — N186 End stage renal disease: Secondary | ICD-10-CM | POA: Diagnosis present

## 2023-09-16 DIAGNOSIS — I469 Cardiac arrest, cause unspecified: Secondary | ICD-10-CM | POA: Diagnosis present

## 2023-09-16 DIAGNOSIS — E785 Hyperlipidemia, unspecified: Secondary | ICD-10-CM | POA: Diagnosis present

## 2023-09-16 DIAGNOSIS — Z8249 Family history of ischemic heart disease and other diseases of the circulatory system: Secondary | ICD-10-CM

## 2023-09-16 DIAGNOSIS — Z881 Allergy status to other antibiotic agents status: Secondary | ICD-10-CM

## 2023-09-16 DIAGNOSIS — Z800 Family history of malignant neoplasm of digestive organs: Secondary | ICD-10-CM

## 2023-09-16 DIAGNOSIS — G2581 Restless legs syndrome: Secondary | ICD-10-CM | POA: Diagnosis present

## 2023-09-16 DIAGNOSIS — D631 Anemia in chronic kidney disease: Secondary | ICD-10-CM | POA: Diagnosis present

## 2023-09-16 DIAGNOSIS — Z888 Allergy status to other drugs, medicaments and biological substances status: Secondary | ICD-10-CM

## 2023-09-16 DIAGNOSIS — D509 Iron deficiency anemia, unspecified: Secondary | ICD-10-CM | POA: Diagnosis present

## 2023-09-16 DIAGNOSIS — F411 Generalized anxiety disorder: Secondary | ICD-10-CM | POA: Diagnosis present

## 2023-09-16 DIAGNOSIS — Z7189 Other specified counseling: Secondary | ICD-10-CM | POA: Diagnosis not present

## 2023-09-16 DIAGNOSIS — Z79899 Other long term (current) drug therapy: Secondary | ICD-10-CM

## 2023-09-16 LAB — COMPREHENSIVE METABOLIC PANEL WITH GFR
ALT: 13 U/L (ref 0–44)
AST: 36 U/L (ref 15–41)
Albumin: 2.7 g/dL — ABNORMAL LOW (ref 3.5–5.0)
Alkaline Phosphatase: 140 U/L — ABNORMAL HIGH (ref 38–126)
Anion gap: 15 (ref 5–15)
BUN: 23 mg/dL — ABNORMAL HIGH (ref 6–20)
CO2: 22 mmol/L (ref 22–32)
Calcium: 10 mg/dL (ref 8.9–10.3)
Chloride: 91 mmol/L — ABNORMAL LOW (ref 98–111)
Creatinine, Ser: 3.91 mg/dL — ABNORMAL HIGH (ref 0.44–1.00)
GFR, Estimated: 13 mL/min — ABNORMAL LOW (ref >60)
Glucose, Bld: 254 mg/dL — ABNORMAL HIGH (ref 70–99)
Potassium: 3.9 mmol/L (ref 3.5–5.1)
Sodium: 128 mmol/L — ABNORMAL LOW (ref 135–145)
Total Bilirubin: 1 mg/dL (ref 0.0–1.2)
Total Protein: 5.4 g/dL — ABNORMAL LOW (ref 6.5–8.1)

## 2023-09-16 LAB — I-STAT ARTERIAL BLOOD GAS, ED
Acid-Base Excess: 0 mmol/L (ref 0.0–2.0)
Bicarbonate: 24.6 mmol/L (ref 20.0–28.0)
Calcium, Ion: 1.34 mmol/L (ref 1.15–1.40)
HCT: 28 % — ABNORMAL LOW (ref 36.0–46.0)
Hemoglobin: 9.5 g/dL — ABNORMAL LOW (ref 12.0–15.0)
O2 Saturation: 99 %
Patient temperature: 35.4
Potassium: 3.8 mmol/L (ref 3.5–5.1)
Sodium: 127 mmol/L — ABNORMAL LOW (ref 135–145)
TCO2: 26 mmol/L (ref 22–32)
pCO2 arterial: 38.1 mmHg (ref 32–48)
pH, Arterial: 7.41 (ref 7.35–7.45)
pO2, Arterial: 108 mmHg (ref 83–108)

## 2023-09-16 LAB — CBC WITH DIFFERENTIAL/PLATELET
Abs Immature Granulocytes: 0.07 K/uL (ref 0.00–0.07)
Basophils Absolute: 0 K/uL (ref 0.0–0.1)
Basophils Relative: 1 %
Eosinophils Absolute: 0.1 K/uL (ref 0.0–0.5)
Eosinophils Relative: 1 %
HCT: 30.9 % — ABNORMAL LOW (ref 36.0–46.0)
Hemoglobin: 9.2 g/dL — ABNORMAL LOW (ref 12.0–15.0)
Immature Granulocytes: 1 %
Lymphocytes Relative: 23 %
Lymphs Abs: 1.3 K/uL (ref 0.7–4.0)
MCH: 30.9 pg (ref 26.0–34.0)
MCHC: 29.8 g/dL — ABNORMAL LOW (ref 30.0–36.0)
MCV: 103.7 fL — ABNORMAL HIGH (ref 80.0–100.0)
Monocytes Absolute: 0.3 K/uL (ref 0.1–1.0)
Monocytes Relative: 5 %
Neutro Abs: 3.9 K/uL (ref 1.7–7.7)
Neutrophils Relative %: 69 %
Platelets: 176 K/uL (ref 150–400)
RBC: 2.98 MIL/uL — ABNORMAL LOW (ref 3.87–5.11)
RDW: 18.1 % — ABNORMAL HIGH (ref 11.5–15.5)
WBC: 5.6 K/uL (ref 4.0–10.5)
nRBC: 0 % (ref 0.0–0.2)

## 2023-09-16 LAB — CBC
HCT: 28.7 % — ABNORMAL LOW (ref 36.0–46.0)
Hemoglobin: 9.1 g/dL — ABNORMAL LOW (ref 12.0–15.0)
MCH: 31.4 pg (ref 26.0–34.0)
MCHC: 31.7 g/dL (ref 30.0–36.0)
MCV: 99 fL (ref 80.0–100.0)
Platelets: 185 K/uL (ref 150–400)
RBC: 2.9 MIL/uL — ABNORMAL LOW (ref 3.87–5.11)
RDW: 17.9 % — ABNORMAL HIGH (ref 11.5–15.5)
WBC: 10.7 K/uL — ABNORMAL HIGH (ref 4.0–10.5)
nRBC: 0 % (ref 0.0–0.2)

## 2023-09-16 LAB — I-STAT CHEM 8, ED
BUN: 27 mg/dL — ABNORMAL HIGH (ref 6–20)
Calcium, Ion: 1.07 mmol/L — ABNORMAL LOW (ref 1.15–1.40)
Chloride: 95 mmol/L — ABNORMAL LOW (ref 98–111)
Creatinine, Ser: 3.9 mg/dL — ABNORMAL HIGH (ref 0.44–1.00)
Glucose, Bld: 256 mg/dL — ABNORMAL HIGH (ref 70–99)
HCT: 30 % — ABNORMAL LOW (ref 36.0–46.0)
Hemoglobin: 10.2 g/dL — ABNORMAL LOW (ref 12.0–15.0)
Potassium: 4 mmol/L (ref 3.5–5.1)
Sodium: 127 mmol/L — ABNORMAL LOW (ref 135–145)
TCO2: 23 mmol/L (ref 22–32)

## 2023-09-16 LAB — HEPATITIS B SURFACE ANTIGEN: Hepatitis B Surface Ag: NONREACTIVE

## 2023-09-16 LAB — GLUCOSE, CAPILLARY
Glucose-Capillary: 268 mg/dL — ABNORMAL HIGH (ref 70–99)
Glucose-Capillary: 145 mg/dL — ABNORMAL HIGH (ref 70–99)
Glucose-Capillary: 40 mg/dL — CL (ref 70–99)
Glucose-Capillary: 81 mg/dL (ref 70–99)
Glucose-Capillary: 48 mg/dL — ABNORMAL LOW (ref 70–99)

## 2023-09-16 LAB — HIV ANTIBODY (ROUTINE TESTING W REFLEX): HIV Screen 4th Generation wRfx: NONREACTIVE

## 2023-09-16 LAB — CREATININE, SERUM
Creatinine, Ser: 4.08 mg/dL — ABNORMAL HIGH (ref 0.44–1.00)
GFR, Estimated: 12 mL/min — ABNORMAL LOW (ref >60)

## 2023-09-16 LAB — MRSA NEXT GEN BY PCR, NASAL: MRSA by PCR Next Gen: NOT DETECTED

## 2023-09-16 LAB — MAGNESIUM: Magnesium: 2.3 mg/dL (ref 1.7–2.4)

## 2023-09-16 LAB — TROPONIN I (HIGH SENSITIVITY)
Troponin I (High Sensitivity): 50 ng/L — ABNORMAL HIGH (ref <18)
Troponin I (High Sensitivity): 71 ng/L — ABNORMAL HIGH (ref <18)

## 2023-09-16 LAB — HEMOGLOBIN A1C
Hgb A1c MFr Bld: 4.6 % — ABNORMAL LOW (ref 4.8–5.6)
Mean Plasma Glucose: 85.32 mg/dL

## 2023-09-16 MED ORDER — SODIUM CHLORIDE 0.9 % IV SOLN
2.0000 g | INTRAVENOUS | Status: DC
  Administered 2023-09-16: 2 g via INTRAVENOUS
  Filled 2023-09-16: qty 20

## 2023-09-16 MED ORDER — SODIUM CHLORIDE 0.9 % IV SOLN: 1.0000 g | Freq: Once | INTRAVENOUS | Status: DC

## 2023-09-16 MED ORDER — POLYETHYLENE GLYCOL 3350 17 G PO PACK: 17.0000 g | PACK | Freq: Every day | ORAL | Status: DC | PRN

## 2023-09-16 MED ORDER — CHLORHEXIDINE GLUCONATE CLOTH 2 % EX PADS
6.0000 | MEDICATED_PAD | Freq: Every day | CUTANEOUS | Status: DC
  Administered 2023-09-18: 6 via TOPICAL

## 2023-09-16 MED ORDER — FENTANYL 2500MCG IN NS 250ML (10MCG/ML) PREMIX INFUSION: 50.0000 ug/h | INTRAVENOUS | Status: DC

## 2023-09-16 MED ORDER — ORAL CARE MOUTH RINSE
15.0000 mL | OROMUCOSAL | Status: DC
  Administered 2023-09-16 – 2023-09-19 (×34): 15 mL via OROMUCOSAL

## 2023-09-16 MED ORDER — ENOXAPARIN SODIUM 30 MG/0.3ML IJ SOSY
30.0000 mg | PREFILLED_SYRINGE | INTRAMUSCULAR | Status: DC
  Administered 2023-09-16 – 2023-09-18 (×3): 30 mg via SUBCUTANEOUS
  Filled 2023-09-16 (×3): qty 0.3

## 2023-09-16 MED ORDER — ACETAMINOPHEN 160 MG/5ML PO SOLN
650.0000 mg | ORAL | Status: DC | PRN
  Administered 2023-09-18: 650 mg
  Filled 2023-09-16 (×2): qty 20.3

## 2023-09-16 MED ORDER — SODIUM CHLORIDE 0.9 % IV SOLN
3.0000 g | Freq: Two times a day (BID) | INTRAVENOUS | Status: DC
  Filled 2023-09-16: qty 8

## 2023-09-16 MED ORDER — ONDANSETRON HCL 4 MG/2ML IJ SOLN
4.0000 mg | Freq: Four times a day (QID) | INTRAMUSCULAR | Status: DC | PRN
Start: 2023-09-16 — End: 2023-09-19

## 2023-09-16 MED ORDER — DEXTROSE 10 % IV SOLN: INTRAVENOUS | Status: DC

## 2023-09-16 MED ORDER — DOCUSATE SODIUM 50 MG/5ML PO LIQD
100.0000 mg | Freq: Two times a day (BID) | ORAL | Status: DC
  Administered 2023-09-16 – 2023-09-18 (×6): 100 mg
  Filled 2023-09-16 (×6): qty 10

## 2023-09-16 MED ORDER — DOCUSATE SODIUM 100 MG PO CAPS: 100.0000 mg | ORAL_CAPSULE | Freq: Two times a day (BID) | ORAL | Status: DC | PRN

## 2023-09-16 MED ORDER — ACETAMINOPHEN 325 MG PO TABS: 650.0000 mg | ORAL_TABLET | ORAL | Status: DC | PRN

## 2023-09-16 MED ORDER — DEXTROSE 50 % IV SOLN: 25.0000 g | INTRAVENOUS | Status: AC

## 2023-09-16 MED ORDER — ACETAMINOPHEN 160 MG/5ML PO SOLN: 650.0000 mg | ORAL | Status: DC

## 2023-09-16 MED ORDER — HEPARIN BOLUS VIA INFUSION
5000.0000 [IU] | Freq: Once | INTRAVENOUS | Status: AC
  Administered 2023-09-16: 5000 [IU] via INTRAVENOUS
  Filled 2023-09-16: qty 5000

## 2023-09-16 MED ORDER — FENTANYL 2500MCG IN NS 250ML (10MCG/ML) PREMIX INFUSION
0.0000 ug/h | INTRAVENOUS | Status: DC
  Administered 2023-09-16: 50 ug/h via INTRAVENOUS
  Filled 2023-09-16: qty 250

## 2023-09-16 MED ORDER — POLYETHYLENE GLYCOL 3350 17 G PO PACK
17.0000 g | PACK | Freq: Every day | ORAL | Status: DC
  Administered 2023-09-16 – 2023-09-18 (×2): 17 g
  Filled 2023-09-16 (×2): qty 1

## 2023-09-16 MED ORDER — NOREPINEPHRINE 4 MG/250ML-% IV SOLN
0.0000 ug/min | INTRAVENOUS | Status: DC
  Filled 2023-09-16: qty 250

## 2023-09-16 MED ORDER — MAGNESIUM SULFATE 2 GM/50ML IV SOLN: 2.0000 g | Freq: Once | INTRAVENOUS | Status: AC | PRN

## 2023-09-16 MED ORDER — SODIUM CHLORIDE 0.9 % IV SOLN
3.0000 g | Freq: Two times a day (BID) | INTRAVENOUS | Status: DC
  Administered 2023-09-16 – 2023-09-19 (×6): 3 g via INTRAVENOUS
  Filled 2023-09-16 (×6): qty 8

## 2023-09-16 MED ORDER — DEXTROSE 50 % IV SOLN
INTRAVENOUS | Status: AC
  Administered 2023-09-16: 25 g via INTRAVENOUS
  Filled 2023-09-16: qty 50

## 2023-09-16 MED ORDER — MIDAZOLAM HCL 2 MG/2ML IJ SOLN
2.0000 mg | Freq: Once | INTRAMUSCULAR | Status: DC
  Filled 2023-09-16: qty 2

## 2023-09-16 MED ORDER — PROPOFOL 1000 MG/100ML IV EMUL
0.0000 ug/kg/min | INTRAVENOUS | Status: DC
  Administered 2023-09-16: 5 ug/kg/min via INTRAVENOUS
  Administered 2023-09-17: 20 ug/kg/min via INTRAVENOUS
  Filled 2023-09-16 (×2): qty 100

## 2023-09-16 MED ORDER — HEPARIN (PORCINE) 25000 UT/250ML-% IV SOLN
1200.0000 [IU]/h | INTRAVENOUS | Status: DC
  Administered 2023-09-16: 1200 [IU]/h via INTRAVENOUS
  Filled 2023-09-16: qty 250

## 2023-09-16 MED ORDER — VALPROATE SODIUM 100 MG/ML IV SOLN
500.0000 mg | Freq: Three times a day (TID) | INTRAVENOUS | Status: DC
  Administered 2023-09-16 – 2023-09-18 (×5): 500 mg via INTRAVENOUS
  Filled 2023-09-16: qty 5
  Filled 2023-09-16 (×2): qty 500
  Filled 2023-09-16 (×2): qty 5
  Filled 2023-09-16: qty 500

## 2023-09-16 MED ORDER — LEVETIRACETAM (KEPPRA) 500 MG/5 ML ADULT IV PUSH
4500.0000 mg | Freq: Once | INTRAVENOUS | Status: AC
  Administered 2023-09-16: 4500 mg via INTRAVENOUS
  Filled 2023-09-16: qty 45

## 2023-09-16 MED ORDER — PANTOPRAZOLE SODIUM 40 MG IV SOLR
40.0000 mg | INTRAVENOUS | Status: DC
  Administered 2023-09-16 – 2023-09-17 (×2): 40 mg via INTRAVENOUS
  Filled 2023-09-16 (×2): qty 10

## 2023-09-16 MED ORDER — SODIUM CHLORIDE 0.9 % IV SOLN: 250.0000 mL | INTRAVENOUS | Status: AC

## 2023-09-16 MED ORDER — INSULIN ASPART 100 UNIT/ML IJ SOLN
0.0000 [IU] | INTRAMUSCULAR | Status: DC
  Administered 2023-09-16: 8 [IU] via SUBCUTANEOUS
  Administered 2023-09-16 – 2023-09-17 (×2): 2 [IU] via SUBCUTANEOUS
  Administered 2023-09-17: 3 [IU] via SUBCUTANEOUS

## 2023-09-16 MED ORDER — NOREPINEPHRINE 4 MG/250ML-% IV SOLN: 2.0000 ug/min | INTRAVENOUS | Status: DC

## 2023-09-16 MED ORDER — NOREPINEPHRINE 4 MG/250ML-% IV SOLN
0.0000 ug/min | INTRAVENOUS | Status: DC
  Administered 2023-09-16 – 2023-09-17 (×2): 2 ug/min via INTRAVENOUS
  Administered 2023-09-18: 4 ug/min via INTRAVENOUS
  Filled 2023-09-16 (×2): qty 250

## 2023-09-16 MED ORDER — IOHEXOL 350 MG/ML SOLN
75.0000 mL | Freq: Once | INTRAVENOUS | Status: AC | PRN
  Administered 2023-09-16: 75 mL via INTRAVENOUS

## 2023-09-16 MED ORDER — SODIUM CHLORIDE 0.9 % IV SOLN
500.0000 mg | Freq: Once | INTRAVENOUS | Status: DC
  Filled 2023-09-16: qty 5

## 2023-09-16 MED ORDER — SODIUM CHLORIDE 0.9 % IV SOLN
250.0000 mL | INTRAVENOUS | Status: DC
  Administered 2023-09-16: 250 mL via INTRAVENOUS

## 2023-09-16 MED ORDER — DEXTROSE-SODIUM CHLORIDE 5-0.9 % IV SOLN: INTRAVENOUS | Status: DC

## 2023-09-16 MED ORDER — HEPARIN SODIUM (PORCINE) 5000 UNIT/ML IJ SOLN
5000.0000 [IU] | Freq: Three times a day (TID) | INTRAMUSCULAR | Status: DC
  Filled 2023-09-16: qty 1

## 2023-09-16 MED ORDER — FENTANYL BOLUS VIA INFUSION: 50.0000 ug | INTRAVENOUS | Status: DC | PRN

## 2023-09-16 MED ORDER — ACETAMINOPHEN 650 MG RE SUPP: 650.0000 mg | RECTAL | Status: DC

## 2023-09-16 MED ORDER — ORAL CARE MOUTH RINSE: 15.0000 mL | OROMUCOSAL | Status: DC | PRN

## 2023-09-16 MED ORDER — SODIUM CHLORIDE 0.9 % IV SOLN
100.0000 mg | Freq: Two times a day (BID) | INTRAVENOUS | Status: DC
  Administered 2023-09-16: 100 mg via INTRAVENOUS
  Filled 2023-09-16: qty 100

## 2023-09-16 MED ORDER — ACETAMINOPHEN 325 MG PO TABS: 650.0000 mg | ORAL_TABLET | ORAL | Status: DC

## 2023-09-16 MED ORDER — ENOXAPARIN SODIUM 40 MG/0.4ML IJ SOSY: 40.0000 mg | PREFILLED_SYRINGE | INTRAMUSCULAR | Status: DC

## 2023-09-16 MED ORDER — LEVETIRACETAM (KEPPRA) 500 MG/5 ML ADULT IV PUSH
1000.0000 mg | Freq: Two times a day (BID) | INTRAVENOUS | Status: DC
  Administered 2023-09-17: 1000 mg via INTRAVENOUS
  Filled 2023-09-16: qty 10

## 2023-09-16 MED ORDER — FAMOTIDINE 20 MG PO TABS: 20.0000 mg | ORAL_TABLET | Freq: Two times a day (BID) | ORAL | Status: DC

## 2023-09-16 MED ORDER — CHLORHEXIDINE GLUCONATE CLOTH 2 % EX PADS
6.0000 | MEDICATED_PAD | Freq: Every day | CUTANEOUS | Status: DC
  Administered 2023-09-16 – 2023-09-17 (×3): 6 via TOPICAL

## 2023-09-16 MED ORDER — BUSPIRONE HCL 15 MG PO TABS
30.0000 mg | ORAL_TABLET | Freq: Three times a day (TID) | ORAL | Status: AC | PRN
Start: 2023-09-16 — End: 2023-09-18

## 2023-09-16 MED ORDER — FENTANYL CITRATE PF 50 MCG/ML IJ SOSY: 50.0000 ug | PREFILLED_SYRINGE | Freq: Once | INTRAMUSCULAR | Status: DC

## 2023-09-16 MED ORDER — ACETAMINOPHEN 650 MG RE SUPP: 650.0000 mg | RECTAL | Status: DC | PRN

## 2023-09-16 MED ORDER — VALPROATE SODIUM 100 MG/ML IV SOLN
2000.0000 mg | Freq: Once | INTRAVENOUS | Status: AC
  Administered 2023-09-16: 2000 mg via INTRAVENOUS
  Filled 2023-09-16: qty 20

## 2023-09-16 NOTE — ED Notes (Signed)
 Phlebotomy to draw troponin

## 2023-09-16 NOTE — Progress Notes (Signed)
 eLink Physician-Brief Progress Note Patient Name: Holly [name] DOB: September 19, 1965 MRN: [id number]   Date of Service  09/16/2023  HPI/Events of Note  Patient with hypoglycemia.  eICU Interventions  D5 % NS gtt ordered.        [name] [name] 09/16/2023, 9:02 PM

## 2023-09-16 NOTE — Progress Notes (Signed)
 RT transported pt on ventilator from ED trauma A to 2M06 without any complications.

## 2023-09-16 NOTE — Consult Note (Incomplete)
 Renal Service Consult Note [facility] [name], MD  Patient: Holly [name] Hand Date: 09/16/2023 Requesting Physician: Dr. Harold   Reason for Consult: ESRD pt s/p cardiac arrest  HPI: The patient is a 58 y.o. year-old w/ PMH as below who presented by EMS from [facility].  Patient apparently was having trouble breathing and when the staff went to help her putting oxygen on patient collapsed falling face forward.  CPR was initiated.  It was a PEA cardiac arrest.  She had 3 rounds of epinephrine also received calcium  and bicarbonate.  She is brought to the emergency room.  In the ED CT of the head and cervical spine were negative, chest x-ray showed some left basilar collapse/consolidation.  IV Rocephin  and Zithromax  were given for possible pneumonia.  Patient was admitted to the ICU. She is having myoclonus and neurology is consulting for possible anoxic brain injury.  We are asked to see for ESRD.   Pt seen in ICU. Per [facility] it appears she has been in and out of an OSH [facility]) for much of July and August this summer. Her last OP HD session at her HD unit in [place] was on 8/09.   Patient is unresponsive on the vent.  No history obtained   ROS -N/A   Past Medical History  Past Medical History:  Diagnosis Date   Anxiety state, unspecified    Background diabetic retinopathy(362.01)    Bulimia    Calculus of kidney    Cellulitis and abscess of foot 01/24/2015   left foot   Chronic inflammatory demyelinating polyneuritis (HCC)    Chronic kidney disease, stage IV (severe) (HCC)    DM2 (diabetes mellitus, type 2) (HCC)    Dysthymic disorder    Esophageal reflux    Essential hypertension, benign    Heart murmur    been told very slight, never given her any problems   History of blood transfusion X 6-7; 2010 - present (03/29/2014)   related to OR; kidney issues   HLD (hyperlipidemia)    HDL goal >50, LDL goal <100   Iron deficiency anemia    suppose  to get procrit  injections q 3 wks; usually don't do it (03/29/2014)   Irritable bowel syndrome    Left heart failure (HCC)    Primary pulmonary hypertension (HCC)    not seen at CATH  (35mm Hg), pt not aware of this   RLS (restless legs syndrome)    Past Surgical History  Past Surgical History:  Procedure Laterality Date   AMPUTATION Left 01/26/2015   Procedure: POST TRANS MET ;  Surgeon: Jerona Harden GAILS, MD;  Location: [facility] OR;  Service: Orthopedics;  Laterality: Left;   APPENDECTOMY     AV FISTULA PLACEMENT Right 01/08/2015   Procedure: ARTERIOVENOUS (AV) FISTULA CREATION;  Surgeon: Lonni GORMAN Blade, MD;  Location: [facility] OR;  Service: Vascular;  Laterality: Right;   CESAREAN SECTION  2001; 1999; 1994   DILATION AND CURETTAGE OF UTERUS     EYE SURGERY     FOOT SURGERY Right 01/2022   Had infection, cleaned out   INCISION AND DRAINAGE OF WOUND Right 01/27/2008   serious staph infection   PARS PLANA VITRECTOMY Bilateral 01/27/2008   related to DM   TONSILLECTOMY AND ADENOIDECTOMY     Family History  Family History  Problem Relation Age of Onset   Hypertension Mother    Myelodysplastic syndrome Father    Hypertension Father    Pancreatic  cancer Other    Social History  reports that she has never smoked. She has never used smokeless tobacco. She reports that she does not currently use alcohol. She reports that she does not use drugs. Allergies  Allergies  Allergen Reactions   Bactrim Nausea And Vomiting   Byetta 10 Mcg Pen [Exenatide] Nausea And Vomiting   Hctz [Hydrochlorothiazide ] Other (See Comments) and Itching    dizziness dizziness   Spironolactone Other (See Comments)    Dizziness and nausea/vomiting   Sulfa Antibiotics Nausea And Vomiting   Sulfacetamide Sodium Nausea And Vomiting   Sulfamethoxazole-Trimethoprim Nausea And Vomiting   Home medications Prior to Admission medications   Medication Sig Start Date End Date Taking? Authorizing Provider   cyclobenzaprine (FLEXERIL) 5 MG tablet Take 5 mg by mouth daily as needed for muscle spasms. 09/15/23  Yes [provider]  FLUoxetine  (PROZAC ) 20 MG tablet Take 20 mg by mouth daily. 09/15/23  Yes [provider]  gabapentin  (NEURONTIN ) 300 MG capsule Take 1 capsule (300 mg total) by mouth at bedtime. 06/11/22 01/26/24 Yes Rush Nest, MD  levothyroxine (SYNTHROID) 50 MCG tablet Take 50 mcg by mouth daily. 09/15/23  Yes [provider]  rOPINIRole  (REQUIP ) 1 MG tablet TAKE 1 TABLET BY MOUTH DAILY AS NEEDED. Patient taking differently: Take 1 mg by mouth 4 (four) times a week. Give 1 tablet (1mg ) by mouth at bedtime on Mon, Weds, Fri, and Sun. 10/06/22  Yes Rush Nest, MD  sevelamer  (RENAGEL ) 800 MG tablet Take 1,600 mg by mouth 3 (three) times daily. 09/15/23  Yes [provider]  FLUoxetine  (PROZAC ) 40 MG capsule Take 40 mg by mouth at bedtime. Patient not taking: Reported on 09/16/2023    [provider]  insulin  glargine, 1 Unit Dial , (TOUJEO  SOLOSTAR) 300 UNIT/ML Solostar Pen Inject 10 Units into the skin daily. Patient not taking: Reported on 09/16/2023 03/10/21   [provider]  labetalol  (NORMODYNE ) 200 MG tablet Take 200 mg by mouth 2 (two) times daily. Patient not taking: Reported on 09/16/2023 04/22/21   [provider]  leptospermum manuka honey (MEDIHONEY) PSTE paste Apply 1 application. topically daily. Patient not taking: Reported on 09/16/2023 04/20/21   Vann, Jessica U, DO  metolazone (ZAROXOLYN) 5 MG tablet Take 5 mg by mouth daily. Patient not taking: Reported on 09/16/2023 04/22/21   [provider]  midodrine  (PROAMATINE ) 10 MG tablet Take 10 mg by mouth 3 (three) times daily. Patient not taking: Reported on 09/16/2023 09/14/23   [provider]  mupirocin  ointment (BACTROBAN ) 2 % Place 1 application. into the nose 2 (two) times daily. Patient not taking: Reported on 09/16/2023 04/20/21   Vann, Jessica  U, DO  Semaglutide, 1 MG/DOSE, (OZEMPIC, 1 MG/DOSE,) 4 MG/3ML SOPN Inject 1 mg into the skin every Wednesday. Patient not taking: Reported on 09/16/2023 02/20/21   [provider]     Vitals:   09/16/23 1600 09/16/23 1605 09/16/23 1610 09/16/23 1615  BP: (!) 105/34   (!) 114/36  Pulse: 66 67 67 66  Resp: 19 (!) 24 (!) 24 (!) 24  Temp: 98.5 F (36.9 C)     TempSrc: Axillary     SpO2: 100% 100% 100% 100%  Weight:      Height:       Exam Gen on vent, sedated No rash, cyanosis or gangrene Sclera anicteric, throat w/ ETT No jvd or bruits Chest clear anterior/ lateral RRR no MRG Abd soft ntnd no mass or ascites +bs  GU normal MS no joint effusions or deformity Ext no UE or LE edema, no wounds or ulcers Neuro is on vent, sedated    RUA AVF+bruit   Home bp meds: Midodrine  10mg  tid    [facility]  B400   69.5kg   2K bath   AVF   Heparin  2500 Last HD 8/09 at OP unit, post wt 73.5kg Last HD inpatient 8/19 prob at [facility] (in [place])    Assessment/ Plan: S/P PEA cardiac arrest: w/ status myoclonus, possible anoxic brain injury.  Aspiration PNA/ acute resp failure: on vent per CCM ESRD: on HD TTS. Last HD Tuesday. Hold HD until family meeting this afternoon.  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   Myer Fret  MD [facility] 09/17/2023, 1:32 PM  Recent Labs  Lab 09/16/23 0522 09/16/23 0535 09/16/23 0618 09/16/23 1105  HGB 9.2* 10.2* 9.5* 9.1*  ALBUMIN 2.7*  --   --   --   CALCIUM  10.0  --   --   --   CREATININE 3.91* 3.90*  --  4.08*  K 3.9 4.0 3.8  --    Inpatient medications:  Chlorhexidine  Gluconate Cloth  6 each Topical Daily   docusate  100 mg Per Tube BID   fentaNYL  (SUBLIMAZE ) injection  50 mcg Intravenous Once   insulin  aspart  0-15 Units Subcutaneous Q4H   midazolam   2 mg Intravenous Once   pantoprazole  (PROTONIX ) IV  40 mg Intravenous Q24H    polyethylene glycol  17 g Per Tube Daily    sodium chloride  Stopped (09/16/23 1545)   ampicillin -sulbactam (UNASYN ) IV 200 mL/hr at 09/16/23 1600   fentaNYL  infusion INTRAVENOUS Stopped (09/16/23 1446)   heparin  1,200 Units/hr (09/16/23 1600)   magnesium  sulfate     propofol  (DIPRIVAN ) infusion Stopped (09/16/23 1500)   [START ON 09/18/2023] acetaminophen  **OR** [START ON 09/18/2023] acetaminophen  (TYLENOL ) oral liquid 160 mg/5 mL **OR** [START ON 09/18/2023] acetaminophen , busPIRone  **OR** busPIRone , fentaNYL , magnesium  sulfate, ondansetron  (ZOFRAN ) IV

## 2023-09-16 NOTE — Consult Note (Addendum)
 WOC Nurse Consult Note: patient with recent hospitalization [facility] with unstageable PI sacrum at that time; presenting as Stage 3 PI this admit  Reason for Consult: sacral wound  Wound type:  1. Stage 3 Pressure Injury sacrum appears red moist  2.  Full thickness L stump (has history of B transmetatarsal amputations D/T PVD). Dry necrotic brown  3 Partial thickness chest pink moist   Pressure Injury POA: Yes, sacrum  Measurement: per nursing flowsheet  Wound bed:as above  Drainage (amount, consistency, odor) see nursing flowsheet  Periwound: Dressing procedure/placement/frequency:  Cleanse sacrum with Vashe wound clenaser Soila [phone]) do not rinse and allow to air dry.  Apply silver hydrofiber (Lawson [id number]) cut to fit wound bed daily and secure with silicone foam.  Cleanse L stump with Vashe wound cleanser Soila [phone]) do not rinse and allow to air dry. Apply Xeroform gauze to dry dark tissue daily and secure with silicone foam or Kerlix roll gauze whichever is preferred.  Cleanse chest wound with NS, apply Xeroform gauze Soila [phone]) to wound bed daily and secure with silicone foam.   POC discussed with bedside nurse. WOC team will not follow. Re-consult if further needs arise.   Thank you,    Sharonna Vinje MSN, RN-BC, CWOCN

## 2023-09-16 NOTE — ED Notes (Signed)
 Phlebotomy at bedside.

## 2023-09-16 NOTE — Progress Notes (Signed)
 Pt transported on ventilator to CT and back without complication.

## 2023-09-16 NOTE — Progress Notes (Signed)
 eLink Physician-Brief Progress Note Patient Name: Josphine Laffey Sandy DOB: 1965/08/05 MRN: [id number]   Date of Service  09/16/2023  HPI/Events of Note  Blood sugar dropped to 48 mg / dl despite D5 % NS gtt.  eICU Interventions  D 10 % gtt substituted to run at 75 ml / hour.        Hanadi Stanly U Alyana Kreiter 09/16/2023, 11:31 PM

## 2023-09-16 NOTE — ED Notes (Signed)
 Pt has no sites for blood collection   attempt x2 at

## 2023-09-16 NOTE — Progress Notes (Signed)
 RT transported patient with RN to CT and back without complications. RT will continue to monitor.

## 2023-09-16 NOTE — Plan of Care (Signed)
  Problem: Metabolic: Goal: Ability to maintain appropriate glucose levels will improve Outcome: Progressing   Problem: Tissue Perfusion: Goal: Adequacy of tissue perfusion will improve Outcome: Progressing   

## 2023-09-16 NOTE — Progress Notes (Signed)
 PHARMACY - ANTICOAGULATION CONSULT NOTE  Pharmacy Consult for Heparin   Indication: pulmonary embolus  Allergies  Allergen Reactions   Bactrim Nausea And Vomiting   Byetta 10 Mcg Pen [Exenatide] Nausea And Vomiting   Hctz [Hydrochlorothiazide ] Other (See Comments) and Itching    dizziness dizziness   Spironolactone Other (See Comments)    Dizziness and nausea/vomiting   Sulfa Antibiotics Nausea And Vomiting   Sulfacetamide Sodium Nausea And Vomiting   Sulfamethoxazole-Trimethoprim Nausea And Vomiting    Patient Measurements: Height: 5' 7 (170.2 cm) Weight: 79.9 kg (176 lb 2.4 oz) IBW/kg (Calculated) : 61.6 HEPARIN  DW (KG): 77.9  Vital Signs: Temp: 97.5 F (36.4 C) (08/21 1110) Temp Source: Axillary (08/21 1110) BP: 121/110 (08/21 1315) Pulse Rate: 67 (08/21 1315)  Labs: Recent Labs    09/16/23 0522 09/16/23 0535 09/16/23 0618 09/16/23 0702 09/16/23 1105  HGB 9.2* 10.2* 9.5*  --  9.1*  HCT 30.9* 30.0* 28.0*  --  28.7*  PLT 176  --   --   --  185  CREATININE 3.91* 3.90*  --   --  4.08*  TROPONINIHS 50*  --   --  71*  --     Estimated Creatinine Clearance: 15.9 mL/min (A) (by C-G formula based on SCr of 4.08 mg/dL (H)).   Medical History: Past Medical History:  Diagnosis Date   Anxiety state, unspecified    Background diabetic retinopathy(362.01)    Bulimia    Calculus of kidney    Cellulitis and abscess of foot 01/24/2015   left foot   Chronic inflammatory demyelinating polyneuritis (HCC)    Chronic kidney disease, stage IV (severe) (HCC)    DM2 (diabetes mellitus, type 2) (HCC)    Dysthymic disorder    Esophageal reflux    Essential hypertension, benign    Heart murmur    been told very slight, never given her any problems   History of blood transfusion X 6-7; 2010 - present (03/29/2014)   related to OR; kidney issues   HLD (hyperlipidemia)    HDL goal >50, LDL goal <100   Iron deficiency anemia    suppose to get procrit  injections q 3 wks;  usually don't do it (03/29/2014)   Irritable bowel syndrome    Left heart failure (HCC)    Primary pulmonary hypertension (HCC)    not seen at CATH  (35mm Hg), pt not aware of this   RLS (restless legs syndrome)     Medications:  Infusions:   sodium chloride  Stopped (09/16/23 1218)   ampicillin -sulbactam (UNASYN ) IV     fentaNYL  infusion INTRAVENOUS 150 mcg/hr (09/16/23 1300)   heparin  1,200 Units/hr (09/16/23 1351)   magnesium  sulfate     propofol  (DIPRIVAN ) infusion 10 mcg/kg/min (09/16/23 1300)    Assessment: Patient is a 58 y.o. F admitted for out of hospital respiratory arrest. Pharmacy is consulted to initiate heparin  due to suspicion of PE.  Hgb 9.1 PLT 185. No signs of bleeding. No blood thinners at home.   Goal of Therapy:  Heparin  level 0.3-0.7 units/ml Monitor platelets by anticoagulation protocol: Yes   Plan:  Give 5000 units bolus x 1 Initiate heparin  gtt at 1200 units/hr Draw 8h level  Holly [name] 09/16/2023,1:29 PM

## 2023-09-16 NOTE — ED Notes (Signed)
 IV team at bedside

## 2023-09-16 NOTE — Consult Note (Signed)
 NEUROLOGY CONSULT NOTE   Date of service: September 16, 2023 Patient Name: Holly [name] MRN:  [id number] DOB:  17-Nov-1965 Chief Complaint: s/p cardiac arrest Requesting Provider: Harold Scholz, MD  History of Present Illness  Holly [name] is a 58 y.o. female with hx of end-stage renal disease on hemodialysis, diabetes type 2, hypertension, hyperlipidemia was brought into the emergency department from skilled care nursing facility status post PEA cardiac arrest. Neurology was consulted for evaluation of generalized myoclonic jerking.   Patient was examined in CT. She is intubated and sedated, with propofol  paused for exam. No myoclonic jerking was witnessed on exam. She doe snot open eyes, does not follow commands, pupils are 3 and non reactive, no cough or gag reflex, no withdraw in any extremity to noxious stimuli.   LTM EEG was placed earlier. Of note, EEG tech did endorse intermittent myoclonic jerking while this was being placed.     ROS   Unable to ascertain due to intubation and sedation.   Past History   Past Medical History:  Diagnosis Date   Anxiety state, unspecified    Background diabetic retinopathy(362.01)    Bulimia    Calculus of kidney    Cellulitis and abscess of foot 01/24/2015   left foot   Chronic inflammatory demyelinating polyneuritis (HCC)    Chronic kidney disease, stage IV (severe) (HCC)    DM2 (diabetes mellitus, type 2) (HCC)    Dysthymic disorder    Esophageal reflux    Essential hypertension, benign    Heart murmur    been told very slight, never given her any problems   History of blood transfusion X 6-7; 2010 - present (03/29/2014)   related to OR; kidney issues   HLD (hyperlipidemia)    HDL goal >50, LDL goal <100   Iron deficiency anemia    suppose to get procrit  injections q 3 wks; usually don't do it (03/29/2014)   Irritable bowel syndrome    Left heart failure (HCC)    Primary pulmonary hypertension (HCC)    not seen at CATH   (35mm Hg), pt not aware of this   RLS (restless legs syndrome)     Past Surgical History:  Procedure Laterality Date   AMPUTATION Left 01/26/2015   Procedure: POST TRANS MET ;  Surgeon: Jerona Harden GAILS, MD;  Location: [facility] OR;  Service: Orthopedics;  Laterality: Left;   APPENDECTOMY     AV FISTULA PLACEMENT Right 01/08/2015   Procedure: ARTERIOVENOUS (AV) FISTULA CREATION;  Surgeon: Lonni GORMAN Blade, MD;  Location: [facility] OR;  Service: Vascular;  Laterality: Right;   CESAREAN SECTION  2001; 1999; 1994   DILATION AND CURETTAGE OF UTERUS     EYE SURGERY     FOOT SURGERY Right 01/2022   Had infection, cleaned out   INCISION AND DRAINAGE OF WOUND Right 01/27/2008   serious staph infection   PARS PLANA VITRECTOMY Bilateral 01/27/2008   related to DM   TONSILLECTOMY AND ADENOIDECTOMY      Family History: Family History  Problem Relation Age of Onset   Hypertension Mother    Myelodysplastic syndrome Father    Hypertension Father    Pancreatic cancer Other     Social History  reports that she has never smoked. She has never used smokeless tobacco. She reports that she does not currently use alcohol. She reports that she does not use drugs.  Allergies  Allergen Reactions   Bactrim Nausea And Vomiting   Byetta 10 Mcg Pen [  Exenatide] Nausea And Vomiting   Hctz [Hydrochlorothiazide ] Other (See Comments) and Itching    dizziness dizziness   Spironolactone Other (See Comments)    Dizziness and nausea/vomiting   Sulfa Antibiotics Nausea And Vomiting   Sulfacetamide Sodium Nausea And Vomiting   Sulfamethoxazole-Trimethoprim Nausea And Vomiting    Medications   Current Facility-Administered Medications:    0.9 %  sodium chloride  infusion, 250 mL, Intravenous, Continuous, Chand, Valinda, MD, Stopped at 09/16/23 1545   [START ON 09/18/2023] acetaminophen  (TYLENOL ) tablet 650 mg, 650 mg, Oral, Q4H PRN **OR** [START ON 09/18/2023] acetaminophen  (TYLENOL ) 160 MG/5ML solution 650 mg, 650  mg, Per Tube, Q4H PRN **OR** [START ON 09/18/2023] acetaminophen  (TYLENOL ) suppository 650 mg, 650 mg, Rectal, Q4H PRN, Harold Valinda, MD   Ampicillin -Sulbactam (UNASYN ) 3 g in sodium chloride  0.9 % 100 mL IVPB, 3 g, Intravenous, Q12H, Chand, Valinda, MD, Last Rate: 200 mL/hr at 09/16/23 1600, Infusion Verify at 09/16/23 1600   busPIRone  (BUSPAR ) tablet 30 mg, 30 mg, Oral, Q8H PRN **OR** busPIRone  (BUSPAR ) tablet 30 mg, 30 mg, Per Tube, Q8H PRN, Chand, Sudham, MD   Chlorhexidine  Gluconate Cloth 2 % PADS 6 each, 6 each, Topical, Daily, Harold Valinda, MD, 6 each at 09/16/23 1050   docusate (COLACE) 50 MG/5ML liquid 100 mg, 100 mg, Per Tube, BID, Harold Valinda, MD, 100 mg at 09/16/23 1159   fentaNYL  (SUBLIMAZE ) bolus via infusion 50-100 mcg, 50-100 mcg, Intravenous, Q15 min PRN, Harold Valinda, MD   fentaNYL  (SUBLIMAZE ) injection 50 mcg, 50 mcg, Intravenous, Once, Chand, Sudham, MD   fentaNYL  in NS (71mcg/ml) infusion-PREMIX, 0-400 mcg/hr, Intravenous, Continuous, Pollina, Lonni PARAS, MD, Stopped at 09/16/23 1446   heparin  ADULT infusion 100 units/mL (25000 units/250mL), 1,200 Units/hr, Intravenous, Continuous, Chand, Valinda, MD, Last Rate: 12 mL/hr at 09/16/23 1600, 1,200 Units/hr at 09/16/23 1600   insulin  aspart (novoLOG ) injection 0-15 Units, 0-15 Units, Subcutaneous, Q4H, Chand, Sudham, MD, 2 Units at 09/16/23 1543   magnesium  sulfate IVPB 2 g 50 mL, 2 g, Intravenous, Once PRN, Harold Valinda, MD   midazolam  (VERSED ) injection 2 mg, 2 mg, Intravenous, Once, Chand, Sudham, MD   ondansetron  (ZOFRAN ) injection 4 mg, 4 mg, Intravenous, Q6H PRN, Harold Valinda, MD   pantoprazole  (PROTONIX ) injection 40 mg, 40 mg, Intravenous, Q24H, Amoako, Prince, MD, 40 mg at 09/16/23 1159   polyethylene glycol (MIRALAX  / GLYCOLAX ) packet 17 g, 17 g, Per Tube, Daily, Chand, Sudham, MD, 17 g at 09/16/23 1159   propofol  (DIPRIVAN ) 1000 MG/100ML infusion, 0-50 mcg/kg/min, Intravenous, Continuous, Harold Valinda,  MD, Stopped at 09/16/23 1500  Vitals   Vitals:   09/16/23 1600 09/16/23 1605 09/16/23 1610 09/16/23 1615  BP: (!) 105/34     Pulse: 66 67 67 66  Resp: 19 (!) 24 (!) 24 (!) 24  Temp:      TempSrc:      SpO2: 100% 100% 100% 100%  Weight:      Height:        Body mass index is 27.59 kg/m.   Physical Exam   Constitutional: Appears chronically and acutely ill  Cardiovascular: SR, hypotensive Respiratory: mechanically ventilated on full support Skin: bilateral toe amputations  Neurologic Examination   Patient is intubated and sedated. Fentanyl  is running at 150, Propofol  at 50, just paused prior to exam.  No myoclonic jerking witnessed during exam.   Patient does not open eyes to voice or noxious stimuli.  She does not track examiner with passive eye opening.  Pupils are 3 and  non-reactive No gag reflex, no cough reflex.  Tongue appears midline, ETT in place. No withdraw in any extremity to noxious stimuli.   Labs/Imaging/Neurodiagnostic studies   CBC:  Recent Labs  Lab 10-06-23 0522 October 06, 2023 0535 2023-10-06 0618 10/06/23 1105  WBC 5.6  --   --  10.7*  NEUTROABS 3.9  --   --   --   HGB 9.2*   < > 9.5* 9.1*  HCT 30.9*   < > 28.0* 28.7*  MCV 103.7*  --   --  99.0  PLT 176  --   --  185   < > = values in this interval not displayed.   Basic Metabolic Panel:  Lab Results  Component Value Date   NA 127 (L) 2023-10-06   K 3.8 10-06-23   CO2 22 October 06, 2023   GLUCOSE 256 (H) 10/06/23   BUN 27 (H) 10/06/2023   CREATININE 4.08 (H) 10/06/2023   CALCIUM  10.0 2023/10/06   GFRNONAA 12 (L) Oct 06, 2023   GFRAA 18 (L) 10/06/2019   Lipid Panel:  Lab Results  Component Value Date   LDLCALC 104 (H) 04/19/2021   HgbA1c:  Lab Results  Component Value Date   HGBA1C 4.6 (L) 10-06-23   Urine Drug Screen:     Component Value Date/Time   LABOPIA POSITIVE (A) 01/28/2009 0950   COCAINSCRNUR NONE DETECTED 01/28/2009 0950   LABBENZ NONE DETECTED 01/28/2009 0950    AMPHETMU NONE DETECTED 01/28/2009 0950   THCU NONE DETECTED 01/28/2009 0950   LABBARB  01/28/2009 0950    NONE DETECTED        DRUG SCREEN FOR MEDICAL PURPOSES ONLY.  IF CONFIRMATION IS NEEDED FOR ANY PURPOSE, NOTIFY LAB WITHIN 5 DAYS.        LOWEST DETECTABLE LIMITS FOR URINE DRUG SCREEN Drug Class       Cutoff (ng/mL) Amphetamine      1000 Barbiturate      200 Benzodiazepine   200 Tricyclics       300 Opiates          300 Cocaine          300 THC              50    Alcohol Level     Component Value Date/Time   [facility]  05/01/2008 0545    <5        LOWEST DETECTABLE LIMIT FOR SERUM ALCOHOL IS 5 mg/dL FOR MEDICAL PURPOSES ONLY   INR  Lab Results  Component Value Date   INR 1.4 (H) 11/06/2018   APTT  Lab Results  Component Value Date   APTT 28 05/04/2008   AED levels: No results found for: PHENYTOIN, ZONISAMIDE, LAMOTRIGINE, LEVETIRACETA  CT Head without contrast(Personally reviewed): No acute intracranial abnormality. Mild generalized cerebral volume loss.  Neurodiagnostics LTM EEG:  Placed 06-Oct-2023, pending read 8/22 am   ASSESSMENT   Holly [name] is a 58 y.o. female with hx of end-stage renal disease on hemodialysis, diabetes type 2, hypertension, hyperlipidemia was brought into the emergency department from skilled care nursing facility status post PEA cardiac arrest. Neurology was consulted for evaluation of generalized myoclonic jerking.   Her neurological exam is very poor. She does not open eyes or withdraw, she has nonreactive pupils, no cough or gag. Her sedation was just paused right before the exam, so it is possible that some of this was due to sedation. However, given her complete story and presentation, it is likely that this is  the result of her arrest and indicates a poor prognosis going forward. MRI in coming days will be helpful for prognostication purposes. LTM EEG will be formally read tomorrow AM. She was loaded with Keppra  and started on  maintenance dosing.   RECOMMENDATIONS   - continue LTM EEG - continue Propofol , titrated to movement cessation.  This is a symptomatic therapy, however, and if it becomes dangerous due to hemodynamics or other issues, would not chase that aggressively. - Continue Keppra  1 g twice daily - Depacon  2 g x 1 followed by 500 twice daily - recommend MRI in 2-3 days for prognostication ______________________________________________________________________    Signed, Rocky JAYSON Likes, NP [facility]    I have seen the patient reviewed the above note.  She has early myoclonus coupled with perseveration EEG early after cardiac arrest.  Her current exam is very concerning with only pupillary reflex.  My suspicion is that she will not survive this, but I think it is worth continuing to monitor for now.  Will repeat a CT in the morning, if it demonstrates significant ischemic injury, then I think that will be enough to give prognosis.  This patient is critically ill and at significant risk of neurological worsening, death and care requires constant monitoring of vital signs, hemodynamics,respiratory and cardiac monitoring, neurological assessment, discussion with family, other specialists and medical decision making of high complexity. I spent 35 minutes of neurocritical care time  in the care of  this patient. This was time spent independent of any time provided by nurse practitioner or PA.  Aisha Seals, MD [facility]   If 7pm- 7am, please page neurology on call as listed in AMION. 09/16/2023  6:06 PM

## 2023-09-16 NOTE — Progress Notes (Signed)
 Pharmacy Antibiotic Note  Holly [name] is a 58 y.o. female admitted on 09/16/2023 with pneumonia.  Pharmacy has been consulted for unasyn  dosing.  Plan: Unasyn  3G IV q12 hours Monitor clinical progression and cultures  Height: 5' 7 (170.2 cm) Weight: 63.5 kg (140 lb) IBW/kg (Calculated) : 61.6  Temp (24hrs), Avg:95.8 F (35.4 C), Min:95.8 F (35.4 C), Max:95.8 F (35.4 C)  Recent Labs  Lab 09/16/23 0522 09/16/23 0535  WBC 5.6  --   CREATININE 3.91* 3.90*    Estimated Creatinine Clearance: 14.9 mL/min (A) (by C-G formula based on SCr of 3.9 mg/dL (H)).    Allergies  Allergen Reactions   Bactrim Nausea And Vomiting   Byetta 10 Mcg Pen [Exenatide] Nausea And Vomiting   Hctz [Hydrochlorothiazide ] Other (See Comments) and Itching    dizziness dizziness   Spironolactone Other (See Comments)    Dizziness and nausea/vomiting   Sulfa Antibiotics Nausea And Vomiting   Sulfacetamide Sodium Nausea And Vomiting   Sulfamethoxazole-Trimethoprim Nausea And Vomiting    Thank you for allowing pharmacy to be a part of this patient's care.  Koren CROME Dmoni Fortson 09/16/2023 8:38 AM

## 2023-09-16 NOTE — Progress Notes (Signed)
 LTM EEG hooked up and running - no initial skin breakdown - push button tested - [facility] monitoring.CT leads used

## 2023-09-16 NOTE — Procedures (Addendum)
 Patient Name: Holly [name]  MRN: [id number]  Epilepsy Attending: Arlin MALVA Krebs  Referring Physician/Provider: Harold Scholz, MD  Date: 09/16/2023 Duration: 22.45 mins  Patient history: 58yo F s/p cardiac arrest. EEG to evaluate for seizure  Level of alertness: comatose  AEDs during EEG study: None  Technical aspects: This EEG study was done with scalp electrodes positioned according to the 10-20 International system of electrode placement. Electrical activity was reviewed with band pass filter of 1-70Hz , sensitivity of 7 uV/mm, display speed of 28mm/sec with a 60Hz  notched filter applied as appropriate. EEG data were recorded continuously and digitally stored.  Video monitoring was available and reviewed as appropriate.  Description: Patient was noted to have episodes of brief sudden eye opening with whole body jerking lasting 3-5 seconds lasting every 30 seconds to 1 minute.  Concomitant EEG showed generalized polyspikes consistent with myoclonic seizures.  In between seizures EEG showed generalized background suppression. Hyperventilation and photic stimulation were not performed.     ABNORMALITY - Myoclonic seizure, generalized - Background suppression, generalized  IMPRESSION: Patient was noted to have myoclonic seizures every 30 seconds to 1 minute.  Additionally there was evidence of severe to profound diffuse encephalopathy.  In the setting of cardiac arrest, this EEG pattern is concerning for anoxic/hypoxic brain injury.  Dr. Harold was notified.  Laneta Guerin O Corneilus Heggie

## 2023-09-16 NOTE — ED Notes (Signed)
 CCMD called by this RN

## 2023-09-16 NOTE — ED Triage Notes (Signed)
 PT arrived via [facility], PT was from [facility], PT was calling out to nursing home staff then fell and hit her face and head, PT went unresponsive and CPR started 3 EPI was given en route 1g calcium  and 50meq of bicarb as well as 1L bolus. PT arrived to ED with 7.0 ETT and being bagged by EMS VSS. PT has a IO in the left tibia.

## 2023-09-16 NOTE — H&P (Signed)
 NAME:  Holly [name], MRN:  [id number], DOB:  [birth year], LOS: 0 ADMISSION DATE:  09/16/2023, CONSULTATION DATE: 09/16/2023 REFERRING MD:  Haze Millman , CHIEF COMPLAINT: Status postcardiac arrest  History of Present Illness:  58 year old female with end-stage renal disease on hemodialysis, diabetes type 2, hypertension, hyperlipidemia was brought into the emergency department from skilled care nursing facility status post PEA cardiac arrest.  Per nursing report patient called out to nursing home staff due to increasing shortness of breath, they attempted to put oxygen on her but she collapsed falling face forward, noted to be in asystole, CPR was initiated, she received 3 rounds of epinephrine, given calcium  and bicarbonate.  She was intubated and was brought into the emergency department  During my evaluation patient is having generalized myoclonic jerking  Pertinent  Medical History   Past Medical History:  Diagnosis Date   Anxiety state, unspecified    Background diabetic retinopathy(362.01)    Bulimia    Calculus of kidney    Cellulitis and abscess of foot 01/24/2015   left foot   Chronic inflammatory demyelinating polyneuritis (HCC)    Chronic kidney disease, stage IV (severe) (HCC)    DM2 (diabetes mellitus, type 2) (HCC)    Dysthymic disorder    Esophageal reflux    Essential hypertension, benign    Heart murmur    been told very slight, never given her any problems   History of blood transfusion X 6-7; 2010 - present (03/29/2014)   related to OR; kidney issues   HLD (hyperlipidemia)    HDL goal >50, LDL goal <100   Iron deficiency anemia    suppose to get procrit  injections q 3 wks; usually don't do it (03/29/2014)   Irritable bowel syndrome    Left heart failure (HCC)    Primary pulmonary hypertension (HCC)    not seen at CATH  (35mm Hg), pt not aware of this   RLS (restless legs syndrome)      Significant Hospital Events: Including procedures, antibiotic  start and stop dates in addition to other pertinent events     Interim History / Subjective:  As above  Objective    Blood pressure (!) 119/59, pulse 72, temperature (!) 95.8 F (35.4 C), temperature source Rectal, resp. rate 18, height 5' 7 (1.702 m), weight 63.5 kg, last menstrual period 07/17/2011, SpO2 100%.    Vent Mode: PRVC FiO2 (%):  [100 %] 100 % Set Rate:  [16 bmp] 16 bmp Vt Set:  [470 mL] 470 mL PEEP:  [5 cmH20] 5 cmH20 Plateau Pressure:  [19 cmH20] 19 cmH20  No intake or output data in the 24 hours ending 09/16/23 0733 Filed Weights   09/16/23 0509  Weight: 63.5 kg    Examination: General: Crtitically ill-appearing female, orally intubated HEENT: Hard neck collar in place, [place]/AT, eyes anicteric.  ETT and GT in place Neuro: Eyes closed, does not open, not following commands, pupils mid dilated, nonreactive to light, absent cough and gag, myoclonic jerking noted Chest: Bilateral crackles right more than left, no wheezes or rhonchi Heart: Regular rate and rhythm, no murmurs or gallops Abdomen: Soft, nondistended, bowel sounds present  Labs and images reviewed  Patient Lines/Drains/Airways Status     Active Line/Drains/Airways     Name Placement date Placement time Site Days   Peripheral IV 09/16/23 20 G 1.25 Left Antecubital 09/16/23  0518  Antecubital  less than 1   Peripheral IV 09/16/23 22 G 2.5 Anterior;Left;Upper Arm 09/16/23  0810  Arm  less than 1   Fistula / Graft Right Forearm Arteriovenous fistula 01/08/15  --  Forearm  3173   NG/OG Vented/Dual Lumen 16 Fr. 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 Anoxic encephalopathy Acute respiratory failure with hypoxia Probable aspiration pneumonia End-stage renal disease on hemodialysis Hypervolemic hyponatremia Diabetes type 2 Anemia of renal disease  Continue supportive care Continue TTM protocol Will obtain echocardiogram EKG did not show any acute ST or T wave changes Her prior echocardiogram showed normal EF in 2023 Patient is having status myoclonus Continue propofol  Will get continuous video EEG Minimize sedation with RASS goal -1 Patient will need MRI brain 48-72 hours postarrest, for prognostication Continue lung protective ventilation VAP prevention bundle in place PAD protocol with propofol  and fentanyl  for TTM protocol if needed Obtain respiratory culture Started on IV Unasyn  There is a concern of PE as she complained of sudden shortness of breath and then collapsed, will start therapeutic heparin  infusion CT angio PE protocol is pending Will consult nephrology for hemodialysis needs Closely monitor and supplement electrolytes Continue sliding scale insulin  with CBG goal 140-180 Monitor H&H  Guarded prognosis  Best Practice (right click and Reselect all SmartList Selections daily)   Diet/type: NPO DVT prophylaxis systemic heparin  Pressure ulcer(s): Please see nursing notes GI prophylaxis: H2B Lines: N/A Foley:  N/A Code Status:  full code Last date of multidisciplinary goals of care discussion [8/21: Patient's husband and daughter both were updated]  Labs   CBC: Recent Labs  Lab 09/16/23 0522 09/16/23 0535 09/16/23 0618  WBC 5.6  --   --   NEUTROABS 3.9  --   --   HGB 9.2* 10.2* 9.5*  HCT 30.9* 30.0* 28.0*  MCV 103.7*  --   --   PLT 176  --   --     Basic Metabolic Panel: Recent Labs  Lab 09/16/23 0522 09/16/23 0535 09/16/23 0618  NA 128* 127* 127*  K 3.9  4.0 3.8  CL 91* 95*  --   CO2 22  --   --   GLUCOSE 254* 256*  --   BUN 23* 27*  --   CREATININE 3.91* 3.90*  --   CALCIUM  10.0  --   --   MG 2.3  --   --    GFR: Estimated Creatinine Clearance: 14.9 mL/min (A) (by C-G formula based on SCr of 3.9 mg/dL (H)). Recent Labs  Lab 09/16/23 0522  WBC 5.6    Liver Function Tests: Recent Labs  Lab 09/16/23 0522  AST 36  ALT 13  ALKPHOS 140*  BILITOT 1.0  PROT 5.4*  ALBUMIN 2.7*   No results for input(s): LIPASE, AMYLASE in the last 168 hours. No results for input(s): AMMONIA in the last 168 hours.  ABG    Component Value Date/Time   PHART 7.410 09/16/2023 0618   PCO2ART 38.1 09/16/2023 0618   PO2ART 108 09/16/2023 0618  HCO3 24.6 09/16/2023 0618   TCO2 26 09/16/2023 0618   O2SAT 99 09/16/2023 0618     Coagulation Profile: No results for input(s): INR, PROTIME in the last 168 hours.  Cardiac Enzymes: No results for input(s): CKTOTAL, CKMB, CKMBINDEX, TROPONINI in the last 168 hours.  HbA1C: Hgb A1c MFr Bld  Date/Time Value Ref Range Status  04/16/2021 06:15 AM 6.3 (H) 4.8 - 5.6 % Final    Comment:    (NOTE) Pre diabetes:          5.7%-6.4%  Diabetes:              >6.4%  Glycemic control for   <7.0% adults with diabetes   11/06/2018 03:28 AM 8.4 (H) 4.8 - 5.6 % Final    Comment:    (NOTE) Pre diabetes:          5.7%-6.4% Diabetes:              >6.4% Glycemic control for   <7.0% adults with diabetes     CBG: No results for input(s): GLUCAP in the last 168 hours.  Review of Systems:   Unable to obtain as patient is intubated  Past Medical History:  She,  has a past medical history of Anxiety state, unspecified, Background diabetic retinopathy(362.01), Bulimia, Calculus of kidney, Cellulitis and abscess of foot (01/24/2015), Chronic inflammatory demyelinating polyneuritis (HCC), Chronic kidney disease, stage IV (severe) (HCC), DM2 (diabetes mellitus, type 2) (HCC), Dysthymic  disorder, Esophageal reflux, Essential hypertension, benign, Heart murmur, History of blood transfusion (X 6-7; 2010 - present (03/29/2014)), HLD (hyperlipidemia), Iron deficiency anemia, Irritable bowel syndrome, Left heart failure (HCC), Primary pulmonary hypertension (HCC), and RLS (restless legs syndrome).   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   Procedure: POST TRANS MET ;  Surgeon: Jerona Harden GAILS, MD;  Location: [facility] OR;  Service: Orthopedics;  Laterality: Left;   APPENDECTOMY     AV FISTULA PLACEMENT Right 01/08/2015   Procedure: ARTERIOVENOUS (AV) FISTULA CREATION;  Surgeon: Lonni GORMAN Blade, MD;  Location: [facility] OR;  Service: Vascular;  Laterality: Right;   CESAREAN SECTION  2001; 1999; 1994   DILATION AND CURETTAGE OF UTERUS     EYE SURGERY     FOOT SURGERY Right 01/2022   Had infection, cleaned out   INCISION AND DRAINAGE OF WOUND Right 01/27/2008   serious staph infection   PARS PLANA VITRECTOMY Bilateral 01/27/2008   related to DM   TONSILLECTOMY AND ADENOIDECTOMY       Social History:   reports that she has never smoked. She has never used smokeless tobacco. She reports that she does not currently use alcohol. She reports that she does not use drugs.   Family History:  Her family history includes Hypertension in her father and mother; Myelodysplastic syndrome in her father; Pancreatic cancer in an other family member.   Allergies Allergies  Allergen Reactions   Bactrim Nausea And Vomiting   Byetta 10 Mcg Pen [Exenatide] Nausea And Vomiting   Hctz [Hydrochlorothiazide ] Other (See Comments) and Itching    dizziness dizziness   Spironolactone Other (See Comments)    Dizziness and nausea/vomiting   Sulfa Antibiotics Nausea And Vomiting   Sulfacetamide Sodium Nausea And Vomiting   Sulfamethoxazole-Trimethoprim Nausea And Vomiting     Home Medications  Prior to Admission medications   Medication Sig  Start Date End Date Taking? Authorizing Provider  cyclobenzaprine (FLEXERIL) 5 MG tablet Take 5 mg by  mouth at bedtime as needed. 09/15/23  Yes [provider]  FLUoxetine  (PROZAC ) 20 MG tablet Take 20 mg by mouth daily. 09/15/23  Yes [provider]  gabapentin  (NEURONTIN ) 300 MG capsule Take 1 capsule (300 mg total) by mouth at bedtime. 06/11/22 01/26/24 Yes Rush Nest, MD  levothyroxine (SYNTHROID) 50 MCG tablet Take 50 mcg by mouth daily. 09/15/23  Yes [provider]  midodrine  (PROAMATINE ) 10 MG tablet Take 10 mg by mouth 3 (three) times daily. 09/14/23  Yes [provider]  rOPINIRole  (REQUIP ) 1 MG tablet TAKE 1 TABLET BY MOUTH DAILY AS NEEDED. Patient taking differently: Take 1 mg by mouth 4 (four) times a week. Give 1 tablet (1mg ) by mouth at bedtime on Mon, Weds, Fri, and Sun. 10/06/22  Yes Rush Nest, MD  sevelamer  (RENAGEL ) 800 MG tablet Take 1,600 mg by mouth 3 (three) times daily. 09/15/23  Yes [provider]  FLUoxetine  (PROZAC ) 40 MG capsule Take 40 mg by mouth at bedtime. Patient not taking: Reported on 09/16/2023    [provider]  insulin  glargine, 1 Unit Dial , (TOUJEO  SOLOSTAR) 300 UNIT/ML Solostar Pen Inject 10 Units into the skin daily. Patient not taking: Reported on 09/16/2023 03/10/21   [provider]  labetalol  (NORMODYNE ) 200 MG tablet Take 200 mg by mouth 2 (two) times daily. Patient not taking: Reported on 09/16/2023 04/22/21   [provider]  leptospermum manuka honey (MEDIHONEY) PSTE paste Apply 1 application. topically daily. Patient not taking: Reported on 09/16/2023 04/20/21   Vann, Jessica U, DO  metolazone (ZAROXOLYN) 5 MG tablet Take 5 mg by mouth daily. Patient not taking: Reported on 09/16/2023 04/22/21   [provider]  mupirocin  ointment (BACTROBAN ) 2 % Place 1 application. into the nose 2 (two) times daily. Patient not taking: Reported on 09/16/2023 04/20/21   Vann, Jessica U,  DO  Semaglutide, 1 MG/DOSE, (OZEMPIC, 1 MG/DOSE,) 4 MG/3ML SOPN Inject 1 mg into the skin every Wednesday. Patient not taking: Reported on 09/16/2023 02/20/21   [provider]     Critical care time:      The patient is critically ill due to status post PEA cardiac arrest/acute respiratory failure with hypoxia.  Critical care was necessary to treat or prevent imminent or life-threatening deterioration.  Critical care was time spent personally by me on the following activities: development of treatment plan with patient and/or surrogate as well as nursing, discussions with consultants, evaluation of patient's response to treatment, examination of patient, obtaining history from patient or surrogate, ordering and performing treatments and interventions, ordering and review of laboratory studies, ordering and review of radiographic studies, pulse oximetry, re-evaluation of patient's condition and participation in multidisciplinary rounds.   During this encounter critical care time was devoted to patient care services described in this note for 46 minutes.     Valinda Novas, MD  Pulmonary Critical Care See Amion for pager If no response to pager, please call [phone] until 7pm After 7pm, Please call E-link [phone]

## 2023-09-16 NOTE — ED Provider Notes (Signed)
 [place] EMERGENCY DEPARTMENT AT [facility] Provider Note   CSN: [id number] Arrival date & time: 09/16/23  9541     Patient presents with: Post Arrest   Holly [name] is a 58 y.o. female.   Patient brought to the emergency department by ambulance from skilled nursing facility.  Patient reportedly called out to nursing staff stating that she was having trouble breathing.  Nursing staff found her standing next to her bed.  They attempted to put her on oxygen at which point she collapsed, falling face forward.  CPR was initiated.  EMS report administration of 3 rounds of epi, calcium , bicarb (patient is a dialysis patient).  Patient was intubated by EMS with a 7 oh endotracheal tube.       Prior to Admission medications   Medication Sig Start Date End Date Taking? Authorizing Provider  cyclobenzaprine (FLEXERIL) 5 MG tablet Take 5 mg by mouth at bedtime as needed. 09/15/23  Yes [provider]  FLUoxetine  (PROZAC ) 20 MG tablet Take 20 mg by mouth daily. 09/15/23  Yes [provider]  gabapentin  (NEURONTIN ) 300 MG capsule Take 1 capsule (300 mg total) by mouth at bedtime. 06/11/22 01/26/24 Yes Rush Nest, MD  levothyroxine (SYNTHROID) 50 MCG tablet Take 50 mcg by mouth daily. 09/15/23  Yes [provider]  midodrine  (PROAMATINE ) 10 MG tablet Take 10 mg by mouth 3 (three) times daily. 09/14/23  Yes [provider]  rOPINIRole  (REQUIP ) 1 MG tablet TAKE 1 TABLET BY MOUTH DAILY AS NEEDED. Patient taking differently: Take 1 mg by mouth 4 (four) times a week. Give 1 tablet (1mg ) by mouth at bedtime on Mon, Weds, Fri, and Sun. 10/06/22  Yes Rush Nest, MD  sevelamer  (RENAGEL ) 800 MG tablet Take 1,600 mg by mouth 3 (three) times daily. 09/15/23  Yes [provider]  FLUoxetine  (PROZAC ) 40 MG capsule Take 40 mg by mouth at bedtime. Patient not taking: Reported on 09/16/2023    [provider]  insulin  glargine, 1 Unit Dial ,  (TOUJEO  SOLOSTAR) 300 UNIT/ML Solostar Pen Inject 10 Units into the skin daily. Patient not taking: Reported on 09/16/2023 03/10/21   [provider]  labetalol  (NORMODYNE ) 200 MG tablet Take 200 mg by mouth 2 (two) times daily. Patient not taking: Reported on 09/16/2023 04/22/21   [provider]  leptospermum manuka honey (MEDIHONEY) PSTE paste Apply 1 application. topically daily. Patient not taking: Reported on 09/16/2023 04/20/21   Vann, Jessica U, DO  metolazone (ZAROXOLYN) 5 MG tablet Take 5 mg by mouth daily. Patient not taking: Reported on 09/16/2023 04/22/21   [provider]  mupirocin  ointment (BACTROBAN ) 2 % Place 1 application. into the nose 2 (two) times daily. Patient not taking: Reported on 09/16/2023 04/20/21   Vann, Jessica U, DO  Semaglutide, 1 MG/DOSE, (OZEMPIC, 1 MG/DOSE,) 4 MG/3ML SOPN Inject 1 mg into the skin every Wednesday. Patient not taking: Reported on 09/16/2023 02/20/21   [provider]    Allergies: Bactrim, Byetta 10 mcg pen [exenatide], Hctz [hydrochlorothiazide ], Spironolactone, Sulfa antibiotics, Sulfacetamide sodium, and Sulfamethoxazole-trimethoprim    Review of Systems  Updated Vital Signs BP (!) 119/59   Pulse 72   Temp (!) 95.8 F (35.4 C) (Rectal)   Resp 18   Ht 5' 7 (1.702 m)   Wt 63.5 kg   LMP 07/17/2011   SpO2 100%   BMI 21.93 kg/m   Physical Exam Vitals and nursing note reviewed.  Constitutional:      Appearance: She  is well-developed.  HENT:     Head: Normocephalic. Contusion present.     Comments: Dried blood at the nares    Mouth/Throat:     Mouth: Mucous membranes are moist.  Eyes:     General: Vision grossly intact. Gaze aligned appropriately.     Extraocular Movements: Extraocular movements intact.     Conjunctiva/sclera: Conjunctivae normal.  Cardiovascular:     Rate and Rhythm: Normal rate and regular rhythm.     Pulses: Normal pulses.     Heart sounds: Normal heart sounds, S1 normal and S2  normal. No murmur heard.    No friction rub. No gallop.  Pulmonary:     Effort: Pulmonary effort is normal. No respiratory distress.     Breath sounds: Normal breath sounds.  Abdominal:     General: Bowel sounds are normal.     Palpations: Abdomen is soft.     Tenderness: There is no abdominal tenderness. There is no guarding or rebound.     Hernia: No hernia is present.  Musculoskeletal:        General: No swelling.     Cervical back: Full passive range of motion without pain, normal range of motion and neck supple. No spinous process tenderness or muscular tenderness. Normal range of motion.     Right lower leg: No edema.     Left lower leg: No edema.  Skin:    General: Skin is warm and dry.     Findings: No ecchymosis, erythema, rash or wound.  Neurological:     General: No focal deficit present.     Mental Status: She is unresponsive.     (all labs ordered are listed, but only abnormal results are displayed) Labs Reviewed  CBC WITH DIFFERENTIAL/PLATELET - Abnormal; Notable for the following components:      Result Value   RBC 2.98 (*)    Hemoglobin 9.2 (*)    HCT 30.9 (*)    MCV 103.7 (*)    MCHC 29.8 (*)    RDW 18.1 (*)    All other components within normal limits  COMPREHENSIVE METABOLIC PANEL WITH GFR - Abnormal; Notable for the following components:   Sodium 128 (*)    Chloride 91 (*)    Glucose, Bld 254 (*)    BUN 23 (*)    Creatinine, Ser 3.91 (*)    Total Protein 5.4 (*)    Albumin 2.7 (*)    Alkaline Phosphatase 140 (*)    GFR, Estimated 13 (*)    All other components within normal limits  I-STAT CHEM 8, ED - Abnormal; Notable for the following components:   Sodium 127 (*)    Chloride 95 (*)    BUN 27 (*)    Creatinine, Ser 3.90 (*)    Glucose, Bld 256 (*)    Calcium , Ion 1.07 (*)    Hemoglobin 10.2 (*)    HCT 30.0 (*)    All other components within normal limits  I-STAT ARTERIAL BLOOD GAS, ED - Abnormal; Notable for the following components:    Sodium 127 (*)    HCT 28.0 (*)    Hemoglobin 9.5 (*)    All other components within normal limits  TROPONIN I (HIGH SENSITIVITY) - Abnormal; Notable for the following components:   Troponin I (High Sensitivity) 50 (*)    All other components within normal limits  CULTURE, BLOOD (ROUTINE X 2)  CULTURE, BLOOD (ROUTINE X 2)  MAGNESIUM   BLOOD GAS, ARTERIAL  TROPONIN  I (HIGH SENSITIVITY)    EKG: EKG Interpretation Date/Time:  Thursday September 16 2023 05:12:13 EDT Ventricular Rate:  71 PR Interval:  187 QRS Duration:  110 QT Interval:  469 QTC Calculation: 510 R Axis:   121  Text Interpretation: Sinus rhythm Anterior infarct, old Nonspecific repol abnormality, lateral leads No significant change since last tracing Confirmed by Haze Lonni PARAS [phone]) on 09/16/2023 5:17:00 AM  Radiology: CT HEAD WO CONTRAST ( ) Result Date: 09/16/2023 EXAM: CT HEAD WITHOUT CONTRAST 09/16/2023 06:38:38 AM TECHNIQUE: CT of the head was performed without the administration of intravenous contrast. Automated exposure control, iterative reconstruction, and/or weight based adjustment of the mA/kV was utilized to reduce the radiation dose to as low as reasonably achievable. COMPARISON: CT of the head dated 08/28/2023. CLINICAL HISTORY: Head trauma, moderate-severe. Fall. FINDINGS: BRAIN AND VENTRICLES: No acute hemorrhage. Gray-white differentiation is preserved. No hydrocephalus. No extra-axial collection. No mass effect or midline shift. Mild generalized cerebral volume loss. ORBITS: No acute abnormality. SINUSES: Mucosal disease within the frontal and ethmoid paranasal sinuses. The mastoid air cells are clear. SOFT TISSUES AND SKULL: No acute soft tissue abnormality. No skull fracture. IMPRESSION: 1. No acute intracranial abnormality. 2. Mild generalized cerebral volume loss. 3. Mucosal disease within the frontal and paranasal sinuses. Mastoid air cells are clear. Electronically signed by: Evalene Coho MD  09/16/2023 06:54 AM EDT RP Workstation: [id number]   CT CERVICAL SPINE WO CONTRAST Result Date: 09/16/2023 CLINICAL DATA:  Fall.  Blunt facial trauma. EXAM: CT MAXILLOFACIAL WITHOUT CONTRAST CT CERVICAL SPINE WITHOUT CONTRAST TECHNIQUE: Multidetector CT imaging of the maxillofacial structures was performed. Multiplanar CT image reconstructions were also generated. A small metallic BB was placed on the right temple in order to reliably differentiate right from left. Multidetector CT imaging of the cervical spine was performed without intravenous contrast. Multiplanar CT image reconstructions were also generated. RADIATION DOSE REDUCTION: This exam was performed according to the departmental dose-optimization program which includes automated exposure control, adjustment of the mA and/or kV according to patient size and/or use of iterative reconstruction technique. COMPARISON:  None Available. FINDINGS: CT MAXILLOFACIAL FINDINGS Osseous: No fracture or mandibular dislocation. No destructive process. Orbits: Visualized portions of the globes and intraorbital fat are unremarkable. Sinuses: Chronic mucosal thickening noted both maxillary sinuses. Trace air-fluid level seen in the frontal sinuses and ethmoid air cells. Chronic opacification of the right sphenoid sinus with mucosal thickening and air-fluid level in the left sphenoid sinus. No mastoid effusion. Soft tissues: Unremarkable. Limited intracranial: See report for head CT performed at the same time and dictated separately. CT CERVICAL FINDINGS Alignment: Normal. Skull base and vertebrae: Rotation of C1 on 2 secondary to head position. No evidence for an acute fracture of the cervical spine. No findings to suggest traumatic subluxation. Soft tissues and spinal canal: Debris is identified in the posterior nasopharynx in this patient with endotracheal and NG tubes in place. There is diffuse edema in the soft tissues of the neck and upper chest. Disc levels:  Preserved throughout. Facets are well aligned bilaterally. Upper chest: Layering pleural effusions noted bilaterally with patchy ground-glass airspace disease in the left apex. Other: None. IMPRESSION: 1. No evidence for an acute fracture of the cervical spine. 2. No evidence for an acute facial bone fracture. 3. Chronic paranasal sinus disease with air-fluid levels in the frontal sinuses, ethmoid air cells, and left sphenoid sinus. Fluid in the paranasal sinuses consider nonspecific in the setting of intubation, but acute on chronic sinusitis could have this appearance.  4. Layering pleural effusions bilaterally with patchy ground-glass airspace disease in the left apex. 5. Diffuse edema in the soft tissues of the neck and upper chest. Electronically Signed   By: Camellia Candle M.D.   On: 09/16/2023 06:53   CT MAXILLOFACIAL WO CONTRAST Result Date: 09/16/2023 CLINICAL DATA:  Fall.  Blunt facial trauma. EXAM: CT MAXILLOFACIAL WITHOUT CONTRAST CT CERVICAL SPINE WITHOUT CONTRAST TECHNIQUE: Multidetector CT imaging of the maxillofacial structures was performed. Multiplanar CT image reconstructions were also generated. A small metallic BB was placed on the right temple in order to reliably differentiate right from left. Multidetector CT imaging of the cervical spine was performed without intravenous contrast. Multiplanar CT image reconstructions were also generated. RADIATION DOSE REDUCTION: This exam was performed according to the departmental dose-optimization program which includes automated exposure control, adjustment of the mA and/or kV according to patient size and/or use of iterative reconstruction technique. COMPARISON:  None Available. FINDINGS: CT MAXILLOFACIAL FINDINGS Osseous: No fracture or mandibular dislocation. No destructive process. Orbits: Visualized portions of the globes and intraorbital fat are unremarkable. Sinuses: Chronic mucosal thickening noted both maxillary sinuses. Trace air-fluid level  seen in the frontal sinuses and ethmoid air cells. Chronic opacification of the right sphenoid sinus with mucosal thickening and air-fluid level in the left sphenoid sinus. No mastoid effusion. Soft tissues: Unremarkable. Limited intracranial: See report for head CT performed at the same time and dictated separately. CT CERVICAL FINDINGS Alignment: Normal. Skull base and vertebrae: Rotation of C1 on 2 secondary to head position. No evidence for an acute fracture of the cervical spine. No findings to suggest traumatic subluxation. Soft tissues and spinal canal: Debris is identified in the posterior nasopharynx in this patient with endotracheal and NG tubes in place. There is diffuse edema in the soft tissues of the neck and upper chest. Disc levels: Preserved throughout. Facets are well aligned bilaterally. Upper chest: Layering pleural effusions noted bilaterally with patchy ground-glass airspace disease in the left apex. Other: None. IMPRESSION: 1. No evidence for an acute fracture of the cervical spine. 2. No evidence for an acute facial bone fracture. 3. Chronic paranasal sinus disease with air-fluid levels in the frontal sinuses, ethmoid air cells, and left sphenoid sinus. Fluid in the paranasal sinuses consider nonspecific in the setting of intubation, but acute on chronic sinusitis could have this appearance. 4. Layering pleural effusions bilaterally with patchy ground-glass airspace disease in the left apex. 5. Diffuse edema in the soft tissues of the neck and upper chest. Electronically Signed   By: Camellia Candle M.D.   On: 09/16/2023 06:53   DG Abd Portable 1V Result Date: 09/16/2023 CLINICAL DATA:  NG tube placement. EXAM: PORTABLE ABDOMEN - 1 VIEW COMPARISON:  03/19/2008 FINDINGS: NG tube tip is positioned in the proximal stomach. Side port of the NG tube is in the region of the GE junction. Prominent gaseous distention of the stomach evident. IMPRESSION: NG tube tip is in the proximal stomach with  side port in the region of the GE junction. NG tube could be advanced approximately 8-9 cm to position the side port below the GE junction. Repeat abdominal x-ray after repositioning recommended. Electronically Signed   By: Camellia Candle M.D.   On: 09/16/2023 05:59   DG Chest Port 1 View Result Date: 09/16/2023 CLINICAL DATA:  Ventilator dependence. EXAM: PORTABLE CHEST 1 VIEW COMPARISON:  09/09/2023 FINDINGS: Endotracheal tube tip is 5.4 cm above the base of the carina. The NG tube passes into the stomach although the  distal tip position is not included on the film. Proximal side port of the NG tube is positioned in the region of the GE junction. The cardio pericardial silhouette is enlarged. Retrocardiac left base collapse/consolidation is progressive in the interval. The airspace disease seen previously at the right base has improved in the interval. Telemetry leads overlie the chest. IMPRESSION: 1. Endotracheal tube tip is 5.4 cm above the base of the carina. 2. Proximal side port of the NG tube is positioned in the region of the GE junction. See report for abdomen x-ray performed at the same time. 3. Progressive retrocardiac left base collapse/consolidation. Electronically Signed   By: Camellia Candle M.D.   On: 09/16/2023 05:58     Procedures   Medications Ordered in the ED  fentaNYL  in NS 250mL (10mcg/ml) infusion-PREMIX (350 mcg/hr Intravenous Rate/Dose Change 09/16/23 0653)  cefTRIAXone  (ROCEPHIN ) 1 g in sodium chloride  0.9 % 100 mL IVPB (has no administration in time range)  azithromycin  (ZITHROMAX ) 500 mg in sodium chloride  0.9 % 250 mL IVPB (has no administration in time range)                                    Medical Decision Making Amount and/or Complexity of Data Reviewed External Data Reviewed: labs, radiology and ECG. Labs: ordered. Decision-making details documented in ED Course. Radiology: ordered and independent interpretation performed. Decision-making details  documented in ED Course. ECG/medicine tests: ordered and independent interpretation performed. Decision-making details documented in ED Course.   Differential diagnosis considered includes, but not limited to: MI; stroke; intracranial bleed; arrhythmia; electrolyte abnormality  Patient with history of end-stage renal disease, on dialysis, hypertension, heart failure, pulmonary hypertension, diabetes, presents from nursing home after cardiac arrest.  Patient reported that she felt short of breath and then collapsed onto the ground.  CPR was initiated, ACLS was performed by EMS.  She was intubated in the field.  Patient arrives with spontaneous circulation.  She is moving all extremities, breathing above the vent.  CT head, maxillofacial bones, cervical spine negative.  Chest x-ray with left lung base collapse/consolidation.  Rocephin  and Zithromax  administered for pneumonia coverage.  CRITICAL CARE Performed by: Lonni JINNY Seats   Total critical care time: 30 minutes  Critical care time was exclusive of separately billable procedures and treating other patients.  Critical care was necessary to treat or prevent imminent or life-threatening deterioration.  Critical care was time spent personally by me on the following activities: development of treatment plan with patient and/or surrogate as well as nursing, discussions with consultants, evaluation of patient's response to treatment, examination of patient, obtaining history from patient or surrogate, ordering and performing treatments and interventions, ordering and review of laboratory studies, ordering and review of radiographic studies, pulse oximetry and re-evaluation of patient's condition.      Final diagnoses:  Cardiac arrest [facility])    ED Discharge Orders     None          Seats Lonni JINNY, MD 09/16/23 [phone]

## 2023-09-17 ENCOUNTER — Inpatient Hospital Stay (HOSPITAL_COMMUNITY)

## 2023-09-17 DIAGNOSIS — R569 Unspecified convulsions: Secondary | ICD-10-CM | POA: Diagnosis not present

## 2023-09-17 DIAGNOSIS — G931 Anoxic brain damage, not elsewhere classified: Secondary | ICD-10-CM

## 2023-09-17 DIAGNOSIS — I469 Cardiac arrest, cause unspecified: Secondary | ICD-10-CM | POA: Diagnosis not present

## 2023-09-17 DIAGNOSIS — G40419 Other generalized epilepsy and epileptic syndromes, intractable, without status epilepticus: Secondary | ICD-10-CM | POA: Diagnosis not present

## 2023-09-17 DIAGNOSIS — J9601 Acute respiratory failure with hypoxia: Secondary | ICD-10-CM | POA: Diagnosis not present

## 2023-09-17 LAB — BASIC METABOLIC PANEL WITH GFR
Anion gap: 16 — ABNORMAL HIGH (ref 5–15)
BUN: 29 mg/dL — ABNORMAL HIGH (ref 6–20)
CO2: 20 mmol/L — ABNORMAL LOW (ref 22–32)
Calcium: 9.5 mg/dL (ref 8.9–10.3)
Chloride: 95 mmol/L — ABNORMAL LOW (ref 98–111)
Creatinine, Ser: 4.33 mg/dL — ABNORMAL HIGH (ref 0.44–1.00)
GFR, Estimated: 11 mL/min — ABNORMAL LOW (ref >60)
Glucose, Bld: 146 mg/dL — ABNORMAL HIGH (ref 70–99)
Potassium: 3.9 mmol/L (ref 3.5–5.1)
Sodium: 131 mmol/L — ABNORMAL LOW (ref 135–145)

## 2023-09-17 LAB — GLUCOSE, CAPILLARY
Glucose-Capillary: 106 mg/dL — ABNORMAL HIGH (ref 70–99)
Glucose-Capillary: 111 mg/dL — ABNORMAL HIGH (ref 70–99)
Glucose-Capillary: 124 mg/dL — ABNORMAL HIGH (ref 70–99)
Glucose-Capillary: 148 mg/dL — ABNORMAL HIGH (ref 70–99)
Glucose-Capillary: 152 mg/dL — ABNORMAL HIGH (ref 70–99)
Glucose-Capillary: 152 mg/dL — ABNORMAL HIGH (ref 70–99)
Glucose-Capillary: 176 mg/dL — ABNORMAL HIGH (ref 70–99)

## 2023-09-17 LAB — CBC
HCT: 31.8 % — ABNORMAL LOW (ref 36.0–46.0)
Hemoglobin: 10.1 g/dL — ABNORMAL LOW (ref 12.0–15.0)
MCH: 31.2 pg (ref 26.0–34.0)
MCHC: 31.8 g/dL (ref 30.0–36.0)
MCV: 98.1 fL (ref 80.0–100.0)
Platelets: 208 K/uL (ref 150–400)
RBC: 3.24 MIL/uL — ABNORMAL LOW (ref 3.87–5.11)
RDW: 18 % — ABNORMAL HIGH (ref 11.5–15.5)
WBC: 14.4 K/uL — ABNORMAL HIGH (ref 4.0–10.5)
nRBC: 0 % (ref 0.0–0.2)

## 2023-09-17 LAB — TRIGLYCERIDES: Triglycerides: 112 mg/dL (ref <150)

## 2023-09-17 LAB — MAGNESIUM: Magnesium: 2.1 mg/dL (ref 1.7–2.4)

## 2023-09-17 LAB — PHOSPHORUS: Phosphorus: 3.2 mg/dL (ref 2.5–4.6)

## 2023-09-17 MED ORDER — LEVETIRACETAM (KEPPRA) 500 MG/5 ML ADULT IV PUSH
500.0000 mg | Freq: Two times a day (BID) | INTRAVENOUS | Status: DC
  Administered 2023-09-17: 500 mg via INTRAVENOUS
  Filled 2023-09-17: qty 5

## 2023-09-17 MED ORDER — HEPARIN SODIUM (PORCINE) 1000 UNIT/ML DIALYSIS
1500.0000 [IU] | INTRAMUSCULAR | Status: AC | PRN
  Administered 2023-09-18: 1500 [IU] via INTRAVENOUS_CENTRAL
  Filled 2023-09-17: qty 2

## 2023-09-17 MED ORDER — PROPOFOL 1000 MG/100ML IV EMUL
20.0000 ug/kg/min | INTRAVENOUS | Status: DC
  Administered 2023-09-17 – 2023-09-18 (×4): 20 ug/kg/min via INTRAVENOUS
  Filled 2023-09-17: qty 100
  Filled 2023-09-17: qty 200

## 2023-09-17 MED ORDER — VITAL HP 1.0 CAL PO LIQD: 1000.0000 mL | ORAL | Status: DC

## 2023-09-17 MED ORDER — HEPARIN SODIUM (PORCINE) 1000 UNIT/ML DIALYSIS
2000.0000 [IU] | Freq: Once | INTRAMUSCULAR | Status: AC
  Administered 2023-09-18: 2000 [IU] via INTRAVENOUS_CENTRAL
  Filled 2023-09-17 (×2): qty 2

## 2023-09-17 MED ORDER — OSMOLITE 1.5 CAL PO LIQD
1000.0000 mL | ORAL | Status: DC
  Administered 2023-09-17 – 2023-09-19 (×2): 1000 mL
  Filled 2023-09-17 (×2): qty 1000

## 2023-09-17 NOTE — Progress Notes (Signed)
 Patient is post-arrest with imaging suggestive of some brain injury likley significant .Neurology has reviewed findings and indicates that the degree of injury is concerning . Given the poor neurologic prognosis and minimal chance of meaningful recovery to her baseline, , I believe a Goals of Care  discussion with the family/surrogates is warranted at this time.  This conversation should focus on aligning medical interventions with the patient's values and preferences, while setting clear and realistic expectations about the likelihood of neurologic recovery, which remains extremely limited.  - Consult Palliative   Drue Grady Grow MD 09/17/2023, 10:49 AM

## 2023-09-17 NOTE — Plan of Care (Signed)
  Problem: Metabolic: Goal: Ability to maintain appropriate glucose levels will improve Outcome: Progressing   Problem: Nutritional: Goal: Maintenance of adequate nutrition will improve Outcome: Progressing   Problem: Tissue Perfusion: Goal: Adequacy of tissue perfusion will improve Outcome: Progressing

## 2023-09-17 NOTE — TOC Initial Note (Signed)
 Transition of Care [facility]) - Initial/Assessment Note    Patient Details  Name: Holly [name] MRN: [id number] Date of Birth: 1965-12-14  [facility]) CM/SW Contact:    Lauraine FORBES Saa, LCSWA Phone Number: 09/17/2023, 10:27 AM  Clinical Narrative:                  10:28 AM Per chart review, patient is from [facility] SNF. SNF admissions confirmed patient was STR at SNF for a few weeks due to readmissions and is able to return upon discharge with approved insurance authorization. Patient has a PCP and insurance. Patient has HH history with [facility] and DME (manual wheelchair, BSC, oxygen) history with [facility]. [facility]'s preferred [facility] [address]. CSW will continue to follow and be available to assist.  Expected Discharge Plan: Skilled Nursing Facility Barriers to Discharge: Continued Medical Work up   Patient Goals and CMS Choice            Expected Discharge Plan and Services In-house Referral: Clinical Social Work     Living arrangements for the past 2 months: Skilled Nursing Facility                                      Prior Living Arrangements/Services Living arrangements for the past 2 months: Skilled Nursing Facility Lives with:: Facility Resident Patient language and need for interpreter reviewed:: Yes        Need for Family Participation in Patient Care: Yes (Comment) Care giver support system in place?: Yes (comment)   Criminal Activity/Legal Involvement Pertinent to Current Situation/Hospitalization: No - Comment as needed  Activities of Daily Living      Permission Sought/Granted Permission sought to share information with : Family Supports, Oceanographer granted to share information with : No (Contact information on chart)  Share Information with NAME: Leonel Diaz  Permission granted to share info w AGENCY: SNF STR  Permission granted to share info w Relationship: Spouse  Permission  granted to share info w Contact Information: [phone]  Emotional Assessment   Attitude/Demeanor/Rapport: Intubated (Following Commands or Not Following Commands), Unable to Assess Affect (typically observed): Unable to Assess   Alcohol / Substance Use: Not Applicable Psych Involvement: No (comment)  Admission diagnosis:  Cardiac arrest [facility]) [I46.9] Patient Active Problem List   Diagnosis Date Noted   Cardiac arrest (HCC) 09/16/2023   Atrial fibrillation with RVR (HCC) 04/24/2021   ESRD on dialysis (HCC) 04/24/2021   Atrial fibrillation, new onset (HCC) 04/18/2021   Acute urinary retention 04/15/2021   Deficiency anemia 11/15/2018   Leukopenia 11/08/2018   History of transmetatarsal amputation of left foot (HCC)    Fever 11/06/2018   UTI (urinary tract infection) 01/30/2015   Acute renal failure superimposed on stage 4 chronic kidney disease (HCC) 01/28/2015   Foot abscess, left 01/27/2015   Type 2 diabetes mellitus with chronic kidney disease, with long-term current use of insulin  (HCC)    Cellulitis of left foot 01/24/2015   Thrombocytopenia (HCC) 01/24/2015   Dehydration, moderate 01/24/2015   CKD (chronic kidney disease), stage V (HCC) 01/24/2015   Type 2 diabetes mellitus with left diabetic foot ulcer (HCC) 12/24/2014   Type 2 diabetes mellitus with right diabetic foot ulcer (HCC) 12/24/2014   Type II diabetes mellitus with neurological manifestations (HCC) 12/24/2014   Acute renal failure superimposed on stage 3 chronic kidney disease (HCC) 03/29/2014  Neurogenic bladder/suspected 03/29/2014   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   Hypertension associated with diabetes (HCC) 03/29/2014   Menorrhagia 07/19/2011   Tricuspid regurgitation 10/30/2010   Anemia in chronic kidney disease 10/30/2010   Dysautonomia (HCC) 08/20/2010   Chronic diastolic heart failure (HCC) 07/10/2010   Diabetes mellitus type 2, uncontrolled 04/04/2007   Depression 09/08/2006   Peripheral  neuropathy (HCC) 09/08/2006   CHOLELITHIASIS 09/08/2006   PCP:  Pura Lenis, MD Pharmacy:   [phone] [place], [place] - [address] AT [facility] OF [address] [place] [address] Phone: [phone] Fax: [phone]     Social Drivers of Health (SDOH) Social History: SDOH Screenings   Food Insecurity: Low Risk  (09/06/2023)   Received from [facility]  Housing: Low Risk  (09/06/2023)   Received from [facility]  Transportation Needs: No Transportation Needs (09/06/2023)   Received from [facility]  Utilities: Low Risk  (09/06/2023)   Received from [facility]  Financial Resource Strain: Medium Risk (06/15/2023)   Received from [facility]  Physical Activity: Insufficiently Active (06/16/2023)   Received from [facility]  Social Connections: Socially Isolated (06/15/2023)   Received from [facility]  Stress: Stress Concern Present (07/12/2023)   Received from [facility]  Tobacco Use: Low Risk  (09/16/2023)   SDOH Interventions:     Readmission Risk Interventions     No data to display

## 2023-09-17 NOTE — Progress Notes (Signed)
 Pt was previously assessed by IV team and it was recommended that a CL be placed if further access was needed. Per MAR pt has been on Levophed  for greater that 24 hrs so CL placement would be needed. Unit RN advised and will reach out to MD.

## 2023-09-17 NOTE — Inpatient Diabetes Management (Signed)
 Inpatient Diabetes Program Recommendations  AACE/ADA: New Consensus Statement on Inpatient Glycemic Control Target Ranges:  Prepandial:   less than 140 mg/dL      Peak postprandial:   less than 180 mg/dL (1-2 hours)      Critically ill patients:  140 - 180 mg/dL    Latest Reference Range & Units 09/17/23 00:10 09/17/23 03:01 09/17/23 07:25  Glucose-Capillary 70 - 99 mg/dL 888 (H) 851 (H) 847 (H)    Latest Reference Range & Units 09/16/23 10:43 09/16/23 15:13 09/16/23 19:45 09/16/23 20:44 09/16/23 23:19  Glucose-Capillary 70 - 99 mg/dL 731 (H) 854 (H) 40 (LL) 81 48 (L)   Review of Glycemic Control  Diabetes history: DM2 Outpatient Diabetes medications: Toujeo  10 units daily (not taking), Ozempic 1 mg Qweek (not taking) Current orders for Inpatient glycemic control: Novolog  0-15 units Q4H; D10 @ 75 ml/hr  Inpatient Diabetes Program Recommendations:    Insulin :CBG down to 40 mg/dl at 80:54 (after getting Novolog  2 units for correction) and 48 mg/dl at 76:80.  Please decrease Novolog  correction to 0-9 units Q4H.  IV Fluids: If Novolog  correction is decreased, may need to re-evaluate if dextrose  is needed in IV fluids.  Thanks, Earnie Gainer, RN, MSN, CDCES Diabetes Coordinator Inpatient Diabetes Program [phone] (Team Pager from 8am to 5pm)

## 2023-09-17 NOTE — Progress Notes (Signed)
LTM maint complete - no skin breakdown under: FP2,F8  

## 2023-09-17 NOTE — Progress Notes (Signed)
 NEUROLOGY CONSULT FOLLOW UP NOTE   Date of service: September 17, 2023 Patient Name: Holly [name] MRN:  [id number] DOB:  [birth year]  Interval Hx/subjective   No significant changes.  Vitals   Vitals:   09/17/23 1015 09/17/23 1030 09/17/23 1042 09/17/23 1045  BP: (!) 106/46 (!) 100/46  (!) 101/43  Pulse: (!) 56 (!) 56 (!) 55 (!) 56  Resp: 18 18 18 18   Temp: (!) 97.3 F (36.3 C) (!) 97.3 F (36.3 C) (!) 97.3 F (36.3 C) (!) 97.3 F (36.3 C)  TempSrc:      SpO2: 100% 100% 100% 100%  Weight:      Height:         Body mass index is 27.38 kg/m.  Physical Exam   Intubated   Neurologic Examination    MS: Does not open eyes or follow commands CN: Pupils are reactive, corneal intact on the left, no response to doll's maneuver, no cough Motor: No response to noxious stimulation Sensory: As above  Medications  Current Facility-Administered Medications:    0.9 %  sodium chloride  infusion, 250 mL, Intravenous, Continuous, Harold, Sudham, MD   [START ON 09/18/2023] acetaminophen  (TYLENOL ) tablet 650 mg, 650 mg, Oral, Q4H PRN **OR** [START ON 09/18/2023] acetaminophen  (TYLENOL ) 160 MG/5ML solution 650 mg, 650 mg, Per Tube, Q4H PRN **OR** [START ON 09/18/2023] acetaminophen  (TYLENOL ) suppository 650 mg, 650 mg, Rectal, Q4H PRN, Harold Scholz, MD   Ampicillin -Sulbactam (UNASYN ) 3 g in sodium chloride  0.9 % 100 mL IVPB, 3 g, Intravenous, Q12H, Chand, Scholz, MD, Stopped at 09/17/23 0236   busPIRone  (BUSPAR ) tablet 30 mg, 30 mg, Oral, Q8H PRN **OR** busPIRone  (BUSPAR ) tablet 30 mg, 30 mg, Per Tube, Q8H PRN, Harold Scholz, MD   Chlorhexidine  Gluconate Cloth 2 % PADS 6 each, 6 each, Topical, Daily, Harold Scholz, MD, 6 each at 09/16/23 2100   Chlorhexidine  Gluconate Cloth 2 % PADS 6 each, 6 each, Topical, Q0600, Geralynn Charleston, MD   docusate (COLACE) 50 MG/5ML liquid 100 mg, 100 mg, Per Tube, BID, Harold Scholz, MD, 100 mg at 09/17/23 9066   enoxaparin  (LOVENOX ) injection 30 mg, 30 mg,  Subcutaneous, Q24H, Chand, Sudham, MD, 30 mg at 09/16/23 1800   feeding supplement (OSMOLITE 1.5 CAL) liquid 1,000 mL, 1,000 mL, Per Tube, Continuous, Alva, Rakesh V, MD   fentaNYL  (SUBLIMAZE ) bolus via infusion 50-100 mcg, 50-100 mcg, Intravenous, Q15 min PRN, Harold Scholz, MD   fentaNYL  in NS 250mL (61mcg/ml) infusion-PREMIX, 0-400 mcg/hr, Intravenous, Continuous, Pollina, Lonni PARAS, MD, Stopped at 09/16/23 1446   levETIRAcetam  (KEPPRA ) undiluted injection 1,000 mg, 1,000 mg, Intravenous, Q12H, Judithe Longs C, NP, 1,000 mg at 09/17/23 9742   magnesium  sulfate IVPB 2 g 50 mL, 2 g, Intravenous, Once PRN, Harold Scholz, MD   midazolam  (VERSED ) injection 2 mg, 2 mg, Intravenous, Once, Chand, Sudham, MD   norepinephrine  (LEVOPHED ) 4mg  in (0.016 mg/mL) premix infusion, 0-10 mcg/min, Intravenous, Titrated, Chand, Sudham, MD, Last Rate: 3.75 mL/hr at 09/17/23 1000, 1 mcg/min at 09/17/23 1000   ondansetron  (ZOFRAN ) injection 4 mg, 4 mg, Intravenous, Q6H PRN, Harold Scholz, MD   Oral care mouth rinse, 15 mL, Mouth Rinse, Q2H, Chand, Sudham, MD, 15 mL at 09/17/23 1129   Oral care mouth rinse, 15 mL, Mouth Rinse, PRN, Chand, Sudham, MD   pantoprazole  (PROTONIX ) injection 40 mg, 40 mg, Intravenous, Q24H, Amoako, Prince, MD, 40 mg at 09/17/23 1129   polyethylene glycol (MIRALAX  / GLYCOLAX ) packet 17 g, 17 g, Per Tube, Daily,  Harold Scholz, MD, 17 g at 09/16/23 1159   propofol  (DIPRIVAN ) 1000 MG/100ML infusion, 20 mcg/kg/min, Intravenous, Continuous, Jude Harden GAILS, MD, Last Rate: 9.52 mL/hr at 09/17/23 1129, 20 mcg/kg/min at 09/17/23 1129   valproate (DEPACON ) 500 mg in dextrose  5 % 50 mL IVPB, 500 mg, Intravenous, Q8H, Michaela Aisha SQUIBB, MD, Stopped at 09/17/23 [phone]  Labs and Diagnostic Imaging   Imaging(Personally reviewed): CT head-I do feel that the deep structures are indistinct with blurring of the gray-white junction   Assessment   Holly [name] is a 58 y.o. female with  anoxic brain injury.  As evidenced by early myoclonus, super refractory myoclonic status, early CT change, poor exam, I suspect that she has had a significant injury.  Especially in the setting of her comorbid medical conditions, I think that her overall prognosis is extremely poor at this point.  Recommendations  Discussions with family regarding goals of care Can continue using propofol  to treat myoclonus Continue Depakote and Keppra  Neurology will continue to follow ______________________________________________________________________  This patient is critically ill and at significant risk of neurological worsening, death and care requires constant monitoring of vital signs, hemodynamics,respiratory and cardiac monitoring, neurological assessment, discussion with family, other specialists and medical decision making of high complexity. I spent 45 minutes of neurocritical care time  in the care of  this patient. This was time spent independent of any time provided by nurse practitioner or PA.  Aisha Michaela, MD [facility]   If 7pm- 7am, please page neurology on call as listed in AMION. 09/17/2023  1:04 PM

## 2023-09-17 NOTE — Progress Notes (Signed)
 Initial Nutrition Assessment  DOCUMENTATION CODES:  Not applicable  INTERVENTION:  Initiate tube feeding via OGT: Osmolite 1.5 at 45 ml/h (1080 ml per day) Start at 25 and hold, once able, begin to advance by 10 mL every 8 hours to reach goal Prosource TF20 60 ml 1x/d Provides 1700 kcal, 88 gm protein, 823 ml free water daily  NUTRITION DIAGNOSIS:  Inadequate oral intake related to inability to eat as evidenced by NPO status.  GOAL:  Patient will meet greater than or equal to 90% of their needs  MONITOR:  Vent status, Labs, TF tolerance  REASON FOR ASSESSMENT:  Consult Enteral/tube feeding initiation and management (trickles)  ASSESSMENT:  Pt with hx of ESRD on HD, DM type 2, IBS, HTN, and HLD presented to ED from rehab facility s/p PEA cardiac arrest.  Noted recent admissions to [facility] and was followed by RD team at that time.   Patient is currently intubated on ventilator support several family members at bedside and currently waiting on one more to arrive to decide on GOC. Will defer official nutrition hx and physical exam until GOC are clarified.   Pt discussed during ICU rounds and with RN and MD. Pt with grim prognosis after MRI. PMT has been consulted. Pt with likely extensive brain damage. Consulted for TF, CBG were low after admission. Adjusted orders and placed recommendations in the event that feed are started.  MV: 8.6 L/min Temp (24hrs), Avg:97.9 F (36.6 C), Min:96.6 F (35.9 C), Max:99.3 F (37.4 C) MAP (cuff):  Propofol : 9.52 ml/hr (251 kcal/d)  Admit / Current weight: 79.3 kg    Intake/Output Summary (Last 24 hours) at 09/17/2023 1337 Last data filed at 09/17/2023 1000 Gross per 24 hour  Intake 2021.14 ml  Output --  Net 2021.14 ml  Net IO Since Admission: 2,375.5 mL [09/17/23 1337]  Drains/Lines: OGT 16 Fr. (Gastric) Right AV Fistula  IO line  Nutritionally Relevant Medications: Scheduled Meds:  docusate  100 mg Per Tube BID    VITAL HIGH PROTEIN  1,000 mL Per Tube Q24H   pantoprazole  IV  40 mg Intravenous Q24H   polyethylene glycol  17 g Per Tube Daily   Continuous Infusions:  sodium chloride      ampicillin -sulbactam (UNASYN ) IV Stopped (09/17/23 0236)   magnesium  sulfate     norepinephrine  (LEVOPHED ) Adult infusion 7 mcg/min (09/17/23 0700)   propofol  (DIPRIVAN ) infusion 20 mcg/kg/min (09/17/23 0847)   PRN Meds: magnesium  sulfate, ondansetron   Labs Reviewed: Sodium 131, chloride 95 BUN 29, creatinine 4.33 CBG ranges from 40-268 mg/dL over the last 24 hours HgbA1c 4.6%  NUTRITION - FOCUSED PHYSICAL EXAM: Defer to follow-up assessment  Diet Order:   Diet Order             Diet NPO time specified  Diet effective now                   EDUCATION NEEDS:  Not appropriate for education at this time  Skin:  Skin Assessment: Skin Integrity Issues: Stage 3 - sacrum  Last BM:  unsure  Height:  Ht Readings from Last 1 Encounters:  09/16/23 5' 7 (1.702 m)    Weight:  Wt Readings from Last 1 Encounters:  09/17/23 79.3 kg    Ideal Body Weight:  61.4 kg  BMI:  Body mass index is 27.38 kg/m.  Estimated Nutritional Needs:  Kcal:  1600-1800 kcal/d Protein:  80-95g/d Fluid:  1.8L/d    Vernell Lukes, RD, LDN, CNSC Registered  Dietitian II Please reach out via secure chat

## 2023-09-17 NOTE — Progress Notes (Signed)
 IV team consult placed to assess for peripheral IV access. Pt is a difficult stick.  IV team unable to place PIV, recommending central line.  Elink notified.

## 2023-09-17 NOTE — Progress Notes (Addendum)
 NAME:  Holly [name], MRN:  [id number], DOB:  February 18, 1965, LOS: 1 ADMISSION DATE:  09/16/2023, CONSULTATION DATE: 09/16/2023 REFERRING MD:  Haze Millman , CHIEF COMPLAINT: Status postcardiac arrest  History of Present Illness:  58 year old female with end-stage renal disease on hemodialysis, diabetes type 2, hypertension, hyperlipidemia was brought into the emergency department from skilled care nursing facility status post PEA cardiac arrest.  Per nursing report patient called out to nursing home staff due to increasing shortness of breath, they attempted to put oxygen on her but she collapsed falling face forward, noted to be in asystole, CPR was initiated, she received 3 rounds of epinephrine, given calcium  and bicarbonate.  She was intubated and was brought into the emergency department  During my evaluation patient is having generalized myoclonic jerking  Pertinent  Medical History   Past Medical History:  Diagnosis Date   Anxiety state, unspecified    Background diabetic retinopathy(362.01)    Bulimia    Calculus of kidney    Cellulitis and abscess of foot 01/24/2015   left foot   Chronic inflammatory demyelinating polyneuritis (HCC)    Chronic kidney disease, stage IV (severe) (HCC)    DM2 (diabetes mellitus, type 2) (HCC)    Dysthymic disorder    Esophageal reflux    Essential hypertension, benign    Heart murmur    been told very slight, never given her any problems   History of blood transfusion X 6-7; 2010 - present (03/29/2014)   related to OR; kidney issues   HLD (hyperlipidemia)    HDL goal >50, LDL goal <100   Iron deficiency anemia    suppose to get procrit  injections q 3 wks; usually don't do it (03/29/2014)   Irritable bowel syndrome    Left heart failure (HCC)    Primary pulmonary hypertension (HCC)    not seen at CATH  (35mm Hg), pt not aware of this   RLS (restless legs syndrome)      Significant Hospital Events: Including procedures, antibiotic  start and stop dates in addition to other pertinent events   8/21 myoclonic seizures postadmission >> LTM EEG, loaded with Keppra  and Depakote  Interim History / Subjective:  Critically ill, intubated Sedated on propofol  On LTM EEG On low-dose Levophed    Objective    Blood pressure (!) 101/43, pulse (!) 56, temperature (!) 97.3 F (36.3 C), resp. rate 18, height 5' 7 (1.702 m), weight 79.3 kg, last menstrual period 07/17/2011, SpO2 100%.    Vent Mode: PRVC FiO2 (%):  [40 %-60 %] 50 % Set Rate:  [18 bmp] 18 bmp Vt Set:  [490 mL] 490 mL PEEP:  [5 cmH20] 5 cmH20 Plateau Pressure:  [18 cmH20-22 cmH20] 22 cmH20   Intake/Output Summary (Last 24 hours) at 09/17/2023 1219 Last data filed at 09/17/2023 1000 Gross per 24 hour  Intake 2249.25 ml  Output --  Net 2249.25 ml   Filed Weights   09/16/23 1040 09/16/23 1315 09/17/23 0500  Weight: 79.9 kg 79.9 kg 79.3 kg    Examination: General: Crtitically ill-appearing female, orally intubated HEENT: Hard neck collar in place, [place]/AT, eyes anicteric.  ETT and GT in place Neuro: RASS -3, pupils 2 mm reactive to light, absent corneals and conjunctival's Chest: Bilateral ventilated breath sounds, no accessory muscle use Heart: Regular rate and rhythm, no murmurs or gallops Abdomen: Soft, nondistended, bowel sounds present  Repeat head CT 8/22 shows loss of gray-white differentiation CT angio chest negative for PE, cardiomegaly with bilateral effusions,  bilateral lower lobe consolidation consistent with aspiration  Labs show hyponatremia, mild leukocytosis   Resolved problem list   Assessment and Plan  Status post PEA cardiac arrest, likely hypoxia induced Generalized status myoclonus, due to anoxic brain injury Anoxic encephalopathy  EEG shows burst suppression with highly epileptiform myoclonic bursts, generalized  - Continue supportive care - TTM, avoid fevers - Continue propofol , Keppra  and Depakote - Poor prognosis for  neurologic recovery given by neurology    Acute respiratory failure with hypoxia Probable aspiration pneumonia  - Unasyn  -Continue full vent support   End-stage renal disease on hemodialysis  - Inform renal for HD Hypervolemic hyponatremia  - Monitor   Diabetes type 2  -SSI Anemia of renal disease  -Monitor  Guarded prognosis for neurologic recovery has been conveyed to family, await goals of care conversation  Best Practice (right click and Reselect all SmartList Selections daily)   Diet/type: NPO DVT prophylaxis systemic heparin  Pressure ulcer(s): Please see nursing notes GI prophylaxis: H2B Lines: N/A Foley:  N/A Code Status:  full code Last date of multidisciplinary goals of care discussion [8/21: Patient's husband and daughter both were updated]  Labs   CBC: Recent Labs  Lab 09/16/23 0522 09/16/23 0535 09/16/23 0618 09/16/23 1105 09/17/23 0515  WBC 5.6  --   --  10.7* 14.4*  NEUTROABS 3.9  --   --   --   --   HGB 9.2* 10.2* 9.5* 9.1* 10.1*  HCT 30.9* 30.0* 28.0* 28.7* 31.8*  MCV 103.7*  --   --  99.0 98.1  PLT 176  --   --  185 208    Basic Metabolic Panel: Recent Labs  Lab 09/16/23 0522 09/16/23 0535 09/16/23 0618 09/16/23 1105 09/17/23 0515  NA 128* 127* 127*  --  131*  K 3.9 4.0 3.8  --  3.9  CL 91* 95*  --   --  95*  CO2 22  --   --   --  20*  GLUCOSE 254* 256*  --   --  146*  BUN 23* 27*  --   --  29*  CREATININE 3.91* 3.90*  --  4.08* 4.33*  CALCIUM  10.0  --   --   --  9.5  MG 2.3  --   --   --  2.1  PHOS  --   --   --   --  3.2   GFR: Estimated Creatinine Clearance: 15 mL/min (A) (by C-G formula based on SCr of 4.33 mg/dL (H)). Recent Labs  Lab 09/16/23 0522 09/16/23 1105 09/17/23 0515  WBC 5.6 10.7* 14.4*    Liver Function Tests: Recent Labs  Lab 09/16/23 0522  AST 36  ALT 13  ALKPHOS 140*  BILITOT 1.0  PROT 5.4*  ALBUMIN 2.7*   No results for input(s): LIPASE, AMYLASE in the last 168 hours. No results  for input(s): AMMONIA in the last 168 hours.  ABG    Component Value Date/Time   PHART 7.410 09/16/2023 0618   PCO2ART 38.1 09/16/2023 0618   PO2ART 108 09/16/2023 0618   HCO3 24.6 09/16/2023 0618   TCO2 26 09/16/2023 0618   O2SAT 99 09/16/2023 0618     Coagulation Profile: No results for input(s): INR, PROTIME in the last 168 hours.  Cardiac Enzymes: No results for input(s): CKTOTAL, CKMB, CKMBINDEX, TROPONINI in the last 168 hours.  HbA1C: Hgb A1c MFr Bld  Date/Time Value Ref Range Status  09/16/2023 11:05 AM 4.6 (L) 4.8 - 5.6 % Final  Comment:    (NOTE) Diagnosis of Diabetes The following HbA1c ranges recommended by the American Diabetes Association (ADA) may be used as an aid in the diagnosis of diabetes mellitus.  Hemoglobin             Suggested A1C NGSP%              Diagnosis  <5.7                   Non Diabetic  5.7-6.4                Pre-Diabetic  >6.4                   Diabetic  <7.0                   Glycemic control for                       adults with diabetes.    04/16/2021 06:15 AM 6.3 (H) 4.8 - 5.6 % Final    Comment:    (NOTE) Pre diabetes:          5.7%-6.4%  Diabetes:              >6.4%  Glycemic control for   <7.0% adults with diabetes     CBG: Recent Labs  Lab 09/16/23 2319 09/17/23 0010 09/17/23 0301 09/17/23 0725 09/17/23 1131  GLUCAP 48* 111* 148* 152* 152*       Critical care time: 61m     The patient is critically ill due to status post PEA cardiac arrest/acute respiratory failure with hypoxia.  Critical care was necessary to treat or prevent imminent or life-threatening deterioration.  Critical care was time spent personally by me on the following activities: development of treatment plan with patient and/or surrogate as well as nursing, discussions with consultants, evaluation of patient's response to treatment, examination of patient, obtaining history from patient or surrogate, ordering and  performing treatments and interventions, ordering and review of laboratory studies, ordering and review of radiographic studies, pulse oximetry, re-evaluation of patient's condition and participation in multidisciplinary rounds.   During this encounter critical care time was devoted to patient care services described in this note for 35 minutes.     Harden ROCKFORD Jude  MD [facility] See Amion for pager If no response to pager, please call [phone] until 7pm After 7pm, Please call E-link [phone]

## 2023-09-17 NOTE — Progress Notes (Signed)
 Per Jude, MD, ok to leave IO access in place at this time. Will re-evaluate tomorrow afternoon.

## 2023-09-17 NOTE — Procedures (Addendum)
 Patient Name: Holly [name]  MRN: [id number]  Epilepsy Attending: Arlin MALVA Krebs  Referring Physician/Provider: Krebs Arlin MALVA, MD  Duration: 09/16/2023 1146 to 09/17/2023 1146   Patient history: 58yo F s/p cardiac arrest. EEG to evaluate for seizure   Level of alertness: comatose   AEDs during EEG study: Prop, VPA, LEV   Technical aspects: This EEG study was done with scalp electrodes positioned according to the 10-20 International system of electrode placement. Electrical activity was reviewed with band pass filter of 1-70Hz , sensitivity of 7 uV/mm, display speed of 58mm/sec with a 60Hz  notched filter applied as appropriate. EEG data were recorded continuously and digitally stored.  Video monitoring was available and reviewed as appropriate.   Description: Patient was noted to have episodes of brief sudden eye opening with whole body jerking lasting 3-5 seconds lasting every 30 seconds to 1 minute.  Concomitant EEG showed generalized polyspikes consistent with myoclonic seizures.  In between seizures EEG showed generalized background suppression. Gradually as sedation was adjusted, clinical seizures abated.  However EEG showed burst suppression with highly epileptiform bursts lasting 5 to 10 seconds alternating with 5-15 seconds of generalized EEG suppression. Hyperventilation and photic stimulation were not performed.      ABNORMALITY - Myoclonic seizure, generalized - Burst suppression with highly epileptiform bursts, generalized   IMPRESSION: Patient was noted to have myoclonic seizures every 30 seconds to 1 minute.  Gradually as sedation was adjusted, clinical seizures abated.  However EEG continued to show evidence of epileptogenicity with generalized onset.  Given the frequency and morphology of discharges, this EEG pattern was on the ictal- interictal continuum with high suspicion for ictal nature.  Additionally there was evidence of severe to profound diffuse encephalopathy.  In  the setting of cardiac arrest, this EEG pattern is concerning for anoxic/hypoxic brain injury.   Sarafina Puthoff O Erikah Thumm

## 2023-09-18 ENCOUNTER — Inpatient Hospital Stay (HOSPITAL_COMMUNITY)

## 2023-09-18 ENCOUNTER — Encounter (HOSPITAL_COMMUNITY): Payer: Self-pay | Admitting: Internal Medicine

## 2023-09-18 DIAGNOSIS — Z515 Encounter for palliative care: Secondary | ICD-10-CM | POA: Diagnosis not present

## 2023-09-18 DIAGNOSIS — I469 Cardiac arrest, cause unspecified: Secondary | ICD-10-CM | POA: Diagnosis not present

## 2023-09-18 DIAGNOSIS — R569 Unspecified convulsions: Secondary | ICD-10-CM | POA: Diagnosis not present

## 2023-09-18 DIAGNOSIS — L8990 Pressure ulcer of unspecified site, unspecified stage: Secondary | ICD-10-CM | POA: Insufficient documentation

## 2023-09-18 DIAGNOSIS — G931 Anoxic brain damage, not elsewhere classified: Secondary | ICD-10-CM | POA: Diagnosis not present

## 2023-09-18 DIAGNOSIS — G40419 Other generalized epilepsy and epileptic syndromes, intractable, without status epilepticus: Secondary | ICD-10-CM | POA: Diagnosis not present

## 2023-09-18 DIAGNOSIS — J9601 Acute respiratory failure with hypoxia: Secondary | ICD-10-CM | POA: Diagnosis not present

## 2023-09-18 LAB — CBC WITH DIFFERENTIAL/PLATELET
Abs Immature Granulocytes: 0.11 K/uL — ABNORMAL HIGH (ref 0.00–0.07)
Basophils Absolute: 0.1 K/uL (ref 0.0–0.1)
Basophils Relative: 1 %
Eosinophils Absolute: 0.2 K/uL (ref 0.0–0.5)
Eosinophils Relative: 1 %
HCT: 36.7 % (ref 36.0–46.0)
Hemoglobin: 11.6 g/dL — ABNORMAL LOW (ref 12.0–15.0)
Immature Granulocytes: 1 %
Lymphocytes Relative: 11 %
Lymphs Abs: 1.4 K/uL (ref 0.7–4.0)
MCH: 31.5 pg (ref 26.0–34.0)
MCHC: 31.6 g/dL (ref 30.0–36.0)
MCV: 99.7 fL (ref 80.0–100.0)
Monocytes Absolute: 1.2 K/uL — ABNORMAL HIGH (ref 0.1–1.0)
Monocytes Relative: 9 %
Neutro Abs: 9.9 K/uL — ABNORMAL HIGH (ref 1.7–7.7)
Neutrophils Relative %: 77 %
Platelets: 164 K/uL (ref 150–400)
RBC: 3.68 MIL/uL — ABNORMAL LOW (ref 3.87–5.11)
RDW: 18.1 % — ABNORMAL HIGH (ref 11.5–15.5)
WBC: 13 K/uL — ABNORMAL HIGH (ref 4.0–10.5)
nRBC: 0 % (ref 0.0–0.2)

## 2023-09-18 LAB — GLUCOSE, CAPILLARY
Glucose-Capillary: 173 mg/dL — ABNORMAL HIGH (ref 70–99)
Glucose-Capillary: 202 mg/dL — ABNORMAL HIGH (ref 70–99)
Glucose-Capillary: 203 mg/dL — ABNORMAL HIGH (ref 70–99)
Glucose-Capillary: 192 mg/dL — ABNORMAL HIGH (ref 70–99)
Glucose-Capillary: 132 mg/dL — ABNORMAL HIGH (ref 70–99)
Glucose-Capillary: 140 mg/dL — ABNORMAL HIGH (ref 70–99)

## 2023-09-18 LAB — BASIC METABOLIC PANEL WITH GFR
Anion gap: 13 (ref 5–15)
BUN: 21 mg/dL — ABNORMAL HIGH (ref 6–20)
CO2: 24 mmol/L (ref 22–32)
Calcium: 9.2 mg/dL (ref 8.9–10.3)
Chloride: 92 mmol/L — ABNORMAL LOW (ref 98–111)
Creatinine, Ser: 3.25 mg/dL — ABNORMAL HIGH (ref 0.44–1.00)
GFR, Estimated: 16 mL/min — ABNORMAL LOW (ref >60)
Glucose, Bld: 218 mg/dL — ABNORMAL HIGH (ref 70–99)
Potassium: 3.8 mmol/L (ref 3.5–5.1)
Sodium: 129 mmol/L — ABNORMAL LOW (ref 135–145)

## 2023-09-18 LAB — HEPATITIS B SURFACE ANTIBODY, QUANTITATIVE: Hep B S AB Quant (Post): 4.3 m[IU]/mL — ABNORMAL LOW

## 2023-09-18 LAB — PHOSPHORUS: Phosphorus: 2.6 mg/dL (ref 2.5–4.6)

## 2023-09-18 LAB — MAGNESIUM: Magnesium: 1.9 mg/dL (ref 1.7–2.4)

## 2023-09-18 MED ORDER — VALPROIC ACID 250 MG/5ML PO SOLN
500.0000 mg | Freq: Three times a day (TID) | ORAL | Status: DC
  Administered 2023-09-18 – 2023-09-19 (×3): 500 mg
  Filled 2023-09-18 (×3): qty 10

## 2023-09-18 MED ORDER — INSULIN ASPART 100 UNIT/ML IJ SOLN
0.0000 [IU] | INTRAMUSCULAR | Status: DC
  Administered 2023-09-18: 3 [IU] via SUBCUTANEOUS
  Administered 2023-09-18 (×2): 1 [IU] via SUBCUTANEOUS
  Administered 2023-09-18: 3 [IU] via SUBCUTANEOUS
  Administered 2023-09-18: 2 [IU] via SUBCUTANEOUS
  Administered 2023-09-19: 1 [IU] via SUBCUTANEOUS

## 2023-09-18 MED ORDER — FAMOTIDINE 20 MG PO TABS
10.0000 mg | ORAL_TABLET | Freq: Every day | ORAL | Status: DC
  Administered 2023-09-18 – 2023-09-19 (×2): 10 mg
  Filled 2023-09-18 (×2): qty 1

## 2023-09-18 MED ORDER — LEVETIRACETAM 500 MG PO TABS
500.0000 mg | ORAL_TABLET | Freq: Two times a day (BID) | ORAL | Status: DC
  Administered 2023-09-18 – 2023-09-19 (×3): 500 mg
  Filled 2023-09-18 (×3): qty 1

## 2023-09-18 MED ORDER — ALBUMIN HUMAN 5 % IV SOLN
12.5000 g | Freq: Once | INTRAVENOUS | Status: AC
  Administered 2023-09-18: 12.5 g via INTRAVENOUS
  Filled 2023-09-18: qty 250

## 2023-09-18 NOTE — Progress Notes (Addendum)
 NEUROLOGY CONSULT FOLLOW UP NOTE   Date of service: September 18, 2023 Patient Name: Holly [name] MRN:  [id number] DOB:  03-04-1965  Interval Hx/subjective   Eeg without epileptiform bursts any longer  Vitals   Vitals:   09/18/23 0745 09/18/23 0800 09/18/23 0815 09/18/23 0830  BP: (!) 130/43 (!) 120/40 (!) 111/40 (!) 107/48  Pulse: 65 66 65 67  Resp: 18 18 18 18   Temp: 98.6 F (37 C) 98.8 F (37.1 C) 98.1 F (36.7 C) 99 F (37.2 C)  TempSrc:  Esophageal    SpO2: 100% 100% 100% 100%  Weight:      Height:         Body mass index is 27.8 kg/m.  Physical Exam   Intubated   Neurologic Examination    MS: Does not open eyes or follow commands CN: Pupils are reactive, corneal intact on the left, nnot on right, no cough Motor: No response to noxious stimulation Sensory: As above  Medications  Current Facility-Administered Medications:    acetaminophen  (TYLENOL ) tablet 650 mg, 650 mg, Oral, Q4H PRN **OR** acetaminophen  (TYLENOL ) 160 MG/5ML solution 650 mg, 650 mg, Per Tube, Q4H PRN **OR** acetaminophen  (TYLENOL ) suppository 650 mg, 650 mg, Rectal, Q4H PRN, Harold, Sudham, MD   Ampicillin -Sulbactam (UNASYN ) 3 g in sodium chloride  0.9 % 100 mL IVPB, 3 g, Intravenous, Q12H, Harold Scholz, MD, Paused at 09/18/23 0334   Chlorhexidine  Gluconate Cloth 2 % PADS 6 each, 6 each, Topical, Daily, Chand, Scholz, MD, 6 each at 09/17/23 2306   Chlorhexidine  Gluconate Cloth 2 % PADS 6 each, 6 each, Topical, Q0600, Geralynn Charleston, MD   docusate (COLACE) 50 MG/5ML liquid 100 mg, 100 mg, Per Tube, BID, Chand, Sudham, MD, 100 mg at 09/18/23 0909   enoxaparin  (LOVENOX ) injection 30 mg, 30 mg, Subcutaneous, Q24H, Chand, Sudham, MD, 30 mg at 09/17/23 1704   famotidine  (PEPCID ) tablet 10 mg, 10 mg, Per Tube, Daily, Alva, Rakesh V, MD, 10 mg at 09/18/23 9090   feeding supplement (OSMOLITE 1.5 CAL) liquid 1,000 mL, 1,000 mL, Per Tube, Continuous, Jude Harden GAILS, MD, Last Rate: 25 mL/hr at 09/18/23  0800, Infusion Verify at 09/18/23 0800   insulin  aspart (novoLOG ) injection 0-9 Units, 0-9 Units, Subcutaneous, Q4H, Alva, Rakesh V, MD, 3 Units at 09/18/23 0907   levETIRAcetam  (KEPPRA ) tablet 500 mg, 500 mg, Per Tube, BID, Alva, Rakesh V, MD, 500 mg at 09/18/23 0909   midazolam  (VERSED ) injection 2 mg, 2 mg, Intravenous, Once, Chand, Sudham, MD   norepinephrine  (LEVOPHED ) 4mg  in (0.016 mg/mL) premix infusion, 0-10 mcg/min, Intravenous, Titrated, Chand, Sudham, MD, Last Rate: 15 mL/hr at 09/18/23 0800, 4 mcg/min at 09/18/23 0800   ondansetron  (ZOFRAN ) injection 4 mg, 4 mg, Intravenous, Q6H PRN, Harold Scholz, MD   Oral care mouth rinse, 15 mL, Mouth Rinse, Q2H, Chand, Sudham, MD, 15 mL at 09/18/23 0755   Oral care mouth rinse, 15 mL, Mouth Rinse, PRN, Harold, Sudham, MD   polyethylene glycol (MIRALAX  / GLYCOLAX ) packet 17 g, 17 g, Per Tube, Daily, Chand, Sudham, MD, 17 g at 09/18/23 0909   propofol  (DIPRIVAN ) 1000 MG/100ML infusion, 20 mcg/kg/min, Intravenous, Continuous, Jude Harden V, MD, Last Rate: 9.52 mL/hr at 09/18/23 0804, 20 mcg/kg/min at 09/18/23 9195   valproic  acid (DEPAKENE ) 250 MG/5ML solution 500 mg, 500 mg, Per Tube, Q8H, Jude Harden GAILS, MD  Labs and Diagnostic Imaging   Imaging(Personally reviewed): CT head-I do feel that the deep structures are indistinct with blurring of the gray-white  junction   Assessment   Sydny Schnitzler Fehnel is a 58 y.o. female with anoxic brain injury.  As evidenced by early myoclonus, super refractory myoclonic status, early CT change, poor exam(no motor response on day three), I feel that at this point we can say that her overall prognosis is extremely poor and she does not have any significant chance of recovery to an independent level of functioning.   Recommendations  Discussions with family regarding goals of care Continue holding sedation Continue Depakote and Keppra  Neurology will continue to  follow ______________________________________________________________________  This patient is critically ill and at significant risk of neurological worsening, death and care requires constant monitoring of vital signs, hemodynamics,respiratory and cardiac monitoring, neurological assessment, discussion with family, other specialists and medical decision making of high complexity. I spent 35 minutes of neurocritical care time  in the care of  this patient. This was time spent independent of any time provided by nurse practitioner or PA.  Aisha Seals, MD [facility]   If 7pm- 7am, please page neurology on call as listed in AMION. 09/18/2023  9:21 AM

## 2023-09-18 NOTE — Progress Notes (Signed)
 NAME:  Holly [name], MRN:  [id number], DOB:  Jul 30, 1965, LOS: 2 ADMISSION DATE:  09/16/2023, CONSULTATION DATE: 09/16/2023 REFERRING MD:  Haze Millman , CHIEF COMPLAINT: Status postcardiac arrest  History of Present Illness:  58 year old female with end-stage renal disease on hemodialysis, diabetes type 2, hypertension, hyperlipidemia was brought into the emergency department from skilled care nursing facility status post PEA cardiac arrest.  Per nursing report patient called out to nursing home staff due to increasing shortness of breath, they attempted to put oxygen on her but she collapsed falling face forward, noted to be in asystole, CPR was initiated, she received 3 rounds of epinephrine, given calcium  and bicarbonate.  She was intubated and was brought into the emergency department  During my evaluation patient is having generalized myoclonic jerking  Pertinent  Medical History   Past Medical History:  Diagnosis Date   Anxiety state, unspecified    Background diabetic retinopathy(362.01)    Bulimia    Calculus of kidney    Cellulitis and abscess of foot 01/24/2015   left foot   Chronic inflammatory demyelinating polyneuritis (HCC)    Chronic kidney disease, stage IV (severe) (HCC)    DM2 (diabetes mellitus, type 2) (HCC)    Dysthymic disorder    Esophageal reflux    Essential hypertension, benign    Heart murmur    been told very slight, never given her any problems   History of blood transfusion X 6-7; 2010 - present (03/29/2014)   related to OR; kidney issues   HLD (hyperlipidemia)    HDL goal >50, LDL goal <100   Iron deficiency anemia    suppose to get procrit  injections q 3 wks; usually don't do it (03/29/2014)   Irritable bowel syndrome    Left heart failure (HCC)    Primary pulmonary hypertension (HCC)    not seen at CATH  (35mm Hg), pt not aware of this   RLS (restless legs syndrome)      Significant Hospital Events: Including procedures, antibiotic  start and stop dates in addition to other pertinent events   8/21 myoclonic seizures postadmission >> LTM EEG, loaded with Keppra  and Depakote 8/22 GOC discussion >> continue full medical care, family indicates they would not want prolonged life support  Interim History / Subjective:  Critically ill, intubated Sedated on propofol  On LTM EEG Remains on low-dose Levophed  Underwent HD this morning, 2.5 L removed   Objective    Blood pressure (!) 150/36, pulse 67, temperature 99.1 F (37.3 C), resp. rate 18, height 5' 7 (1.702 m), weight 80.5 kg, last menstrual period 07/17/2011, SpO2 100%.    Vent Mode: PRVC FiO2 (%):  [40 %-50 %] 40 % Set Rate:  [18 bmp] 18 bmp Vt Set:  [490 mL] 490 mL PEEP:  [5 cmH20] 5 cmH20 Plateau Pressure:  [21 cmH20-26 cmH20] 23 cmH20   Intake/Output Summary (Last 24 hours) at 09/18/2023 1020 Last data filed at 09/18/2023 1000 Gross per 24 hour  Intake 1599.59 ml  Output 2500 ml  Net -900.41 ml   Filed Weights   09/17/23 0500 09/17/23 2200 09/18/23 0500  Weight: 79.3 kg 81.6 kg 80.5 kg    Examination: General: Crtitically ill-appearing female, orally intubated HEENT: , [place]/AT, eyes anicteric.  ETT and GT in place Neuro: RASS -3, pupils 2 mm reactive to light, absent right corneal Chest: Bilateral ventilated breath sounds, no accessory muscle use Heart: S1-S2 regular Abdomen: Soft, nondistended, bowel sounds present  Repeat head CT 8/22 shows loss  of gray-white differentiation CT angio chest negative for PE, cardiomegaly with bilateral effusions, bilateral lower lobe consolidation consistent with aspiration  Labs show hyponatremia, decreased mild leukocytosis   Resolved problem list   Assessment and Plan  Status post PEA cardiac arrest, likely hypoxia induced Generalized status myoclonus, due to anoxic brain injury Anoxic encephalopathy  EEG initially showed burst suppression with highly epileptiform myoclonic bursts, generalized 8/23  improved to high amplitude slow waves  - Continue supportive care - TTM, avoid fevers - Continue Keppra  and Depakote , DC propofol  and observe clinically - Poor prognosis for meaningful neurologic recovery conveyed to family    Acute respiratory failure with hypoxia Probable aspiration pneumonia  - Unasyn  -Continue full vent support   End-stage renal disease on hemodialysis Hypervolemic hyponatremia - Underwent HD today  Diabetes type 2  -SSI Anemia of renal disease  -Monitor  Guarded prognosis for meaningful neurologic recovery has been conveyed to family, await further goals of care conversation.  They have indicated that they would not want prolonged life support if neuroprognosis were definite  Best Practice (right click and Reselect all SmartList Selections daily)   Diet/type: NPO DVT prophylaxis systemic heparin  Pressure ulcer(s): Please see nursing notes GI prophylaxis: H2B Lines: N/A Foley:  N/A Code Status:  full code Last date of multidisciplinary goals of care discussion [8/22: Patient's husband and daughter both were updated]  Labs   CBC: Recent Labs  Lab 09/16/23 0522 09/16/23 0535 09/16/23 0618 09/16/23 1105 09/17/23 0515 09/18/23 0852  WBC 5.6  --   --  10.7* 14.4* 13.0*  NEUTROABS 3.9  --   --   --   --  9.9*  HGB 9.2* 10.2* 9.5* 9.1* 10.1* 11.6*  HCT 30.9* 30.0* 28.0* 28.7* 31.8* 36.7  MCV 103.7*  --   --  99.0 98.1 99.7  PLT 176  --   --  185 208 164    Basic Metabolic Panel: Recent Labs  Lab 09/16/23 0522 09/16/23 0535 09/16/23 0618 09/16/23 1105 09/17/23 0515 09/18/23 0852  NA 128* 127* 127*  --  131* 129*  K 3.9 4.0 3.8  --  3.9 3.8  CL 91* 95*  --   --  95* 92*  CO2 22  --   --   --  20* 24  GLUCOSE 254* 256*  --   --  146* 218*  BUN 23* 27*  --   --  29* 21*  CREATININE 3.91* 3.90*  --  4.08* 4.33* 3.25*  CALCIUM  10.0  --   --   --  9.5 9.2  MG 2.3  --   --   --  2.1 1.9  PHOS  --   --   --   --  3.2 2.6    GFR: Estimated Creatinine Clearance: 20.1 mL/min (A) (by C-G formula based on SCr of 3.25 mg/dL (H)). Recent Labs  Lab 09/16/23 0522 09/16/23 1105 09/17/23 0515 09/18/23 0852  WBC 5.6 10.7* 14.4* 13.0*    Liver Function Tests: Recent Labs  Lab 09/16/23 0522  AST 36  ALT 13  ALKPHOS 140*  BILITOT 1.0  PROT 5.4*  ALBUMIN  2.7*   No results for input(s): LIPASE, AMYLASE in the last 168 hours. No results for input(s): AMMONIA in the last 168 hours.  ABG    Component Value Date/Time   PHART 7.410 09/16/2023 0618   PCO2ART 38.1 09/16/2023 0618   PO2ART 108 09/16/2023 0618   HCO3 24.6 09/16/2023 0618   TCO2 26 09/16/2023  9381   O2SAT 99 09/16/2023 0618     Coagulation Profile: No results for input(s): INR, PROTIME in the last 168 hours.  Cardiac Enzymes: No results for input(s): CKTOTAL, CKMB, CKMBINDEX, TROPONINI in the last 168 hours.  HbA1C: Hgb A1c MFr Bld  Date/Time Value Ref Range Status  09/16/2023 11:05 AM 4.6 (L) 4.8 - 5.6 % Final    Comment:    (NOTE) Diagnosis of Diabetes The following HbA1c ranges recommended by the American Diabetes Association (ADA) may be used as an aid in the diagnosis of diabetes mellitus.  Hemoglobin             Suggested A1C NGSP%              Diagnosis  <5.7                   Non Diabetic  5.7-6.4                Pre-Diabetic  >6.4                   Diabetic  <7.0                   Glycemic control for                       adults with diabetes.    04/16/2021 06:15 AM 6.3 (H) 4.8 - 5.6 % Final    Comment:    (NOTE) Pre diabetes:          5.7%-6.4%  Diabetes:              >6.4%  Glycemic control for   <7.0% adults with diabetes     CBG: Recent Labs  Lab 09/17/23 1539 09/17/23 1926 09/17/23 2331 09/18/23 0324 09/18/23 0734  GLUCAP 106* 124* 176* 173* 202*       Critical care time: 43 m     The patient is critically ill due to status post PEA cardiac arrest/acute respiratory  failure with hypoxia.  Critical care was necessary to treat or prevent imminent or life-threatening deterioration.  Critical care was time spent personally by me on the following activities: development of treatment plan with patient and/or surrogate as well as nursing, discussions with consultants, evaluation of patient's response to treatment, examination of patient, obtaining history from patient or surrogate, ordering and performing treatments and interventions, ordering and review of laboratory studies, ordering and review of radiographic studies, pulse oximetry, re-evaluation of patient's condition and participation in multidisciplinary rounds.       Harden ROCKFORD Jude  MD [facility] [place] [facility] See Amion for pager If no response to pager, please call [phone] until 7pm After 7pm, Please call E-link [phone]

## 2023-09-18 NOTE — Consult Note (Signed)
 Palliative Medicine  Name: Holly [name] Date: 09/18/2023 MRN: [id number]  DOB: 1965-11-27  Patient Care Team: Pura Lenis, MD as PCP - General (Family Medicine) Deterding, Lynwood, MD as Consulting Physician (Nephrology)    REASON FOR CONSULTATION: Holly [name] is a 58 y.o. female with multiple medical problems including ESRD on hemodialysis, diabetes type 2, hypertension, and hyperlipidemia, who was admitted to the hospital on 09/16/2023 after having a PEA arrest at rehab center.  Unfortunately, patient sustained anoxic injury.  Palliative care was consulted to address goals.  SOCIAL HISTORY:     reports that she has never smoked. She has never used smokeless tobacco. She reports that she does not currently use alcohol . She reports that she does not use drugs.  Patient is married and lives at home with her husband, son, and daughter.  Patient has another son and daughter who live outside the home.  ADVANCE DIRECTIVES:  Not on file  CODE STATUS: Full code  PAST MEDICAL HISTORY: Past Medical History:  Diagnosis Date   Anxiety state, unspecified    Background diabetic retinopathy(362.01)    Bulimia    Calculus of kidney    Cellulitis and abscess of foot 01/24/2015   left foot   Chronic inflammatory demyelinating polyneuritis (HCC)    Chronic kidney disease, stage IV (severe) (HCC)    DM2 (diabetes mellitus, type 2) (HCC)    Dysthymic disorder    Esophageal reflux    Essential hypertension, benign    Heart murmur    been told very slight, never given her any problems   History of blood transfusion X 6-7; 2010 - present (03/29/2014)   related to OR; kidney issues   HLD (hyperlipidemia)    HDL goal >50, LDL goal <100   Iron deficiency anemia    suppose to get procrit  injections q 3 wks; usually don't do it (03/29/2014)   Irritable bowel syndrome    Left heart failure (HCC)    Primary pulmonary hypertension (HCC)    not seen at CATH  (35mm Hg), pt not aware of  this   RLS (restless legs syndrome)     PAST SURGICAL HISTORY:  Past Surgical History:  Procedure Laterality Date   AMPUTATION Left 01/26/2015   Procedure: POST TRANS MET ;  Surgeon: Jerona Harden GAILS, MD;  Location: [facility] OR;  Service: Orthopedics;  Laterality: Left;   APPENDECTOMY     AV FISTULA PLACEMENT Right 01/08/2015   Procedure: ARTERIOVENOUS (AV) FISTULA CREATION;  Surgeon: Lonni GORMAN Blade, MD;  Location: [facility] OR;  Service: Vascular;  Laterality: Right;   CESAREAN SECTION  2001; 1999; 1994   DILATION AND CURETTAGE OF UTERUS     EYE SURGERY     FOOT SURGERY Right 01/2022   Had infection, cleaned out   INCISION AND DRAINAGE OF WOUND Right 01/27/2008   serious staph infection   PARS PLANA VITRECTOMY Bilateral 01/27/2008   related to DM   TONSILLECTOMY AND ADENOIDECTOMY      HEMATOLOGY/ONCOLOGY HISTORY:  Oncology History   No history exists.    ALLERGIES:  is allergic to bactrim, byetta 10 mcg pen [exenatide], hctz [hydrochlorothiazide ], spironolactone, sulfa antibiotics, sulfacetamide sodium, and sulfamethoxazole-trimethoprim.  MEDICATIONS:  Current Facility-Administered Medications  Medication Dose Route Frequency Provider Last Rate Last Admin   acetaminophen  (TYLENOL ) tablet 650 mg  650 mg Oral Q4H PRN Harold Scholz, MD       Or   acetaminophen  (TYLENOL ) 160 MG/5ML solution 650 mg  650 mg Per  Tube Q4H PRN Chand, Sudham, MD   650 mg at 09/18/23 1137   Or   acetaminophen  (TYLENOL ) suppository 650 mg  650 mg Rectal Q4H PRN Harold Scholz, MD       Ampicillin -Sulbactam (UNASYN ) 3 g in sodium chloride  0.9 % 100 mL IVPB  3 g Intravenous Q12H Harold Scholz, MD   Stopped at 09/18/23 1355   Chlorhexidine  Gluconate Cloth 2 % PADS 6 each  6 each Topical Daily Chand, Sudham, MD   6 each at 09/17/23 2306   Chlorhexidine  Gluconate Cloth 2 % PADS 6 each  6 each Topical Q0600 Geralynn Charleston, MD       docusate (COLACE) 50 MG/5ML liquid 100 mg  100 mg Per Tube BID Chand, Sudham, MD    100 mg at 09/18/23 9090   enoxaparin  (LOVENOX ) injection 30 mg  30 mg Subcutaneous Q24H Harold Scholz, MD   30 mg at 09/17/23 1704   famotidine  (PEPCID ) tablet 10 mg  10 mg Per Tube Daily Alva, Rakesh V, MD   10 mg at 09/18/23 0909   feeding supplement (OSMOLITE 1.5 CAL) liquid 1,000 mL  1,000 mL Per Tube Continuous Alva, Rakesh V, MD 25 mL/hr at 09/18/23 1400 Infusion Verify at 09/18/23 1400   insulin  aspart (novoLOG ) injection 0-9 Units  0-9 Units Subcutaneous Q4H Jude Harden GAILS, MD   3 Units at 09/18/23 1137   levETIRAcetam  (KEPPRA ) tablet 500 mg  500 mg Per Tube BID Alva, Rakesh V, MD   500 mg at 09/18/23 0909   midazolam  (VERSED ) injection 2 mg  2 mg Intravenous Once Chand, Sudham, MD       norepinephrine  (LEVOPHED ) 4mg  in (0.016 mg/mL) premix infusion  0-10 mcg/min Intravenous Titrated Alva, Rakesh V, MD 15 mL/hr at 09/18/23 1400 4 mcg/min at 09/18/23 1400   ondansetron  (ZOFRAN ) injection 4 mg  4 mg Intravenous Q6H PRN Harold Scholz, MD       Oral care mouth rinse  15 mL Mouth Rinse Q2H Chand, Sudham, MD   15 mL at 09/18/23 1325   Oral care mouth rinse  15 mL Mouth Rinse PRN Chand, Sudham, MD       polyethylene glycol (MIRALAX  / GLYCOLAX ) packet 17 g  17 g Per Tube Daily Chand, Sudham, MD   17 g at 09/18/23 9090   propofol  (DIPRIVAN ) 1000 MG/100ML infusion  20 mcg/kg/min Intravenous Continuous Jude Harden GAILS, MD   Stopped at 09/18/23 0910   valproic  acid (DEPAKENE ) 250 MG/5ML solution 500 mg  500 mg Per Tube Q8H Jude Harden GAILS, MD   500 mg at 09/18/23 1326    VITAL SIGNS: BP (!) 130/36   Pulse 66   Temp 100 F (37.8 C)   Resp 18   Ht 5' 7 (1.702 m)   Wt 177 lb 7.5 oz (80.5 kg)   LMP 07/17/2011   SpO2 100%   BMI 27.80 kg/m  Filed Weights   09/17/23 0500 09/17/23 2200 09/18/23 0500  Weight: 174 lb 13.2 oz (79.3 kg) 179 lb 14.3 oz (81.6 kg) 177 lb 7.5 oz (80.5 kg)    Estimated body mass index is 27.8 kg/m as calculated from the following:   Height as of this encounter: 5'  7 (1.702 m).   Weight as of this encounter: 177 lb 7.5 oz (80.5 kg).  LABS: CBC:    Component Value Date/Time   WBC 13.0 (H) 09/18/2023 0852   HGB 11.6 (L) 09/18/2023 0852   HCT 36.7 09/18/2023 9147  PLT 164 09/18/2023 0852   MCV 99.7 09/18/2023 0852   NEUTROABS 9.9 (H) 09/18/2023 0852   LYMPHSABS 1.4 09/18/2023 0852   MONOABS 1.2 (H) 09/18/2023 0852   EOSABS 0.2 09/18/2023 0852   BASOSABS 0.1 09/18/2023 0852   Comprehensive Metabolic Panel:    Component Value Date/Time   NA 129 (L) 09/18/2023 0852   K 3.8 09/18/2023 0852   CL 92 (L) 09/18/2023 0852   CO2 24 09/18/2023 0852   BUN 21 (H) 09/18/2023 0852   CREATININE 3.25 (H) 09/18/2023 0852   CREATININE 2.89 (H) 07/23/2013 0905   GLUCOSE 218 (H) 09/18/2023 0852   CALCIUM  9.2 09/18/2023 0852   CALCIUM  9.0 04/16/2021 1130   AST 36 09/16/2023 0522   ALT 13 09/16/2023 0522   ALKPHOS 140 (H) 09/16/2023 0522   BILITOT 1.0 09/16/2023 0522   PROT 5.4 (L) 09/16/2023 0522   ALBUMIN  2.7 (L) 09/16/2023 0522    RADIOGRAPHIC STUDIES: Overnight EEG with video Result Date: 09/17/2023 Shelton Arlin KIDD, MD     09/18/2023  7:27 AM Patient Name: Yoana Staib Helinski MRN: [id number] Epilepsy Attending: Arlin KIDD Shelton Referring Physician/Provider: Shelton Arlin KIDD, MD Duration: 09/16/2023 1146 to 09/17/2023 1146  Patient history: 58yo F s/p cardiac arrest. EEG to evaluate for seizure  Level of alertness: comatose  AEDs during EEG study: Prop, VPA, LEV  Technical aspects: This EEG study was done with scalp electrodes positioned according to the 10-20 International system of electrode placement. Electrical activity was reviewed with band pass filter of 1-70Hz , sensitivity of 7 uV/mm, display speed of 15mm/sec with a 60Hz  notched filter applied as appropriate. EEG data were recorded continuously and digitally stored.  Video monitoring was available and reviewed as appropriate.  Description: Patient was noted to have episodes of brief sudden eye opening  with whole body jerking lasting 3-5 seconds lasting every 30 seconds to 1 minute.  Concomitant EEG showed generalized polyspikes consistent with myoclonic seizures.  In between seizures EEG showed generalized background suppression. Gradually as sedation was adjusted, clinical seizures abated.  However EEG showed burst suppression with highly epileptiform bursts lasting 5 to 10 seconds alternating with 5-15 seconds of generalized EEG suppression. Hyperventilation and photic stimulation were not performed.    ABNORMALITY - Myoclonic seizure, generalized - Burst suppression with highly epileptiform bursts, generalized  IMPRESSION: Patient was noted to have myoclonic seizures every 30 seconds to 1 minute.  Gradually as sedation was adjusted, clinical seizures abated.  However EEG continued to show evidence of epileptogenicity with generalized onset.  Given the frequency and morphology of discharges, this EEG pattern was on the ictal- interictal continuum with high suspicion for ictal nature.  Additionally there was evidence of severe to profound diffuse encephalopathy.  In the setting of cardiac arrest, this EEG pattern is concerning for anoxic/hypoxic brain injury.  Priyanka KIDD Shelton   CT HEAD WO CONTRAST ( ) Result Date: 09/17/2023 CLINICAL DATA:  58 year old female status post trauma. Status post arrest. Query anoxic brain damage. EXAM: CT HEAD WITHOUT CONTRAST TECHNIQUE: Contiguous axial images were obtained from the base of the skull through the vertex without intravenous contrast. RADIATION DOSE REDUCTION: This exam was performed according to the departmental dose-optimization program which includes automated exposure control, adjustment of the mA and/or kV according to patient size and/or use of iterative reconstruction technique. COMPARISON:  Brain MRI 09/06/2023.  Head CT yesterday. FINDINGS: Brain: Mild artifact from scalp EEG electrodes now. Stable cerebral volume. No midline shift, mass effect, or  evidence of intracranial  mass lesion. Stable ventricle size and configuration. No acute intracranial hemorrhage identified. Decreased conspicuity of sulci and gray-white differentiation and both hemispheres (series 4, image 18 today versus series 2, image 19 yesterday. But no discrete cerebral edema, cortically based infarct, intracranial mass effect. Vascular: Calcified atherosclerosis at the skull base. No suspicious intracranial vascular hyperdensity. Skull: Appears stable and intact. Sinuses/Orbits: Improved sinus aeration. Sphenoid mucoperiosteal thickening. Increased bilateral tympanic cavity and mastoid opacification. Intubated on the scout view. Other: Scalp EEG leads in place.  Stable orbits soft tissues. IMPRESSION: Generalized anoxic injury not excluded: subtle decreased conspicuity of bilateral cerebral sulci and gray-white differentiation when compared to the head CT yesterday. No intracranial hemorrhage or mass effect. If the clinical course is equivocal then repeat noncontrast Head CT, or noncontrast Brain MRI would best evaluate further. Electronically Signed   By: VEAR Hurst M.D.   On: 09/17/2023 06:35   DG Abd Portable 1V Result Date: 09/17/2023 CLINICAL DATA:  OG tube placement EXAM: PORTABLE ABDOMEN - 1 VIEW COMPARISON:  09/16/2023 FINDINGS: OG tube has been advanced with the tip in the stomach. Decreasing gaseous distention of the stomach. IMPRESSION: OG tube in the stomach. Electronically Signed   By: Franky Crease M.D.   On: 09/17/2023 00:01   EEG adult Result Date: 09/16/2023 Shelton Arlin KIDD, MD     09/16/2023  5:42 PM Patient Name: Britten Parady Frickey MRN: [id number] Epilepsy Attending: Arlin KIDD Shelton Referring Physician/Provider: Harold Scholz, MD Date: 09/16/2023 Duration: 22.45 mins Patient history: 58yo F s/p cardiac arrest. EEG to evaluate for seizure Level of alertness: comatose AEDs during EEG study: None Technical aspects: This EEG study was done with scalp electrodes positioned  according to the 10-20 International system of electrode placement. Electrical activity was reviewed with band pass filter of 1-70Hz , sensitivity of 7 uV/mm, display speed of 73mm/sec with a 60Hz  notched filter applied as appropriate. EEG data were recorded continuously and digitally stored.  Video monitoring was available and reviewed as appropriate. Description: Patient was noted to have episodes of brief sudden eye opening with whole body jerking lasting 3-5 seconds lasting every 30 seconds to 1 minute.  Concomitant EEG showed generalized polyspikes consistent with myoclonic seizures.  In between seizures EEG showed generalized background suppression. Hyperventilation and photic stimulation were not performed.   ABNORMALITY - Myoclonic seizure, generalized - Background suppression, generalized IMPRESSION: Patient was noted to have myoclonic seizures every 30 seconds to 1 minute.  Additionally there was evidence of severe to profound diffuse encephalopathy.  In the setting of cardiac arrest, this EEG pattern is concerning for anoxic/hypoxic brain injury. Dr. Harold was notified. Arlin KIDD Shelton   CT Angio Chest Pulmonary Embolism (PE) W or WO Contrast Result Date: 09/16/2023 CLINICAL DATA:  Pulmonary embolism (PE) suspected, high prob EXAM: CT ANGIOGRAPHY CHEST WITH CONTRAST TECHNIQUE: Multidetector CT imaging of the chest was performed using the standard protocol during bolus administration of intravenous contrast. Multiplanar CT image reconstructions and MIPs were obtained to evaluate the vascular anatomy. RADIATION DOSE REDUCTION: This exam was performed according to the departmental dose-optimization program which includes automated exposure control, adjustment of the mA and/or kV according to patient size and/or use of iterative reconstruction technique. CONTRAST:  75mL OMNIPAQUE  IOHEXOL  350 MG/ML SOLN COMPARISON:  September 08, 2023 FINDINGS: Pulmonary Embolism: Heterogeneity within the lower lobe pulmonary  arteries bilaterally, more so on the left than the right, likely artifactual due to timing of the contrast bolus. Otherwise, no visualized pulmonary embolism. Enlargement of the main pulmonary  artery, suggesting pulmonary arterial hypertension. Cardiovascular: Moderate cardiomegaly. Moderate volume pericardial effusion.Dense multi-vessel coronary atherosclerosis.No aortic aneurysm. Scattered aortic atherosclerosis. Mediastinum/Nodes: No mediastinal mass.No mediastinal, hilar, or axillary lymphadenopathy. Lungs/Pleura: Endotracheal tube terminates in the upper trachea. Intralobular septal thickening with hazy ground-glass and nodular consolidation in the upper lobes bilaterally. Moderate volume bilateral pleural effusions with dense consolidation in both lower lobes. No pneumothorax. Musculoskeletal: No acute fracture or destructive bone lesion. Bony sequelae of renal osteodystrophy. Multilevel degenerative disc disease of the spine. Upper Abdomen: Esophagogastric tube courses below the diaphragm into the stomach. The distal tip is not visualized. Upper abdominal ascites. Diffuse anasarca. Review of the MIP images confirms the above findings. IMPRESSION: 1. Heterogeneity within the lower lobe pulmonary arteries bilaterally, more so on the left than the right, likely artifactual due to timing of the contrast bolus. Otherwise, no visualized pulmonary embolism. 2. Moderate cardiomegaly with findings of pulmonary edema. Moderate volume bilateral pleural effusions with airspace consolidation in both lower lobes, which may represent a combination of atelectasis and aspiration. 3. Upper abdominal ascites and diffuse anasarca, likely related to patient's volume status. 4. Endotracheal tube terminates in the upper trachea. Esophagogastric tube courses below the diaphragm with the distal tip not included in the field of view. Aortic Atherosclerosis (ICD10-I70.0). Electronically Signed   By: Rogelia Myers M.D.   On:  09/16/2023 15:18   CT HEAD WO CONTRAST ( ) Result Date: 09/16/2023 EXAM: CT HEAD WITHOUT CONTRAST 09/16/2023 06:38:38 AM TECHNIQUE: CT of the head was performed without the administration of intravenous contrast. Automated exposure control, iterative reconstruction, and/or weight based adjustment of the mA/kV was utilized to reduce the radiation dose to as low as reasonably achievable. COMPARISON: CT of the head dated 08/28/2023. CLINICAL HISTORY: Head trauma, moderate-severe. Fall. FINDINGS: BRAIN AND VENTRICLES: No acute hemorrhage. Gray-white differentiation is preserved. No hydrocephalus. No extra-axial collection. No mass effect or midline shift. Mild generalized cerebral volume loss. ORBITS: No acute abnormality. SINUSES: Mucosal disease within the frontal and ethmoid paranasal sinuses. The mastoid air cells are clear. SOFT TISSUES AND SKULL: No acute soft tissue abnormality. No skull fracture. IMPRESSION: 1. No acute intracranial abnormality. 2. Mild generalized cerebral volume loss. 3. Mucosal disease within the frontal and paranasal sinuses. Mastoid air cells are clear. Electronically signed by: Evalene Coho MD 09/16/2023 06:54 AM EDT RP Workstation: [id number]   CT CERVICAL SPINE WO CONTRAST Result Date: 09/16/2023 CLINICAL DATA:  Fall.  Blunt facial trauma. EXAM: CT MAXILLOFACIAL WITHOUT CONTRAST CT CERVICAL SPINE WITHOUT CONTRAST TECHNIQUE: Multidetector CT imaging of the maxillofacial structures was performed. Multiplanar CT image reconstructions were also generated. A small metallic BB was placed on the right temple in order to reliably differentiate right from left. Multidetector CT imaging of the cervical spine was performed without intravenous contrast. Multiplanar CT image reconstructions were also generated. RADIATION DOSE REDUCTION: This exam was performed according to the departmental dose-optimization program which includes automated exposure control, adjustment of the mA and/or kV  according to patient size and/or use of iterative reconstruction technique. COMPARISON:  None Available. FINDINGS: CT MAXILLOFACIAL FINDINGS Osseous: No fracture or mandibular dislocation. No destructive process. Orbits: Visualized portions of the globes and intraorbital fat are unremarkable. Sinuses: Chronic mucosal thickening noted both maxillary sinuses. Trace air-fluid level seen in the frontal sinuses and ethmoid air cells. Chronic opacification of the right sphenoid sinus with mucosal thickening and air-fluid level in the left sphenoid sinus. No mastoid effusion. Soft tissues: Unremarkable. Limited intracranial: See report for head CT performed at the same  time and dictated separately. CT CERVICAL FINDINGS Alignment: Normal. Skull base and vertebrae: Rotation of C1 on 2 secondary to head position. No evidence for an acute fracture of the cervical spine. No findings to suggest traumatic subluxation. Soft tissues and spinal canal: Debris is identified in the posterior nasopharynx in this patient with endotracheal and NG tubes in place. There is diffuse edema in the soft tissues of the neck and upper chest. Disc levels: Preserved throughout. Facets are well aligned bilaterally. Upper chest: Layering pleural effusions noted bilaterally with patchy ground-glass airspace disease in the left apex. Other: None. IMPRESSION: 1. No evidence for an acute fracture of the cervical spine. 2. No evidence for an acute facial bone fracture. 3. Chronic paranasal sinus disease with air-fluid levels in the frontal sinuses, ethmoid air cells, and left sphenoid sinus. Fluid in the paranasal sinuses consider nonspecific in the setting of intubation, but acute on chronic sinusitis could have this appearance. 4. Layering pleural effusions bilaterally with patchy ground-glass airspace disease in the left apex. 5. Diffuse edema in the soft tissues of the neck and upper chest. Electronically Signed   By: Camellia Candle M.D.   On:  09/16/2023 06:53   CT MAXILLOFACIAL WO CONTRAST Result Date: 09/16/2023 CLINICAL DATA:  Fall.  Blunt facial trauma. EXAM: CT MAXILLOFACIAL WITHOUT CONTRAST CT CERVICAL SPINE WITHOUT CONTRAST TECHNIQUE: Multidetector CT imaging of the maxillofacial structures was performed. Multiplanar CT image reconstructions were also generated. A small metallic BB was placed on the right temple in order to reliably differentiate right from left. Multidetector CT imaging of the cervical spine was performed without intravenous contrast. Multiplanar CT image reconstructions were also generated. RADIATION DOSE REDUCTION: This exam was performed according to the departmental dose-optimization program which includes automated exposure control, adjustment of the mA and/or kV according to patient size and/or use of iterative reconstruction technique. COMPARISON:  None Available. FINDINGS: CT MAXILLOFACIAL FINDINGS Osseous: No fracture or mandibular dislocation. No destructive process. Orbits: Visualized portions of the globes and intraorbital fat are unremarkable. Sinuses: Chronic mucosal thickening noted both maxillary sinuses. Trace air-fluid level seen in the frontal sinuses and ethmoid air cells. Chronic opacification of the right sphenoid sinus with mucosal thickening and air-fluid level in the left sphenoid sinus. No mastoid effusion. Soft tissues: Unremarkable. Limited intracranial: See report for head CT performed at the same time and dictated separately. CT CERVICAL FINDINGS Alignment: Normal. Skull base and vertebrae: Rotation of C1 on 2 secondary to head position. No evidence for an acute fracture of the cervical spine. No findings to suggest traumatic subluxation. Soft tissues and spinal canal: Debris is identified in the posterior nasopharynx in this patient with endotracheal and NG tubes in place. There is diffuse edema in the soft tissues of the neck and upper chest. Disc levels: Preserved throughout. Facets are well  aligned bilaterally. Upper chest: Layering pleural effusions noted bilaterally with patchy ground-glass airspace disease in the left apex. Other: None. IMPRESSION: 1. No evidence for an acute fracture of the cervical spine. 2. No evidence for an acute facial bone fracture. 3. Chronic paranasal sinus disease with air-fluid levels in the frontal sinuses, ethmoid air cells, and left sphenoid sinus. Fluid in the paranasal sinuses consider nonspecific in the setting of intubation, but acute on chronic sinusitis could have this appearance. 4. Layering pleural effusions bilaterally with patchy ground-glass airspace disease in the left apex. 5. Diffuse edema in the soft tissues of the neck and upper chest. Electronically Signed   By: Camellia Candle  M.D.   On: 09/16/2023 06:53   DG Abd Portable 1V Result Date: 09/16/2023 CLINICAL DATA:  NG tube placement. EXAM: PORTABLE ABDOMEN - 1 VIEW COMPARISON:  03/19/2008 FINDINGS: NG tube tip is positioned in the proximal stomach. Side port of the NG tube is in the region of the GE junction. Prominent gaseous distention of the stomach evident. IMPRESSION: NG tube tip is in the proximal stomach with side port in the region of the GE junction. NG tube could be advanced approximately 8-9 cm to position the side port below the GE junction. Repeat abdominal x-ray after repositioning recommended. Electronically Signed   By: Camellia Candle M.D.   On: 09/16/2023 05:59   DG Chest Port 1 View Result Date: 09/16/2023 CLINICAL DATA:  Ventilator dependence. EXAM: PORTABLE CHEST 1 VIEW COMPARISON:  09/09/2023 FINDINGS: Endotracheal tube tip is 5.4 cm above the base of the carina. The NG tube passes into the stomach although the distal tip position is not included on the film. Proximal side port of the NG tube is positioned in the region of the GE junction. The cardio pericardial silhouette is enlarged. Retrocardiac left base collapse/consolidation is progressive in the interval. The airspace  disease seen previously at the right base has improved in the interval. Telemetry leads overlie the chest. IMPRESSION: 1. Endotracheal tube tip is 5.4 cm above the base of the carina. 2. Proximal side port of the NG tube is positioned in the region of the GE junction. See report for abdomen x-ray performed at the same time. 3. Progressive retrocardiac left base collapse/consolidation. Electronically Signed   By: Camellia Candle M.D.   On: 09/16/2023 05:58    PERFORMANCE STATUS (ECOG) : 4 - Bedbound  Review of Systems Unable to complete  Physical Exam General: Ill-appearing Cardiovascular: regular rate and rhythm Pulmonary: Unlabored, on vent Extremities: no edema, no joint deformities Skin: no rashes Neurological: Unresponsive  IMPRESSION: Patient with multiple medical problems including ESRD on hemodialysis, who has had multiple hospitalizations starting in June 2025.  She was at a rehab center and developed PEA arrest with eventual ROSC after multiple rounds of epinephrine and CPR.  Unfortunately, it appears the patient sustained a significant anoxic injury.  She remains unresponsive on ventilator.  She has had intermittent myoclonic activity.  Today, met with patient's mother, daughter, and two sons.  Family all verbalized understanding that patient suffered a significant anoxic injury sustained from her prolonged resuscitation.  Her mother tells me that family were trying to hold on to hope that there would be improvement.  However, she says that family now recognize that there is little chance for meaningful recovery. They do not feel that patient would want to be sustained long-term on the ventilator. We briefly discussed the possible option of transition to comfort care. Family would like to wait until tomorrow AM when all family are present to discuss and make any decisions on how to proceed.   PLAN: -Continue current scope of treatment -Family meeting arranged for tomorrow AM   Time  Total: 45 minutes  Visit consisted of counseling and education dealing with the complex and emotionally intense issues of symptom management and palliative care in the setting of serious and potentially life-threatening illness.Greater than 50%  of this time was spent counseling and coordinating care related to the above assessment and plan.  Signed by: Fonda Mower, PhD, NP-C

## 2023-09-18 NOTE — Plan of Care (Signed)
  Problem: Clinical Measurements: Goal: Ability to maintain clinical measurements within normal limits will improve Outcome: Progressing   Problem: Nutrition: Goal: Adequate nutrition will be maintained Outcome: Progressing   Problem: Pain Managment: Goal: General experience of comfort will improve and/or be controlled Outcome: Progressing   Problem: Education: Goal: Knowledge of General Education information will improve Description: Including pain rating scale, medication(s)/side effects and non-pharmacologic comfort measures Outcome: Not Progressing   Problem: Clinical Measurements: Goal: Diagnostic test results will improve Outcome: Not Progressing

## 2023-09-18 NOTE — Progress Notes (Addendum)
 [facility] Progress Note  Subjective:  Had HD overnight w/ 2.5 L removed Did not meet goal d/t hypotension Levo gtt today at 4 micrograms/min  Vitals:   09/18/23 1115 09/18/23 1130 09/18/23 1145 09/18/23 1200  BP: (!) 143/46 (!) 140/35 (!) 132/39 (!) 137/39  Pulse: 67 67 66 66  Resp: 18 18 18 18   Temp: 99.5 F (37.5 C) 99.5 F (37.5 C) 99.3 F (37.4 C) 99.5 F (37.5 C)  TempSrc:    Esophageal  SpO2: 100% 100% 100% 100%  Weight:      Height:        Exam: Gen on vent, sedated Sclera anicteric, throat w/ ETT No jvd or bruits Chest clear anterior/ lateral RRR no MRG Abd soft ntnd no mass or ascites +bs Ext 1-2+ bilat LE/UE edema Neuro is on vent, sedated    RUA AVF+bruit    Home bp meds: Midodrine  10mg  tid     [facility] HD: [facility] 4h  B400   69.5kg   2K bath   AVF   Heparin  2500 Last HD 8/09 at OP unit, post wt 73.5kg Last HD inpatient 8/19 prob at [facility] (in [place])       Assessment/ Plan: S/P PEA cardiac arrest: w/ status myoclonus, possible anoxic brain injury. Per neurology poor prognosis.  Aspiration PNA/ acute resp failure: on vent per CCM ESRD: on HD TTS. Had HD last night off schedule. If needs further RRT would consider CRRT due to sig vol overload and need for BP support.  Hypotension: on levo gtt Volume: hypervolemic w/ pulm edema on CXR 8/21, up 10-15kg by wts Anemia of esrd: Hb 9-11, follow.    Myer Fret MD  [facility] 09/18/2023, 12:29 PM  Recent Labs  Lab 09/16/23 0522 09/16/23 0535 09/17/23 0515 09/18/23 0852  HGB 9.2*   < > 10.1* 11.6*  ALBUMIN  2.7*  --   --   --   CALCIUM  10.0  --  9.5 9.2  PHOS  --   --  3.2 2.6  CREATININE 3.91*   < > 4.33* 3.25*  K 3.9   < > 3.9 3.8   < > = values in this interval not displayed.   No results for input(s): IRON, TIBC, FERRITIN in the last 168 hours. Inpatient medications:  Chlorhexidine  Gluconate Cloth  6 each Topical Daily   Chlorhexidine  Gluconate Cloth  6 each Topical  Q0600   docusate  100 mg Per Tube BID   enoxaparin  (LOVENOX ) injection  30 mg Subcutaneous Q24H   famotidine   10 mg Per Tube Daily   insulin  aspart  0-9 Units Subcutaneous Q4H   levETIRAcetam   500 mg Per Tube BID   midazolam   2 mg Intravenous Once   mouth rinse  15 mL Mouth Rinse Q2H   polyethylene glycol  17 g Per Tube Daily   valproic  acid  500 mg Per Tube Q8H    ampicillin -sulbactam (UNASYN ) IV Stopped (09/18/23 0334)   feeding supplement (OSMOLITE 1.5 CAL) 25 mL/hr at 09/18/23 1200   norepinephrine  (LEVOPHED ) Adult infusion 4 mcg/min (09/18/23 1200)   propofol  (DIPRIVAN ) infusion Stopped (09/18/23 0910)   acetaminophen  **OR** acetaminophen  (TYLENOL ) oral liquid 160 mg/5 mL **OR** acetaminophen , ondansetron  (ZOFRAN ) IV, mouth rinse

## 2023-09-18 NOTE — Procedures (Signed)
 Patient Name: Holly [name]  MRN: [id number]  Epilepsy Attending: Arlin MALVA Krebs  Referring Physician/Provider: Krebs Arlin MALVA, MD  Duration: 09/17/2023 1146 to 09/18/2023 1146   Patient history: 58yo F s/p cardiac arrest. EEG to evaluate for seizure   Level of alertness: comatose   AEDs during EEG study: Propofol , VPA, LEV   Technical aspects: This EEG study was done with scalp electrodes positioned according to the 10-20 International system of electrode placement. Electrical activity was reviewed with band pass filter of 1-70Hz , sensitivity of 7 uV/mm, display speed of 73mm/sec with a 60Hz  notched filter applied as appropriate. EEG data were recorded continuously and digitally stored.  Video monitoring was available and reviewed as appropriate.   Description: EEG initially showed burst suppression with highly epileptiform bursts lasting 5 to 10 seconds alternating with 5-15 seconds of generalized EEG suppression. Gradually EEG improved and epileptiform bursts evolved into near continuous high amplitude sharply contoured 3-5hz  theta-delta slowing. Hyperventilation and photic stimulation were not performed.      ABNORMALITY - Burst suppression with highly epileptiform bursts, generalized - Continuous slow, generalized   IMPRESSION: This study initially showed evidence of epileptogenicity with generalized onset.  This EEG pattern was on the ictal- interictal continuum with high suspicion for ictal nature. Gradually  EEG improved and showed evidence of severe diffuse encephalopathy.      Drema Eddington O Chai Verdejo

## 2023-09-18 NOTE — Progress Notes (Signed)
 HD Note:  Some information was entered later than the data was gathered due to patient care needs. The stated time with the data is accurate.  Received patient in bed for bedside HD.   Unresponsive and sedated.  On vent.   Informed consent signed and in chart.   Access used: Right arm AVF Access issues: None  Patient tolerated treatment fairly well; hypotension epidsode  TX duration: 3.25 hours  Non-Interaction, without acute distress.  Total UF removed: 2.5L.  Goal not met  Hand-off given to patient's nurse.   Porscha Axley RN [facility].

## 2023-09-18 NOTE — Progress Notes (Signed)
 LTM maint complete - no skin breakdown under:  FP1 Fp2 F3. Continue to monitor

## 2023-09-19 ENCOUNTER — Inpatient Hospital Stay (HOSPITAL_COMMUNITY)

## 2023-09-19 DIAGNOSIS — J9601 Acute respiratory failure with hypoxia: Secondary | ICD-10-CM | POA: Diagnosis not present

## 2023-09-19 DIAGNOSIS — Z66 Do not resuscitate: Secondary | ICD-10-CM | POA: Diagnosis not present

## 2023-09-19 DIAGNOSIS — G931 Anoxic brain damage, not elsewhere classified: Secondary | ICD-10-CM | POA: Diagnosis not present

## 2023-09-19 DIAGNOSIS — Z789 Other specified health status: Secondary | ICD-10-CM

## 2023-09-19 DIAGNOSIS — Z7189 Other specified counseling: Secondary | ICD-10-CM | POA: Diagnosis not present

## 2023-09-19 DIAGNOSIS — G40419 Other generalized epilepsy and epileptic syndromes, intractable, without status epilepticus: Secondary | ICD-10-CM | POA: Diagnosis not present

## 2023-09-19 DIAGNOSIS — I469 Cardiac arrest, cause unspecified: Secondary | ICD-10-CM | POA: Diagnosis not present

## 2023-09-19 DIAGNOSIS — R569 Unspecified convulsions: Secondary | ICD-10-CM | POA: Diagnosis not present

## 2023-09-19 DIAGNOSIS — Z515 Encounter for palliative care: Secondary | ICD-10-CM | POA: Diagnosis not present

## 2023-09-19 LAB — CBC WITH DIFFERENTIAL/PLATELET
Abs Immature Granulocytes: 0.1 K/uL — ABNORMAL HIGH (ref 0.00–0.07)
Basophils Absolute: 0.1 K/uL (ref 0.0–0.1)
Basophils Relative: 0 %
Eosinophils Absolute: 0.1 K/uL (ref 0.0–0.5)
Eosinophils Relative: 1 %
HCT: 35.3 % — ABNORMAL LOW (ref 36.0–46.0)
Hemoglobin: 11.2 g/dL — ABNORMAL LOW (ref 12.0–15.0)
Immature Granulocytes: 1 %
Lymphocytes Relative: 9 %
Lymphs Abs: 1.4 K/uL (ref 0.7–4.0)
MCH: 31.2 pg (ref 26.0–34.0)
MCHC: 31.7 g/dL (ref 30.0–36.0)
MCV: 98.3 fL (ref 80.0–100.0)
Monocytes Absolute: 1.3 K/uL — ABNORMAL HIGH (ref 0.1–1.0)
Monocytes Relative: 9 %
Neutro Abs: 12.3 K/uL — ABNORMAL HIGH (ref 1.7–7.7)
Neutrophils Relative %: 80 %
Platelets: 156 K/uL (ref 150–400)
RBC: 3.59 MIL/uL — ABNORMAL LOW (ref 3.87–5.11)
RDW: 17.7 % — ABNORMAL HIGH (ref 11.5–15.5)
WBC: 15.3 K/uL — ABNORMAL HIGH (ref 4.0–10.5)
nRBC: 0 % (ref 0.0–0.2)

## 2023-09-19 LAB — BASIC METABOLIC PANEL WITH GFR
Anion gap: 12 (ref 5–15)
BUN: 24 mg/dL — ABNORMAL HIGH (ref 6–20)
CO2: 24 mmol/L (ref 22–32)
Calcium: 9.1 mg/dL (ref 8.9–10.3)
Chloride: 96 mmol/L — ABNORMAL LOW (ref 98–111)
Creatinine, Ser: 3.77 mg/dL — ABNORMAL HIGH (ref 0.44–1.00)
GFR, Estimated: 13 mL/min — ABNORMAL LOW (ref >60)
Glucose, Bld: 99 mg/dL (ref 70–99)
Potassium: 4.2 mmol/L (ref 3.5–5.1)
Sodium: 132 mmol/L — ABNORMAL LOW (ref 135–145)

## 2023-09-19 LAB — GLUCOSE, CAPILLARY
Glucose-Capillary: 121 mg/dL — ABNORMAL HIGH (ref 70–99)
Glucose-Capillary: 83 mg/dL (ref 70–99)
Glucose-Capillary: 172 mg/dL — ABNORMAL HIGH (ref 70–99)

## 2023-09-19 MED ORDER — ACETAMINOPHEN 650 MG RE SUPP: 650.0000 mg | Freq: Four times a day (QID) | RECTAL | Status: DC | PRN

## 2023-09-19 MED ORDER — MIDODRINE HCL 5 MG PO TABS
10.0000 mg | ORAL_TABLET | Freq: Three times a day (TID) | ORAL | Status: DC
  Administered 2023-09-19: 10 mg
  Filled 2023-09-19: qty 2

## 2023-09-19 MED ORDER — ACETAMINOPHEN 325 MG PO TABS: 650.0000 mg | ORAL_TABLET | Freq: Four times a day (QID) | ORAL | Status: DC | PRN

## 2023-09-19 MED ORDER — ACETAMINOPHEN 650 MG RE SUPP
650.0000 mg | Freq: Four times a day (QID) | RECTAL | Status: DC | PRN
Start: 2023-09-19 — End: 2023-09-19

## 2023-09-19 MED ORDER — ACETAMINOPHEN 325 MG PO TABS
650.0000 mg | ORAL_TABLET | Freq: Four times a day (QID) | ORAL | Status: DC | PRN
Start: 2023-09-19 — End: 2023-09-19

## 2023-09-19 MED ORDER — MIDAZOLAM HCL 2 MG/2ML IJ SOLN
2.0000 mg | INTRAMUSCULAR | Status: DC | PRN
  Administered 2023-09-19: 2 mg via INTRAVENOUS
  Filled 2023-09-19 (×2): qty 2

## 2023-09-19 MED ORDER — DIPHENOXYLATE-ATROPINE 2.5-0.025 MG PO TABS
1.0000 | ORAL_TABLET | Freq: Four times a day (QID) | ORAL | Status: DC | PRN
  Administered 2023-09-19: 1
  Filled 2023-09-19: qty 1

## 2023-09-19 MED ORDER — GLYCOPYRROLATE 1 MG PO TABS
1.0000 mg | ORAL_TABLET | ORAL | Status: DC | PRN
  Filled 2023-09-19: qty 1

## 2023-09-19 MED ORDER — LEVETIRACETAM (KEPPRA) 500 MG/5 ML ADULT IV PUSH: 500.0000 mg | Freq: Two times a day (BID) | INTRAVENOUS | Status: DC

## 2023-09-19 MED ORDER — HYDROMORPHONE HCL-NACL 50-0.9 MG/50ML-% IV SOLN
0.0000 mg/h | INTRAVENOUS | Status: DC
  Administered 2023-09-19: 1 mg/h via INTRAVENOUS
  Filled 2023-09-19: qty 50

## 2023-09-19 MED ORDER — AMOXICILLIN-POT CLAVULANATE 500-125 MG PO TABS
1.0000 | ORAL_TABLET | Freq: Every day | ORAL | Status: DC
  Filled 2023-09-19: qty 1

## 2023-09-19 MED ORDER — HYDROMORPHONE BOLUS VIA INFUSION
1.0000 mg | INTRAVENOUS | Status: DC | PRN
  Administered 2023-09-19 (×2): 1 mg via INTRAVENOUS

## 2023-09-19 MED ORDER — GLYCOPYRROLATE 0.2 MG/ML IJ SOLN
0.2000 mg | INTRAMUSCULAR | Status: DC | PRN
  Administered 2023-09-19: 0.2 mg via INTRAVENOUS
  Filled 2023-09-19: qty 1

## 2023-09-19 MED ORDER — HALOPERIDOL LACTATE 5 MG/ML IJ SOLN: 2.5000 mg | INTRAMUSCULAR | Status: DC | PRN

## 2023-09-19 MED ORDER — GLYCOPYRROLATE 0.2 MG/ML IJ SOLN
0.2000 mg | INTRAMUSCULAR | Status: DC | PRN
Start: 2023-09-19 — End: 2023-09-20

## 2023-09-19 MED ORDER — POLYVINYL ALCOHOL 1.4 % OP SOLN
1.0000 [drp] | Freq: Four times a day (QID) | OPHTHALMIC | Status: DC | PRN
  Filled 2023-09-19: qty 15

## 2023-09-19 MED ORDER — VALPROATE SODIUM 100 MG/ML IV SOLN
500.0000 mg | Freq: Three times a day (TID) | INTRAVENOUS | Status: DC
  Administered 2023-09-19: 500 mg via INTRAVENOUS
  Filled 2023-09-19 (×3): qty 5

## 2023-09-21 LAB — CULTURE, BLOOD (ROUTINE X 2)
Culture: NO GROWTH
Culture: NO GROWTH
Special Requests: ADEQUATE

## 2023-09-27 NOTE — Progress Notes (Signed)
Per CCM order, RT extubated pt to comfort care/room air.

## 2023-09-27 NOTE — Progress Notes (Signed)
 Daily Progress Note   Patient Name: Holly [name]       Date: September 25, 2023 DOB: Jun 18, 1965  Age: 58 y.o. MRN#: [id number] Attending Physician: Jude Harden GAILS, MD Primary Care Physician: Pura Lenis, MD Admit Date: 09/16/2023  Reason for Consultation/Follow-up: Establishing goals of care  Subjective: I have reviewed medical records including EPIC notes, MAR, any available advanced directives as necessary, and labs. Received report from primary RN - no acute concerns. Received updates from neurology.   10:00-10:30 AM Went to visit patient and family at bedside for scheduled family meeting - husband, mother, and three children present. Patient was lying in bed - she is unresponsive off sedation. No signs or non-verbal gestures of pain or discomfort noted. No respiratory distress, increased work of breathing, or secretions noted. She is intubated and frail appearing.  Met with family in private 24M conference room. Emotional support provided to family. Therapeutic listening provided as they reflect on patient's interval history since admission. Family have been hopeful for improvement. Reviewed that today is day three of hospitalization and, unfortunately, she is showing no improvement. Discussed that per neurology, patient has poor prognosis and little chance for meaningful recovery. Family are understandably tearful.  Discussed options of continuing aggressive care (trach, PEG, need for LTC and likely out of state placement as she is also on HD) vs transition to full comfort care. Allowed space and time for family to discuss information in context of patient's wishes. Son is clear that patient expressed in the past she would not want to be kept alive artificially.   We talked about transition to  comfort measures in house and what that would entail inclusive of medications to control pain, dyspnea, agitation, nausea, and itching. We discussed stopping all unnecessary measures such as ventilator support, vasopressors, blood draws, needle sticks, oxygen, antibiotics, CBGs/insulin , cardiac monitoring, IVF, and frequent vital signs. Education provided that other non-pharmacological interventions would be utilized for holistic support and comfort such as spiritual support if requested, repositioning, music therapy, offering comfort feeds, and/or therapeutic listening. All care would focus on how the patient is looking and feeling.   Family are understandably tearful. There was one more son on the way to the hospital and family wished to wait and make final decisions after he arrives. Family ok for PMT to return at 11:00a.  11:00 AM Returned to conference room - husband, mother, and four children present. Family ask appropriate questions regarding prognosis after comfort care transition. Prognosis discussed to likely be minutes to hours after extubated. Discussed scheduling time for extubation to ensure family presence if they desired. Allowed space and time for family to discuss information - both sons reiterate patient's previously stated wishes not to be kept alive on machines. Family do not want to prolong patient on life support and would like to proceed with compassionate extubation today. All family members present are agreeable for patient's transition to full comfort today and they would like to be present. Code status will be updated to DNR.  Therapeutic listening provided as they reflect on what they would consider a peaceful death for their loved one would look like - family surrounding her bedside and not in pain.  Symptom management plan discussed in detail. Will start continuous opioid infusion prior to extubation. Family are in agreement.  Chaplain support offered - mother/Nancy would  appreciate emotional support.  All questions and concerns addressed. Encouraged to call with questions and/or concerns. PMT card provided.  Discussed symptom management plan with primary RN. [facility] would like to speak with family regarding organ donation. Will hold comfort care/extubation orders until family decide if they would like to pursue organ donation.  1:00 PM Notified by primary RN that family did not want to pursue organ donation. Comfort care and extubation orders placed - attending notified.  Discussed with pharmacist appropriate dose adjustment for valproate and keppra  from per tube to IV.   Length of Stay: 3  Current Medications: Scheduled Meds:   amoxicillin -clavulanate  1 tablet Per Tube Q2000   Chlorhexidine  Gluconate Cloth  6 each Topical Daily   enoxaparin  (LOVENOX ) injection  30 mg Subcutaneous Q24H   famotidine   10 mg Per Tube Daily   levETIRAcetam   500 mg Per Tube BID   midazolam   2 mg Intravenous Once   midodrine   10 mg Per Tube Q8H   mouth rinse  15 mL Mouth Rinse Q2H   valproic  acid  500 mg Per Tube Q8H    Continuous Infusions:  feeding supplement (OSMOLITE 1.5 CAL) 25 mL/hr at 10/17/23 0900   norepinephrine  (LEVOPHED ) Adult infusion Stopped (2023/10/17 0815)    PRN Meds: acetaminophen  **OR** acetaminophen  (TYLENOL ) oral liquid 160 mg/5 mL **OR** acetaminophen , diphenoxylate -atropine , ondansetron  (ZOFRAN ) IV, mouth rinse  Physical Exam Vitals and nursing note reviewed.  Constitutional:      General: She is not in acute distress.    Interventions: She is intubated.  Pulmonary:     Effort: No respiratory distress. She is intubated.  Skin:    General: Skin is warm and dry.  Neurological:     Mental Status: She is unresponsive.             Vital Signs: BP (!) 114/45   Pulse 64   Temp 98.7 F (37.1 C) (Oral)   Resp 18   Ht 5' 7 (1.702 m)   Wt 80.6 kg   LMP 07/17/2011   SpO2 100%   BMI 27.83 kg/m  SpO2: SpO2: 100 % O2 Device: O2  Device: Ventilator O2 Flow Rate:    Intake/output summary:  Intake/Output Summary (Last 24 hours) at 2023/10/17 0942 Last data filed at 2023-10-17 0900 Gross per 24 hour  Intake 1232.18 ml  Output --  Net 1232.18 ml   LBM: Last BM Date : 10/17/2023 Baseline Weight: Weight: 63.5 kg Most recent weight: Weight: 80.6 kg  Palliative Assessment/Data: PPS 30% with tube feeds      Patient Active Problem List   Diagnosis Date Noted   Palliative care encounter 09/18/2023   Pressure injury of skin 09/18/2023   Cardiac arrest (HCC) 09/16/2023   Atrial fibrillation with RVR (HCC) 04/24/2021   ESRD on dialysis (HCC) 04/24/2021   Atrial fibrillation, new onset (HCC) 04/18/2021   Acute urinary retention 04/15/2021   Deficiency anemia 11/15/2018   Leukopenia 11/08/2018   History of transmetatarsal amputation of left foot (HCC)    Fever 11/06/2018   UTI (urinary tract infection) 01/30/2015   Acute renal failure superimposed on stage 4 chronic kidney disease (HCC) 01/28/2015   Foot abscess, left 01/27/2015   Type 2 diabetes mellitus with chronic kidney disease, with long-term current use of insulin  (HCC)    Cellulitis of left foot 01/24/2015   Thrombocytopenia (HCC) 01/24/2015   Dehydration, moderate 01/24/2015   CKD (chronic kidney disease), stage V (HCC) 01/24/2015   Type 2 diabetes mellitus with left diabetic foot ulcer (HCC) 12/24/2014   Type 2 diabetes mellitus with right diabetic foot ulcer (HCC) 12/24/2014   Type II diabetes mellitus with neurological manifestations (HCC) 12/24/2014   Acute renal failure superimposed on stage 3 chronic kidney disease (HCC) 03/29/2014   Neurogenic bladder/suspected 03/29/2014   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 female with multiple medical problems including ESRD on hemodialysis, diabetes type 2, hypertension, and hyperlipidemia, who was admitted to the hospital on 09/16/2023 after having a PEA arrest at rehab center.  Unfortunately, patient sustained anoxic injury.  Palliative care was consulted to address goals.   Assessment: Principal Problem:   Cardiac arrest [facility]) Active Problems:   Palliative care encounter   Pressure injury of skin   Terminal care  Recommendations/Plan: Initiated full comfort measures. Plan for compassionate extubation today Now DNR- comfort Anticipate hospital death  Added orders for EOL symptom management and to reflect full comfort measures, as well as discontinued orders that were not focused on comfort Unrestricted visitation orders were placed per current [place] EOL visitation policy  Nursing to provide frequent assessments and administer PRN medications as clinically necessary to ensure EOL comfort Continue palliative wound care Chaplain notified and consulted for: emotional support for patient's mother PMT will continue to follow and support holistically  Symptom Management Continuous dilaudid  infusion; PRN doses for breakthrough pain/dyspnea/increased work of breathing/RR>25 Tylenol  PRN pain/fever Robinul  PRN secretions Haldol  PRN agitation/delirium Versed  PRN anxiety/seizure/sleep/distress Liquifilm Tears PRN dry eye Continue keppra  and valproate for seizure prophylaxis    Goals of Care and Additional Recommendations: Limitations on Scope of Treatment: Full Comfort Care  Code Status:    Code Status Orders  (From admission, onward)           Start     Ordered   09/16/23 0802  Full code   Continuous       Question:  By:  Answer:  Consent: discussion documented in EHR   09/16/23 0802           Code Status History  Date Active Date Inactive Code Status Order ID Comments User Context   04/24/2021 1555 04/25/2021 2316 Full Code [id number]  Barbarann Nest, MD ED   04/16/2021 0020 04/20/2021 2016 Full Code [id number]  Tobie Jorie SAUNDERS, MD ED   11/06/2018 0216 11/08/2018 1400 Full Code [id number]  Leopold Damien NOVAK, MD ED   01/26/2015 1125 02/03/2015 1806 Full Code [id number]  Harden Jerona GAILS, MD Inpatient   01/24/2015 1554 01/26/2015 1125 Full Code [id number]  Alto Isaiah CROME, NP Inpatient   03/29/2014 1847 03/31/2014 1450 Full Code [id number]  Alto Isaiah CROME, NP Inpatient   07/19/2011 2231 07/20/2011 1811 Full Code [id number]  Barbette Robbi SAUNDERS, MD Inpatient       Prognosis:  Minutes- hours  Discharge Planning: Anticipated Hospital Death  Care plan was discussed with primary RN, patient's family, Dr. Jude, [facility]  Thank you for allowing the Palliative Medicine Team to assist in the care of this patient.   Total Time 90 minutes Prolonged Time Billed  yes       Jeoffrey CHRISTELLA Sharps, NP  Please contact Palliative Medicine Team phone at [phone] for questions and concerns.   *Portions of this note are a verbal dictation therefore any spelling and/or grammatical errors are due to the Dragon Medical One system interpretation.

## 2023-09-27 NOTE — Progress Notes (Signed)
 Chaplain responded to spiritual consult and page for support to Holly [name]'s mother Holly [name]. We spent about an hour exploring her emotions and thoughts regarding her daughter. I provided space for story sharing, prayer, theological discussion, grief education, and encouragement. Chaplains remain available as needs arise.

## 2023-09-27 NOTE — Progress Notes (Signed)
 eLink Physician-Brief Progress Note Patient Name: Holly [name] DOB: 21-Jun-1965 MRN: [id number]   Date of Service  10/08/2023  HPI/Events of Note  Frequent stools, does not meet criteria for C. difficile testing.  Stable leukocytosis.  No new fever.  eICU Interventions  Lomotil  as needed     Intervention Category Minor Interventions: Routine modifications to care plan (e.g. PRN medications for pain, fever)  Christyna Letendre 10/08/23, 4:06 AM

## 2023-09-27 NOTE — Progress Notes (Signed)
 NEUROLOGY CONSULT FOLLOW UP NOTE   Date of service: Sep 30, 2023 Patient Name: Holly [name] MRN:  [id number] DOB:  [birth year]  Interval Hx/subjective   No changes.   Vitals   Vitals:   2023-09-30 0630 09/30/23 0645 09/30/2023 0700 09/30/23 0738  BP: (!) 119/45 (!) 117/47 (!) 116/45   Pulse: 66 66 65   Resp: 18 18 18    Temp:    98.7 F (37.1 C)  TempSrc:    Oral  SpO2: 100% 100% 100%   Weight:      Height:         Body mass index is 27.83 kg/m.  Physical Exam   Intubated   Neurologic Examination    MS: Does not open eyes or follow commands CN: Pupils are reactive, corneals intact, no cough Motor: extension to noxious sitmulation bilateral arms, triple flexion in bilateral legs.  Sensory: As above  Medications  Current Facility-Administered Medications:    acetaminophen  (TYLENOL ) tablet 650 mg, 650 mg, Oral, Q4H PRN **OR** acetaminophen  (TYLENOL ) 160 MG/5ML solution 650 mg, 650 mg, Per Tube, Q4H PRN, 650 mg at 09/18/23 1137 **OR** acetaminophen  (TYLENOL ) suppository 650 mg, 650 mg, Rectal, Q4H PRN, Harold Scholz, MD   Ampicillin -Sulbactam (UNASYN ) 3 g in sodium chloride  0.9 % 100 mL IVPB, 3 g, Intravenous, Q12H, Harold Scholz, MD, Stopped at 2023/09/30 0210   Chlorhexidine  Gluconate Cloth 2 % PADS 6 each, 6 each, Topical, Daily, Chand, Scholz, MD, 6 each at 09/17/23 2306   diphenoxylate -atropine  (LOMOTIL ) 2.5-0.025 MG per tablet 1 tablet, 1 tablet, Per Tube, QID PRN, Paliwal, Aditya, MD, 1 tablet at 2023/09/30 0438   enoxaparin  (LOVENOX ) injection 30 mg, 30 mg, Subcutaneous, Q24H, Chand, Sudham, MD, 30 mg at 09/18/23 1807   famotidine  (PEPCID ) tablet 10 mg, 10 mg, Per Tube, Daily, Alva, Rakesh V, MD, 10 mg at 09/18/23 9090   feeding supplement (OSMOLITE 1.5 CAL) liquid 1,000 mL, 1,000 mL, Per Tube, Continuous, Jude Harden GAILS, MD, Last Rate: 25 mL/hr at 2023-09-30 0700, Infusion Verify at 2023-09-30 0700   insulin  aspart (novoLOG ) injection 0-9 Units, 0-9 Units, Subcutaneous,  Q4H, Alva, Rakesh V, MD, 1 Units at 30-Sep-2023 0327   levETIRAcetam  (KEPPRA ) tablet 500 mg, 500 mg, Per Tube, BID, Alva, Rakesh V, MD, 500 mg at 09/18/23 2126   midazolam  (VERSED ) injection 2 mg, 2 mg, Intravenous, Once, Chand, Sudham, MD   midodrine  (PROAMATINE ) tablet 10 mg, 10 mg, Per Tube, Q8H, Alva, Rakesh V, MD, 10 mg at 09/30/23 0759   norepinephrine  (LEVOPHED ) 4mg  in (0.016 mg/mL) premix infusion, 0-10 mcg/min, Intravenous, Titrated, Jude Harden V, MD, Last Rate: 2.63 mL/hr at 2023-09-30 0700, 0.7 mcg/min at 2023-09-30 0700   ondansetron  (ZOFRAN ) injection 4 mg, 4 mg, Intravenous, Q6H PRN, Harold Scholz, MD   Oral care mouth rinse, 15 mL, Mouth Rinse, Q2H, Chand, Sudham, MD, 15 mL at 2023/09/30 0759   Oral care mouth rinse, 15 mL, Mouth Rinse, PRN, Chand, Sudham, MD   valproic  acid (DEPAKENE ) 250 MG/5ML solution 500 mg, 500 mg, Per Tube, Q8H, Jude Harden GAILS, MD, 500 mg at 2023-09-30 0516  Labs and Diagnostic Imaging   Imaging(Personally reviewed): CT head-I do feel that the deep structures are indistinct with blurring of the gray-white junction   Assessment   Holly [name] is a 58 y.o. female with anoxic brain injury.  As evidenced by early myoclonus, super refractory myoclonic status, poor exam(no motor response on day three, extension on day 4), I feel that at this point  we can say that her overall prognosis is extremely poor and she does not have any significant chance of recovery to an independent level of functioning.   Recommendations  Discussions with family regarding goals of care Discontinue EEG monitoring.  Continue Depakote and Keppra  Neurology will be available as needed for family discussion, but will only be available as needed.  ______________________________________________________________________  This patient is critically ill and at significant risk of neurological worsening, death and care requires constant monitoring of vital signs, hemodynamics,respiratory and  cardiac monitoring, neurological assessment, discussion with family, other specialists and medical decision making of high complexity. I spent 33 minutes of neurocritical care time  in the care of  this patient. This was time spent independent of any time provided by nurse practitioner or PA.  Holly Seals, MD [facility]   If 7pm- 7am, please page neurology on call as listed in AMION. 10/19/23  8:18 AM

## 2023-09-27 NOTE — Discharge Summary (Signed)
   DEATH SUMMARY   Patient Details  Name: Holly [name] MRN: [id number] DOB: 1965/03/09 ERE:Anldxj, Alm, MD  Admission/Discharge Information   Admit Date:  10/10/23  Date of Death: Date of Death: 13-Oct-2023  Time of Death: Time of Death: 10-21-18  Length of Stay: 3   Principle Cause of death: Acute coronary syndrome  Hospital Diagnoses: Principal Problem:   Cardiac arrest [facility]) Active Problems:   Palliative care encounter   Pressure injury of skin  History of Present Illness:  58 year old female with end-stage renal disease on hemodialysis, diabetes type 2, hypertension, hyperlipidemia was brought into the emergency department from skilled care nursing facility status post PEA cardiac arrest.  Per nursing report patient called out to nursing home staff due to increasing shortness of breath, they attempted to put oxygen on her but she collapsed falling face forward, noted to be in asystole, CPR was initiated, she received 3 rounds of epinephrine, given calcium  and bicarbonate.  She was intubated and was brought into the emergency department   On evaluation patient is having generalized myoclonic jerking     Hospital Course: Significant Hospital Events: Including procedures, antibiotic start and stop dates in addition to other pertinent events   Oct 10, 2023 myoclonic seizures postadmission >> LTM EEG, loaded with Keppra  and Depakote 8/22 GOC discussion >> continue full medical care, family indicates they would not want prolonged life support 8/23 seizure stopped, DC propofol   Assessment and Plan:   Status post PEA cardiac arrest, likely hypoxia induced Generalized status myoclonus, due to anoxic brain injury Anoxic encephalopathy   EEG initially showed burst suppression with highly epileptiform myoclonic bursts, generalized 8/23 improved to high amplitude slow waves   - Continue supportive care , DC LTM EEG - avoid fevers - Continue Keppra  and Depakote , off propofol  for 24  hours and neuroexam remains poor - Poor prognosis for meaningful neurologic recovery has been conveyed to family - Add midodrine  to facilitate coming off Levophed      Acute respiratory failure with hypoxia Probable aspiration pneumonia   - Changed to Augmentin  -Continue full vent support     End-stage renal disease on hemodialysis Hypervolemic hyponatremia - HD per renal   Diabetes type 2   -dc SSI , avoid hypoglycemia Anemia of renal disease   -Monitor   Guarded prognosis for meaningful neurologic recovery was conveyed to family, palliative care conversation occurred. She was extubated to comfort care & passed away peacefully.    Signed: Harden ROCKFORD Jude, MD

## 2023-09-27 NOTE — Procedures (Addendum)
 Patient Name: Holly [name]  MRN: [id number]  Epilepsy Attending: Arlin MALVA Krebs  Referring Physician/Provider: Krebs Arlin MALVA, MD  Duration: 09/18/2023 1146 to 10/18/23 9177   Patient history: 58yo F s/p cardiac arrest. EEG to evaluate for seizure   Level of alertness: comatose   AEDs during EEG study: Propofol , VPA, LEV   Technical aspects: This EEG study was done with scalp electrodes positioned according to the 10-20 International system of electrode placement. Electrical activity was reviewed with band pass filter of 1-70Hz , sensitivity of 7 uV/mm, display speed of 42mm/sec with a 60Hz  notched filter applied as appropriate. EEG data were recorded continuously and digitally stored.  Video monitoring was available and reviewed as appropriate.   Description: EEG initially showed continuous high amplitude sharply contoured 3-5hz  theta-delta slowing. Gradually EEG evolved into generalized background suppression. Hyperventilation and photic stimulation were not performed.      ABNORMALITY - Continuous slow, generalized - Background suppression, generalized   IMPRESSION: This study was suggestive of severe diffuse encephalopathy which gradually worsened to profound diffuse encephalopathy. No seizures were noted.     Holly [name]

## 2023-09-27 NOTE — Progress Notes (Signed)
 Pt transitioned to Comfort care. Pt Asystole on heart monitor. Family at bedside. Heart and lungs auscultated. Pt pronounced by Bobbette Maclachlan, RN and Charlanne Ernst, RN. Dr. Jude notified time of death 10/06/1718

## 2023-09-27 NOTE — Progress Notes (Signed)
 [facility] Progress Note  Subjective:  Seen in ICU, not responsive  Vitals:   09-20-2023 0645 20-Sep-2023 0700 Sep 20, 2023 0738 09/20/2023 0759  BP: (!) 117/47 (!) 116/45    Pulse: 66 65  67  Resp: 18 18  18   Temp:   98.7 F (37.1 C)   TempSrc:   Oral   SpO2: 100% 100%    Weight:      Height:        Exam: Gen on vent, not responding Sclera anicteric, throat w/ ETT No jvd or bruits Chest clear anterior/ lateral RRR no MRG Abd soft ntnd no mass or ascites +bs Ext 1-2+ bilat LE/UE edema Neuro is on vent    RUA AVF+bruit    Home bp meds: Midodrine  10mg  tid     [facility]  B400   69.5kg   2K bath   AVF   Heparin  2500 Last HD 8/09 at OP unit, post wt 73.5kg Last HD inpatient 8/19 prob at [facility] (in [place])       Assessment/ Plan: S/P PEA cardiac arrest: w/ status myoclonus, possible anoxic brain injury. Per neurology poor prognosis.  Aspiration PNA/ acute resp failure: on vent per CCM ESRD: on HD TTS. Had HD here Friday night. B/Cr are low. Holding plans for RRT pending GOC today.  Hypotension: on levo gtt Volume: hypervolemic, 2.5 L off w/ HD Friday.  Anemia of esrd: Hb 9-11, follow.    Myer Fret MD  [facility] September 20, 2023, 8:33 AM  Recent Labs  Lab 09/16/23 0522 09/16/23 0535 09/17/23 0515 09/18/23 0852 09/20/2023 0426  HGB 9.2*   < > 10.1* 11.6* 11.2*  ALBUMIN  2.7*  --   --   --   --   CALCIUM  10.0  --  9.5 9.2 9.1  PHOS  --   --  3.2 2.6  --   CREATININE 3.91*   < > 4.33* 3.25* 3.77*  K 3.9   < > 3.9 3.8 4.2   < > = values in this interval not displayed.   No results for input(s): IRON, TIBC, FERRITIN in the last 168 hours. Inpatient medications:  Chlorhexidine  Gluconate Cloth  6 each Topical Daily   enoxaparin  (LOVENOX ) injection  30 mg Subcutaneous Q24H   famotidine   10 mg Per Tube Daily   insulin  aspart  0-9 Units Subcutaneous Q4H   levETIRAcetam   500 mg Per Tube BID   midazolam   2 mg Intravenous Once   midodrine   10 mg  Per Tube Q8H   mouth rinse  15 mL Mouth Rinse Q2H   valproic  acid  500 mg Per Tube Q8H    ampicillin -sulbactam (UNASYN ) IV Stopped (09/20/23 0210)   feeding supplement (OSMOLITE 1.5 CAL) 25 mL/hr at 2023/09/20 0700   norepinephrine  (LEVOPHED ) Adult infusion 0.7 mcg/min (09/20/23 0700)   acetaminophen  **OR** acetaminophen  (TYLENOL ) oral liquid 160 mg/5 mL **OR** acetaminophen , diphenoxylate -atropine , ondansetron  (ZOFRAN ) IV, mouth rinse

## 2023-09-27 NOTE — Progress Notes (Signed)
 LTM maint complete - no skin breakdown seen. [facility] monitored, Event button test confirmed by [facility].

## 2023-09-27 NOTE — Progress Notes (Signed)
 Dilaudid  IV gtt wasted 25ml in stericycle by Bobbette Maclachlan, RN Witness by Millard Puffer, RN

## 2023-09-27 NOTE — Progress Notes (Addendum)
 NAME:  Holly [name], MRN:  [id number], DOB:  01/18/1966, LOS: 3 ADMISSION DATE:  09/16/2023, CONSULTATION DATE: 09/16/2023 REFERRING MD:  Haze Millman , CHIEF COMPLAINT: Status postcardiac arrest  History of Present Illness:  58 year old female with end-stage renal disease on hemodialysis, diabetes type 2, hypertension, hyperlipidemia was brought into the emergency department from skilled care nursing facility status post PEA cardiac arrest.  Per nursing report patient called out to nursing home staff due to increasing shortness of breath, they attempted to put oxygen on her but she collapsed falling face forward, noted to be in asystole, CPR was initiated, she received 3 rounds of epinephrine, given calcium  and bicarbonate.  She was intubated and was brought into the emergency department  On evaluation patient is having generalized myoclonic jerking  Pertinent  Medical History   Past Medical History:  Diagnosis Date   Anxiety state, unspecified    Background diabetic retinopathy(362.01)    Bulimia    Calculus of kidney    Cellulitis and abscess of foot 01/24/2015   left foot   Chronic inflammatory demyelinating polyneuritis (HCC)    Chronic kidney disease, stage IV (severe) (HCC)    DM2 (diabetes mellitus, type 2) (HCC)    Dysthymic disorder    Esophageal reflux    Essential hypertension, benign    Heart murmur    been told very slight, never given her any problems   History of blood transfusion X 6-7; 2010 - present (03/29/2014)   related to OR; kidney issues   HLD (hyperlipidemia)    HDL goal >50, LDL goal <100   Iron deficiency anemia    suppose to get procrit  injections q 3 wks; usually don't do it (03/29/2014)   Irritable bowel syndrome    Left heart failure (HCC)    Primary pulmonary hypertension (HCC)    not seen at CATH  (35mm Hg), pt not aware of this   RLS (restless legs syndrome)      Significant Hospital Events: Including procedures, antibiotic start  and stop dates in addition to other pertinent events   8/21 myoclonic seizures postadmission >> LTM EEG, loaded with Keppra  and Depakote 8/22 GOC discussion >> continue full medical care, family indicates they would not want prolonged life support 8/23 seizure stopped, DC propofol   Interim History / Subjective:  Critically ill, intubated Remains unresponsive off sedation On very low-dose Levophed  Afebrile    Objective    Blood pressure (!) 116/45, pulse 67, temperature 98.7 F (37.1 C), temperature source Oral, resp. rate 18, height 5' 7 (1.702 m), weight 80.6 kg, last menstrual period 07/17/2011, SpO2 100%.    Vent Mode: PRVC FiO2 (%):  [40 %] 40 % Set Rate:  [18 bmp] 18 bmp Vt Set:  [490 mL] 490 mL PEEP:  [5 cmH20] 5 cmH20 Plateau Pressure:  [21 cmH20-28 cmH20] 21 cmH20   Intake/Output Summary (Last 24 hours) at September 29, 2023 0911 Last data filed at 29-Sep-2023 0700 Gross per 24 hour  Intake 1176.92 ml  Output --  Net 1176.92 ml   Filed Weights   09/17/23 2200 09/18/23 0500 2023-09-29 0145  Weight: 81.6 kg 80.5 kg 80.6 kg    Examination: General: Crtitically ill-appearing female, orally intubated HEENT: , [place]/AT, eyes anicteric.  ETT and GT in place Neuro: RASS -1, no response to deep pain stimulus, corneals sluggish, doll's eye present, withdrawal extremities to painful stimulus Chest: Bilateral ventilated breath sounds, no accessory muscle use Heart: S1-S2 regular Abdomen: Soft, nondistended, bowel sounds present  Repeat  head CT 8/22 shows loss of gray-white differentiation CT angio chest negative for PE, cardiomegaly with bilateral effusions, bilateral lower lobe consolidation consistent with aspiration  Labs show hyponatremia, mild leukocytosis   Resolved problem list   Assessment and Plan  Status post PEA cardiac arrest, likely hypoxia induced Generalized status myoclonus, due to anoxic brain injury Anoxic encephalopathy  EEG initially showed burst suppression  with highly epileptiform myoclonic bursts, generalized 8/23 improved to high amplitude slow waves  - Continue supportive care , DC LTM EEG - avoid fevers - Continue Keppra  and Depakote , off propofol  for 24 hours and neuroexam remains poor - Poor prognosis for meaningful neurologic recovery has been conveyed to family - Add midodrine  to facilitate coming off Levophed    Acute respiratory failure with hypoxia Probable aspiration pneumonia  - Changed to Augmentin  -Continue full vent support   End-stage renal disease on hemodialysis Hypervolemic hyponatremia - HD per renal  Diabetes type 2  -dc SSI , avoid hypoglycemia Anemia of renal disease  -Monitor  Guarded prognosis for meaningful neurologic recovery has been conveyed to family, palliative care conversation planned for today.  They have indicated that they would not want prolonged life support if neuroprognosis were definite.  I think at 72 hours we can more definitively confirm a poor neuroprognosis  Best Practice (right click and Reselect all SmartList Selections daily)   Diet/type: NPO DVT prophylaxis systemic heparin  Pressure ulcer(s): Please see nursing notes GI prophylaxis: H2B Lines: N/A Foley:  N/A Code Status:  full code Last date of multidisciplinary goals of care discussion [8/22: Patient's husband and daughter both were updated]  Labs   CBC: Recent Labs  Lab 09/16/23 0522 09/16/23 0535 09/16/23 0618 09/16/23 1105 09/17/23 0515 09/18/23 0852 09-27-2023 0426  WBC 5.6  --   --  10.7* 14.4* 13.0* 15.3*  NEUTROABS 3.9  --   --   --   --  9.9* 12.3*  HGB 9.2*   < > 9.5* 9.1* 10.1* 11.6* 11.2*  HCT 30.9*   < > 28.0* 28.7* 31.8* 36.7 35.3*  MCV 103.7*  --   --  99.0 98.1 99.7 98.3  PLT 176  --   --  185 208 164 156   < > = values in this interval not displayed.    Basic Metabolic Panel: Recent Labs  Lab 09/16/23 0522 09/16/23 0535 09/16/23 0618 09/16/23 1105 09/17/23 0515 09/18/23 0852  09-27-2023 0426  NA 128* 127* 127*  --  131* 129* 132*  K 3.9 4.0 3.8  --  3.9 3.8 4.2  CL 91* 95*  --   --  95* 92* 96*  CO2 22  --   --   --  20* 24 24  GLUCOSE 254* 256*  --   --  146* 218* 99  BUN 23* 27*  --   --  29* 21* 24*  CREATININE 3.91* 3.90*  --  4.08* 4.33* 3.25* 3.77*  CALCIUM  10.0  --   --   --  9.5 9.2 9.1  MG 2.3  --   --   --  2.1 1.9  --   PHOS  --   --   --   --  3.2 2.6  --    GFR: Estimated Creatinine Clearance: 17.3 mL/min (A) (by C-G formula based on SCr of 3.77 mg/dL (H)). Recent Labs  Lab 09/16/23 1105 09/17/23 0515 09/18/23 0852 09/27/23 0426  WBC 10.7* 14.4* 13.0* 15.3*    Liver Function Tests: Recent Labs  Lab 09/16/23 0522  AST 36  ALT 13  ALKPHOS 140*  BILITOT 1.0  PROT 5.4*  ALBUMIN  2.7*   No results for input(s): LIPASE, AMYLASE in the last 168 hours. No results for input(s): AMMONIA in the last 168 hours.  ABG    Component Value Date/Time   PHART 7.410 09/16/2023 0618   PCO2ART 38.1 09/16/2023 0618   PO2ART 108 09/16/2023 0618   HCO3 24.6 09/16/2023 0618   TCO2 26 09/16/2023 0618   O2SAT 99 09/16/2023 0618     Coagulation Profile: No results for input(s): INR, PROTIME in the last 168 hours.  Cardiac Enzymes: No results for input(s): CKTOTAL, CKMB, CKMBINDEX, TROPONINI in the last 168 hours.  HbA1C: Hgb A1c MFr Bld  Date/Time Value Ref Range Status  09/16/2023 11:05 AM 4.6 (L) 4.8 - 5.6 % Final    Comment:    (NOTE) Diagnosis of Diabetes The following HbA1c ranges recommended by the American Diabetes Association (ADA) may be used as an aid in the diagnosis of diabetes mellitus.  Hemoglobin             Suggested A1C NGSP%              Diagnosis  <5.7                   Non Diabetic  5.7-6.4                Pre-Diabetic  >6.4                   Diabetic  <7.0                   Glycemic control for                       adults with diabetes.    04/16/2021 06:15 AM 6.3 (H) 4.8 - 5.6 % Final     Comment:    (NOTE) Pre diabetes:          5.7%-6.4%  Diabetes:              >6.4%  Glycemic control for   <7.0% adults with diabetes     CBG: Recent Labs  Lab 09/18/23 1518 09/18/23 1918 09/18/23 2313 Sep 27, 2023 0323 2023/09/27 0718  GLUCAP 192* 132* 140* 121* 83       Critical care time: 60 m     The patient is critically ill due to status post PEA cardiac arrest/acute respiratory failure with hypoxia.  Critical care was necessary to treat or prevent imminent or life-threatening deterioration.  Critical care was time spent personally by me on the following activities: development of treatment plan with patient and/or surrogate as well as nursing, discussions with consultants, evaluation of patient's response to treatment, examination of patient, obtaining history from patient or surrogate, ordering and performing treatments and interventions, ordering and review of laboratory studies, ordering and review of radiographic studies, pulse oximetry, re-evaluation of patient's condition and participation in multidisciplinary rounds.       Harden ROCKFORD Jude  MD [facility] See Amion for pager If no response to pager, please call [phone] until 7pm After 7pm, Please call E-link [phone]

## 2023-09-27 NOTE — Progress Notes (Signed)
 LTM VIDEO EEG discontinued - no skin breakdown at [facility].

## 2023-09-27 NOTE — Progress Notes (Signed)
 Late note entry 09/20/23, 9:35AM Contacted op [facility], [place] to inform of pt passing.   Mayrene Bastarache Dialysis Navigator

## 2023-09-27 DEATH — deceased
